# Patient Record
Sex: Male | Born: 1940 | Race: Asian | Hispanic: No | Marital: Married | Smoking: Former smoker
Health system: Southern US, Community
[De-identification: ages and names within clinical notes are randomized; demographics above are authoritative.]

## PROBLEM LIST (undated history)

## (undated) DIAGNOSIS — D649 Anemia, unspecified: Secondary | ICD-10-CM

## (undated) DIAGNOSIS — I639 Cerebral infarction, unspecified: Secondary | ICD-10-CM

## (undated) DIAGNOSIS — H548 Legal blindness, as defined in USA: Secondary | ICD-10-CM

## (undated) DIAGNOSIS — H9190 Unspecified hearing loss, unspecified ear: Secondary | ICD-10-CM

## (undated) DIAGNOSIS — F1010 Alcohol abuse, uncomplicated: Secondary | ICD-10-CM

## (undated) DIAGNOSIS — F0390 Unspecified dementia without behavioral disturbance: Secondary | ICD-10-CM

## (undated) DIAGNOSIS — E1129 Type 2 diabetes mellitus with other diabetic kidney complication: Secondary | ICD-10-CM

## (undated) DIAGNOSIS — H269 Unspecified cataract: Secondary | ICD-10-CM

## (undated) DIAGNOSIS — I10 Essential (primary) hypertension: Secondary | ICD-10-CM

## (undated) HISTORY — DX: Cerebral infarction, unspecified: I63.9

## (undated) HISTORY — DX: Type 2 diabetes mellitus with other diabetic kidney complication: E11.29

## (undated) HISTORY — DX: Anemia, unspecified: D64.9

## (undated) HISTORY — DX: Unspecified cataract: H26.9

## (undated) HISTORY — DX: Unspecified dementia, unspecified severity, without behavioral disturbance, psychotic disturbance, mood disturbance, and anxiety: F03.90

---

## 2013-11-15 ENCOUNTER — Observation Stay (HOSPITAL_COMMUNITY)
Admission: EM | Admit: 2013-11-15 | Discharge: 2013-11-16 | Disposition: A | Discharge status: Home Health | Payer: Medicare Other | Attending: General Surgery | Admitting: General Surgery

## 2013-11-15 ENCOUNTER — Encounter (HOSPITAL_COMMUNITY): Payer: Self-pay | Admitting: Emergency Medicine

## 2013-11-15 ENCOUNTER — Observation Stay (HOSPITAL_COMMUNITY): Payer: Medicare Other

## 2013-11-15 ENCOUNTER — Emergency Department (HOSPITAL_COMMUNITY): Payer: Medicare Other

## 2013-11-15 DIAGNOSIS — F1010 Alcohol abuse, uncomplicated: Secondary | ICD-10-CM | POA: Diagnosis not present

## 2013-11-15 DIAGNOSIS — S0990XA Unspecified injury of head, initial encounter: Secondary | ICD-10-CM | POA: Diagnosis present

## 2013-11-15 DIAGNOSIS — S069XAA Unspecified intracranial injury with loss of consciousness status unknown, initial encounter: Secondary | ICD-10-CM

## 2013-11-15 DIAGNOSIS — Y92009 Unspecified place in unspecified non-institutional (private) residence as the place of occurrence of the external cause: Secondary | ICD-10-CM

## 2013-11-15 DIAGNOSIS — I10 Essential (primary) hypertension: Secondary | ICD-10-CM | POA: Diagnosis not present

## 2013-11-15 DIAGNOSIS — S065X0A Traumatic subdural hemorrhage without loss of consciousness, initial encounter: Secondary | ICD-10-CM | POA: Diagnosis not present

## 2013-11-15 DIAGNOSIS — S065X9A Traumatic subdural hemorrhage with loss of consciousness of unspecified duration, initial encounter: Secondary | ICD-10-CM

## 2013-11-15 DIAGNOSIS — S065XAA Traumatic subdural hemorrhage with loss of consciousness status unknown, initial encounter: Secondary | ICD-10-CM

## 2013-11-15 DIAGNOSIS — F10129 Alcohol abuse with intoxication, unspecified: Secondary | ICD-10-CM | POA: Diagnosis present

## 2013-11-15 DIAGNOSIS — S069X9A Unspecified intracranial injury with loss of consciousness of unspecified duration, initial encounter: Secondary | ICD-10-CM

## 2013-11-15 DIAGNOSIS — W19XXXA Unspecified fall, initial encounter: Secondary | ICD-10-CM | POA: Diagnosis not present

## 2013-11-15 DIAGNOSIS — F10929 Alcohol use, unspecified with intoxication, unspecified: Secondary | ICD-10-CM

## 2013-11-15 LAB — CBC WITH DIFFERENTIAL/PLATELET
BASOS PCT: 0 % (ref 0–1)
Basophils Absolute: 0 10*3/uL (ref 0.0–0.1)
EOS PCT: 2 % (ref 0–5)
Eosinophils Absolute: 0.2 10*3/uL (ref 0.0–0.7)
HEMATOCRIT: 36.3 % — AB (ref 39.0–52.0)
HEMOGLOBIN: 12.4 g/dL — AB (ref 13.0–17.0)
Lymphocytes Relative: 18 % (ref 12–46)
Lymphs Abs: 1.5 10*3/uL (ref 0.7–4.0)
MCH: 27.1 pg (ref 26.0–34.0)
MCHC: 34.2 g/dL (ref 30.0–36.0)
MCV: 79.3 fL (ref 78.0–100.0)
MONO ABS: 0.7 10*3/uL (ref 0.1–1.0)
MONOS PCT: 8 % (ref 3–12)
Neutro Abs: 6.1 10*3/uL (ref 1.7–7.7)
Neutrophils Relative %: 72 % (ref 43–77)
Platelets: 144 10*3/uL — ABNORMAL LOW (ref 150–400)
RBC: 4.58 MIL/uL (ref 4.22–5.81)
RDW: 14.1 % (ref 11.5–15.5)
WBC: 8.5 10*3/uL (ref 4.0–10.5)

## 2013-11-15 LAB — URINALYSIS, ROUTINE W REFLEX MICROSCOPIC
Bilirubin Urine: NEGATIVE
Glucose, UA: >1000 mg/dL — AB
Ketones, ur: NEGATIVE mg/dL
NITRITE: NEGATIVE
Protein, ur: 30 mg/dL — AB
Specific Gravity, Urine: 1.011 (ref 1.005–1.030)
Urobilinogen, UA: 0.2 mg/dL (ref 0.0–1.0)
pH: 5.5 (ref 5.0–8.0)

## 2013-11-15 LAB — COMPREHENSIVE METABOLIC PANEL
ALT: 13 U/L (ref 0–53)
AST: 59 U/L — ABNORMAL HIGH (ref 0–37)
Albumin: 3.3 g/dL — ABNORMAL LOW (ref 3.5–5.2)
Alkaline Phosphatase: 145 U/L — ABNORMAL HIGH (ref 39–117)
BILIRUBIN TOTAL: 0.2 mg/dL — AB (ref 0.3–1.2)
BUN: 5 mg/dL — AB (ref 6–23)
CHLORIDE: 88 meq/L — AB (ref 96–112)
CO2: 24 mEq/L (ref 19–32)
CREATININE: 0.68 mg/dL (ref 0.50–1.35)
Calcium: 8.5 mg/dL (ref 8.4–10.5)
GFR calc Af Amer: >90 mL/min (ref >90)
GFR calc non Af Amer: >90 mL/min (ref >90)
Glucose, Bld: 258 mg/dL — ABNORMAL HIGH (ref 70–99)
Potassium: 4.3 mEq/L (ref 3.7–5.3)
Sodium: 131 mEq/L — ABNORMAL LOW (ref 137–147)
Total Protein: 8.2 g/dL (ref 6.0–8.3)

## 2013-11-15 LAB — RAPID URINE DRUG SCREEN, HOSP PERFORMED
Amphetamines: NOT DETECTED
BARBITURATES: NOT DETECTED
Benzodiazepines: NOT DETECTED
Cocaine: NOT DETECTED
Opiates: NOT DETECTED
Tetrahydrocannabinol: NOT DETECTED

## 2013-11-15 LAB — PROTIME-INR
INR: 1.03 (ref 0.00–1.49)
PROTHROMBIN TIME: 13.5 s (ref 11.6–15.2)

## 2013-11-15 LAB — URINE MICROSCOPIC-ADD ON

## 2013-11-15 LAB — ETHANOL: Alcohol, Ethyl (B): 392 mg/dL — ABNORMAL HIGH (ref 0–11)

## 2013-11-15 LAB — MRSA PCR SCREENING: MRSA by PCR: NEGATIVE

## 2013-11-15 LAB — APTT: aPTT: 32 seconds (ref 24–37)

## 2013-11-15 MED ORDER — VITAMIN B-1 100 MG PO TABS
100.0000 mg | ORAL_TABLET | Freq: Every day | ORAL | Status: DC
  Administered 2013-11-15: 100 mg via ORAL
  Filled 2013-11-15 (×2): qty 1

## 2013-11-15 MED ORDER — TETANUS-DIPHTH-ACELL PERTUSSIS 5-2.5-18.5 LF-MCG/0.5 IM SUSP
0.5000 mL | Freq: Once | INTRAMUSCULAR | Status: AC
  Administered 2013-11-15: 0.5 mL via INTRAMUSCULAR
  Filled 2013-11-15: qty 0.5

## 2013-11-15 MED ORDER — BIOTENE DRY MOUTH MT LIQD
15.0000 mL | Freq: Two times a day (BID) | OROMUCOSAL | Status: DC
  Administered 2013-11-15 – 2013-11-16 (×3): 15 mL via OROMUCOSAL

## 2013-11-15 MED ORDER — FOLIC ACID 1 MG PO TABS: 1.0000 mg | ORAL_TABLET | Freq: Every day | ORAL | Status: DC

## 2013-11-15 MED ORDER — VITAMIN B-1 100 MG PO TABS
100.0000 mg | ORAL_TABLET | Freq: Every day | ORAL | Status: DC
Start: 2013-11-15 — End: 2013-11-15
  Filled 2013-11-15: qty 1

## 2013-11-15 MED ORDER — VITAMIN B-1 100 MG PO TABS: 100.0000 mg | ORAL_TABLET | Freq: Every day | ORAL | Status: DC

## 2013-11-15 MED ORDER — LEVETIRACETAM 500 MG PO TABS
500.0000 mg | ORAL_TABLET | Freq: Two times a day (BID) | ORAL | Status: DC
  Administered 2013-11-15: 500 mg via ORAL
  Filled 2013-11-15 (×5): qty 1

## 2013-11-15 MED ORDER — ONDANSETRON HCL 4 MG PO TABS: 4.0000 mg | ORAL_TABLET | Freq: Four times a day (QID) | ORAL | Status: DC | PRN

## 2013-11-15 MED ORDER — LORAZEPAM 1 MG PO TABS
0.0000 mg | ORAL_TABLET | Freq: Four times a day (QID) | ORAL | Status: DC
  Administered 2013-11-15: 1 mg via ORAL
  Filled 2013-11-15: qty 1

## 2013-11-15 MED ORDER — ADULT MULTIVITAMIN W/MINERALS CH
1.0000 | ORAL_TABLET | Freq: Every day | ORAL | Status: DC
Start: 2013-11-15 — End: 2013-11-15

## 2013-11-15 MED ORDER — ADULT MULTIVITAMIN W/MINERALS CH
1.0000 | ORAL_TABLET | Freq: Every day | ORAL | Status: DC
  Administered 2013-11-15: 1 via ORAL
  Filled 2013-11-15 (×2): qty 1

## 2013-11-15 MED ORDER — CHLORHEXIDINE GLUCONATE 0.12 % MT SOLN
15.0000 mL | Freq: Two times a day (BID) | OROMUCOSAL | Status: DC
  Administered 2013-11-15 – 2013-11-16 (×2): 15 mL via OROMUCOSAL
  Filled 2013-11-15 (×2): qty 15

## 2013-11-15 MED ORDER — ADULT MULTIVITAMIN W/MINERALS CH: 1.0000 | ORAL_TABLET | Freq: Every day | ORAL | Status: DC

## 2013-11-15 MED ORDER — THIAMINE HCL 100 MG/ML IJ SOLN
100.0000 mg | Freq: Every day | INTRAMUSCULAR | Status: DC
  Administered 2013-11-16: 100 mg via INTRAVENOUS
  Filled 2013-11-15: qty 1
  Filled 2013-11-15: qty 2

## 2013-11-15 MED ORDER — FOLIC ACID 1 MG PO TABS
1.0000 mg | ORAL_TABLET | Freq: Every day | ORAL | Status: DC
  Administered 2013-11-15: 1 mg via ORAL
  Filled 2013-11-15 (×2): qty 1

## 2013-11-15 MED ORDER — LORAZEPAM 1 MG PO TABS: 0.0000 mg | ORAL_TABLET | Freq: Two times a day (BID) | ORAL | Status: DC

## 2013-11-15 MED ORDER — LORAZEPAM 2 MG/ML IJ SOLN: 1.0000 mg | INTRAMUSCULAR | Status: DC | PRN

## 2013-11-15 MED ORDER — DEXTROSE-NACL 5-0.9 % IV SOLN
INTRAVENOUS | Status: DC
  Administered 2013-11-15: 04:00:00 via INTRAVENOUS
  Administered 2013-11-15: 100 mL/h via INTRAVENOUS
  Administered 2013-11-16: via INTRAVENOUS

## 2013-11-15 MED ORDER — LORAZEPAM 1 MG PO TABS
1.0000 mg | ORAL_TABLET | Freq: Four times a day (QID) | ORAL | Status: DC | PRN
  Administered 2013-11-16: 1 mg via ORAL
  Filled 2013-11-15: qty 1

## 2013-11-15 MED ORDER — FOLIC ACID 1 MG PO TABS
1.0000 mg | ORAL_TABLET | Freq: Every day | ORAL | Status: DC
  Filled 2013-11-15: qty 1

## 2013-11-15 MED ORDER — LORAZEPAM 2 MG/ML IJ SOLN
1.0000 mg | Freq: Four times a day (QID) | INTRAMUSCULAR | Status: DC | PRN
  Administered 2013-11-15: 0.5 mg via INTRAVENOUS
  Filled 2013-11-15: qty 1

## 2013-11-15 MED ORDER — LORAZEPAM 2 MG/ML IJ SOLN
1.0000 mg | INTRAMUSCULAR | Status: DC | PRN
Start: 2013-11-15 — End: 2013-11-16

## 2013-11-15 MED ORDER — ONDANSETRON HCL 4 MG/2ML IJ SOLN: 4.0000 mg | Freq: Four times a day (QID) | INTRAMUSCULAR | Status: DC | PRN

## 2013-11-15 NOTE — Progress Notes (Signed)
Inpatient Diabetes Program Recommendations  AACE/ADA: New Consensus Statement on Inpatient Glycemic Control (2013)  Target Ranges:  Prepandial:   less than 140 mg/dL      Peak postprandial:   less than 180 mg/dL (1-2 hours)      Critically ill patients:  140 - 180 mg/dL  Results for TRUE, COWDIN (MRN [id number]) as of 11/15/2013 11:21  Ref. Range 11/15/2013 01:59  Glucose Latest Range: 70-99 mg/dL 258 (H)   Consider monitoring CBGs achs since lab glucose was elevated. Thank you  Raoul Pitch BSN, RN,CDE Inpatient Diabetes Coordinator [phone] (team pager)

## 2013-11-15 NOTE — Progress Notes (Signed)
Trauma Service Note  Subjective: Patient is conversant and seems to understand English pretty well.  Says he is not hungry or thirsty.  No headache.  No neck pain.  Objective: Vital signs in last 24 hours: Temp:  [97.2 F (36.2 C)-98.1 F (36.7 C)] 98.1 F (36.7 C) (06/30 0500) Pulse Rate:  [89-107] 98 (06/30 0700) Resp:  [14-20] 20 (06/30 0700) BP: (136-167)/(73-88) 167/88 mmHg (06/30 0700) SpO2:  [99 %-100 %] 100 % (06/30 0700) Weight:  [42.4 kg (93 lb 7.6 oz)] 42.4 kg (93 lb 7.6 oz) (06/30 0500)    Intake/Output from previous day: 06/29 0701 - 06/30 0700 In: 271.7 [I.V.:271.7] Out: 500 [Urine:500] Intake/Output this shift:    General: No acute distress.  Jittery, bloodshot eyes.  Lungs: Clear  Abd: Benign  Extremities: No clinical signs or symptoms of DVT  Neuro: Intact, jittery.  Lab Results: CBC   Recent Labs  11/15/13 0159  WBC 8.5  HGB 12.4*  HCT 36.3*  PLT 144*   BMET  Recent Labs  11/15/13 0159  NA 131*  K 4.3  CL 88*  CO2 24  GLUCOSE 258*  BUN 5*  CREATININE 0.68  CALCIUM 8.5   PT/INR  Recent Labs  11/15/13 0159  LABPROT 13.5  INR 1.03   ABG No results found for this basename: PHART, PCO2, PO2, HCO3,  in the last 72 hours  Studies/Results: Ct Head Wo Contrast  11/15/2013   CLINICAL DATA:  Fall.  EXAM: CT HEAD WITHOUT CONTRAST  CT CERVICAL SPINE WITHOUT CONTRAST  TECHNIQUE: Multidetector CT imaging of the head and cervical spine was performed following the standard protocol without intravenous contrast. Multiplanar CT image reconstructions of the cervical spine were also generated.  COMPARISON:  None.  FINDINGS: CT HEAD FINDINGS  Skull and Sinuses:There is a large left frontal scalp hematoma. No associated fracture. Mild inflammatory mucosal thickening, best seen in the posterior left maxillary antrum.  Orbits: No acute abnormality.  Brain: There is extra-axial hematoma along the mid right cerebral hemisphere. Small area of central low  density could reflect admixed CSF or serum. The hematoma is relatively small in diameter, up to 8 mm in thickness. There is no significant mass effect on the neighboring brain. There is a fine, thin high-density crescent at the level of the right external capsule. This would be an unusual morphology for hemorrhage, suspect calcification instead.  There is generalized brain atrophy with ex vacuo ventricular enlargement. No evidence of acute infarct, hydrocephalus, or mass lesion.  CT CERVICAL SPINE FINDINGS  Negative for acute fracture or subluxation. No prevertebral edema. No gross cervical canal hematoma. No significant osseous canal or foraminal stenosis. There is a 3 mm right apical pulmonary nodule which is considered incidental in this never smoker (per the electronic medical record).  Critical Value/emergent results were called by telephone at the time of interpretation on 11/15/2013 at 2:36 AM to Dr. Julianne Rice , who verbally acknowledged these results.  IMPRESSION: 1. Small right cerebral convexity subdural hematoma, 8 mm in maximal thickness. 2.  No evidence of acute cervical spine injury. 3. Brain atrophy.   Electronically Signed   By: Jorje Guild M.D.   On: 11/15/2013 02:38   Ct Cervical Spine Wo Contrast  11/15/2013   CLINICAL DATA:  Fall.  EXAM: CT HEAD WITHOUT CONTRAST  CT CERVICAL SPINE WITHOUT CONTRAST  TECHNIQUE: Multidetector CT imaging of the head and cervical spine was performed following the standard protocol without intravenous contrast. Multiplanar CT image reconstructions of  the cervical spine were also generated.  COMPARISON:  None.  FINDINGS: CT HEAD FINDINGS  Skull and Sinuses:There is a large left frontal scalp hematoma. No associated fracture. Mild inflammatory mucosal thickening, best seen in the posterior left maxillary antrum.  Orbits: No acute abnormality.  Brain: There is extra-axial hematoma along the mid right cerebral hemisphere. Small area of central low density could  reflect admixed CSF or serum. The hematoma is relatively small in diameter, up to 8 mm in thickness. There is no significant mass effect on the neighboring brain. There is a fine, thin high-density crescent at the level of the right external capsule. This would be an unusual morphology for hemorrhage, suspect calcification instead.  There is generalized brain atrophy with ex vacuo ventricular enlargement. No evidence of acute infarct, hydrocephalus, or mass lesion.  CT CERVICAL SPINE FINDINGS  Negative for acute fracture or subluxation. No prevertebral edema. No gross cervical canal hematoma. No significant osseous canal or foraminal stenosis. There is a 3 mm right apical pulmonary nodule which is considered incidental in this never smoker (per the electronic medical record).  Critical Value/emergent results were called by telephone at the time of interpretation on 11/15/2013 at 2:36 AM to Dr. Julianne Rice , who verbally acknowledged these results.  IMPRESSION: 1. Small right cerebral convexity subdural hematoma, 8 mm in maximal thickness. 2.  No evidence of acute cervical spine injury. 3. Brain atrophy.   Electronically Signed   By: Jorje Guild M.D.   On: 11/15/2013 02:38    Anti-infectives: Anti-infectives   None      Assessment/Plan: s/p  Advance diet Repeat CT head at about 1300. CIWA protocol.  LOS: 0 days   Kathryne Eriksson. Dahlia Bailiff, MD, FACS [phone] Trauma Surgeon 11/15/2013

## 2013-11-15 NOTE — Progress Notes (Signed)
Paged trauma Dr. Ninfa Linden concerning patient vomiting after 2200 Keppra given. Patient appears to be experiencing withdrawal symptoms with vomiting and tremors. Orders to give only 0.5mg  per nurse explaining to MD that patient was extremely  per previous nurse with 1 mg of ativan. Also additional dose of Keppra will be given since patient vomited per order.

## 2013-11-15 NOTE — ED Provider Notes (Signed)
CSN: [id number]     Arrival date & time 11/15/13  0106 History   First MD Initiated Contact with Patient 11/15/13 0112     Chief Complaint  Patient presents with  . Fall    The patient has been drinking, drinks daily.  He fell and advised he has hit his head. EMS was not able to see a wound due to the hair being matted with blood, however they wrapped the wound in gauze.      (Consider location/radiation/quality/duration/timing/severity/associated sxs/prior Treatment) HPI Patient is brought in by EMS after unwitnessed ground-level fall. Patient is unable to contribute to the history do to acute alcohol intoxication. His grandson is at bedside. He states that the patient drinks alcohol heavily. He was found lying on the ground. Patient denied loss of consciousness to paramedics. Unknown last tetanus. History reviewed. No pertinent past medical history. History reviewed. No pertinent past surgical history. History reviewed. No pertinent family history. History  Substance Use Topics  . Smoking status: Never Smoker   . Smokeless tobacco: Not on file  . Alcohol Use: Yes     Comment: UTA he drinks "alot" according to grandson    Review of Systems  Cardiovascular: Negative for chest pain.  Gastrointestinal: Negative for abdominal pain.  Musculoskeletal: Negative for neck pain.  Skin: Positive for wound.  Neurological: Positive for headaches. Negative for syncope and weakness.  All other systems reviewed and are negative.     Allergies  Review of patient's allergies indicates not on file.  Home Medications   Prior to Admission medications   Not on File   BP 152/73  Pulse 98  Resp 16  SpO2 100% Physical Exam  Nursing note and vitals reviewed. Constitutional: He appears well-developed and well-nourished. No distress.  Disheveled appearing. Strong scent of alcohol and urine.  HENT:  Head: Normocephalic.  Mouth/Throat: Oropharynx is clear and moist.  Large left forehead  hematoma with some oozing  Eyes: EOM are normal. Pupils are equal, round, and reactive to light.  Neck: Normal range of motion. Neck supple.  No posterior midline cervical tenderness. Patient placed in a cervical collar in the emergency department.  Cardiovascular: Normal rate and regular rhythm.   Pulmonary/Chest: Effort normal and breath sounds normal. No respiratory distress. He has no wheezes. He has no rales. He exhibits no tenderness.  Abdominal: Soft. Bowel sounds are normal. He exhibits no distension and no mass. There is no tenderness. There is no rebound and no guarding.  Musculoskeletal: Normal range of motion. He exhibits no edema and no tenderness.  No thoracic or lumbar tenderness to palpation. Full range of motion of both hips.  Neurological: He is alert.  5/5 motor in all extremities. Sensation is intact. Patient is non-English speaking but has slurred speech  Skin: Skin is warm and dry. No rash noted. No erythema.    ED Course  Procedures (including critical care time) Labs Review Labs Reviewed  CBC WITH DIFFERENTIAL - Abnormal; Notable for the following:    Hemoglobin 12.4 (*)    HCT 36.3 (*)    Platelets 144 (*)    All other components within normal limits  COMPREHENSIVE METABOLIC PANEL - Abnormal; Notable for the following:    Sodium 131 (*)    Chloride 88 (*)    Glucose, Bld 258 (*)    BUN 5 (*)    Albumin 3.3 (*)    AST 59 (*)    Alkaline Phosphatase 145 (*)    Total Bilirubin 0.2 (*)  All other components within normal limits  ETHANOL - Abnormal; Notable for the following:    Alcohol, Ethyl (B) 392 (*)    All other components within normal limits  URINALYSIS, ROUTINE W REFLEX MICROSCOPIC - Abnormal; Notable for the following:    Glucose, UA >1000 (*)    Hgb urine dipstick MODERATE (*)    Protein, ur 30 (*)    Leukocytes, UA TRACE (*)    All other components within normal limits  PROTIME-INR  APTT  URINE MICROSCOPIC-ADD ON  URINE RAPID DRUG SCREEN  (HOSP PERFORMED)    Imaging Review Ct Head Wo Contrast  11/15/2013   CLINICAL DATA:  Fall.  EXAM: CT HEAD WITHOUT CONTRAST  CT CERVICAL SPINE WITHOUT CONTRAST  TECHNIQUE: Multidetector CT imaging of the head and cervical spine was performed following the standard protocol without intravenous contrast. Multiplanar CT image reconstructions of the cervical spine were also generated.  COMPARISON:  None.  FINDINGS: CT HEAD FINDINGS  Skull and Sinuses:There is a large left frontal scalp hematoma. No associated fracture. Mild inflammatory mucosal thickening, best seen in the posterior left maxillary antrum.  Orbits: No acute abnormality.  Brain: There is extra-axial hematoma along the mid right cerebral hemisphere. Small area of central low density could reflect admixed CSF or serum. The hematoma is relatively small in diameter, up to 8 mm in thickness. There is no significant mass effect on the neighboring brain. There is a fine, thin high-density crescent at the level of the right external capsule. This would be an unusual morphology for hemorrhage, suspect calcification instead.  There is generalized brain atrophy with ex vacuo ventricular enlargement. No evidence of acute infarct, hydrocephalus, or mass lesion.  CT CERVICAL SPINE FINDINGS  Negative for acute fracture or subluxation. No prevertebral edema. No gross cervical canal hematoma. No significant osseous canal or foraminal stenosis. There is a 3 mm right apical pulmonary nodule which is considered incidental in this never smoker (per the electronic medical record).  Critical Value/emergent results were called by telephone at the time of interpretation on 11/15/2013 at 2:36 AM to Dr. Julianne Rice , who verbally acknowledged these results.  IMPRESSION: 1. Small right cerebral convexity subdural hematoma, 8 mm in maximal thickness. 2.  No evidence of acute cervical spine injury. 3. Brain atrophy.   Electronically Signed   By: Jorje Guild M.D.   On:  11/15/2013 02:38   Ct Cervical Spine Wo Contrast  11/15/2013   CLINICAL DATA:  Fall.  EXAM: CT HEAD WITHOUT CONTRAST  CT CERVICAL SPINE WITHOUT CONTRAST  TECHNIQUE: Multidetector CT imaging of the head and cervical spine was performed following the standard protocol without intravenous contrast. Multiplanar CT image reconstructions of the cervical spine were also generated.  COMPARISON:  None.  FINDINGS: CT HEAD FINDINGS  Skull and Sinuses:There is a large left frontal scalp hematoma. No associated fracture. Mild inflammatory mucosal thickening, best seen in the posterior left maxillary antrum.  Orbits: No acute abnormality.  Brain: There is extra-axial hematoma along the mid right cerebral hemisphere. Small area of central low density could reflect admixed CSF or serum. The hematoma is relatively small in diameter, up to 8 mm in thickness. There is no significant mass effect on the neighboring brain. There is a fine, thin high-density crescent at the level of the right external capsule. This would be an unusual morphology for hemorrhage, suspect calcification instead.  There is generalized brain atrophy with ex vacuo ventricular enlargement. No evidence of acute infarct, hydrocephalus, or mass  lesion.  CT CERVICAL SPINE FINDINGS  Negative for acute fracture or subluxation. No prevertebral edema. No gross cervical canal hematoma. No significant osseous canal or foraminal stenosis. There is a 3 mm right apical pulmonary nodule which is considered incidental in this never smoker (per the electronic medical record).  Critical Value/emergent results were called by telephone at the time of interpretation on 11/15/2013 at 2:36 AM to Dr. Julianne Rice , who verbally acknowledged these results.  IMPRESSION: 1. Small right cerebral convexity subdural hematoma, 8 mm in maximal thickness. 2.  No evidence of acute cervical spine injury. 3. Brain atrophy.   Electronically Signed   By: Jorje Guild M.D.   On: 11/15/2013  02:38     EKG Interpretation   Date/Time:  Tuesday November 15 2013 03:02:24 EDT Ventricular Rate:  99 PR Interval:  155 QRS Duration: 94 QT Interval:  339 QTC Calculation: 435 R Axis:   85 Text Interpretation:  Sinus rhythm Borderline right axis deviation Left  ventricular hypertrophy Confirmed by Lita Mains  MD, DAVID ([id number]) on  11/15/2013 3:29:33 AM      MDM   Final diagnoses:  None    I discussed the findings with the patient's CT with neurosurgery. Recommend observation admit to the trauma service and repeat CT head. Patient remains neurologically stable in the emergency department. Trauma surgery to see and admit.    Julianne Rice, MD 11/15/13 [phone]

## 2013-11-15 NOTE — H&P (Signed)
History   Timothy [name] is an 73 y.o. male.   Chief Complaint:  Chief Complaint  Patient presents with  . Fall    The patient has been drinking, drinks daily.  He fell and advised he has hit his head. EMS was not able to see a wound due to the hair being matted with blood, however they wrapped the wound in gauze.     Fall The current episode started today. Associated symptoms include headaches. Pertinent negatives include no abdominal pain, chest pain, coughing, myalgias, nausea, neck pain or vomiting. The symptoms are aggravated by drinking.   Pt speaks [place] and two grandsons are able to translate in the room.  History reviewed. No pertinent past medical history.  History reviewed. No pertinent past surgical history.  History reviewed. No pertinent family history. Social History:  reports that he has never smoked. He does not have any smokeless tobacco history on file. He reports that he drinks alcohol. He reports that he does not use illicit drugs.  Allergies  No Known Allergies  Home Medications   (Not in a hospital admission)  Trauma Course   Results for orders placed during the hospital encounter of 11/15/13 (from the past 48 hour(s))  CBC WITH DIFFERENTIAL     Status: Abnormal   Collection Time    11/15/13  1:59 AM      Result Value Ref Range   WBC 8.5  4.0 - 10.5 K/uL   RBC 4.58  4.22 - 5.81 MIL/uL   Hemoglobin 12.4 (*) 13.0 - 17.0 g/dL   HCT 36.3 (*) 39.0 - 52.0 %   MCV 79.3  78.0 - 100.0 fL   MCH 27.1  26.0 - 34.0 pg   MCHC 34.2  30.0 - 36.0 g/dL   RDW 14.1  11.5 - 15.5 %   Platelets 144 (*) 150 - 400 K/uL   Neutrophils Relative % 72  43 - 77 %   Neutro Abs 6.1  1.7 - 7.7 K/uL   Lymphocytes Relative 18  12 - 46 %   Lymphs Abs 1.5  0.7 - 4.0 K/uL   Monocytes Relative 8  3 - 12 %   Monocytes Absolute 0.7  0.1 - 1.0 K/uL   Eosinophils Relative 2  0 - 5 %   Eosinophils Absolute 0.2  0.0 - 0.7 K/uL   Basophils Relative 0  0 - 1 %   Basophils Absolute 0.0   0.0 - 0.1 K/uL  COMPREHENSIVE METABOLIC PANEL     Status: Abnormal   Collection Time    11/15/13  1:59 AM      Result Value Ref Range   Sodium 131 (*) 137 - 147 mEq/L   Potassium 4.3  3.7 - 5.3 mEq/L   Chloride 88 (*) 96 - 112 mEq/L   CO2 24  19 - 32 mEq/L   Glucose, Bld 258 (*) 70 - 99 mg/dL   BUN 5 (*) 6 - 23 mg/dL   Creatinine, Ser 0.68  0.50 - 1.35 mg/dL   Calcium 8.5  8.4 - 10.5 mg/dL   Total Protein 8.2  6.0 - 8.3 g/dL   Albumin 3.3 (*) 3.5 - 5.2 g/dL   AST 59 (*) 0 - 37 U/L   ALT 13  0 - 53 U/L   Alkaline Phosphatase 145 (*) 39 - 117 U/L   Total Bilirubin 0.2 (*) 0.3 - 1.2 mg/dL   GFR calc non Af Amer >90  >90 mL/min   GFR calc Af  Amer >90  >90 mL/min   Comment: (NOTE)     The eGFR has been calculated using the CKD EPI equation.     This calculation has not been validated in all clinical situations.     eGFR's persistently <90 mL/min signify possible Chronic Kidney     Disease.  PROTIME-INR     Status: None   Collection Time    11/15/13  1:59 AM      Result Value Ref Range   Prothrombin Time 13.5  11.6 - 15.2 seconds   INR 1.03  0.00 - 1.49  ETHANOL     Status: Abnormal   Collection Time    11/15/13  1:59 AM      Result Value Ref Range   Alcohol, Ethyl (B) 392 (*) 0 - 11 mg/dL   Comment:            LOWEST DETECTABLE LIMIT FOR     SERUM ALCOHOL IS 11 mg/dL     FOR MEDICAL PURPOSES ONLY  APTT     Status: None   Collection Time    11/15/13  1:59 AM      Result Value Ref Range   aPTT 32  24 - 37 seconds  URINE RAPID DRUG SCREEN (HOSP PERFORMED)     Status: None   Collection Time    11/15/13  2:30 AM      Result Value Ref Range   Opiates NONE DETECTED  NONE DETECTED   Cocaine NONE DETECTED  NONE DETECTED   Benzodiazepines NONE DETECTED  NONE DETECTED   Amphetamines NONE DETECTED  NONE DETECTED   Tetrahydrocannabinol NONE DETECTED  NONE DETECTED   Barbiturates NONE DETECTED  NONE DETECTED   Comment:            DRUG SCREEN FOR MEDICAL PURPOSES     ONLY.  IF  CONFIRMATION IS NEEDED     FOR ANY PURPOSE, NOTIFY LAB     WITHIN 5 DAYS.                LOWEST DETECTABLE LIMITS     FOR URINE DRUG SCREEN     Drug Class       Cutoff (ng/mL)     Amphetamine      1000     Barbiturate      200     Benzodiazepine   287     Tricyclics       681     Opiates          300     Cocaine          300     THC              50  URINALYSIS, ROUTINE W REFLEX MICROSCOPIC     Status: Abnormal   Collection Time    11/15/13  2:31 AM      Result Value Ref Range   Color, Urine YELLOW  YELLOW   APPearance CLEAR  CLEAR   Specific Gravity, Urine 1.011  1.005 - 1.030   pH 5.5  5.0 - 8.0   Glucose, UA >1000 (*) NEGATIVE mg/dL   Hgb urine dipstick MODERATE (*) NEGATIVE   Bilirubin Urine NEGATIVE  NEGATIVE   Ketones, ur NEGATIVE  NEGATIVE mg/dL   Protein, ur 30 (*) NEGATIVE mg/dL   Urobilinogen, UA 0.2  0.0 - 1.0 mg/dL   Nitrite NEGATIVE  NEGATIVE   Leukocytes, UA TRACE (*) NEGATIVE  URINE MICROSCOPIC-ADD ON  Status: None   Collection Time    11/15/13  2:31 AM      Result Value Ref Range   Squamous Epithelial / LPF RARE  RARE   WBC, UA 7-10  <3 WBC/hpf   RBC / HPF 0-2  <3 RBC/hpf   Bacteria, UA RARE  RARE   Ct Head Wo Contrast  11/15/2013   CLINICAL DATA:  Fall.  EXAM: CT HEAD WITHOUT CONTRAST  CT CERVICAL SPINE WITHOUT CONTRAST  TECHNIQUE: Multidetector CT imaging of the head and cervical spine was performed following the standard protocol without intravenous contrast. Multiplanar CT image reconstructions of the cervical spine were also generated.  COMPARISON:  None.  FINDINGS: CT HEAD FINDINGS  Skull and Sinuses:There is a large left frontal scalp hematoma. No associated fracture. Mild inflammatory mucosal thickening, best seen in the posterior left maxillary antrum.  Orbits: No acute abnormality.  Brain: There is extra-axial hematoma along the mid right cerebral hemisphere. Small area of central low density could reflect admixed CSF or serum. The hematoma is  relatively small in diameter, up to 8 mm in thickness. There is no significant mass effect on the neighboring brain. There is a fine, thin high-density crescent at the level of the right external capsule. This would be an unusual morphology for hemorrhage, suspect calcification instead.  There is generalized brain atrophy with ex vacuo ventricular enlargement. No evidence of acute infarct, hydrocephalus, or mass lesion.  CT CERVICAL SPINE FINDINGS  Negative for acute fracture or subluxation. No prevertebral edema. No gross cervical canal hematoma. No significant osseous canal or foraminal stenosis. There is a 3 mm right apical pulmonary nodule which is considered incidental in this never smoker (per the electronic medical record).  Critical Value/emergent results were called by telephone at the time of interpretation on 11/15/2013 at 2:36 AM to Dr. Julianne Rice , who verbally acknowledged these results.  IMPRESSION: 1. Small right cerebral convexity subdural hematoma, 8 mm in maximal thickness. 2.  No evidence of acute cervical spine injury. 3. Brain atrophy.   Electronically Signed   By: Jorje Guild M.D.   On: 11/15/2013 02:38   Ct Cervical Spine Wo Contrast  11/15/2013   CLINICAL DATA:  Fall.  EXAM: CT HEAD WITHOUT CONTRAST  CT CERVICAL SPINE WITHOUT CONTRAST  TECHNIQUE: Multidetector CT imaging of the head and cervical spine was performed following the standard protocol without intravenous contrast. Multiplanar CT image reconstructions of the cervical spine were also generated.  COMPARISON:  None.  FINDINGS: CT HEAD FINDINGS  Skull and Sinuses:There is a large left frontal scalp hematoma. No associated fracture. Mild inflammatory mucosal thickening, best seen in the posterior left maxillary antrum.  Orbits: No acute abnormality.  Brain: There is extra-axial hematoma along the mid right cerebral hemisphere. Small area of central low density could reflect admixed CSF or serum. The hematoma is relatively  small in diameter, up to 8 mm in thickness. There is no significant mass effect on the neighboring brain. There is a fine, thin high-density crescent at the level of the right external capsule. This would be an unusual morphology for hemorrhage, suspect calcification instead.  There is generalized brain atrophy with ex vacuo ventricular enlargement. No evidence of acute infarct, hydrocephalus, or mass lesion.  CT CERVICAL SPINE FINDINGS  Negative for acute fracture or subluxation. No prevertebral edema. No gross cervical canal hematoma. No significant osseous canal or foraminal stenosis. There is a 3 mm right apical pulmonary nodule which is considered incidental in this never  smoker (per the electronic medical record).  Critical Value/emergent results were called by telephone at the time of interpretation on 11/15/2013 at 2:36 AM to Dr. Julianne Rice , who verbally acknowledged these results.  IMPRESSION: 1. Small right cerebral convexity subdural hematoma, 8 mm in maximal thickness. 2.  No evidence of acute cervical spine injury. 3. Brain atrophy.   Electronically Signed   By: Jorje Guild M.D.   On: 11/15/2013 02:38    Review of Systems  Constitutional: Negative for weight loss.  HENT: Negative for ear discharge, ear pain, hearing loss and tinnitus.   Eyes: Negative for blurred vision, double vision, photophobia and pain.  Respiratory: Negative for cough, sputum production and shortness of breath.   Cardiovascular: Negative for chest pain.  Gastrointestinal: Negative for nausea, vomiting and abdominal pain.  Genitourinary: Negative for dysuria, urgency, frequency and flank pain.  Musculoskeletal: Negative for back pain, falls, joint pain, myalgias and neck pain.  Neurological: Positive for headaches. Negative for dizziness, tingling, sensory change, focal weakness and loss of consciousness.  Endo/Heme/Allergies: Does not bruise/bleed easily.  Psychiatric/Behavioral: Negative for depression,  memory loss and substance abuse. The patient is not nervous/anxious.     Blood pressure 152/73, pulse 98, resp. rate 16, SpO2 100.00%. Physical Exam  Vitals reviewed. Constitutional: He is oriented to person, place, and time. He appears well-developed and well-nourished. He is cooperative. No distress. Cervical collar and nasal cannula in place.  HENT:  Head: Normocephalic and atraumatic. Head is without raccoon's eyes, without Battle's sign, without abrasion, without contusion and without laceration.    Right Ear: Hearing, tympanic membrane, external ear and ear canal normal. No lacerations. No drainage or tenderness. No foreign bodies. Tympanic membrane is not perforated. No hemotympanum.  Left Ear: Hearing, tympanic membrane, external ear and ear canal normal. No lacerations. No drainage or tenderness. No foreign bodies. Tympanic membrane is not perforated. No hemotympanum.  Nose: Nose normal. No nose lacerations, sinus tenderness, nasal deformity or nasal septal hematoma. No epistaxis.  Mouth/Throat: Uvula is midline, oropharynx is clear and moist and mucous membranes are normal. No lacerations.  Eyes: Conjunctivae, EOM and lids are normal. Pupils are equal, round, and reactive to light. No scleral icterus.  Neck: Trachea normal and normal range of motion. Neck supple. No JVD present. No spinous process tenderness and no muscular tenderness present. Carotid bruit is not present. No thyromegaly present.  Cardiovascular: Normal rate, regular rhythm, normal heart sounds, intact distal pulses and normal pulses.   Respiratory: Effort normal and breath sounds normal. No respiratory distress. He exhibits no tenderness, no bony tenderness, no laceration and no crepitus.  GI: Soft. Normal appearance. He exhibits no distension. Bowel sounds are decreased. There is no tenderness. There is no rigidity, no rebound, no guarding and no CVA tenderness.  Musculoskeletal: Normal range of motion. He exhibits no  edema and no tenderness.  Lymphadenopathy:    He has no cervical adenopathy.  Neurological: He is alert and oriented to person, place, and time. He has normal strength. No cranial nerve deficit or sensory deficit. GCS eye subscore is 4. GCS verbal subscore is 5. GCS motor subscore is 6.  Skin: Skin is warm, dry and intact. He is not diaphoretic.  Psychiatric: He has a normal mood and affect. His speech is normal and behavior is normal.     Assessment/Plan 73 y/o M s/p SLF secondary to EtOH.  Pt with SDH on CT.   Dr. Kathyrn Sheriff recommends OBS and repeat CT Will Admit with  CIWA protocol and defer any other orders to NSR. Con't C-collar  Timothy Jacks., Timothy [name] 11/15/2013, 3:57 AM   Procedures

## 2013-11-15 NOTE — Progress Notes (Signed)
Report called to [facility] on 4n12 . Pt transferred with family at bedside and message left for pt's grandson of room change

## 2013-11-15 NOTE — Consult Note (Addendum)
CC:  Chief Complaint  Patient presents with  . Fall    The patient has been drinking, drinks daily.  He fell and advised he has hit his head. EMS was not able to see a wound due to the hair being matted with blood, however they wrapped the wound in gauze.     HPI: Timothy [name] is a 73 y.o. male whose history is obtained from the EMR and ED personnel as the patient cannot provide significant history due to language barrier. He was apparently intoxicated at home, got up from bed and fell, hitting the left side of his head. Currently he does not c/o HA, visual changes, N/T/W. He apparently did not lose consciousness.  PMH: History reviewed. No pertinent past medical history.  PSH: History reviewed. No pertinent past surgical history.  SH: History  Substance Use Topics  . Smoking status: Never Smoker   . Smokeless tobacco: Not on file  . Alcohol Use: Yes     Comment: UTA he drinks "alot" according to grandson    MEDS: Prior to Admission medications   Not on File    ALLERGY: No Known Allergies  ROS: ROS  NEUROLOGIC EXAM: Awake, alert, oriented Speech fluent, appropriate CN grossly intact Motor exam: Upper Extremities Deltoid Bicep Tricep Grip  Right 5/5 5/5 5/5 5/5  Left 5/5 5/5 5/5 5/5   Lower Extremity IP Quad PF DF EHL  Right 5/5 5/5 5/5 5/5 5/5  Left 5/5 5/5 5/5 5/5 5/5   Sensation grossly intact to LT  [facility]: CT head demonstrates a small right temporal hematoma, possibly extradural without significant local mass effect. No MLS. No HCP. No skull fractures.  Cspine CT without fracture or subluxation  IMPRESSION: - 73 y.o. male s/p fall with small right temporal hematoma, possible EDH, neurologically intact  PLAN: - Repeat CTH this pm - 7 days Keppra for early post-traumatic SZ prophylaxis

## 2013-11-15 NOTE — ED Notes (Signed)
The patient has been drinking, drinks daily.  He fell and advised he has hit his head. EMS was not able to see a wound due to the hair being matted with blood, however they wrapped the wound in gauze.   EMS advised he denies, LOC, GCS=15, he is following commands.  He is complaining of pain unable to rate the pain.  Yolanda Bonine is at the bedside and he does speak [place].  The patient was transported here to be evaluated.

## 2013-11-16 DIAGNOSIS — W19XXXA Unspecified fall, initial encounter: Secondary | ICD-10-CM

## 2013-11-16 DIAGNOSIS — Y92009 Unspecified place in unspecified non-institutional (private) residence as the place of occurrence of the external cause: Secondary | ICD-10-CM

## 2013-11-16 DIAGNOSIS — S069XAA Unspecified intracranial injury with loss of consciousness status unknown, initial encounter: Secondary | ICD-10-CM

## 2013-11-16 DIAGNOSIS — F10129 Alcohol abuse with intoxication, unspecified: Secondary | ICD-10-CM | POA: Diagnosis present

## 2013-11-16 DIAGNOSIS — S069X9A Unspecified intracranial injury with loss of consciousness of unspecified duration, initial encounter: Secondary | ICD-10-CM

## 2013-11-16 MED ORDER — FOLIC ACID 1 MG PO TABS: 1.0000 mg | ORAL_TABLET | Freq: Every day | ORAL | Status: DC

## 2013-11-16 MED ORDER — THIAMINE HCL 100 MG PO TABS: 100.0000 mg | ORAL_TABLET | Freq: Every day | ORAL | Status: DC

## 2013-11-16 MED ORDER — ADULT MULTIVITAMIN W/MINERALS CH: 1.0000 | ORAL_TABLET | Freq: Every day | ORAL | Status: DC

## 2013-11-16 MED ORDER — LEVETIRACETAM 100 MG/ML PO SOLN: 500.0000 mg | Freq: Two times a day (BID) | ORAL | Status: DC

## 2013-11-16 MED ORDER — LEVETIRACETAM 100 MG/ML PO SOLN
500.0000 mg | Freq: Two times a day (BID) | ORAL | Status: DC
  Administered 2013-11-16: 500 mg via ORAL
  Filled 2013-11-16: qty 5

## 2013-11-16 NOTE — Discharge Summary (Signed)
[facility] Trauma Service Discharge Summary   Patient ID: Timothy [name] MRN: [id number] DOB/AGE: 12/14/1940 73 y.o.  Admit date: 11/15/2013 Discharge date: 11/16/2013  Discharge Diagnoses Patient Active Problem List   Diagnosis Date Noted  . Fall at home 11/16/2013  . Alcohol abuse with intoxication 11/16/2013  . Closed TBI (traumatic brain injury) 11/16/2013  . SDH (subdural hematoma) 11/15/2013    Consultants Dr. Kathyrn Sheriff (Neurosurgery)  Procedures None  Hospital Course:  73 y/o asian male presents to [facility] after a fall.  He was found down on the ground.  Denied LOC to paramedics.  Unknown last tetanus.  He reportedly by EMS had been drinking and hit his head.  Hair was matted with blood and bandaged on arrival to the ED.  He c/o headaches, but no abdominal or chest pain, coughing, myalgia, nausea/vomiting or neck pain.  The patient speaks only [place].  Reportedly drinks alcohol daily.  Workup showed small right temporal hematoma, SDH, possible EDH, but neurologically intact.    Patient was admitted and was transferred to the floor for close monitoring.  Dr. Kathyrn Sheriff recommended repeat CT which was stable/improved.  He recommended discharge with 7 days of Keppra for early post-traumatic seizure prophylaxis.  He apparently does not take pills, so we switched his keppra to liquid formulation which he will need to continue for 7 days at discharge.  Diet was advanced as tolerated.  TBI teams worked with the patient and recommended HH PT/SLP, but advance HH can not provide SLP due to not having anyone that speaks [place] and they say he is doing too well to qualify for [facility] PT as he could ambulate 155ft.  Ordered him a rolling walker.  On HD #2, the patient was voiding well, tolerating diet, ambulating well, pain well controlled, vital signs stable, and felt stable for discharge home with 24hr supervision.  Patient will follow up in our office as needed and knows to call with  questions or concerns.  He will need to follow up with Dr. Kathyrn Sheriff in 2-3 weeks.      Medication List         folic acid 1 MG tablet  Commonly known as:  FOLVITE  Take 1 tablet (1 mg total) by mouth daily.     levETIRAcetam 100 MG/ML solution  Commonly known as:  KEPPRA  Take 5 mLs (500 mg total) by mouth 2 (two) times daily.     multivitamin with minerals Tabs tablet  Take 1 tablet by mouth daily.     thiamine 100 MG tablet  Take 1 tablet (100 mg total) by mouth daily.         Follow-up Information   Call [facility]. (As needed)    Contact information:   [address] [place] [address] [phone]       Follow up with No PCP Per Patient. (Establish care with a family doctor.)    Specialty:  General Practice      Follow up with [facility], NEELESH, C, MD. Schedule an appointment as soon as possible for a visit in 2 weeks. (For post-hospital follow up)    Specialty:  Neurosurgery   Contact information:   [address], [place] [address] [phone]       Signed: Nehemiah Massed. [place], [facility]  Trauma Service [phone]  11/16/2013, 3:13 PM

## 2013-11-16 NOTE — Evaluation (Signed)
Speech Language Pathology Evaluation Patient Details Name: Timothy [name] MRN: [id number] DOB: 01-22-1941 Today's Date: 11/16/2013 Time: [id number] SLP Time Calculation (min): 18 min  Problem List:  Patient Active Problem List   Diagnosis Date Noted  . SDH (subdural hematoma) 11/15/2013   Past Medical History: History reviewed. No pertinent past medical history. Past Surgical History: History reviewed. No pertinent past surgical history. HPI:  Pt is a 73 yo male with h/o alcohol abuse who fell and hit his head sustaining a small L temporal subdural hematoma on 11/15/13.     Assessment / Plan / Recommendation Clinical Impression  Pt was seen for SLP cognitive-linguistic evaluation with RN [facility] assisting with translation as needed. Pt demonstrated disorientation to time/location as well as decreased sustained attention, awareness, recall of new information, auditory comprehension, and basic problem solving, all of which impacts his overall safety/judgment. It is unclear what his baseline level of function was given that no family members were present, therefore recommend continued SLP services until impact of recent fall can be better assessed. SLP to follow acutely while in the hospital, and recommend 24/hour supervision and [facility] SLP upon d/c home with family.    SLP Assessment  Patient needs continued Speech Lanaguage Pathology Services    Follow Up Recommendations  Home health SLP;24 hour supervision/assistance    Frequency and Duration min 2x/week  1 week   Pertinent Vitals/Pain n/a   SLP Goals  SLP Goals Potential to Achieve Goals: Fair Potential Considerations: Previous level of function  SLP Evaluation Prior Functioning  Cognitive/Linguistic Baseline: Information not available  Lives With: Other (Comment) (nephew) Available Help at Discharge: Available 24 hours/day (per patient, unclear how reliable this information is)   Cognition  Overall Cognitive Status: No family/caregiver  present to determine baseline cognitive functioning Arousal/Alertness: Awake/alert Orientation Level: Oriented to person;Oriented to situation;Disoriented to time;Disoriented to place Attention: Sustained Sustained Attention: Impaired Sustained Attention Impairment: Verbal basic;Functional basic Memory: Impaired Memory Impairment: Decreased recall of new information Awareness: Impaired Awareness Impairment: Intellectual impairment;Emergent impairment;Anticipatory impairment Problem Solving: Impaired Problem Solving Impairment: Verbal basic Executive Function:  (all impaired due to lower level deficits) Safety/Judgment: Impaired    Comprehension  Auditory Comprehension Overall Auditory Comprehension: Impaired Commands: Impaired One Step Basic Commands: 50-74% accurate Two Step Basic Commands: 25-49% accurate Multistep Basic Commands: 0-24% accurate Conversation: Simple Interfering Components: Attention;Processing speed;Working Field seismologist: Repetition Retail banker: Not tested Reading Comprehension Reading Status: Not tested    Expression Expression Primary Mode of Expression: Verbal Verbal Expression Overall Verbal Expression: Impaired Initiation: No impairment Level of Generative/Spontaneous Verbalization: Conversation Repetition: No impairment Naming: No impairment Pragmatics: Impairment Impairments: Topic maintenance Interfering Components: Attention;Other (comment) (comprehension, processing speed) Non-Verbal Means of Communication: Not applicable Written Expression Written Expression: Not tested   Oral / Motor Motor Speech Overall Motor Speech: Appears within functional limits for tasks assessed   GO Functional Assessment Tool Used: skilled clinical judgment Functional Limitations: Memory Memory Current Status [id number]): At least 40 percent but less than 60 percent impaired, limited or restricted Memory Goal Status  [id number]): At least 20 percent but less than 40 percent impaired, limited or restricted    [name], M.A. CCC-SLP [phone]  [name] 11/16/2013, 1:40 PM

## 2013-11-16 NOTE — Progress Notes (Signed)
UR Completed.  Surya Folden Jane [phone] 11/16/2013  

## 2013-11-16 NOTE — Progress Notes (Signed)
Patient verbalized in his native language that he doesn't take pills at home and these medications prescribed for him here makes him sick so he wasn't able to take his medication this AM. Dr. Hulen Skains is aware. Will continue to monitor.  Ave Filter, RN

## 2013-11-16 NOTE — Progress Notes (Signed)
Trauma Service Note  Subjective: Patient sitting up in chair.  Seems comfortable.  Vomited Keppra and has refused to take it again.  No known seizure activity.  Objective: Vital signs in last 24 hours: Temp:  [97.8 F (36.6 C)-98.3 F (36.8 C)] 98.2 F (36.8 C) (07/01 0432) Pulse Rate:  [72-123] 81 (07/01 0432) Resp:  [13-26] 18 (07/01 0432) BP: (131-183)/(68-94) 161/84 mmHg (07/01 0432) SpO2:  [97 %-100 %] 100 % (07/01 0432)    Intake/Output from previous day: 06/30 0701 - 07/01 0700 In: 1600 [P.O.:500; I.V.:1100] Out: 1675 [Urine:1675] Intake/Output this shift: Total I/O In: 120 [P.O.:120] Out: 800 [Urine:800]  General: No acute distress.  Less jittery  Lungs: Clear  Abd: soft, nontender  Extremities: No deformities  Neuro: Intact  Lab Results: CBC   Recent Labs  11/15/13 0159  WBC 8.5  HGB 12.4*  HCT 36.3*  PLT 144*   BMET  Recent Labs  11/15/13 0159  NA 131*  K 4.3  CL 88*  CO2 24  GLUCOSE 258*  BUN 5*  CREATININE 0.68  CALCIUM 8.5   PT/INR  Recent Labs  11/15/13 0159  LABPROT 13.5  INR 1.03   ABG No results found for this basename: PHART, PCO2, PO2, HCO3,  in the last 72 hours  Studies/Results: Ct Head Wo Contrast  11/15/2013   CLINICAL DATA:  TBI  EXAM: CT HEAD WITHOUT CONTRAST  TECHNIQUE: Contiguous axial images were obtained from the base of the skull through the vertex without intravenous contrast.  COMPARISON:  Head CT 11/15/2013  FINDINGS: Small right convexity subdural hematoma is slightly less conspicuous when compared to previous study. The maximal diameter is unchanged 8 mm. No further regions of acute hemorrhage. There is no evidence of subfalcine or tonsillar herniation. The ventricles and cisterns are patent. Hydrocephalus ex vacuo again appreciated is stable. Stable age-appropriate global atrophy. Thin linear areas of high density within the periphery the basal ganglia on the right stable pattern again likely representing  basal ganglial calcifications. Mild areas of low attenuation within the subcortical, deep, and periventricular white white matter regions consistent with small vessel white matter ischaemia.  No acute osseus abnormalities. Paranasal sinuses and mastoid air cells stable. Scalp hematoma in the left frontal and periorbital region. Also stable.  IMPRESSION: Slight decreased prominence of the right convexity subdural hematoma. No further areas of acute hemorrhage appreciated.  Stable chronic and involutional changes.   Electronically Signed   By: Margaree Mackintosh M.D.   On: 11/15/2013 16:06   Ct Head Wo Contrast  11/15/2013   CLINICAL DATA:  Fall.  EXAM: CT HEAD WITHOUT CONTRAST  CT CERVICAL SPINE WITHOUT CONTRAST  TECHNIQUE: Multidetector CT imaging of the head and cervical spine was performed following the standard protocol without intravenous contrast. Multiplanar CT image reconstructions of the cervical spine were also generated.  COMPARISON:  None.  FINDINGS: CT HEAD FINDINGS  Skull and Sinuses:There is a large left frontal scalp hematoma. No associated fracture. Mild inflammatory mucosal thickening, best seen in the posterior left maxillary antrum.  Orbits: No acute abnormality.  Brain: There is extra-axial hematoma along the mid right cerebral hemisphere. Small area of central low density could reflect admixed CSF or serum. The hematoma is relatively small in diameter, up to 8 mm in thickness. There is no significant mass effect on the neighboring brain. There is a fine, thin high-density crescent at the level of the right external capsule. This would be an unusual morphology for hemorrhage, suspect calcification instead.  There is generalized brain atrophy with ex vacuo ventricular enlargement. No evidence of acute infarct, hydrocephalus, or mass lesion.  CT CERVICAL SPINE FINDINGS  Negative for acute fracture or subluxation. No prevertebral edema. No gross cervical canal hematoma. No significant osseous canal  or foraminal stenosis. There is a 3 mm right apical pulmonary nodule which is considered incidental in this never smoker (per the electronic medical record).  Critical Value/emergent results were called by telephone at the time of interpretation on 11/15/2013 at 2:36 AM to Dr. Julianne Rice , who verbally acknowledged these results.  IMPRESSION: 1. Small right cerebral convexity subdural hematoma, 8 mm in maximal thickness. 2.  No evidence of acute cervical spine injury. 3. Brain atrophy.   Electronically Signed   By: Jorje Guild M.D.   On: 11/15/2013 02:38   Ct Cervical Spine Wo Contrast  11/15/2013   CLINICAL DATA:  Fall.  EXAM: CT HEAD WITHOUT CONTRAST  CT CERVICAL SPINE WITHOUT CONTRAST  TECHNIQUE: Multidetector CT imaging of the head and cervical spine was performed following the standard protocol without intravenous contrast. Multiplanar CT image reconstructions of the cervical spine were also generated.  COMPARISON:  None.  FINDINGS: CT HEAD FINDINGS  Skull and Sinuses:There is a large left frontal scalp hematoma. No associated fracture. Mild inflammatory mucosal thickening, best seen in the posterior left maxillary antrum.  Orbits: No acute abnormality.  Brain: There is extra-axial hematoma along the mid right cerebral hemisphere. Small area of central low density could reflect admixed CSF or serum. The hematoma is relatively small in diameter, up to 8 mm in thickness. There is no significant mass effect on the neighboring brain. There is a fine, thin high-density crescent at the level of the right external capsule. This would be an unusual morphology for hemorrhage, suspect calcification instead.  There is generalized brain atrophy with ex vacuo ventricular enlargement. No evidence of acute infarct, hydrocephalus, or mass lesion.  CT CERVICAL SPINE FINDINGS  Negative for acute fracture or subluxation. No prevertebral edema. No gross cervical canal hematoma. No significant osseous canal or foraminal  stenosis. There is a 3 mm right apical pulmonary nodule which is considered incidental in this never smoker (per the electronic medical record).  Critical Value/emergent results were called by telephone at the time of interpretation on 11/15/2013 at 2:36 AM to Dr. Julianne Rice , who verbally acknowledged these results.  IMPRESSION: 1. Small right cerebral convexity subdural hematoma, 8 mm in maximal thickness. 2.  No evidence of acute cervical spine injury. 3. Brain atrophy.   Electronically Signed   By: Jorje Guild M.D.   On: 11/15/2013 02:38    Anti-infectives: Anti-infectives   None      Assessment/Plan: s/p  Discharge Patient should be able to go home today.  Need to clarify if the patient will need Keppra at home.  LOS: 1 day   Kathryne Eriksson. Dahlia Bailiff, MD, FACS [phone] Trauma Surgeon 11/16/2013

## 2013-11-16 NOTE — Progress Notes (Signed)
Discharge information reviewed with patient's family. RX given to pt/family. All questions answered at this time. Patient/family is waiting for transportation from the other family member.  Ave Filter, RN

## 2013-11-16 NOTE — Progress Notes (Signed)
Pt speaks no English so would likely have no benefit from [facility].  Walking 12ft min assist currently.  Pt uninsured so will make no HH arrangements since pt mobile, speaking and unable to pay the $175/visit fee. Rolling walker arranged for delivery to pt room. Medical team aware of no [place] arrangements being made.

## 2013-11-16 NOTE — Evaluation (Signed)
Physical Therapy Evaluation Patient Details Name: Timothy [name] MRN: [id number] DOB: 1941/03/29 Today's Date: 11/16/2013   History of Present Illness  Pt is a 73 yo male with h/o alcohol abuse who fell and hit his head sustaining a small L temporal subdural hematoma on 11/15/13.   Clinical Impression  Pt mildly confused with delayed processing, gait instability, and increased fall risk. Pt unsafe to return home alone. Aware pt lives with grandson but unclear if he is avail to provide 24/7 supervision. Attempted to call grandson but no answer. If 24/7 supervision can't be provided pt will need ST-SNF to achieve safe mod I function for safe transition home.    Follow Up Recommendations Home health PT;Supervision/Assistance - 24 hour (vs SNF if grandson can't provide 24/7 supervision)    Equipment Recommendations  Rolling walker with 5" wheels    Recommendations for Other Services       Precautions / Restrictions Precautions Precautions: Fall Restrictions Weight Bearing Restrictions: No      Mobility  Bed Mobility Overal bed mobility: Needs Assistance Bed Mobility: Supine to Sit     Supine to sit: Min guard     General bed mobility comments: increased time, use of bed rail  Transfers Overall transfer level: Needs assistance Equipment used: 1 person hand held assist Transfers: Sit to/from Stand Sit to Stand: Min assist         General transfer comment: assist to steady pt due to tremors and instability/balance impairment  Ambulation/Gait Ambulation/Gait assistance: Min assist Ambulation Distance (Feet): 150 Feet Assistive device: Rolling walker (2 wheeled);None Gait Pattern/deviations: Step-through pattern;Decreased stride length;Narrow base of support Gait velocity: slow   General Gait Details: pt initially required modA to maintain balance during amb without AD. Pt with tremors and lateral sway. when given walker pt with increased stability however required minA for  walker management and directional v/c's  Stairs            Wheelchair Mobility    Modified Rankin (Stroke Patients Only)       Balance Overall balance assessment: Needs assistance         Standing balance support: Bilateral upper extremity supported Standing balance-Leahy Scale: Poor Standing balance comment: pt requires physical assist or RW for safe standing. Pt stood x 5 min for hygiene s/p BM. Pt unable to perform hygiene, pt dependent.                              Pertinent Vitals/Pain Denies pain but con't rubs L side of forehead    Home Living Family/patient expects to be discharged to:: Private residence Living Arrangements:  (grandson) Available Help at Discharge:  (unsure, per RN pt reports grandson does not work)             Additional Comments: attempted to call grandson via phone # in chart however no answer. Per RN who spoke pt's native language pt reports 1 story home with 3 steps to enter with broken hand rail. pt reports drinking alot ath ome    Prior Function Level of Independence: Independent               Hand Dominance   Dominant Hand: Right    Extremity/Trunk Assessment   Upper Extremity Assessment: Generalized weakness           Lower Extremity Assessment: Generalized weakness (tremors/co-ordination deficits during amb)      Cervical / Trunk Assessment: Normal  Communication  Communication: Prefers language other than English  Cognition Arousal/Alertness: Awake/alert Behavior During Therapy: Flat affect Overall Cognitive Status: Impaired/Different from baseline Area of Impairment: Orientation;Attention;Following commands;Safety/judgement;Awareness;Problem solving Orientation Level: Disoriented to;Time;Situation;Place (knows he is not at home) Current Attention Level: Selective   Following Commands: Follows one step commands with increased time Safety/Judgement: Decreased awareness of safety;Decreased  awareness of deficits Awareness: Intellectual Problem Solving: Slow processing;Requires verbal cues;Requires tactile cues General Comments: unsure pt's level of comprehension of english and native language, per RN pt is confused. Pt did follow directional hand gestures accurately    General Comments General comments (skin integrity, edema, etc.): pt found supine in bed incontinent of stool from overnight. pt unware he had a BM.  Rn removed condom cath, at end of eval pt requested to use bathroom and requiring minA to maintain balance while standing at commode to urinate.    Exercises        Assessment/Plan    PT Assessment Patient needs continued PT services  PT Diagnosis Difficulty walking;Generalized weakness   PT Problem List Decreased strength;Decreased activity tolerance;Decreased balance;Decreased mobility;Decreased coordination  PT Treatment Interventions DME instruction;Gait training;Stair training;Functional mobility training;Therapeutic activities;Therapeutic exercise;Balance training;Neuromuscular re-education;Cognitive remediation   PT Goals (Current goals can be found in the Care Plan section) Acute Rehab PT Goals Patient Stated Goal: "I want to go home" PT Goal Formulation: With patient Time For Goal Achievement: 11/23/13 Potential to Achieve Goals: Good    Frequency Min 4X/week   Barriers to discharge  (unsure if grandson is home during the day)      Co-evaluation               End of Session Equipment Utilized During Treatment: Gait belt Activity Tolerance: Patient tolerated treatment well Patient left: in chair;with call bell/phone within reach;with chair alarm set;with nursing/sitter in room Nurse Communication: Mobility status    Functional Assessment Tool Used: clnical judgment Functional Limitation: Mobility: Walking and moving around Mobility: Walking and Moving Around Current Status [phone]): At least 20 percent but less than 40 percent impaired,  limited or restricted Mobility: Walking and Moving Around Goal Status [phone]): At least 1 percent but less than 20 percent impaired, limited or restricted    Time: 0836-0906 PT Time Calculation (min): 30 min   Charges:   PT Evaluation $Initial PT Evaluation Tier I: 1 Procedure PT Treatments $Gait Training: 8-22 mins   PT G Codes:   Functional Assessment Tool Used: clnical judgment Functional Limitation: Mobility: Walking and moving around    [place], [facility] 11/16/2013, 10:45 AM  Kittie Plater, PT, DPT Pager #: [phone] Office #: [phone]

## 2013-11-16 NOTE — Progress Notes (Signed)
Patient refused Keppra

## 2013-11-16 NOTE — Discharge Instructions (Signed)
Cai R??u (Alcohol Withdrawal) B?t c? lc no dng thu?c gy c?n tr? cc sinh ho?t trong cu?c s?ng bnh th??ng c?ng ??u l l?m d?ng. ?i?u ny bao g?m nh?ng v?n ?? lin quan ??n gia ?nh v b?n b. S? l? thu?c v? m?t tm l ngy cng b?c l? r khi tm tr b?o r?ng b?n c?n ph?i dng thu?c. Sau ? th??ng l l? thu?c v? m?t th? ch?t, khi c?n ph?i dng thu?c t?ng ln lin t?c ?? ??t ???c c?m gic t??ng t? ho?c "h?ng ph?n". ?i?u ny ???c g?i l nghi?n hay l? thu?c v? m?t ha h?c. Nguy c? m?t ng??i b? nghi?n cao h?n n?u ti?n s? trong gia ?nh c?ng c ng??i b? l? thu?c v? m?t ha h?c. H?i ch?ng cai r??u nh? x?y ra khi ng?ng u?ng r??u ? ng??i ? b? nghi?n r??u ho?c ph? thu?c vo ch?t ha h?c. Khi m?t ng??i no ? ngy cng b?c l? r vi?c dung n?p r??u, b?t c? l?n d?ng u?ng r??u ??t ng?t no ??u c th? gy ra nh?ng tri?u ch?ng kh ch?u v? m?t th?c th?. H?u h?t cc tri?u ch?ng ny l nh?, bao g?m run tay, nh?p tim nhanh, th? nhanh v s?t. ?i khi cc tri?u ch?ng ny c km theo lo u, cc c?n s? hi v c m?ng. C?ng c th? c c?m gic bu?n nn. Gi?c ng? bnh th??ng th??ng b? gin ?o?n b?ng nh?ng giai ?o?n khng th? ng? ???c (m?t ng?). ?i?u ny c th? ko di trong 6 thng. V c?m gic kh ch?u ny, nhi?u ng??i ch?n ti?p t?c u?ng r??u ?? khng b? c?m gic kh ch?u ny v ?? c? c?m th?y bnh th??ng. H?i ch?ng cai r??u n?ng khi gi?m ho?c khng u?ng r??u ? ng??i ? b? nghi?n r??u ho?c ph? thu?c vo ch?t ha h?c . Kho?ng 5% trong s? nh?ng ng??i nghi?n r??u s? xu?t hi?n nh?ng d?u hi?u cai r??u n?ng khi h? d?ng u?ng r??u. ??ng kinh (co gi?t) c?n l?n l m?t trong nh?ng d?u hi?u cai r??u n?ng. Nh?ng d?u hi?u khc c?a cai r??u l kch ??ng v l l?n n?ng. C th? km d?u hi?u tin vo nh?ng ?i?y nh?ng v?t th?c ra khng c ? ? (hoag t??ng v ?o gic). Th??ng b? thi?u vitamin n?u u?ng r??u trong th?i gian di. Bi?n php ?i?u tr? cho h?u h?t cc tr??ng h?p ny th??ng ph?i nh?p vi?n v theo di st. Ch? c d?ng t?t c? cc ch?t ha h?c  m?i c th? gip ch cho v?n ?? nghi?n. ?i?u ny kh th?c hi?n nh?ng c th? c?u ???c m?ng s?ng c?a b?n. Khi dng r??u lin t?c, h?u qu? c kh? n?ng x?y ra th??ng l m?t ?i lng t? tr?ng v s? qu tr?ng b?n thn, c hnh vi b?o l?c, v ch?t. Nghi?n khng th? ch?a kh?i ???c nh?ng c th? ng?n ch?n ???c. ?i?u ny th??ng c?n ph?i c s? gip ?? c?a nh?ng ng??i xung quanh v s? ch?m [place] c?a nh?ng ng??i c chuyn mn. Cc trung tm ?i?u tr? ???c li?t k trong danh b? ?i?n tho?i ?n danh theo Cocaine, Narcotics v Alcoholics. H?u h?t cc b?nh vi?n v phng khm ??u c th? gi?i thi?u b?n ??n m?t trung tm ch?m [place] chuyn khoa. B?n khng c?n ph?i c h?t t?t c? cc tri?u ch?ng kh ch?u c?a cai r??u. Chuyn gia ch?m [place] s?c kh?e c th? cung c?p cc lo?i thu?c ?? gip b?n v??t qua giai ?o?n kh kh?n ny.  C? g?ng trnh nh?ng tnh hu?ng, b?n b hay cc lo?i thu?c m lc tr??c c kh? n?ng khi?n cho b?n ti?p t?c u?ng r??u. Hy h?c cch ni khng. Ph?i t?n r?t nhi?u th?i gian ?? chi?n th?ng vi?c nghi?n t?t c? cc lo?i thu?c, k? c? r??u. C th? c nhi?u l?n b?n c?m th?y nh? mu?n u?ng r??u tr? l?i. Sau khi v??t qua ???c s? l? thu?c v? m?t th? ch?t v h?i ch?ng cai r??u, b?n s? gi?m b?t c?m gic thm kht nh?c nh? b?n c?n ph?i u?ng r??u ?? c c?m gic bnh th??ng. Hy g?i chuyn gia ch?m [place] s?c kh?e n?u c?n gip ?? nhi?u h?n. Hy h?c cch ni chuy?n v?i thnh vin trong gia ?nh v b?n b ?? c th? nh?n ???c s? gip ?? t? nh?ng ng??i xung quanh trong su?t giai ?o?n ny. Trung tm AA (vi?t t?t c?a Alcoholics Anonymous) ? gip ?? cho nhi?u ng??i trong nhi?u n?m. ?? ???c gip ?? nhi?u h?n, hy lin h? v?i AA hay g?i ?i?n tho?i cho chuyn gia ch?m [place] s?c kh?e, ng??i t? v?n hay ng??i ch?m [place]. Al-Anon v [place] l nh?ng t? ch?c gip ?? cho nh?ng ng??i b?n v cc thnh vin trong gia ?nh c?a ng??i nghi?n r??u. Nh?ng ng??i yu th??ng v ch?m [place] cho ng??i nghi?n r??u c?ng th??ng c?n ??n s? gip ??. ?? bi?t thng tin v? cc t? ch?c ny,  hy h?i t?ng ?i ?i?n tho?i hay g?i cho trung tm ?i?u tr? cho nh?ng ng??i nghi?n r??u ? ??a ph??ng. HY NGAY L?P T?C ?I KHM N?U:  B?n b? co gi?t.  B?n b? s?t.  B?n ? t?ng b? i nhi?u hay i ra mu. Mu i ra c th? ?? t??i hay trng gi?ng nh? b c ph ?en.  B?n ?i tiu ra mu. Phn c th? mu ?? t??i hay mu ?en nh? h?c n, c mi r?t hi.  B?n b? nh?c chng m?t ho?c ng?t. Khng ???c li xe khi b?n c c?m gic ny. Hy nh? m?t ng??i no ? li xe gip hay g?i ?i?n tho?i s? 911 nh? gip ??.  B?n b? kch ??ng hay l l?n.  B?n b? lo l?ng khng ki?m sot ???c.  B?n b?t ??u nhn th?y nh?ng th? m th?t ra khng c ? ? (?o gic). Chuyn gia ch?m  s?c kh?e ? xc ??nh r?ng b?n hon ton hi?u b?nh tr?ng c?a mnh, v tr?ng thi tinh th?n c?a b?n tr? l?i bnh th??ng. B?n hi?u r?ng b?n ? ???c ?i?u tr? ?? cai r??u, ? ??ng  khng u?ng r??u trong t nh?t 1 ngy, s? khng li xe hay v?n hnh my mc khc trong 24 gi?, v ? c c? h?i ?? h?i b?t k? cu h?i no v? b?nh tr?ng c?a mnh. Document Released: 05/05/2005 Document Revised: 01/05/2013 [facility] Patient Information 2015 [place]. This information is not intended to replace advice given to you by your health care provider. Make sure you discuss any questions you have with your health care provider.   Traumatic Brain Injury Traumatic brain injury (TBI) occurs when an injury to the head causes the brain to move back and forth. The risk of brain injury varies with the severity of the trauma. The damage can be confined to one area of the brain (focal) or involve different areas of the brain (diffuse). The severity of a brain injury can range from:  A blow or jolt to the head that disrupts the normal function  of the brain (concussion).  A deep state of unconsciousness (coma).  Death. CAUSES  A brain injury can result from:  A closed head injury. This occurs when the head suddenly and violently hits an object but the object does not  break through the skull. Examples include:  A direct blow (hitting your head on a hard surface).  An indirect blow (when your head moves rapidly and violently back and forth, like in a car crash). This injury is called contrecoup (involving a blow and counter blow). Shaken baby syndrome is a severe type of this injury. It happens when a baby is shaken forcibly enough to cause extreme contrecoup injury.  Penetrating head injury. A penetrating head injury occurs when an object pierces the skull and enters the brain tissue. Examples include:  A skull fracture occurs when the skull cracks or breaks.  A depressed skull fracture occurs when pieces of the broken skull press into the tissue of the brain. This can cause bruising of the brain tissue called a contusion. In both closed and penetrating head injuries, damage to blood vessels can cause heavy bleeding into or around the brain.  SYMPTOMS  The symptoms of a TBI depend on the type and severity of the injury. The most common symptoms include:   Confusion (disorientation) or other thinking problems.  An inability to remember events around the time of the injury (amnesia).  Difficulty staying awake or passing out (loss of consciousness).  Difficulty maintaining your balance or feeling unsteady.  Slow reaction time.  Difficulty learning or remembering things you have heard.  Headache.  Blurry vision.  Vomiting.  Seizures.  Swelling of the scalp. This occurs because of bleeding or swelling under the skin of the skull when the head is hit. TREATMENT Treatment of traumatic brain injury can involve a range of different medical options:  If a brain injury is moderate to severe, a hospital stay will be necessary to monitor:  Neurological status.  Pressure or swelling of the brain (intracranial pressure).  For seizures.  Severe brain injury cases may need surgery to:  Control bleeding.  Relieve pressure on the brain.  Remove  objects from the brain that result from a penetrating injury.  Repair the skull from an injury.  Long-term treatment of a brain injury can involve rehabilitation work such as:  Physical therapy.  Occupational therapy.  Speech therapy. PROGNOSIS The outcome of TBI depends on the cause of the injury, location, severity, and extent of neurological damage. Outcomes range from good recovery to death. Long term consequences of a TBI can include:  Difficulty concentrating or having a short attention span.  Change in personality.  Irritability.  Headaches.  Blurry vision.  Sleepiness.  Depression.  Unsteadiness that makes walking or standing hard to do. For more information and support, contact: The [employer] of Neurological Disorders and Stroke.  Document Released: 04/25/2002 Document Revised: 02/23/2013 Document Reviewed: 05/03/2009 [facility] Patient Information 2015 [place], [place]. This information is not intended to replace advice given to you by your health care provider. Make sure you discuss any questions you have with your health care provider.

## 2013-11-16 NOTE — Evaluation (Signed)
Occupational Therapy Evaluation Patient Details Name: Timothy [name] MRN: [id number] DOB: 1940/08/17 Today's Date: 11/16/2013    History of Present Illness Pt is a 73 yo male with h/o alcohol abuse who fell and hit his head sustaining a small L temporal subdural hematoma on 11/15/13.    Clinical Impression   Pt admitted with above. He demonstrates the below listed deficits and will benefit from continued OT to maximize safety and independence with BADLs.  Pt presents to OT with impaired balance and impaired safety awareness and problem solving.  No family present to determine how far off of baseline pt is.  He will need 24 hour assistance at discharge and recommend [place].       Follow Up Recommendations  Home health OT;Supervision/Assistance - 24 hour    Equipment Recommendations  None recommended by OT    Recommendations for Other Services       Precautions / Restrictions Precautions Precautions: Fall      Mobility Bed Mobility                  Transfers Overall transfer level: Needs assistance Equipment used: 1 person hand held assist Transfers: Sit to/from Stand;Stand Pivot Transfers Sit to Stand: Min assist Stand pivot transfers: Min assist            Balance Overall balance assessment: Needs assistance Sitting-balance support: Feet supported Sitting balance-Leahy Scale: Good     Standing balance support: No upper extremity supported Standing balance-Leahy Scale: Poor                              ADL Overall ADL's : Needs assistance/impaired Eating/Feeding: Supervision/ safety;Sitting   Grooming: Wash/dry hands;Wash/dry face;Oral care;Min guard;Standing Grooming Details (indicate cue type and reason): Pt with decreased thoroughness with brushing teeth.  Unsure if this is his norm or if this is a change in status.  Min guard assist for balance  Upper Body Bathing: Supervision/ safety;Sitting   Lower Body Bathing: Sit to/from stand;Min guard   Upper Body Dressing : Supervision/safety;Sitting   Lower Body Dressing: Min guard;Sit to/from stand   Toilet Transfer: Minimal assistance;Ambulation;Comfort height toilet;RW Armed forces technical officer Details (indicate cue type and reason): requires physical assist for balance and to safely guide RW Toileting- Clothing Manipulation and Hygiene: Min guard;Sit to/from stand       Functional mobility during ADLs: Minimal assistance;Rolling walker General ADL Comments: Pt with poor safety awareness and with increased risk of falls.  No family present for education     Vision                 Additional Comments: EOMs full.  Pt with difficulty following instructions to complete full visual assessment.  Pt able to read clock on wall accurately    Perception     Praxis      Pertinent Vitals/Pain Denies pain      Hand Dominance Right   Extremity/Trunk Assessment Upper Extremity Assessment Upper Extremity Assessment: Generalized weakness   Lower Extremity Assessment Lower Extremity Assessment: Defer to PT evaluation   Cervical / Trunk Assessment Cervical / Trunk Assessment: Normal   Communication Communication Communication: Prefers language other than English   Cognition Arousal/Alertness: Awake/alert Behavior During Therapy: Flat affect Overall Cognitive Status: No family/caregiver present to determine baseline cognitive functioning Area of Impairment: Attention;Safety/judgement;Awareness;Problem solving   Current Attention Level: Selective   Following Commands: Follows one step commands consistently Safety/Judgement: Decreased awareness of safety Awareness: Intellectual  Problem Solving: Difficulty sequencing;Requires verbal cues;Requires tactile cues General Comments: Pt able to state what occurred to bring him to hospital.  He demonstrates poor safety awareness.  Frequently runs into items with walker and rather than stopping and making adjustments, just continues to forge  ahead repeteadly running into the object.  pt with h/o ETOH abuse, and no family present to provide info re: pt's premorbid cognitive status   General Comments       Exercises       Shoulder Instructions      Home Living Family/patient expects to be discharged to:: Private residence Living Arrangements: Children;Other relatives Available Help at Discharge: Available 24 hours/day               Bathroom Shower/Tub: Tub/shower unit             Additional Comments: No family present to provide info re: home environment  Lives With: Other (Comment) (nephew)    Prior Functioning/Environment Level of Independence: Independent        Comments: Pt denies falls    OT Diagnosis: Generalized weakness;Cognitive deficits   OT Problem List: Decreased strength;Decreased activity tolerance;Impaired balance (sitting and/or standing);Decreased cognition;Decreased safety awareness;Decreased knowledge of use of DME or AE   OT Treatment/Interventions:      OT Goals(Current goals can be found in the care plan section)    OT Frequency:     Barriers to D/C:            Co-evaluation              End of Session Equipment Utilized During Treatment: Rolling walker Nurse Communication: Mobility status  Activity Tolerance: Patient tolerated treatment well Patient left: in chair;with call bell/phone within reach;with chair alarm set   Time: [phone] OT Time Calculation (min): 20 min Charges:  OT General Charges $OT Visit: 1 Procedure OT Evaluation $Initial OT Evaluation Tier I: 1 Procedure OT Treatments $Self Care/Home Management : 8-22 mins G-Codes: OT G-codes **NOT FOR INPATIENT CLASS** Functional Limitation: Self care Self Care Current Status [id number]): At least 1 percent but less than 20 percent impaired, limited or restricted Self Care Goal Status [id number]): At least 1 percent but less than 20 percent impaired, limited or restricted Self Care Discharge Status [phone]):  At least 1 percent but less than 20 percent impaired, limited or restricted  Timothy [name] M 11/16/2013, 4:33 PM

## 2016-01-12 ENCOUNTER — Encounter (HOSPITAL_COMMUNITY): Payer: Self-pay

## 2016-01-12 ENCOUNTER — Emergency Department (HOSPITAL_COMMUNITY)
Admission: EM | Admit: 2016-01-12 | Discharge: 2016-01-12 | Disposition: A | Discharge status: Home / Self Care | Payer: Medicare Other | Attending: Emergency Medicine | Admitting: Emergency Medicine

## 2016-01-12 DIAGNOSIS — Z79899 Other long term (current) drug therapy: Secondary | ICD-10-CM | POA: Diagnosis not present

## 2016-01-12 DIAGNOSIS — F10920 Alcohol use, unspecified with intoxication, uncomplicated: Secondary | ICD-10-CM

## 2016-01-12 DIAGNOSIS — F10120 Alcohol abuse with intoxication, uncomplicated: Secondary | ICD-10-CM | POA: Insufficient documentation

## 2016-01-12 HISTORY — DX: Alcohol abuse, uncomplicated: F10.10

## 2016-01-12 NOTE — ED Triage Notes (Signed)
PT RECEIVED VIA EMS FOR ALCOHOL INTOXICATION. PER EMS, PT WAS FOUND LYING IN THE [place]ASS NEAR THE STREET. PT WAS UNABLE TO AMBULATE ON SCENE. PT STS HE DRINKS A LOT, BUT HE IS NOT DRUNK, AND HE WANTS TO GO HOME. PT WAS GIVEN NS 500ML BOLUS PTA.

## 2016-01-12 NOTE — ED Notes (Signed)
Pt ambulated to the restroom with assistance. Pt was unsteady on his feet.

## 2016-01-12 NOTE — ED Notes (Signed)
PT AMBULATED TO THE BATHROOM. GAIT STEADY. PT STS, I WANT TO GO HOME. I DRINK A LOT TODAY, BUT I'M NOT DRUNK. CAN YOU TAKE ME HOME? I'LL PAY YOU $20."

## 2016-01-12 NOTE — ED Provider Notes (Signed)
[facility] DEPT Provider Note   CSN: [id number] Arrival date & time: 01/12/16  1757     History   Chief Complaint Chief Complaint  Patient presents with  . Alcohol Intoxication   Level V caveat due to intoxication. HPI Timothy [name] is a 75 y.o. male.  The history is provided by the patient and the police.   Patient brought in for reported intoxication. Reportedly said he couldn't walk initially. Patient now states he was just drunk. History of same. Denies fall. No evidence of trauma on his head. Patient offers $31for me to drive him home. He states he does not have a phone # to call for someone to come and get him.  Past Medical History:  Diagnosis Date  . Alcohol abuse     Patient Active Problem List   Diagnosis Date Noted  . Fall at home 11/16/2013  . Alcohol abuse with intoxication ([place]) 11/16/2013  . Closed TBI (traumatic brain injury) ([place]) 11/16/2013  . SDH (subdural hematoma) ([place]) 11/15/2013    History reviewed. No pertinent surgical history.     Home Medications    Prior to Admission medications   Medication Sig Start Date End Date Taking? Authorizing Provider  folic acid (FOLVITE) 1 MG tablet Take 1 tablet (1 mg total) by mouth daily. 11/16/13   [name], PA-C  levETIRAcetam (KEPPRA) 100 MG/ML solution Take 5 mLs (500 mg total) by mouth 2 (two) times daily. 11/16/13 11/23/13  [name], PA-C  Multiple Vitamin (MULTIVITAMIN WITH MINERALS) TABS tablet Take 1 tablet by mouth daily. 11/16/13   [name], PA-C  thiamine 100 MG tablet Take 1 tablet (100 mg total) by mouth daily. 11/16/13   [name], PA-C    Family History History reviewed. No pertinent family history.  Social History Social History  Substance Use Topics  . Smoking status: Never Smoker  . Smokeless tobacco: Never Used  . Alcohol use Yes     Comment: UTA he drinks "alot" according to grandson     Allergies   Review of patient's allergies indicates no known  allergies.   Review of Systems Review of Systems  Unable to perform ROS: Other     Physical Exam Updated Vital Signs BP 170/85 (BP Location: Right Arm)   Pulse 92   Temp 97.8 F (36.6 C)   Resp 16   Ht 5\' 5"  (1.651 m)   Wt 130 lb (59 kg)   SpO2 92%   BMI 21.63 kg/m   Physical Exam  Constitutional: He appears well-developed.  HENT:  Head: Atraumatic.  Eyes: Pupils are equal, round, and reactive to light.  Cardiovascular: Normal rate.   Pulmonary/Chest: Effort normal.  Abdominal: Soft. There is no tenderness.  Musculoskeletal: He exhibits no tenderness.  Neurological: He is alert.  Patient smells of alcohol  Skin: Skin is warm. Capillary refill takes less than 2 seconds.     ED Treatments / Results  Labs (all labs ordered are listed, but only abnormal results are displayed) Labs Reviewed - No data to display  EKG  EKG Interpretation None       Radiology No results found.  Procedures Procedures (including critical care time)  Medications Ordered in ED Medications - No data to display   Initial Impression / Assessment and Plan / ED Course  I have reviewed the triage vital signs and the nursing notes.  Pertinent labs & imaging results that were available during my care of the patient were reviewed by  me and considered in my medical decision making (see chart for details).  Clinical Course    Patient with alcohol intoxication. Able to ambulate without difficulty. No evidence of trauma. Will d/c. Reportedly has money for a cab  Final Clinical Impressions(s) / ED Diagnoses   Final diagnoses:  Alcohol intoxication, uncomplicated ([place])    New Prescriptions New Prescriptions   No medications on file     Davonna Belling, MD 01/12/16 2038

## 2016-01-12 NOTE — ED Notes (Signed)
Bed: [facility] Expected date:  Expected time:  Means of arrival:  Comments: EMS/ETOH 

## 2016-01-12 NOTE — ED Notes (Signed)
PT DISCHARGED. INSTRUCTIONS GIVEN. AAOX4. PT IN NO APPARENT DISTRESS OR PAIN. THE OPPORTUNITY TO ASK QUESTIONS WAS PROVIDED. 

## 2017-02-11 ENCOUNTER — Telehealth: Payer: Self-pay | Admitting: *Deleted

## 2017-02-11 ENCOUNTER — Encounter: Payer: Self-pay | Admitting: Emergency Medicine

## 2017-02-11 ENCOUNTER — Ambulatory Visit (INDEPENDENT_AMBULATORY_CARE_PROVIDER_SITE_OTHER): Payer: Medicare Other | Admitting: Emergency Medicine

## 2017-02-11 VITALS — BP 138/66 | HR 87 | Temp 98.3°F | Resp 16 | Ht 61.0 in | Wt 88.6 lb

## 2017-02-11 DIAGNOSIS — Z0000 Encounter for general adult medical examination without abnormal findings: Secondary | ICD-10-CM | POA: Insufficient documentation

## 2017-02-11 DIAGNOSIS — R1013 Epigastric pain: Secondary | ICD-10-CM | POA: Diagnosis not present

## 2017-02-11 NOTE — Progress Notes (Signed)
Timothy [name] 76 y.o.   Chief Complaint  Patient presents with  . Establish Care    has not be to doctor for years  . Abdominal Pain    x 1 month    HISTORY OF PRESENT ILLNESS: This is a 76 y.o. male brought in by friends for medical evaluation; has been homeless for 2 weeks after home he was staying at got condemned; friends helping getting him relocated to perhaps an ALF. Medical records reviewed; pt looks in no distress; difficult historian but ROS disclosed chronic epigastric abdominal pain. History obtained thru interpreter.  HPI   Prior to Admission medications   Medication Sig Start Date End Date Taking? Authorizing Provider  folic acid (FOLVITE) 1 MG tablet Take 1 tablet (1 mg total) by mouth daily. Patient not taking: Reported on 02/11/2017 11/16/13   [name], PA-C  levETIRAcetam (KEPPRA) 100 MG/ML solution Take 5 mLs (500 mg total) by mouth 2 (two) times daily. 11/16/13 11/23/13  [name], PA-C  Multiple Vitamin (MULTIVITAMIN WITH MINERALS) TABS tablet Take 1 tablet by mouth daily. Patient not taking: Reported on 02/11/2017 11/16/13   [name], PA-C  thiamine 100 MG tablet Take 1 tablet (100 mg total) by mouth daily. Patient not taking: Reported on 02/11/2017 11/16/13   [name], PA-C    No Known Allergies  Patient Active Problem List   Diagnosis Date Noted  . Fall at home 11/16/2013  . Alcohol abuse with intoxication ([place]) 11/16/2013  . Closed TBI (traumatic brain injury) ([place]) 11/16/2013  . SDH (subdural hematoma) ([place]) 11/15/2013    Past Medical History:  Diagnosis Date  . Alcohol abuse     No past surgical history on file.  Social History   Social History  . Marital status: Single    Spouse name: N/A  . Number of children: N/A  . Years of education: N/A   Occupational History  . Not on file.   Social History Main Topics  . Smoking status: Never Smoker  . Smokeless tobacco: Never Used  . Alcohol use Yes     Comment: UTA he drinks  "alot" according to grandson  . Drug use: No  . Sexual activity: Not on file   Other Topics Concern  . Not on file   Social History Narrative  . No narrative on file    No family history on file.   Review of Systems  Constitutional: Positive for weight loss. Negative for chills and fever.  HENT: Negative for hearing loss.   Eyes:       Poor vision  Respiratory: Positive for cough. Negative for hemoptysis and shortness of breath.   Cardiovascular: Negative for chest pain, palpitations and leg swelling.  Gastrointestinal: Positive for abdominal pain. Negative for blood in stool, constipation, diarrhea, nausea and vomiting.  Genitourinary: Negative for dysuria and hematuria.  Musculoskeletal: Negative for back pain and neck pain.  Skin: Negative for rash.  Neurological: Negative for dizziness and headaches.  Endo/Heme/Allergies: Negative.   All other systems reviewed and are negative.  Vitals:   02/11/17 1145  BP: 138/66  Pulse: 87  Resp: 16  Temp: 98.3 F (36.8 C)  SpO2: 99%     Physical Exam  Constitutional: He is oriented to person, place, and time. He appears well-developed and well-nourished.  HENT:  Head: Normocephalic and atraumatic.  Right Ear: External ear normal.  Left Ear: External ear normal.  Mouth/Throat: Oropharynx is clear and moist.  Very poor dentition  Eyes: Pupils are equal, round, and reactive to light. Conjunctivae and EOM are normal.  Neck: Normal range of motion. Neck supple. No JVD present.  Cardiovascular: Normal rate, regular rhythm and normal heart sounds.   Pulmonary/Chest: Effort normal and breath sounds normal.  Abdominal: Soft. Bowel sounds are normal. He exhibits no distension and no mass. There is no tenderness.  Musculoskeletal: Normal range of motion.  Lymphadenopathy:    He has no cervical adenopathy.  Neurological: He is alert and oriented to person, place, and time. He displays normal reflexes. No sensory deficit. He exhibits  normal muscle tone.  Skin: Skin is warm and dry. Capillary refill takes less than 2 seconds. No rash noted.  Vitals reviewed.    ASSESSMENT & PLAN: Timothy [name] was seen today for establish care and abdominal pain.  Diagnoses and all orders for this visit:  Epigastric pain -     CBC with Differential/Platelet -     Comprehensive metabolic panel -     TB Skin Test  General medical examination   Follow up in 2 days accompanied by care taker who will provide more detailed information needed for further evaluation.   Agustina Caroli, MD [facility]

## 2017-02-11 NOTE — Telephone Encounter (Signed)
FL2 form is provider's box.

## 2017-02-11 NOTE — Patient Instructions (Signed)
     IF you received an x-ray today, you will receive an invoice from [facility]. Please contact [facility] at [phone] with questions or concerns regarding your invoice.   IF you received labwork today, you will receive an invoice from [facility]. Please contact [facility] at [phone] with questions or concerns regarding your invoice.   Our billing staff will not be able to assist you with questions regarding bills from these companies.  You will be contacted with the lab results as soon as they are available. The fastest way to get your results is to activate your My Chart account. Instructions are located on the last page of this paperwork. If you have not heard from us regarding the results in 2 weeks, please contact this office.     

## 2017-02-11 NOTE — Progress Notes (Signed)
  Tuberculosis Risk Questionnaire  1. Yes Were you born outside the [place] in one of the following parts of the world: [place], [place], [place], [place] or [place]?    2. No Have you traveled outside the [place] and lived for more than one month in one of the following parts of the world: [place], [place], [place], [place] or [place]?    3. No Do you have a compromised immune system such as from any of the following conditions:HIV/AIDS, organ or bone marrow transplantation, diabetes, immunosuppressive medicines (e.g. Prednisone, Remicaide), leukemia, lymphoma, cancer of the head or neck, gastrectomy or jejunal bypass, end-stage renal disease (on dialysis), or silicosis?     4. No Have you ever or do you plan on working in: a residential care center, a health care facility, a jail or prison or homeless shelter?    5. No Have you ever: injected illegal drugs, used crack cocaine, lived in a homeless shelter  or been in jail or prison?     6. No Have you ever been exposed to anyone with infectious tuberculosis?  7. Yes Have you ever had a BCG vaccine? (BCG is a vaccine for tuberculosis  (TB) used in OTHER countries, NOT in the [name]).  8. No Have you ever been advised by a health care provider NOT to have a TB skin test?  9. No Have you ever had a POSITIVE TB skin test?  IF SO, when? n/a  IF SO, were you treated with INH? n/a  IF SO, where? n/a  Tuberculosis Symptom Questionnaire  Do you currently have any of the following symptoms?  1. No Unexplained cough lasting more than 3 weeks?   2. No Unexplained fever lasting more than 3 weeks.   3. No Night Sweats (sweating that leaves the bedclothes and sheets wet)     4. No Shortness of Breath   5. No Chest Pain   6. No Unintentional weight loss    7. No Unexplained fatigue (very tired for no reason)

## 2017-02-12 LAB — COMPREHENSIVE METABOLIC PANEL
ALBUMIN: 2.8 g/dL — AB (ref 3.5–4.8)
ALT: 5 IU/L (ref 0–44)
AST: 17 IU/L (ref 0–40)
Albumin/Globulin Ratio: 0.8 — ABNORMAL LOW (ref 1.2–2.2)
Alkaline Phosphatase: 101 IU/L (ref 39–117)
BUN / CREAT RATIO: 11 (ref 10–24)
BUN: 16 mg/dL (ref 8–27)
CHLORIDE: 96 mmol/L (ref 96–106)
CO2: 25 mmol/L (ref 20–29)
CREATININE: 1.41 mg/dL — AB (ref 0.76–1.27)
Calcium: 8.3 mg/dL — ABNORMAL LOW (ref 8.6–10.2)
GFR calc Af Amer: 55 mL/min/{1.73_m2} — ABNORMAL LOW (ref >59)
GFR calc non Af Amer: 47 mL/min/{1.73_m2} — ABNORMAL LOW (ref >59)
Globulin, Total: 3.4 g/dL (ref 1.5–4.5)
Glucose: 177 mg/dL — ABNORMAL HIGH (ref 65–99)
Potassium: 4 mmol/L (ref 3.5–5.2)
SODIUM: 134 mmol/L (ref 134–144)
Total Protein: 6.2 g/dL (ref 6.0–8.5)

## 2017-02-12 LAB — CBC WITH DIFFERENTIAL/PLATELET
Basophils Absolute: 0 10*3/uL (ref 0.0–0.2)
Basos: 0 %
EOS (ABSOLUTE): 0.2 10*3/uL (ref 0.0–0.4)
Eos: 2 %
HEMOGLOBIN: 10.4 g/dL — AB (ref 13.0–17.7)
Hematocrit: 32.9 % — ABNORMAL LOW (ref 37.5–51.0)
Immature Grans (Abs): 0 10*3/uL (ref 0.0–0.1)
Immature Granulocytes: 0 %
Lymphocytes Absolute: 1.5 10*3/uL (ref 0.7–3.1)
Lymphs: 15 %
MCH: 24.2 pg — AB (ref 26.6–33.0)
MCHC: 31.6 g/dL (ref 31.5–35.7)
MCV: 77 fL — ABNORMAL LOW (ref 79–97)
MONOS ABS: 0.8 10*3/uL (ref 0.1–0.9)
Monocytes: 8 %
NEUTROS ABS: 7.2 10*3/uL — AB (ref 1.4–7.0)
Neutrophils: 75 %
Platelets: 410 10*3/uL — ABNORMAL HIGH (ref 150–379)
RBC: 4.3 x10E6/uL (ref 4.14–5.80)
RDW: 17 % — AB (ref 12.3–15.4)
WBC: 9.7 10*3/uL (ref 3.4–10.8)

## 2017-02-13 ENCOUNTER — Ambulatory Visit (INDEPENDENT_AMBULATORY_CARE_PROVIDER_SITE_OTHER): Payer: Medicare Other | Admitting: Emergency Medicine

## 2017-02-13 ENCOUNTER — Encounter: Payer: Self-pay | Admitting: Emergency Medicine

## 2017-02-13 VITALS — BP 120/72 | HR 92 | Temp 98.3°F | Resp 16 | Ht 61.0 in | Wt 91.2 lb

## 2017-02-13 DIAGNOSIS — G319 Degenerative disease of nervous system, unspecified: Secondary | ICD-10-CM | POA: Diagnosis not present

## 2017-02-13 DIAGNOSIS — N189 Chronic kidney disease, unspecified: Secondary | ICD-10-CM

## 2017-02-13 DIAGNOSIS — Z23 Encounter for immunization: Secondary | ICD-10-CM | POA: Diagnosis not present

## 2017-02-13 DIAGNOSIS — D638 Anemia in other chronic diseases classified elsewhere: Secondary | ICD-10-CM | POA: Insufficient documentation

## 2017-02-13 DIAGNOSIS — D631 Anemia in chronic kidney disease: Secondary | ICD-10-CM | POA: Diagnosis not present

## 2017-02-13 LAB — TB SKIN TEST
Induration: 0 mm
TB SKIN TEST: NEGATIVE

## 2017-02-13 NOTE — Progress Notes (Signed)
Timothy [name] 76 y.o.   Chief Complaint  Patient presents with  . Follow-up    to complete FL2 paperwork    HISTORY OF PRESENT ILLNESS: This is a 76 y.o. male here for follow up visit from 9/26; labs reviewed; caretaker here with him; has known patient for many years and is providing useful information to help place patient into an ALF where he can get help he needs with daily activities. Form reviewed and filled out. Recent brain CT shows significant brain atrophy. PPD negative. Significant amount of time spent with caretaker reviewing medical Hx. and patient's needs.  HPI   Prior to Admission medications   Medication Sig Start Date End Date Taking? Authorizing Provider  folic acid (FOLVITE) 1 MG tablet Take 1 tablet (1 mg total) by mouth daily. Patient not taking: Reported on 02/13/2017 11/16/13   [name], PA-C  levETIRAcetam (KEPPRA) 100 MG/ML solution Take 5 mLs (500 mg total) by mouth 2 (two) times daily. 11/16/13 11/23/13  [name], PA-C  Multiple Vitamin (MULTIVITAMIN WITH MINERALS) TABS tablet Take 1 tablet by mouth daily. Patient not taking: Reported on 02/11/2017 11/16/13   [name], PA-C  thiamine 100 MG tablet Take 1 tablet (100 mg total) by mouth daily. Patient not taking: Reported on 02/11/2017 11/16/13   [name], PA-C    No Known Allergies  Patient Active Problem List   Diagnosis Date Noted  . Epigastric pain 02/11/2017  . General medical examination 02/11/2017  . Fall at home 11/16/2013  . Alcohol abuse with intoxication ([place]) 11/16/2013  . Closed TBI (traumatic brain injury) ([place]) 11/16/2013  . SDH (subdural hematoma) ([place]) 11/15/2013    Past Medical History:  Diagnosis Date  . Alcohol abuse     No past surgical history on file.  Social History   Social History  . Marital status: Single    Spouse name: N/A  . Number of children: N/A  . Years of education: N/A   Occupational History  . Not on file.   Social History Main Topics  .  Smoking status: Never Smoker  . Smokeless tobacco: Never Used  . Alcohol use Yes     Comment: UTA he drinks "alot" according to grandson  . Drug use: No  . Sexual activity: Not on file   Other Topics Concern  . Not on file   Social History Narrative  . No narrative on file    No family history on file.   ROS Unchanged from 2 days ago.  Vitals:   02/13/17 1017  BP: 120/72  Pulse: 92  Resp: 16  Temp: 98.3 F (36.8 C)  SpO2: 98%    Physical Exam  Constitutional: He is oriented to person, place, and time. He appears well-developed and well-nourished.  HENT:  Head: Normocephalic and atraumatic.  Eyes: Pupils are equal, round, and reactive to light.  Neck: Normal range of motion.  Cardiovascular: Normal rate and regular rhythm.   Pulmonary/Chest: Effort normal.  Abdominal: Soft. There is no tenderness.  Musculoskeletal: Normal range of motion.  Neurological: He is alert and oriented to person, place, and time.  Skin: Skin is warm and dry. Capillary refill takes less than 2 seconds.  Psychiatric: He has a normal mood and affect. His behavior is normal.  Vitals reviewed.    Results for orders placed or performed in visit on 02/11/17 (from the past 24 hour(s))  TB Skin Test     Status: None   Collection Time:  02/13/17 11:04 AM  Result Value Ref Range   TB Skin Test Negative    Induration 0 mm    Results for orders placed or performed in visit on 02/11/17 (from the past 72 hour(s))  CBC with Differential/Platelet     Status: Abnormal   Collection Time: 02/11/17  3:08 PM  Result Value Ref Range   WBC 9.7 3.4 - 10.8 x10E3/uL   RBC 4.30 4.14 - 5.80 x10E6/uL   Hemoglobin 10.4 (L) 13.0 - 17.7 g/dL   Hematocrit 32.9 (L) 37.5 - 51.0 %   MCV 77 (L) 79 - 97 fL   MCH 24.2 (L) 26.6 - 33.0 pg   MCHC 31.6 31.5 - 35.7 g/dL   RDW 17.0 (H) 12.3 - 15.4 %   Platelets 410 (H) 150 - 379 x10E3/uL   Neutrophils 75 Not Estab. %   Lymphs 15 Not Estab. %   Monocytes 8 Not Estab. %    Eos 2 Not Estab. %   Basos 0 Not Estab. %   Neutrophils Absolute 7.2 (H) 1.4 - 7.0 x10E3/uL   Lymphocytes Absolute 1.5 0.7 - 3.1 x10E3/uL   Monocytes Absolute 0.8 0.1 - 0.9 x10E3/uL   EOS (ABSOLUTE) 0.2 0.0 - 0.4 x10E3/uL   Basophils Absolute 0.0 0.0 - 0.2 x10E3/uL   Immature Granulocytes 0 Not Estab. %   Immature Grans (Abs) 0.0 0.0 - 0.1 x10E3/uL  Comprehensive metabolic panel     Status: Abnormal   Collection Time: 02/11/17  3:08 PM  Result Value Ref Range   Glucose 177 (H) 65 - 99 mg/dL   BUN 16 8 - 27 mg/dL   Creatinine, Ser 1.41 (H) 0.76 - 1.27 mg/dL   GFR calc non Af Amer 47 (L) >59 mL/min/1.73   GFR calc Af Amer 55 (L) >59 mL/min/1.73   BUN/Creatinine Ratio 11 10 - 24   Sodium 134 134 - 144 mmol/L   Potassium 4.0 3.5 - 5.2 mmol/L   Chloride 96 96 - 106 mmol/L   CO2 25 20 - 29 mmol/L   Calcium 8.3 (L) 8.6 - 10.2 mg/dL   Total Protein 6.2 6.0 - 8.5 g/dL   Albumin 2.8 (L) 3.5 - 4.8 g/dL   Globulin, Total 3.4 1.5 - 4.5 g/dL   Albumin/Globulin Ratio 0.8 (L) 1.2 - 2.2   Bilirubin Total <0.2 0.0 - 1.2 mg/dL   Alkaline Phosphatase 101 39 - 117 IU/L   AST 17 0 - 40 IU/L   ALT 5 0 - 44 IU/L  TB Skin Test     Status: None   Collection Time: 02/13/17 11:04 AM  Result Value Ref Range   TB Skin Test Negative    Induration 0 mm    ASSESSMENT & PLAN: Timothy [name] was seen today for follow-up.  Diagnoses and all orders for this visit:  Chronic kidney disease, unspecified CKD stage -     Ambulatory referral to Nephrology  Need for prophylactic vaccination and inoculation against influenza -     Flu Vaccine QUAD 36+ mos IM  Anemia in chronic kidney disease, unspecified CKD stage  Diffuse brain atrophy Comments: with suspected dementia     Patient Instructions       IF you received an x-ray today, you will receive an invoice from [facility]. Please contact [facility] at [phone] with questions or concerns regarding your invoice.   IF you received  labwork today, you will receive an invoice from [facility]. Please contact [facility] at [phone] with questions or concerns regarding  your invoice.   Our billing staff will not be able to assist you with questions regarding bills from these companies.  You will be contacted with the lab results as soon as they are available. The fastest way to get your results is to activate your My Chart account. Instructions are located on the last page of this paperwork. If you have not heard from [name] regarding the results in 2 weeks, please contact this office.     Chronic Kidney Disease, Adult Chronic kidney disease (CKD) happens when the kidneys are damaged during a time of 3 or more months. The kidneys are two organs that do many important jobs in the body. These jobs include:  Removing wastes and extra fluids from the blood.  Making hormones that maintain the amount of fluid in your tissues and blood vessels.  Making sure that the body has the right amount of fluids and chemicals.  Most of the time, this condition does not go away, but it can usually be controlled. Steps must be taken to slow down the kidney damage or stop it from getting worse. Otherwise, the kidneys may stop working. Follow these instructions at home:  Follow your diet as told by your doctor. You may need to avoid alcohol, salty foods (sodium), and foods that are high in potassium, calcium, and protein.  Take over-the-counter and prescription medicines only as told by your doctor. Do not take any new medicines unless your doctor says you can do that. These include vitamins and minerals. ? Medicines and nutritional supplements can make kidney damage worse. ? Your doctor may need to change how much medicine you take.  Do not use any tobacco products. These include cigarettes, chewing tobacco, and e-cigarettes. If you need help quitting, ask your doctor.  Keep all follow-up visits as told by your doctor. This is important.  Check  your blood pressure. Tell your doctor if there are changes to your blood pressure.  Get to a healthy weight. Stay at that weight. If you need help with this, ask your doctor.  Start or continue an exercise plan. Try to exercise at least 30 minutes a day, 5 days a week.  Stay up-to-date with your shots (immunizations) as told by your doctor. Contact a doctor if:  Your symptoms get worse.  You have new symptoms. Get help right away if:  You have symptoms of end-stage kidney disease. These include: ? Headaches. ? Skin that is darker or lighter than normal. ? Numbness in your hands or feet. ? Easy bruising. ? Having hiccups often. ? Chest pain. ? Shortness of breath. ? Stopping of menstrual periods in women.  You have a fever.  You are making very little pee (urine).  You have pain or bleeding when you pee (urinate). This information is not intended to replace advice given to you by your health care provider. Make sure you discuss any questions you have with your health care provider. Document Released: 07/30/2009 Document Revised: 10/11/2015 Document Reviewed: 01/02/2012 Elsevier Interactive Patient Education  2017 Elsevier Inc.     Agustina Caroli, MD [facility]

## 2017-02-13 NOTE — Patient Instructions (Addendum)
     IF you received an x-ray today, you will receive an invoice from [facility]. Please contact [facility] at [phone] with questions or concerns regarding your invoice.   IF you received labwork today, you will receive an invoice from [place]. Please contact [facility] at [phone] with questions or concerns regarding your invoice.   Our billing staff will not be able to assist you with questions regarding bills from these companies.  You will be contacted with the lab results as soon as they are available. The fastest way to get your results is to activate your My Chart account. Instructions are located on the last page of this paperwork. If you have not heard from [name] regarding the results in 2 weeks, please contact this office.     Chronic Kidney Disease, Adult Chronic kidney disease (CKD) happens when the kidneys are damaged during a time of 3 or more months. The kidneys are two organs that do many important jobs in the body. These jobs include:  Removing wastes and extra fluids from the blood.  Making hormones that maintain the amount of fluid in your tissues and blood vessels.  Making sure that the body has the right amount of fluids and chemicals.  Most of the time, this condition does not go away, but it can usually be controlled. Steps must be taken to slow down the kidney damage or stop it from getting worse. Otherwise, the kidneys may stop working. Follow these instructions at home:  Follow your diet as told by your doctor. You may need to avoid alcohol, salty foods (sodium), and foods that are high in potassium, calcium, and protein.  Take over-the-counter and prescription medicines only as told by your doctor. Do not take any new medicines unless your doctor says you can do that. These include vitamins and minerals. ? Medicines and nutritional supplements can make kidney damage worse. ? Your doctor may need to change how much medicine you  take.  Do not use any tobacco products. These include cigarettes, chewing tobacco, and e-cigarettes. If you need help quitting, ask your doctor.  Keep all follow-up visits as told by your doctor. This is important.  Check your blood pressure. Tell your doctor if there are changes to your blood pressure.  Get to a healthy weight. Stay at that weight. If you need help with this, ask your doctor.  Start or continue an exercise plan. Try to exercise at least 30 minutes a day, 5 days a week.  Stay up-to-date with your shots (immunizations) as told by your doctor. Contact a doctor if:  Your symptoms get worse.  You have new symptoms. Get help right away if:  You have symptoms of end-stage kidney disease. These include: ? Headaches. ? Skin that is darker or lighter than normal. ? Numbness in your hands or feet. ? Easy bruising. ? Having hiccups often. ? Chest pain. ? Shortness of breath. ? Stopping of menstrual periods in women.  You have a fever.  You are making very little pee (urine).  You have pain or bleeding when you pee (urinate). This information is not intended to replace advice given to you by your health care provider. Make sure you discuss any questions you have with your health care provider. Document Released: 07/30/2009 Document Revised: 10/11/2015 Document Reviewed: 01/02/2012 Elsevier Interactive Patient Education  2017 [employer].

## 2017-03-16 ENCOUNTER — Ambulatory Visit (INDEPENDENT_AMBULATORY_CARE_PROVIDER_SITE_OTHER): Payer: Medicare Other | Admitting: Internal Medicine

## 2017-03-16 ENCOUNTER — Encounter: Payer: Self-pay | Admitting: Internal Medicine

## 2017-03-16 VITALS — BP 190/100 | HR 92 | Resp 12 | Ht 60.0 in | Wt 96.0 lb

## 2017-03-16 DIAGNOSIS — N189 Chronic kidney disease, unspecified: Secondary | ICD-10-CM

## 2017-03-16 DIAGNOSIS — H547 Unspecified visual loss: Secondary | ICD-10-CM

## 2017-03-16 DIAGNOSIS — E1129 Type 2 diabetes mellitus with other diabetic kidney complication: Secondary | ICD-10-CM | POA: Insufficient documentation

## 2017-03-16 DIAGNOSIS — E1122 Type 2 diabetes mellitus with diabetic chronic kidney disease: Secondary | ICD-10-CM

## 2017-03-16 DIAGNOSIS — D649 Anemia, unspecified: Secondary | ICD-10-CM

## 2017-03-16 DIAGNOSIS — F1010 Alcohol abuse, uncomplicated: Secondary | ICD-10-CM | POA: Insufficient documentation

## 2017-03-16 MED ORDER — METOPROLOL TARTRATE 25 MG PO TABS: 25.0000 mg | ORAL_TABLET | Freq: Two times a day (BID) | ORAL | 11 refills | Status: DC

## 2017-03-16 MED ORDER — THIAMINE HCL 100 MG PO TABS: 100.0000 mg | ORAL_TABLET | Freq: Every day | ORAL | Status: DC

## 2017-03-16 MED ORDER — FOLIC ACID 1 MG PO TABS: 1.0000 mg | ORAL_TABLET | Freq: Every day | ORAL | Status: DC

## 2017-03-16 MED ORDER — FERROUS GLUCONATE 324 (38 FE) MG PO TABS: 324.0000 mg | ORAL_TABLET | Freq: Every day | ORAL | 3 refills | Status: DC

## 2017-03-16 NOTE — Progress Notes (Signed)
Subjective:    Patient ID: Timothy [name], male    DOB: [birth year], 76 y.o.   MRN: [id number]  HPI   Here to establish Was living with woman at the [facility] apartment complex that was condemned.   Could not find a place to stay after it was shuttered.  September 21st--living outside for 3 days, and in a hotel since. Went to [place] urgent care 02/11/2017. Prior had not seen a doctor in 2 years. Concern he does not have the ability to live by himself safely.  Admits to difficulties with memory. Can bathe and dress himself. Cannot cook for himself.   He does not think he could remember to take medication if they were prescribed unless he had someone to remind him.  1.  DM:  Patient cannot recall ever being treated or being told he has this problem.  His previous roommate, however, shared he has a history of diabetes. No recent eye check--states cannot see well.   Has had his flu vaccine already  2.  Elevated BP:  BP was fine in recent evaluations in chart..   3.  History of a subdural hematoma with drinking in 2015.    4.  Significant Alcohol Abuse.  5.  Microcytic anemia:   History of a chronic anemia dating to at least 2015.  Hgb:  10.4 last month, down from 12.4 in 2015.  No melena, No hematochezia.  6.  Likely CKD--recent BUN/Crea:  16/1.41 as compared to 5/0.68 in 2015.      No outpatient prescriptions have been marked as taking for the 03/16/17 encounter (Office Visit) with Mack Hook, MD.   No Known Allergies   Past Medical History:  Diagnosis Date  . Alcohol abuse    Patient denies, but was shared with people from [facility] by his male roommate.  . DM (diabetes mellitus), type 2 with renal complications ([place])    uncertain when diagnosed  . Poor vision    No past surgical history on file.   No family history on file.   Social History   Social History  . Marital status: Married    Spouse name: N/A  . Number of children: 4  . Years of education: 0     Occupational History  . unemployed    Social History Main Topics  . Smoking status: Former Smoker    Types: Cigarettes    Quit date: 05/19/1990  . Smokeless tobacco: Never Used  . Alcohol use Yes     [name]: UTA he drinks "alot" according to grandson  . Drug use: No  . Sexual activity: Not on file   Other Topics Concern  . Not on file   Social History Narrative   Originally from [place]   [place]   Was in camp in [place] for 3-4 months.   [place].  Initially in [place].   He came here with his nephew and lived together until the house where he was living was sold, then went separate ways.  Not clear how long ago this was.   Hmuin Rcom, with whom he was recently lived with at [place], stated he moved from place to place until kicked out until he lived with her for past year. (2018)   Used to work in Consulting civil engineer.      Looking at possibility of [employer] or [employer].  [place] male already living there.      Never attended school.  Does not  read or write in his language of [place].      Left [place] as fleeing communists.  Denies any physical injury/toruture or imprisonment.   He did fight in the war in [place].     Left his wife behind in their home.                        Review of Systems     Objective:   Physical Exam NAD HEENT:  PERRL, EOMI, narrowing of arteries on disc exam.  TMs pearly gray.  Throat without injection.  Very poor dental hygiene.  Missing many teeth and the ones remaining with significant decay. Neck:  Supple, No adenopathy, No thyromegaly Chest:  CTA CV:  RRR with normal S1 and S2, No S3, S4 or murmur.  Radial and DP pulses normal and equal Abd:  S, NT, No HSM or mass, + BS LE:  No edema.  Unable to recall 3 words after 5 minutes (in his own language of [place] and after repeating them back to the interpreter)     Assessment & Plan:  1.  Dementia:  Not clear now much of his cognitive difficulties  arise from his alcoholism, but feel he will not likely be able to adequately care for himself living with some minimal support for meals and taking medication.  Recommending him for assisted living care.  2.  Alcoholism:  Start Thiamine 003 mg daily, Folic Acid 1 mg daily   3.  Microcytic anemia:  Ferrous gluconate 324 mg once daily.  B12 7048 mg daily and folic Acid 1 mg daily.  Check RBC folate, unable to get B12 to be accepted for Medicare coverage for unknown reason and patient will be unable to afford if not covered.  Iron studies today as well.    4.  Hypertension:  With recent increase in creatinine, hold on ACE I and start Metoprolol 25 mg twice daily.   Follow up in 1 week for bp check  5.  DM:  Hold on starting Metformin until know he is not using alcohol and after reevaluating renal function.  A1C and urine microalbumin/crea.  6.  CKD:  Repeat BMP  7.  Poor vision:  Eye referral /and for diabetic eye check.  Not clear when he might be placed in Assisted Living.  Will continue to follow until then

## 2017-03-17 LAB — BASIC METABOLIC PANEL
BUN/Creatinine Ratio: 8 — ABNORMAL LOW (ref 10–24)
BUN: 14 mg/dL (ref 8–27)
CO2: 27 mmol/L (ref 20–29)
CREATININE: 1.78 mg/dL — AB (ref 0.76–1.27)
Calcium: 8.4 mg/dL — ABNORMAL LOW (ref 8.6–10.2)
Chloride: 101 mmol/L (ref 96–106)
GFR, EST AFRICAN AMERICAN: 41 mL/min/{1.73_m2} — AB (ref >59)
GFR, EST NON AFRICAN AMERICAN: 36 mL/min/{1.73_m2} — AB (ref >59)
GLUCOSE: 209 mg/dL — AB (ref 65–99)
Potassium: 4.1 mmol/L (ref 3.5–5.2)
SODIUM: 139 mmol/L (ref 134–144)

## 2017-03-17 LAB — HGB A1C W/O EAG: Hgb A1c MFr Bld: 6.7 % — ABNORMAL HIGH (ref 4.8–5.6)

## 2017-03-17 LAB — MICROALBUMIN / CREATININE URINE RATIO
Creatinine, Urine: 17.7 mg/dL
Microalb/Creat Ratio: 5302.3 mg/g creat — ABNORMAL HIGH (ref 0.0–30.0)
Microalbumin, Urine: 938.5 ug/mL

## 2017-03-17 LAB — IRON AND TIBC
IRON: 35 ug/dL — AB (ref 38–169)
Iron Saturation: 19 % (ref 15–55)
Total Iron Binding Capacity: 187 ug/dL — ABNORMAL LOW (ref 250–450)
UIBC: 152 ug/dL (ref 111–343)

## 2017-03-17 LAB — FOLATE RBC
FOLATE, RBC: 1232 ng/mL (ref >498)
Folate, Hemolysate: 376.9 ng/mL
Hematocrit: 30.6 % — ABNORMAL LOW (ref 37.5–51.0)

## 2017-03-18 ENCOUNTER — Ambulatory Visit: Payer: Self-pay | Admitting: Emergency Medicine

## 2017-03-23 ENCOUNTER — Telehealth: Payer: Self-pay | Admitting: Internal Medicine

## 2017-03-23 ENCOUNTER — Ambulatory Visit (INDEPENDENT_AMBULATORY_CARE_PROVIDER_SITE_OTHER): Payer: Medicare Other

## 2017-03-23 VITALS — BP 130/88 | HR 72

## 2017-03-23 DIAGNOSIS — I10 Essential (primary) hypertension: Secondary | ICD-10-CM | POA: Diagnosis not present

## 2017-03-23 NOTE — Progress Notes (Signed)
Informed patient to continue with current medication and if any changes needs to be made we will contact him. Per Dr. Amil Amen have patient continue current regimen

## 2017-03-24 NOTE — Progress Notes (Signed)
Appoint ment scheduled for Dr. Katy Fitch on 03/30/17 @ 9:45 am. Referral and OV notes and demographics faxed to Dr. Patrici Ranks office.

## 2017-03-25 NOTE — Telephone Encounter (Signed)
To Antony Madura to schedule appointment

## 2017-04-06 ENCOUNTER — Telehealth: Payer: Self-pay | Admitting: Internal Medicine

## 2017-04-06 NOTE — Telephone Encounter (Signed)
Timothy [name] (Education officer, museum) Patient was seen by doctor Posey Pronto and was informed that his diabetes is out of control and he needs to be on medication. Patient was scheduled for an OV

## 2017-04-08 ENCOUNTER — Ambulatory Visit (INDEPENDENT_AMBULATORY_CARE_PROVIDER_SITE_OTHER): Payer: Medicare Other | Admitting: Internal Medicine

## 2017-04-08 ENCOUNTER — Encounter: Payer: Self-pay | Admitting: Internal Medicine

## 2017-04-08 VITALS — BP 160/98 | HR 70 | Resp 12 | Ht 60.0 in | Wt 95.0 lb

## 2017-04-08 DIAGNOSIS — R7989 Other specified abnormal findings of blood chemistry: Secondary | ICD-10-CM

## 2017-04-08 DIAGNOSIS — H269 Unspecified cataract: Secondary | ICD-10-CM | POA: Insufficient documentation

## 2017-04-08 DIAGNOSIS — E11319 Type 2 diabetes mellitus with unspecified diabetic retinopathy without macular edema: Secondary | ICD-10-CM

## 2017-04-08 DIAGNOSIS — E1122 Type 2 diabetes mellitus with diabetic chronic kidney disease: Secondary | ICD-10-CM

## 2017-04-08 DIAGNOSIS — Z23 Encounter for immunization: Secondary | ICD-10-CM

## 2017-04-08 DIAGNOSIS — N189 Chronic kidney disease, unspecified: Secondary | ICD-10-CM

## 2017-04-08 HISTORY — DX: Unspecified cataract: H26.9

## 2017-04-08 MED ORDER — LISINOPRIL 2.5 MG PO TABS: 2.5000 mg | ORAL_TABLET | Freq: Every day | ORAL | 11 refills | Status: DC

## 2017-04-08 NOTE — Progress Notes (Signed)
   Subjective:    Patient ID: Timothy [name], male    DOB: 1940-07-26, 76 y.o.   MRN: [id number]  HPI   1.  Diabetic Retinopathy/cataracts:  Reportedly with significant diabetic retinopathy.  Did see Dr. Posey Pronto beginning of this week on Monday.  Reportedly Dr. Posey Pronto felt he should be on medication for DM.  Discussed A1C was at goal at 6.7% and with ongoing mild transaminitis with history of alcohol intake and renal insufficiency, would like to control with diet and avoid Metformin for now.   As diet improves and hopefully, continues to stay away from alcohol, may need medication more so. Does have significant albuminuria Blood glucose good today at 147. Has not had pneumococcal vaccine 23 valent.  2.  Essential Hypertension:  No change in how he feels with bp medication and vitamins.  BP had improved when in for nurse check.  Up today.  Denies missing Metoprolol  3.  Placement:  Working with [facility] to find a spot for him in Assisted Living.  Looking at [facility]  Current Meds  Medication Sig  . ferrous gluconate (FERGON) 324 MG tablet Take 1 tablet (324 mg total) by mouth daily with breakfast.  . folic acid (FOLVITE) 1 MG tablet Take 1 tablet (1 mg total) by mouth daily.  . metoprolol tartrate (LOPRESSOR) 25 MG tablet Take 1 tablet (25 mg total) by mouth 2 (two) times daily.  . Multiple Vitamin (MULTIVITAMIN WITH MINERALS) TABS tablet Take 1 tablet by mouth daily.  Marland Kitchen thiamine 100 MG tablet Take 1 tablet (100 mg total) by mouth daily.    No Known Allergies    Review of Systems     Objective:   Physical Exam NAD Lungs:  CTA CV:  RRR without murmur or rub, radial pulses normal and equal       Assessment & Plan:  1.  DM:  Hold on medication for now as A1C at 6.7 % and with mildly elevated transaminases and CKD Does have albuminuria significantly so, so need to stay under 7.0% and control BP. Pneumococcal 23 valent vaccine  2.  Hypertension:  High again today.   Perhaps anxious when being seen by doctor.  Start low dose Lisinopril for albuminuria/bp  and follow kidney function closely.  BMP in 1 week.    3.  Eye disease per Dr. Posey Pronto.  Will likely change care to provider with assisted living where he will be moving.

## 2017-04-09 LAB — BASIC METABOLIC PANEL
BUN/Creatinine Ratio: 9 — ABNORMAL LOW (ref 10–24)
BUN: 12 mg/dL (ref 8–27)
CALCIUM: 9.3 mg/dL (ref 8.6–10.2)
CO2: 28 mmol/L (ref 20–29)
CREATININE: 1.37 mg/dL — AB (ref 0.76–1.27)
Chloride: 102 mmol/L (ref 96–106)
GFR calc Af Amer: 57 mL/min/{1.73_m2} — ABNORMAL LOW (ref >59)
GFR, EST NON AFRICAN AMERICAN: 49 mL/min/{1.73_m2} — AB (ref >59)
Glucose: 147 mg/dL — ABNORMAL HIGH (ref 65–99)
POTASSIUM: 4.5 mmol/L (ref 3.5–5.2)
Sodium: 141 mmol/L (ref 134–144)

## 2017-04-16 ENCOUNTER — Ambulatory Visit (INDEPENDENT_AMBULATORY_CARE_PROVIDER_SITE_OTHER): Payer: Medicare Other

## 2017-04-16 VITALS — BP 138/92 | HR 72

## 2017-04-16 DIAGNOSIS — I10 Essential (primary) hypertension: Secondary | ICD-10-CM

## 2017-04-16 DIAGNOSIS — Z79899 Other long term (current) drug therapy: Secondary | ICD-10-CM

## 2017-04-17 LAB — BASIC METABOLIC PANEL
BUN/Creatinine Ratio: 9 — ABNORMAL LOW (ref 10–24)
BUN: 14 mg/dL (ref 8–27)
CALCIUM: 8.7 mg/dL (ref 8.6–10.2)
CO2: 29 mmol/L (ref 20–29)
CREATININE: 1.51 mg/dL — AB (ref 0.76–1.27)
Chloride: 103 mmol/L (ref 96–106)
GFR, EST AFRICAN AMERICAN: 50 mL/min/{1.73_m2} — AB (ref >59)
GFR, EST NON AFRICAN AMERICAN: 44 mL/min/{1.73_m2} — AB (ref >59)
Glucose: 257 mg/dL — ABNORMAL HIGH (ref 65–99)
POTASSIUM: 4 mmol/L (ref 3.5–5.2)
Sodium: 143 mmol/L (ref 134–144)

## 2017-05-04 ENCOUNTER — Ambulatory Visit (INDEPENDENT_AMBULATORY_CARE_PROVIDER_SITE_OTHER): Payer: Medicare Other | Admitting: Internal Medicine

## 2017-05-04 ENCOUNTER — Encounter: Payer: Self-pay | Admitting: Internal Medicine

## 2017-05-04 VITALS — BP 140/88 | HR 68 | Resp 12 | Ht 60.0 in | Wt 94.0 lb

## 2017-05-04 DIAGNOSIS — I10 Essential (primary) hypertension: Secondary | ICD-10-CM

## 2017-05-04 DIAGNOSIS — K029 Dental caries, unspecified: Secondary | ICD-10-CM

## 2017-05-04 DIAGNOSIS — E11319 Type 2 diabetes mellitus with unspecified diabetic retinopathy without macular edema: Secondary | ICD-10-CM

## 2017-05-04 DIAGNOSIS — E1122 Type 2 diabetes mellitus with diabetic chronic kidney disease: Secondary | ICD-10-CM

## 2017-05-04 MED ORDER — RAMIPRIL 2.5 MG PO CAPS: 2.5000 mg | ORAL_CAPSULE | Freq: Every day | ORAL | 11 refills | Status: DC

## 2017-05-04 MED ORDER — METFORMIN HCL 500 MG PO TABS: ORAL_TABLET | ORAL | 11 refills | Status: DC

## 2017-05-04 NOTE — Progress Notes (Addendum)
Subjective:    Patient ID: Timothy [name], male    DOB: 1940-11-24, 76 y.o.   MRN: [id number]  HPI   1.  Essential Hypertension:  Complaining 2 weeks ago that he felt tired, and apparently a decision was made to stop the Lisinopril, which he is taking also for urine microalbuminuria.  2.  DM:  A1C on 03/16/2017 was 6.7%.   Medication was not started as his A1C was at goal and has hepatic dysfunction as well as renal insufficiency. Discussed diabetes, short term and long term effects and why we treat.   Discussed his vision and kidney difficulties are a result of his diabetes in the past, even though right now at goal.  3.  Dry cough:  Started about 3 days ago.  No production.  No dyspnea or fever.  4.  Dental decay:  [facility] is working on getting him dental care supplement through Medicare  Current Meds  Medication Sig  . ferrous gluconate (FERGON) 324 MG tablet Take 1 tablet (324 mg total) by mouth daily with breakfast.  . folic acid (FOLVITE) 1 MG tablet Take 1 tablet (1 mg total) by mouth daily.  . metoprolol tartrate (LOPRESSOR) 25 MG tablet Take 1 tablet (25 mg total) by mouth 2 (two) times daily.  . Multiple Vitamin (MULTIVITAMIN WITH MINERALS) TABS tablet Take 1 tablet by mouth daily.  Marland Kitchen thiamine 100 MG tablet Take 1 tablet (100 mg total) by mouth daily.   No Known Allergies   Review of Systems     Objective:   Physical Exam   HEENT:  TMs pearly gray, retracted, throat without injection.  Clear nasal discharge. Neck:  Supple, No adenopathy Chest:  CTA with decreased BS throughout, no wheezing. CV:  RRR without murmur or rub, radial pulses normal and equal. LE:  No edema  Glucose today:  469        Assessment & Plan:  1.  DM:  Patient is reportedly no longer drinking alcohol and most recent transaminases from 02/11/2017 were normal, supporting this report.  He will also be moving to an assisted living situation tomorrow where this will be more closely monitored.  His  most recent GFR after being on ACE I was 44 on 04/16/2017, so above the 30 ml/min recommended for Metformin use. Despite his A1C about 6 weeks ago at goal, will go ahead and start him on Metformin 250 mg twice daily with meals. He will need to have monitoring for signs and symptoms of lactic acidosis as well as monitoring for worsening kidney function or dehydration, reinitiation of alcohol intake, etc, all of which can increase his risk for lactic acidosis while on Metformin. Discussed decreasing his rice intake, and switch to brown or wild rice.   To continue to work on fiber with carbs. He is unable to monitor his sugars on his own due to diabetic retinopathy. He should have this checked twice daily before breakfast and dinner.   He has received his influenza Vaccine:  02/13/2017 Pneumococcal (23V)  04/08/2017 Tdap  11/15/13     2.  Hypertension with diabetic nephropathy:  Restart an ACE I with Ramipril 2.5 mg also $4 per month at [facility].   He will need repeat BMP in 1-2 weeks. Goal is to have bp in 120/70 range and control protein loss through renal system.  3.  Dental decay:  Looking for coverage to have dental status evaluated and treated with Medicare supplement.  4.  Cough:  Mild URI.  Drink fluids.  5.  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  Bilateral cataracts.  Plans for intravitreal anti-VEGF injections, tractional retinal detachment repair of the left eye and pa retinal photocoagulation of both eyes.  This with Dr. Jalene Mullet at [facility] in [place].    He will have the following address starting tomorrow: [place] [address]  [place], [place] [address] [phone] Fax:  [phone]  Will fax a copy of this note

## 2017-05-04 NOTE — Addendum Note (Signed)
Addended by: Marcelino Duster on: 05/04/2017 03:52 PM   Modules accepted: Orders

## 2017-05-22 ENCOUNTER — Ambulatory Visit: Payer: Medicare Other | Admitting: Emergency Medicine

## 2017-06-24 ENCOUNTER — Encounter (HOSPITAL_COMMUNITY): Payer: Self-pay | Admitting: *Deleted

## 2017-06-24 ENCOUNTER — Ambulatory Visit: Payer: Self-pay | Admitting: Ophthalmology

## 2017-06-24 ENCOUNTER — Other Ambulatory Visit: Payer: Self-pay

## 2017-06-24 NOTE — H&P (Signed)
  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 Alcohol abuse    Patient denies, but was shared with people from [facility] by his male roommate.  . Bilateral cataracts 04/08/2017  . DM (diabetes mellitus), type 2 with renal complications ([place])    uncertain when diagnosed  . HOH (hard of hearing)   . Hypertension   . Legally blind    B/L  . Poor vision     Pertinent ocular history:  Severe proliferative diabetic retinopathy  Pertinent family history: No family history on file.  General:  Healthy appearing patient in no distress.    Eyes:    Acuity OD 20/100  OS CF    External: Within normal limits     Anterior segment: Within normal limits       Fundus: Severe proliferative diabetic retinopathy left eye with tractional retinal detachment.      Impression: Severe PDR with TRD left eye  Plan: Tractional retinal detachment repair left eye  Royston Cowper

## 2017-06-24 NOTE — Progress Notes (Signed)
Pt SDW-pre-op call completed by pt social worker, Education officer, environmental. Please complete anesthesia assessment and Apnea Screening DOS; unable to assess. Pre-op instructions faxed and reviewed with [facility] [place] where pt resides. Supv. verbalized understanding of all pre-op instructions.

## 2017-06-24 NOTE — Pre-Procedure Instructions (Signed)
Timothy [name]  06/24/2017      [place], [place] - [address] [place] [address] Phone: [phone] Fax: [phone]    Your procedure is scheduled on Thursday, June 25, 2017  Report to [facility] Admitting at 5:30 A.M.  Call this number if you have problems the morning of surgery:  [phone]   Remember:  Do not eat food or drink liquids after midnight.  Take these medicines the morning of surgery with A SIP OF WATER : metoprolol tartrate (LOPRESSOR) Stop taking Aspirin, vitamins, fish oil and herbal medications. Do not take any NSAIDs ie: Ibuprofen, Advil, Naproxen (Aleve), Motrin, BC and Goody Powder; stop now.    How to Manage Your Diabetes Before and After Surgery  Why is it important to control my blood sugar before and after surgery? . Improving blood sugar levels before and after surgery helps healing and can limit problems. . A way of improving blood sugar control is eating a healthy diet by: o  Eating less sugar and carbohydrates o  Increasing activity/exercise o  Talking with your doctor about reaching your blood sugar goals . High blood sugars (greater than 180 mg/dL) can raise your risk of infections and slow your recovery, so you will need to focus on controlling your diabetes during the weeks before surgery. . Make sure that the doctor who takes care of your diabetes knows about your planned surgery including the date and location.  How do I manage my blood sugar before surgery? . Check your blood sugar at least 4 times a day, starting 2 days before surgery, to make sure that the level is not too high or low. o Check your blood sugar the morning of your surgery when you wake up and every 2 hours until you get to the Short Stay unit. . If your blood sugar is less than 70 mg/dL, you will need to treat for low blood sugar: o Do not take insulin. o Treat a low blood sugar (less than 70  mg/dL) with  cup of clear juice (cranberry or apple), 4 glucose tablets, OR glucose gel. Recheck blood sugar in 15 minutes after treatment (to make sure it is greater than 70 mg/dL). If your blood sugar is not greater than 70 mg/dL on recheck, call [phone] o  for further instructions. . Report your blood sugar to the short stay nurse when you get to Short Stay.  . If you are admitted to the hospital after surgery: o Your blood sugar will be checked by the staff and you will probably be given insulin after surgery (instead of oral diabetes medicines) to make sure you have good blood sugar levels. o The goal for blood sugar control after surgery is 80-180 mg/dL.  WHAT DO I DO ABOUT MY DIABETES MEDICATION?   Marland Kitchen Do not  take oral diabetes medicines (pills) the morning of surgery such as metFORMIN (GLUCOPHAGE)  Reviewed and Endorsed by [facility] Patient Education Committee, August 2015  Do not wear jewelry, make-up or nail polish.  Do not wear lotions, powders, or perfumes, or deodorant.  Do not shave 48 hours prior to surgery.  Men may shave face and neck.  Do not bring valuables to the hospital.  [facility] is not responsible for any belongings or valuables.  Contacts, dentures or bridgework may not be worn into surgery.  Leave your suitcase in the car.  After surgery it may be brought  to your room. For patients admitted to the hospital, discharge time will be determined by your treatment team. Patients discharged the day of surgery will not be allowed to drive home.  Please read over the following fact sheets that you were given.

## 2017-06-24 NOTE — Anesthesia Preprocedure Evaluation (Addendum)
Anesthesia Evaluation  Patient identified by MRN, date of birth, ID band Patient awake    Reviewed: Allergy & Precautions, NPO status , Patient's Chart, lab work & pertinent test results  Airway Mallampati: II  TM Distance: >3 FB Neck ROM: Full    Dental  (+) Dental Advisory Given, Poor Dentition, Missing   Pulmonary former smoker,    Pulmonary exam normal breath sounds clear to auscultation       Cardiovascular hypertension, Pt. on medications Normal cardiovascular exam Rhythm:Regular Rate:Normal     Neuro/Psych Legally blind negative psych ROS   GI/Hepatic negative GI ROS, ?hx EtOH abuse   Endo/Other  diabetes, Type 2  Renal/GU negative Renal ROS  negative genitourinary   Musculoskeletal negative musculoskeletal ROS (+)   Abdominal   Peds  Hematology negative hematology ROS (+)   Anesthesia Other Findings   Reproductive/Obstetrics                            Anesthesia Physical Anesthesia Plan  ASA: IV  Anesthesia Plan: General   Post-op Pain Management:    Induction: Intravenous  PONV Risk Score and Plan: 2 and Treatment may vary due to age or medical condition, Propofol infusion and Ondansetron  Airway Management Planned: Oral ETT  Additional Equipment: None  Intra-op Plan:   Post-operative Plan: Extubation in OR  Informed Consent: I have reviewed the patients History and Physical, chart, labs and discussed the procedure including the risks, benefits and alternatives for the proposed anesthesia with the patient or authorized representative who has indicated his/her understanding and acceptance.   Dental advisory given  Plan Discussed with: CRNA and Surgeon  Anesthesia Plan Comments:       Anesthesia Quick Evaluation

## 2017-06-25 ENCOUNTER — Ambulatory Visit (HOSPITAL_COMMUNITY): Payer: Medicare Other | Admitting: Certified Registered Nurse Anesthetist

## 2017-06-25 ENCOUNTER — Other Ambulatory Visit (HOSPITAL_COMMUNITY): Payer: Medicare Other

## 2017-06-25 ENCOUNTER — Inpatient Hospital Stay (HOSPITAL_COMMUNITY)
Admission: AD | Admit: 2017-06-25 | Discharge: 2017-07-01 | DRG: 988 | Disposition: A | Discharge status: Home / Self Care | Payer: Medicare Other | Source: Ambulatory Visit | Attending: Family Medicine | Admitting: Family Medicine

## 2017-06-25 ENCOUNTER — Encounter (HOSPITAL_COMMUNITY)
Admission: AD | Disposition: A | Discharge status: Home / Self Care | Payer: Self-pay | Source: Ambulatory Visit | Attending: Internal Medicine

## 2017-06-25 ENCOUNTER — Inpatient Hospital Stay (HOSPITAL_COMMUNITY): Payer: Medicare Other

## 2017-06-25 DIAGNOSIS — N179 Acute kidney failure, unspecified: Secondary | ICD-10-CM | POA: Diagnosis present

## 2017-06-25 DIAGNOSIS — E1136 Type 2 diabetes mellitus with diabetic cataract: Secondary | ICD-10-CM | POA: Diagnosis present

## 2017-06-25 DIAGNOSIS — H269 Unspecified cataract: Secondary | ICD-10-CM | POA: Diagnosis not present

## 2017-06-25 DIAGNOSIS — I161 Hypertensive emergency: Principal | ICD-10-CM | POA: Diagnosis present

## 2017-06-25 DIAGNOSIS — F1010 Alcohol abuse, uncomplicated: Secondary | ICD-10-CM | POA: Diagnosis not present

## 2017-06-25 DIAGNOSIS — H9190 Unspecified hearing loss, unspecified ear: Secondary | ICD-10-CM | POA: Diagnosis present

## 2017-06-25 DIAGNOSIS — H548 Legal blindness, as defined in USA: Secondary | ICD-10-CM | POA: Diagnosis present

## 2017-06-25 DIAGNOSIS — D631 Anemia in chronic kidney disease: Secondary | ICD-10-CM | POA: Diagnosis present

## 2017-06-25 DIAGNOSIS — H3322 Serous retinal detachment, left eye: Secondary | ICD-10-CM

## 2017-06-25 DIAGNOSIS — Z590 Homelessness: Secondary | ICD-10-CM

## 2017-06-25 DIAGNOSIS — H3342 Traction detachment of retina, left eye: Secondary | ICD-10-CM | POA: Diagnosis present

## 2017-06-25 DIAGNOSIS — N183 Chronic kidney disease, stage 3 (moderate): Secondary | ICD-10-CM | POA: Diagnosis present

## 2017-06-25 DIAGNOSIS — I129 Hypertensive chronic kidney disease with stage 1 through stage 4 chronic kidney disease, or unspecified chronic kidney disease: Secondary | ICD-10-CM | POA: Diagnosis present

## 2017-06-25 DIAGNOSIS — Z87820 Personal history of traumatic brain injury: Secondary | ICD-10-CM

## 2017-06-25 DIAGNOSIS — S069XAA Unspecified intracranial injury with loss of consciousness status unknown, initial encounter: Secondary | ICD-10-CM | POA: Diagnosis present

## 2017-06-25 DIAGNOSIS — N189 Chronic kidney disease, unspecified: Secondary | ICD-10-CM

## 2017-06-25 DIAGNOSIS — E113593 Type 2 diabetes mellitus with proliferative diabetic retinopathy without macular edema, bilateral: Secondary | ICD-10-CM | POA: Diagnosis present

## 2017-06-25 DIAGNOSIS — E1122 Type 2 diabetes mellitus with diabetic chronic kidney disease: Secondary | ICD-10-CM | POA: Diagnosis present

## 2017-06-25 DIAGNOSIS — Z87891 Personal history of nicotine dependence: Secondary | ICD-10-CM

## 2017-06-25 DIAGNOSIS — D638 Anemia in other chronic diseases classified elsewhere: Secondary | ICD-10-CM | POA: Diagnosis present

## 2017-06-25 DIAGNOSIS — Z7984 Long term (current) use of oral hypoglycemic drugs: Secondary | ICD-10-CM

## 2017-06-25 DIAGNOSIS — E875 Hyperkalemia: Secondary | ICD-10-CM | POA: Diagnosis present

## 2017-06-25 DIAGNOSIS — E11319 Type 2 diabetes mellitus with unspecified diabetic retinopathy without macular edema: Secondary | ICD-10-CM | POA: Diagnosis present

## 2017-06-25 DIAGNOSIS — I10 Essential (primary) hypertension: Secondary | ICD-10-CM | POA: Diagnosis present

## 2017-06-25 DIAGNOSIS — S069X9A Unspecified intracranial injury with loss of consciousness of unspecified duration, initial encounter: Secondary | ICD-10-CM | POA: Diagnosis present

## 2017-06-25 DIAGNOSIS — R748 Abnormal levels of other serum enzymes: Secondary | ICD-10-CM | POA: Diagnosis not present

## 2017-06-25 DIAGNOSIS — Z9114 Patient's other noncompliance with medication regimen: Secondary | ICD-10-CM

## 2017-06-25 HISTORY — DX: Essential (primary) hypertension: I10

## 2017-06-25 HISTORY — DX: Unspecified hearing loss, unspecified ear: H91.90

## 2017-06-25 HISTORY — DX: Legal blindness, as defined in USA: H54.8

## 2017-06-25 LAB — BASIC METABOLIC PANEL
ANION GAP: 11 (ref 5–15)
Anion gap: 12 (ref 5–15)
BUN: 39 mg/dL — ABNORMAL HIGH (ref 6–20)
BUN: 35 mg/dL — ABNORMAL HIGH (ref 6–20)
CALCIUM: 9.2 mg/dL (ref 8.9–10.3)
CALCIUM: 9.8 mg/dL (ref 8.9–10.3)
CHLORIDE: 106 mmol/L (ref 101–111)
CO2: 19 mmol/L — ABNORMAL LOW (ref 22–32)
CO2: 21 mmol/L — ABNORMAL LOW (ref 22–32)
CREATININE: 2.17 mg/dL — AB (ref 0.61–1.24)
CREATININE: 1.9 mg/dL — AB (ref 0.61–1.24)
Chloride: 105 mmol/L (ref 101–111)
GFR, EST AFRICAN AMERICAN: 32 mL/min — AB (ref >60)
GFR, EST AFRICAN AMERICAN: 37 mL/min — AB (ref >60)
GFR, EST NON AFRICAN AMERICAN: 27 mL/min — AB (ref >60)
GFR, EST NON AFRICAN AMERICAN: 32 mL/min — AB (ref >60)
Glucose, Bld: 123 mg/dL — ABNORMAL HIGH (ref 65–99)
Glucose, Bld: 96 mg/dL (ref 65–99)
Potassium: 6.1 mmol/L — ABNORMAL HIGH (ref 3.5–5.1)
Potassium: 5.1 mmol/L (ref 3.5–5.1)
SODIUM: 137 mmol/L (ref 135–145)
SODIUM: 137 mmol/L (ref 135–145)

## 2017-06-25 LAB — IRON AND TIBC
Iron: 23 ug/dL — ABNORMAL LOW (ref 45–182)
Saturation Ratios: 9 % — ABNORMAL LOW (ref 17.9–39.5)
TIBC: 245 ug/dL — ABNORMAL LOW (ref 250–450)
UIBC: 222 ug/dL

## 2017-06-25 LAB — TROPONIN I
TROPONIN I: 0.03 ng/mL — AB (ref <0.03)
TROPONIN I: 0.07 ng/mL — AB (ref <0.03)
Troponin I: 0.03 ng/mL (ref <0.03)

## 2017-06-25 LAB — GLUCOSE, CAPILLARY
GLUCOSE-CAPILLARY: 129 mg/dL — AB (ref 65–99)
GLUCOSE-CAPILLARY: 102 mg/dL — AB (ref 65–99)
GLUCOSE-CAPILLARY: 129 mg/dL — AB (ref 65–99)
Glucose-Capillary: 85 mg/dL (ref 65–99)

## 2017-06-25 LAB — HEMOGLOBIN: HEMOGLOBIN: 10.9 g/dL — AB (ref 13.0–17.0)

## 2017-06-25 LAB — TSH: TSH: 2.429 u[IU]/mL (ref 0.350–4.500)

## 2017-06-25 LAB — MAGNESIUM: Magnesium: 2.2 mg/dL (ref 1.7–2.4)

## 2017-06-25 LAB — CREATININE, SERUM
CREATININE: 2.16 mg/dL — AB (ref 0.61–1.24)
GFR, EST AFRICAN AMERICAN: 32 mL/min — AB (ref >60)
GFR, EST NON AFRICAN AMERICAN: 28 mL/min — AB (ref >60)

## 2017-06-25 LAB — VITAMIN B12: Vitamin B-12: 357 pg/mL (ref 180–914)

## 2017-06-25 LAB — HEMOGLOBIN A1C
HEMOGLOBIN A1C: 7.1 % — AB (ref 4.8–5.6)
Mean Plasma Glucose: 157.07 mg/dL

## 2017-06-25 LAB — RETICULOCYTES
RBC.: 4.35 MIL/uL (ref 4.22–5.81)
RETIC COUNT ABSOLUTE: 39.2 10*3/uL (ref 19.0–186.0)
RETIC CT PCT: 0.9 % (ref 0.4–3.1)

## 2017-06-25 LAB — FOLATE: Folate: 60.9 ng/mL (ref >5.9)

## 2017-06-25 LAB — FERRITIN: FERRITIN: 38 ng/mL (ref 24–336)

## 2017-06-25 SURGERY — CANCELLED PROCEDURE
Anesthesia: Monitor Anesthesia Care | Laterality: Left

## 2017-06-25 MED ORDER — SODIUM POLYSTYRENE SULFONATE PO POWD
30.0000 g | Freq: Once | ORAL | Status: AC
  Administered 2017-06-25: 30 g via ORAL
  Filled 2017-06-25: qty 30

## 2017-06-25 MED ORDER — LABETALOL HCL 5 MG/ML IV SOLN: 10.0000 mg | Freq: Once | INTRAVENOUS | Status: DC

## 2017-06-25 MED ORDER — LACTATED RINGERS IV SOLN
INTRAVENOUS | Status: DC | PRN
  Administered 2017-06-25: 07:00:00 via INTRAVENOUS

## 2017-06-25 MED ORDER — ONDANSETRON HCL 4 MG/2ML IJ SOLN: 4.0000 mg | Freq: Once | INTRAMUSCULAR | Status: DC | PRN

## 2017-06-25 MED ORDER — INSULIN ASPART 100 UNIT/ML ~~LOC~~ SOLN
SUBCUTANEOUS | Status: AC
  Filled 2017-06-25: qty 1

## 2017-06-25 MED ORDER — DEXTROSE 50 % IV SOLN
INTRAVENOUS | Status: AC
  Administered 2017-06-25: 50 mL via INTRAVENOUS
  Filled 2017-06-25: qty 50

## 2017-06-25 MED ORDER — HEPARIN SODIUM (PORCINE) 5000 UNIT/ML IJ SOLN
5000.0000 [IU] | Freq: Three times a day (TID) | INTRAMUSCULAR | Status: DC
  Administered 2017-06-25 – 2017-07-01 (×15): 5000 [IU] via SUBCUTANEOUS
  Filled 2017-06-25 (×17): qty 1

## 2017-06-25 MED ORDER — INSULIN ASPART 100 UNIT/ML ~~LOC~~ SOLN: 0.0000 [IU] | Freq: Every day | SUBCUTANEOUS | Status: DC

## 2017-06-25 MED ORDER — ACETAMINOPHEN 325 MG PO TABS: 650.0000 mg | ORAL_TABLET | Freq: Four times a day (QID) | ORAL | Status: DC | PRN

## 2017-06-25 MED ORDER — BUPIVACAINE HCL (PF) 0.75 % IJ SOLN
INTRAMUSCULAR | Status: AC
  Filled 2017-06-25: qty 10

## 2017-06-25 MED ORDER — MIDAZOLAM HCL 2 MG/2ML IJ SOLN
INTRAMUSCULAR | Status: AC
  Filled 2017-06-25: qty 2

## 2017-06-25 MED ORDER — DEXAMETHASONE SODIUM PHOSPHATE 10 MG/ML IJ SOLN
INTRAMUSCULAR | Status: AC
  Filled 2017-06-25: qty 1

## 2017-06-25 MED ORDER — CEFAZOLIN SUBCONJUNCTIVAL INJECTION 100 MG/0.5 ML
100.0000 mg | INJECTION | SUBCONJUNCTIVAL | Status: DC
  Filled 2017-06-25: qty 5

## 2017-06-25 MED ORDER — EPINEPHRINE PF 1 MG/ML IJ SOLN
INTRAMUSCULAR | Status: AC
  Filled 2017-06-25: qty 1

## 2017-06-25 MED ORDER — SODIUM CHLORIDE 0.9 % IV SOLN
2.0000 g | Freq: Once | INTRAVENOUS | Status: AC
  Administered 2017-06-25: 2 g via INTRAVENOUS
  Filled 2017-06-25: qty 20

## 2017-06-25 MED ORDER — BSS PLUS IO SOLN
INTRAOCULAR | Status: AC
  Filled 2017-06-25: qty 500

## 2017-06-25 MED ORDER — FERROUS GLUCONATE 324 (38 FE) MG PO TABS
324.0000 mg | ORAL_TABLET | Freq: Every day | ORAL | Status: DC
  Administered 2017-06-26 – 2017-07-01 (×6): 324 mg via ORAL
  Filled 2017-06-25 (×6): qty 1

## 2017-06-25 MED ORDER — HYALURONIDASE HUMAN 150 UNIT/ML IJ SOLN
INTRAMUSCULAR | Status: AC
  Filled 2017-06-25: qty 1

## 2017-06-25 MED ORDER — SODIUM POLYSTYRENE SULFONATE 15 GM/60ML PO SUSP: 30.0000 g | Freq: Once | ORAL | Status: DC

## 2017-06-25 MED ORDER — HYDROCHLOROTHIAZIDE 12.5 MG PO CAPS
12.5000 mg | ORAL_CAPSULE | Freq: Two times a day (BID) | ORAL | Status: DC
  Administered 2017-06-25: 12.5 mg via ORAL
  Filled 2017-06-25: qty 1

## 2017-06-25 MED ORDER — HYPROMELLOSE (GONIOSCOPIC) 2.5 % OP SOLN
OPHTHALMIC | Status: AC
  Filled 2017-06-25: qty 15

## 2017-06-25 MED ORDER — CHLORTHALIDONE 50 MG PO TABS
50.0000 mg | ORAL_TABLET | Freq: Every day | ORAL | Status: DC
  Administered 2017-06-25 – 2017-06-26 (×2): 50 mg via ORAL
  Filled 2017-06-25 (×2): qty 1

## 2017-06-25 MED ORDER — INSULIN ASPART 100 UNIT/ML ~~LOC~~ SOLN
0.0000 [IU] | Freq: Three times a day (TID) | SUBCUTANEOUS | Status: DC
  Administered 2017-06-26 – 2017-06-27 (×2): 1 [IU] via SUBCUTANEOUS
  Administered 2017-06-28: 2 [IU] via SUBCUTANEOUS
  Administered 2017-06-30: 1 [IU] via SUBCUTANEOUS
  Administered 2017-06-30: 2 [IU] via SUBCUTANEOUS
  Administered 2017-06-30: 3 [IU] via SUBCUTANEOUS

## 2017-06-25 MED ORDER — HYDRALAZINE HCL 10 MG PO TABS
10.0000 mg | ORAL_TABLET | Freq: Four times a day (QID) | ORAL | Status: DC
  Administered 2017-06-25: 10 mg via ORAL
  Filled 2017-06-25: qty 1

## 2017-06-25 MED ORDER — LABETALOL HCL 5 MG/ML IV SOLN
10.0000 mg | Freq: Once | INTRAVENOUS | Status: AC
  Administered 2017-06-25: 10 mg via INTRAVENOUS
  Filled 2017-06-25: qty 4

## 2017-06-25 MED ORDER — OXYCODONE HCL 5 MG/5ML PO SOLN: 5.0000 mg | Freq: Once | ORAL | Status: DC | PRN

## 2017-06-25 MED ORDER — HYDRALAZINE HCL 20 MG/ML IJ SOLN
10.0000 mg | INTRAMUSCULAR | Status: DC | PRN
  Administered 2017-06-25: 10 mg via INTRAVENOUS
  Filled 2017-06-25: qty 1

## 2017-06-25 MED ORDER — THIAMINE HCL 100 MG PO TABS: 100.0000 mg | ORAL_TABLET | Freq: Every day | ORAL | Status: DC

## 2017-06-25 MED ORDER — ATROPINE SULFATE 1 % OP SOLN
OPHTHALMIC | Status: AC
  Filled 2017-06-25: qty 5

## 2017-06-25 MED ORDER — ONDANSETRON HCL 4 MG/2ML IJ SOLN: 4.0000 mg | Freq: Four times a day (QID) | INTRAMUSCULAR | Status: DC | PRN

## 2017-06-25 MED ORDER — ONDANSETRON HCL 4 MG/2ML IJ SOLN
INTRAMUSCULAR | Status: AC
  Filled 2017-06-25: qty 2

## 2017-06-25 MED ORDER — OXYCODONE HCL 5 MG PO TABS: 5.0000 mg | ORAL_TABLET | Freq: Once | ORAL | Status: DC | PRN

## 2017-06-25 MED ORDER — OFLOXACIN 0.3 % OP SOLN
1.0000 [drp] | OPHTHALMIC | Status: AC | PRN
  Administered 2017-06-25 (×2): 1 [drp] via OPHTHALMIC
  Filled 2017-06-25: qty 5

## 2017-06-25 MED ORDER — CYCLOPENTOLATE HCL 1 % OP SOLN
1.0000 [drp] | OPHTHALMIC | Status: DC | PRN
  Administered 2017-06-25 (×2): 1 [drp] via OPHTHALMIC
  Filled 2017-06-25: qty 2

## 2017-06-25 MED ORDER — AMLODIPINE BESYLATE 10 MG PO TABS
10.0000 mg | ORAL_TABLET | Freq: Every day | ORAL | Status: DC
  Administered 2017-06-26 – 2017-07-01 (×6): 10 mg via ORAL
  Filled 2017-06-25 (×6): qty 1

## 2017-06-25 MED ORDER — ACETAMINOPHEN 650 MG RE SUPP
650.0000 mg | Freq: Four times a day (QID) | RECTAL | Status: DC | PRN
Start: 2017-06-25 — End: 2017-07-01

## 2017-06-25 MED ORDER — SODIUM CHLORIDE 0.9 % IV SOLN: INTRAVENOUS | Status: AC

## 2017-06-25 MED ORDER — FENTANYL CITRATE (PF) 100 MCG/2ML IJ SOLN: 25.0000 ug | INTRAMUSCULAR | Status: DC | PRN

## 2017-06-25 MED ORDER — FENTANYL CITRATE (PF) 250 MCG/5ML IJ SOLN
INTRAMUSCULAR | Status: AC
  Filled 2017-06-25: qty 5

## 2017-06-25 MED ORDER — AMLODIPINE BESYLATE 5 MG PO TABS
5.0000 mg | ORAL_TABLET | Freq: Every day | ORAL | Status: DC
  Administered 2017-06-25: 5 mg via ORAL
  Filled 2017-06-25: qty 1

## 2017-06-25 MED ORDER — LIDOCAINE HCL 2 % IJ SOLN
INTRAMUSCULAR | Status: AC
  Filled 2017-06-25: qty 20

## 2017-06-25 MED ORDER — DEXTROSE 50 % IV SOLN
1.0000 | Freq: Once | INTRAVENOUS | Status: AC
  Administered 2017-06-25: 50 mL via INTRAVENOUS

## 2017-06-25 MED ORDER — PHENYLEPHRINE HCL 10 % OP SOLN
1.0000 [drp] | OPHTHALMIC | Status: AC | PRN
  Administered 2017-06-25: 1 [drp] via OPHTHALMIC
  Filled 2017-06-25: qty 5

## 2017-06-25 MED ORDER — DOCUSATE SODIUM 100 MG PO CAPS
100.0000 mg | ORAL_CAPSULE | Freq: Two times a day (BID) | ORAL | Status: AC
  Administered 2017-06-26: 100 mg via ORAL
  Filled 2017-06-25 (×2): qty 1

## 2017-06-25 MED ORDER — TROPICAMIDE 1 % OP SOLN
OPHTHALMIC | Status: AC
  Filled 2017-06-25: qty 15

## 2017-06-25 MED ORDER — PROPOFOL 10 MG/ML IV BOLUS
INTRAVENOUS | Status: AC
  Filled 2017-06-25: qty 20

## 2017-06-25 MED ORDER — LABETALOL HCL 5 MG/ML IV SOLN
0.5000 mg/min | INTRAVENOUS | Status: DC
  Filled 2017-06-25: qty 100

## 2017-06-25 MED ORDER — ONDANSETRON HCL 4 MG PO TABS: 4.0000 mg | ORAL_TABLET | Freq: Four times a day (QID) | ORAL | Status: DC | PRN

## 2017-06-25 MED ORDER — FOLIC ACID 1 MG PO TABS
1.0000 mg | ORAL_TABLET | Freq: Every day | ORAL | Status: DC
  Administered 2017-06-25 – 2017-07-01 (×6): 1 mg via ORAL
  Filled 2017-06-25 (×6): qty 1

## 2017-06-25 MED ORDER — BSS IO SOLN
INTRAOCULAR | Status: AC
  Filled 2017-06-25: qty 15

## 2017-06-25 MED ORDER — ADULT MULTIVITAMIN W/MINERALS CH
1.0000 | ORAL_TABLET | Freq: Every day | ORAL | Status: DC
  Administered 2017-06-25 – 2017-07-01 (×6): 1 via ORAL
  Filled 2017-06-25 (×6): qty 1

## 2017-06-25 MED ORDER — HYDRALAZINE HCL 50 MG PO TABS
50.0000 mg | ORAL_TABLET | Freq: Three times a day (TID) | ORAL | Status: DC
  Administered 2017-06-25 – 2017-06-27 (×4): 50 mg via ORAL
  Filled 2017-06-25 (×5): qty 1

## 2017-06-25 MED ORDER — TOBRAMYCIN-DEXAMETHASONE 0.3-0.1 % OP OINT
TOPICAL_OINTMENT | OPHTHALMIC | Status: AC
  Filled 2017-06-25: qty 3.5

## 2017-06-25 MED ORDER — VITAMIN B-1 100 MG PO TABS
100.0000 mg | ORAL_TABLET | Freq: Every day | ORAL | Status: DC
  Administered 2017-06-25 – 2017-07-01 (×6): 100 mg via ORAL
  Filled 2017-06-25 (×6): qty 1

## 2017-06-25 MED ORDER — LABETALOL HCL 5 MG/ML IV SOLN
INTRAVENOUS | Status: AC
  Filled 2017-06-25: qty 4

## 2017-06-25 MED ORDER — INSULIN ASPART 100 UNIT/ML ~~LOC~~ SOLN
10.0000 [IU] | Freq: Once | SUBCUTANEOUS | Status: AC
  Administered 2017-06-25: 10 [IU] via INTRAVENOUS

## 2017-06-25 SURGICAL SUPPLY — 51 items
APPLICATOR COTTON TIP 6IN STRL (MISCELLANEOUS) IMPLANT
BLADE MVR KNIFE 20G (BLADE) IMPLANT
CANNULA ANT CHAM MAIN (OPHTHALMIC RELATED) IMPLANT
CANNULA DUAL BORE 23G (CANNULA) IMPLANT
CANNULA DUALBORE 25G (CANNULA) IMPLANT
CANNULA VLV SOFT TIP 25GA (OPHTHALMIC) IMPLANT
CAUTERY EYE LOW TEMP 1300F FIN (OPHTHALMIC RELATED) IMPLANT
CLOSURE STERI-STRIP 1/2X4 (GAUZE/BANDAGES/DRESSINGS) ×1
CLSR STERI-STRIP ANTIMIC 1/2X4 (GAUZE/BANDAGES/DRESSINGS) ×2 IMPLANT
COVER MAYO STAND STRL (DRAPES) IMPLANT
DRAPE HALF SHEET 40X57 (DRAPES) ×3 IMPLANT
DRAPE INCISE 51X51 W/FILM STRL (DRAPES) IMPLANT
DRAPE RETRACTOR (MISCELLANEOUS) ×3 IMPLANT
ERASER HMR WETFIELD 23G BP (MISCELLANEOUS) IMPLANT
FORCEPS ECKARDT ILM 25G SERR (OPHTHALMIC RELATED) IMPLANT
FORCEPS GRIESHABER ILM 25G A (INSTRUMENTS) IMPLANT
GAS AUTO FILL CONSTEL (OPHTHALMIC)
GAS AUTO FILL CONSTELLATION (OPHTHALMIC) IMPLANT
GLOVE ECLIPSE 7.5 STRL STRAW (GLOVE) ×3 IMPLANT
GOWN STRL REUS W/ TWL LRG LVL3 (GOWN DISPOSABLE) ×1 IMPLANT
GOWN STRL REUS W/TWL LRG LVL3 (GOWN DISPOSABLE) ×2
KIT BASIN OR (CUSTOM PROCEDURE TRAY) ×3 IMPLANT
KIT TURNOVER KIT B (KITS) ×3 IMPLANT
LENS BIOM SUPER VIEW SET DISP (OPHTHALMIC RELATED) IMPLANT
MICROPICK 25G (MISCELLANEOUS)
NEEDLE 18GX1X1/2 (RX/OR ONLY) (NEEDLE) ×3 IMPLANT
NEEDLE 25GX 5/8IN NON SAFETY (NEEDLE) ×3 IMPLANT
NEEDLE FILTER BLUNT 18X 1/2SAF (NEEDLE) ×2
NEEDLE FILTER BLUNT 18X1 1/2 (NEEDLE) ×1 IMPLANT
NEEDLE HYPO 25GX1X1/2 BEV (NEEDLE) IMPLANT
NEEDLE HYPO 30X.5 LL (NEEDLE) ×6 IMPLANT
NEEDLE RETROBULBAR 25GX1.5 (NEEDLE) ×3 IMPLANT
NS IRRIG 1000ML POUR BTL (IV SOLUTION) ×3 IMPLANT
PACK VITRECTOMY CUSTOM (CUSTOM PROCEDURE TRAY) IMPLANT
PAD ARMBOARD 7.5X6 YLW CONV (MISCELLANEOUS) ×6 IMPLANT
PAK PIK VITRECTOMY CVS 25GA (OPHTHALMIC) IMPLANT
PENCIL BIPOLAR 25GA STR DISP (OPHTHALMIC RELATED) IMPLANT
PICK MICROPICK 25G (MISCELLANEOUS) IMPLANT
PROBE LASER ILLUM FLEX CVD 25G (OPHTHALMIC) IMPLANT
ROLLS DENTAL (MISCELLANEOUS) IMPLANT
SCRAPER DIAMOND 25GA (OPHTHALMIC RELATED) IMPLANT
SOLUTION ANTI FOG 6CC (MISCELLANEOUS) ×3 IMPLANT
STOPCOCK 4 WAY LG BORE MALE ST (IV SETS) IMPLANT
SUT VICRYL 7 0 TG140 8 (SUTURE) ×3 IMPLANT
SUT VICRYL 8 0 TG140 8 (SUTURE) IMPLANT
SYR 10ML LL (SYRINGE) IMPLANT
SYR 20CC LL (SYRINGE) ×3 IMPLANT
SYR 5ML LL (SYRINGE) ×3 IMPLANT
SYR TB 1ML LUER SLIP (SYRINGE) IMPLANT
WATER STERILE IRR 1000ML POUR (IV SOLUTION) ×3 IMPLANT
WIPE INSTRUMENT VISIWIPE 73X73 (MISCELLANEOUS) IMPLANT

## 2017-06-25 NOTE — Consult Note (Signed)
Cardiology Consultation:   Patient ID: Timothy [name]; [id number]; [birth year]   Admit date: 06/25/2017 Date of Consult: 06/25/2017  Primary Care Provider: Mack Hook, MD Primary Cardiologist: New to [facility], Dr. Debara Pickett Primary Electrophysiologist:  None   Patient Profile:   Timothy [name] is a 77 y.o. 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 Herbert Moors.  History of Present Illness:   Timothy [name]  is a 77 y.o. 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   Patient has been living in an assisted living facility for the past 6 weeks and reportedly had been doing well. The facility administered his prescription medications including metoprolol 25mg  BID and ramipril 2.5mg  daily. It is unclear if he has been taking his medications appropriately or had routine BP checks in the last 6 weeks. At his last PMD appointment on 05/04/2017 his BP was 140/88, however he was feeling fatigued on lisinopril so he was transitioned to ramipril 2.5mg  daily in addition to metoprolol 25mg  BID for blood pressure control.   A [place] interpreter was present at bedside to assist with history. Unfortunately due to traumatic brain injury, patient is a very poor historian and is oriented to self only. Patient is without complaints at the time of this interview. He has baseline vision loss but has not had any recent changes. He did endorse a headache around 12pm, which lasted for an unknown amount of time, however resolved spontaneously. He denies any chest pain  surrounding these events. Denies CP with exertion, SOB, DOE, orthopnea, PND, LE edema, dizziness, or lightheadedness.   Hospital course: BP 256/84 on arrival, now 164/92. Afebrile, HR stable, satting well on RA. Labs notable for K 6.1, Cr. 2.17 (baseline 1.5), Hgb 10.9, Iron and ferritin low, Troponin 0.03>0.36 (although not documented in results). EKG with sinus rhythm with LVH, TWI in lateral leads (08:21am EKG), which appear to be mildly improved on repeat. Patient was admitted to medicine. Received IV labetolol x1 dose, amlodipine, and po hydralazine with minimal improvement in BP. Cardiology consulted for EKG changes and elevated troponin.     Past Medical History:  Diagnosis Date  . Alcohol abuse    Patient denies, but was shared with people from [facility] by his male roommate.  . Bilateral cataracts 04/08/2017  . DM (diabetes mellitus), type 2 with renal complications ([place])    uncertain when diagnosed  . HOH (hard of hearing)   . Hypertension   . Legally blind    B/L  . Poor vision     History reviewed. No pertinent surgical history.   Home Medications:  Prior to Admission medications   Medication Sig Start Date End Date Taking? Authorizing Provider  ferrous gluconate (FERGON) 324 MG tablet Take 1 tablet (324 mg total) by mouth daily with breakfast. Patient taking differently: Take 324 mg by mouth daily with breakfast. (0800) 03/16/17  Yes Mack Hook, MD  folic acid (FOLVITE) 1 MG tablet Take 1 tablet (1 mg total) by mouth daily. Patient taking differently: Take 1 mg by mouth daily. (0800) 03/16/17  Yes Mack Hook,  MD  metFORMIN (GLUCOPHAGE) 500 MG tablet 1/2 tab by mouth twice daily Patient taking differently: Take 250 mg by mouth 2 (two) times daily. (0800 & 2000) 05/04/17  Yes Mack Hook, MD  metoprolol tartrate (LOPRESSOR) 25 MG tablet Take 1 tablet (25 mg total) by mouth 2 (two) times daily. Patient taking differently: Take 25 mg by mouth 2 (two)  times daily. (0800 & 2000) 03/16/17  Yes Mack Hook, MD  ramipril (ALTACE) 2.5 MG capsule Take 1 capsule (2.5 mg total) by mouth daily. Patient taking differently: Take 2.5 mg by mouth daily. (0800) 05/04/17  Yes Mack Hook, MD  thiamine 100 MG tablet Take 1 tablet (100 mg total) by mouth daily. Patient taking differently: Take 100 mg by mouth daily. (0800) 03/16/17  Yes Mack Hook, MD  Multiple Vitamin (MULTIVITAMIN WITH MINERALS) TABS tablet Take 1 tablet by mouth daily. 11/16/13   [name], PA-C    Inpatient Medications: Scheduled Meds: . amLODipine  5 mg Oral Daily  . docusate sodium  100 mg Oral BID  . [START ON 06/26/2017] ferrous gluconate  324 mg Oral Q breakfast  . folic acid  1 mg Oral Daily  . heparin  5,000 Units Subcutaneous Q8H  . hydrALAZINE  10 mg Oral Q6H  . hydrochlorothiazide  12.5 mg Oral BID  . insulin aspart  0-5 Units Subcutaneous QHS  . insulin aspart  0-9 Units Subcutaneous TID WC  . labetalol      . multivitamin with minerals  1 tablet Oral Daily  . sodium polystyrene  30 g Oral Once  . thiamine  100 mg Oral Daily  . tropicamide       Continuous Infusions: . sodium chloride     PRN Meds: acetaminophen **OR** acetaminophen, fentaNYL (SUBLIMAZE) injection, ondansetron (ZOFRAN) IV, ondansetron **OR** ondansetron (ZOFRAN) IV, oxyCODONE **OR** oxyCODONE  Allergies:   No Known Allergies  Social History:   Social History   Socioeconomic History  . Marital status: Married    Spouse name: Not on file  . Number of children: 4  . Years of education: 0  . Highest education level: Not on file  Social Needs  . Financial resource strain: Not on file  . Food insecurity - worry: Not on file  . Food insecurity - inability: Not on file  . Transportation needs - medical: Not on file  . Transportation needs - non-medical: Not on file  Occupational History  . Occupation: unemployed  Tobacco Use  . Smoking status: Former Smoker     Types: Cigarettes    Last attempt to quit: 05/19/1990    Years since quitting: 27.1  . Smokeless tobacco: Never Used  Substance and Sexual Activity  . 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 Drug use: No  . Sexual activity: Not on file  Other Topics Concern  . Not on file  Social History Narrative   Originally from [place]   [place]   Was in camp in [place] for 3-4 months.   [place].  Initially in [place].   He came here with his nephew and lived together until the house where he was living was sold, then went separate ways.  Not clear how long ago this was.   Hmuin Rcom, with whom he was recently lived with at [place], stated he moved from place to place until kicked out until he lived with  her for past year. (2018)   Used to work in Consulting civil engineer.      Looking at possibility of [employer] or [employer].  [place] male already living there.      Never attended school.  Does not read or write in his language of [place].      Left [place] as fleeing communists.  Denies any physical injury/toruture or imprisonment.   He did fight in the war in [place].     Left his wife behind in their home.                  Family History:   Family history is unknown as patient is a poor historian and has had little family contact in the past 30 years.  History reviewed. No pertinent family history.   ROS:  Please see the history of present illness.   All other ROS reviewed and negative.     Physical Exam/Data:   Vitals:   06/25/17 1215 06/25/17 1230 06/25/17 1245 06/25/17 1300  BP: (!) 220/86 (!) 216/82  (!) 205/79  Pulse: 66 63 68 70  Resp: (!) 28 (!) 22 16 (!) 21  Temp:      TempSrc:      SpO2: 100% 100% 100% 100%    Intake/Output Summary (Last 24 hours) at 06/25/2017 1319 Last data filed at 06/25/2017 1032 Gross per 24 hour  Intake 400 ml  Output -  Net 400 ml   There were  no vitals filed for this visit. There is no height or weight on file to calculate BMI.  General:  Thin, chronically ill appearing elderly gentleman, laying in bed appearing mildly anxious. HEENT: sclera anicteric, poor dentition  Neck: no JVD Vascular: No carotid bruits; distal pulses 2+ bilaterally Cardiac:  normal S1, S2; RRR; no murmur, gallops, or rubs Lungs:  clear to auscultation bilaterally, no wheezing, rhonchi or rales  Abd: NABS, soft, nontender, no hepatomegaly Ext: no edema; multiple small abrasions on b/l LE.   Musculoskeletal:  No deformities, BUE and BLE strength normal and equal Skin: warm and dry  Neuro:  Oriented to self only. CNs 2-12 intact, no focal abnormalities noted Psych:  Anxious appearing   EKG:  The EKG was personally reviewed and demonstrates:  EKG with sinus rhythm with LVH, TWI in lateral leads (08:21am EKG), which appear to be mildly improved on repeat. Telemetry:  Telemetry was personally reviewed and demonstrates:  NSR  Relevant CV Studies: Echo pending  Laboratory Data:  Chemistry Recent Labs  Lab 06/25/17 0648 06/25/17 0950  NA 137  --   K 6.1*  --   CL 106  --   CO2 19*  --   GLUCOSE 123*  --   BUN 39*  --   CREATININE 2.17* 2.16*  CALCIUM 9.2  --   GFRNONAA 27* 28*  GFRAA 32* 32*  ANIONGAP 12  --     No results for input(s): PROT, ALBUMIN, AST, ALT, ALKPHOS, BILITOT in the last 168 hours. Hematology Recent Labs  Lab 06/25/17 0648 06/25/17 1027  RBC  --  4.35  HGB 10.9*  --    Cardiac Enzymes Recent Labs  Lab 06/25/17 1004  TROPONINI 0.03*   No results for input(s): TROPIPOC in the last 168 hours.  BNPNo results for input(s): BNP, PROBNP in the last 168 hours.  DDimer No results for input(s): DDIMER in the last 168 hours.  Radiology/Studies:  No results found.  Assessment and Plan:   1. Elevated  troponin: 0.03> 0.36?(not documented). Mild troponin elevation likely 2/2 demand ischemia in setting of hypertensive  emergency. EKG with sinus rhythm, LVH pattern, and TWI in lateral leads (upright in previous EKG) with mild improvement on repeat EKGs. Possible patient has some underlying CAD; risk factors include: HTN, DM, smoking history, ETOH abuse. Would likely benefit from further ischemic work up, however would like to see improvement in BP and Cr.  - Continue to trend troponin x3 (or to peak) - EKG in AM  - Further ischemic work-up pending troponin trend  - Echo pending to assess for changes in LV function or wall motion abnormalities  2. Hypertensive Emergency: patient presented today for L retinal detachment repair, however was found to have an elevated BP of 256/84. Patient was asymptomatic at the time, however did endorse a mild HA around 12pm which resolved spontaneously. Last BP improved to 164/92. Also with Cr 2.17, up from baseline of 1.5.  - Home metoprolol and ramipril on hold - Continue amlodipine and hydralazine - goal to decrease SBP 25-30% over the next 12 hours.  - Will discontinue HCTZ in setting of AKI - Will start labetalol gtt given poorly controlled BP - plan to convert to po labetalol or coreg once BP stable .   3. Acute on chronic renal insuffiencey: baseline Cr 1.5 - Cr 2.17 today, likely in setting of hypertensive emergency - Continue to monitor closely and avoid nephrotoxic agents  For questions or updates, please contact [place] Please consult [email or web address] for contact info under Cardiology/STEMI.   Signed, Abigail Butts, PA-C  06/25/2017 1:19 PM [phone]

## 2017-06-25 NOTE — Progress Notes (Signed)
Return call from [name] NP, orders written.

## 2017-06-25 NOTE — H&P (Signed)
History and Physical    Timothy [name]:[id number] DOB: Aug 28, 1940 DOA: 06/25/2017  PCP: Mack Hook, MD Patient coming from: alf  Chief Complaint: hypertensive emergency  HPI: Timothy [name] is a 77 y.o. male with medical history significant for hypertension, diabetes, alcohol abuse, traumatic brain injury, subdural hematoma, legally blind, chronic kidney disease gives a for surgery on his right eye that was canceled due to hypertensive emergency. Systolic blood pressure remained over 200 in spite of labetalol. Surgery was canceled [facility] are asked to admit.  Information is obtained from the patient through an interpreter as well as the chart. The last 6 weeks patient has been living at assisted living center. Prior to that he was homeless because the house he was living in was condemned. Chart review indicates 2015 he had a fall suffered a subdural hematoma. Indicates he was found down on the ground and EMS reportedly been drinking and hit his head on the fall. He did not require surgery that was discharged on anti-seizure medication. Chart review indicates he's only been to the emergency department a couple times in the last couple of years with alcohol intoxication. Interpreter at the bedside confirms patient denies headache visual disturbances syncope or near-syncope. He denies chest pain palpitation shortness of breath lower extremity edema. He denies abdominal pain nausea vomiting diarrhea constipation melena bright red blood per rectum. He thinks his last bowel movement was 2 days ago. He denies dysuria hematuria frequency or urgency. Case manager at the bedside reports he has been at assisted living for a proximal moist 6 weeks and has been getting his medications during that time.    ED Course: Preop area his systolic blood pressures greater than 200 otherwise hemodynamically stable and not hypoxic. He is provided with one dose of labetalol per anesthesia. At the time of  admission  Review of Systems: As per HPI otherwise all other systems reviewed and are negative.   Ambulatory Status:" Ambulates independently currently a resident of assisted living reportedly getting assistance with ADLs and medications  Past Medical History:  Diagnosis Date  . Alcohol abuse    Patient denies, but was shared with people from [facility] by his male roommate.  . Bilateral cataracts 04/08/2017  . DM (diabetes mellitus), type 2 with renal complications ([place])    uncertain when diagnosed  . HOH (hard of hearing)   . Hypertension   . Legally blind    B/L  . Poor vision     History reviewed. No pertinent surgical history.  Social History   Socioeconomic History  . Marital status: Married    Spouse name: Not on file  . Number of children: 4  . Years of education: 0  . Highest education level: Not on file  Social Needs  . Financial resource strain: Not on file  . Food insecurity - worry: Not on file  . Food insecurity - inability: Not on file  . Transportation needs - medical: Not on file  . Transportation needs - non-medical: Not on file  Occupational History  . Occupation: unemployed  Tobacco Use  . Smoking status: Former Smoker    Types: Cigarettes    Last attempt to quit: 05/19/1990    Years since quitting: 27.1  . Smokeless tobacco: Never Used  Substance and Sexual Activity  . 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 Drug use: No  . Sexual activity: Not on file  Other  Topics Concern  . Not on file  Social History Narrative   Originally from [place]   [place]   Was in camp in [place] for 3-4 months.   [place] In [place].  Initially in [place].   He came here with his nephew and lived together until the house where he was living was sold, then went separate ways.  Not clear how long ago this was.   Hmuin Rcom, with whom he was recently lived with at [place],  stated he moved from place to place until kicked out until he lived with her for past year. (2018)   Used to work in Consulting civil engineer.      Looking at possibility of [employer] or [employer].  [place] male already living there.      Never attended school.  Does not read or write in his language of [place].      Left [place] as fleeing communists.  Denies any physical injury/toruture or imprisonment.   He did fight in the war in [place].     Left his wife behind in their home.                  No Known Allergies  History reviewed. No pertinent family history.  Prior to Admission medications   Medication Sig Start Date End Date Taking? Authorizing Provider  ferrous gluconate (FERGON) 324 MG tablet Take 1 tablet (324 mg total) by mouth daily with breakfast. Patient taking differently: Take 324 mg by mouth daily with breakfast. (0800) 03/16/17  Yes Mack Hook, MD  folic acid (FOLVITE) 1 MG tablet Take 1 tablet (1 mg total) by mouth daily. Patient taking differently: Take 1 mg by mouth daily. (0800) 03/16/17  Yes Mack Hook, MD  metFORMIN (GLUCOPHAGE) 500 MG tablet 1/2 tab by mouth twice daily Patient taking differently: Take 250 mg by mouth 2 (two) times daily. (0800 & 2000) 05/04/17  Yes Mack Hook, MD  metoprolol tartrate (LOPRESSOR) 25 MG tablet Take 1 tablet (25 mg total) by mouth 2 (two) times daily. Patient taking differently: Take 25 mg by mouth 2 (two) times daily. (0800 & 2000) 03/16/17  Yes Mack Hook, MD  ramipril (ALTACE) 2.5 MG capsule Take 1 capsule (2.5 mg total) by mouth daily. Patient taking differently: Take 2.5 mg by mouth daily. (0800) 05/04/17  Yes Mack Hook, MD  thiamine 100 MG tablet Take 1 tablet (100 mg total) by mouth daily. Patient taking differently: Take 100 mg by mouth daily. (0800) 03/16/17  Yes Mack Hook, MD  Multiple Vitamin (MULTIVITAMIN WITH MINERALS) TABS tablet Take 1 tablet by mouth daily. 11/16/13    [name], PA-C    Physical Exam: Vitals:   06/25/17 0830 06/25/17 0845 06/25/17 0900 06/25/17 0915  BP: (!) 223/77 (!) 210/78 (!) 216/78   Pulse: 65 71 68 65  Resp: 19 (!) 22 18 (!) 21  Temp:      TempSrc:      SpO2: 100% 95% 100% 99%     General:  Appears calm and comfortable sitting up in bed in no acute distress Eyes:  PERRL, EOMI, normal lids, iris ENT:  grossly normal hearing, lips & tongue, very poor dentition Neck:  no LAD, masses or thyromegaly Cardiovascular:  RRR, no m/r/g. No LE edema.  Respiratory:  CTA bilaterally, no w/r/r. Normal respiratory effort. Abdomen:  soft, ntnd, NABS Skin:  no rash or induration seen on limited exam Musculoskeletal:  grossly normal tone BUE/BLE, good ROM,  no bony abnormality Psychiatric:  grossly normal mood and affect, speech fluent and appropriate, AOx3 Neurologic:  Oriented to self only able to follow simple commands through interpreter. Bilateral grip 5 out of 5. Moving all extremities tongue midline no facial droop   Labs on Admission: I have personally reviewed following labs and imaging studies  CBC: Recent Labs  Lab 06/25/17 0648  HGB 93.7*   Basic Metabolic Panel: Recent Labs  Lab 06/25/17 0648  NA 137  K 6.1*  CL 106  CO2 19*  GLUCOSE 123*  BUN 39*  CREATININE 2.17*  CALCIUM 9.2   GFR: CrCl cannot be calculated (Unknown ideal weight.). Liver Function Tests: No results for input(s): AST, ALT, ALKPHOS, BILITOT, PROT, ALBUMIN in the last 168 hours. No results for input(s): LIPASE, AMYLASE in the last 168 hours. No results for input(s): AMMONIA in the last 168 hours. Coagulation Profile: No results for input(s): INR, PROTIME in the last 168 hours. Cardiac Enzymes: No results for input(s): CKTOTAL, CKMB, CKMBINDEX, TROPONINI in the last 168 hours. BNP (last 3 results) No results for input(s): PROBNP in the last 8760 hours. HbA1C: No results for input(s): HGBA1C in the last 72 hours. CBG: Recent Labs    Lab 06/25/17 0636 06/25/17 0840  GLUCAP 129* 102*   Lipid Profile: No results for input(s): CHOL, HDL, LDLCALC, TRIG, CHOLHDL, LDLDIRECT in the last 72 hours. Thyroid Function Tests: No results for input(s): TSH, T4TOTAL, FREET4, T3FREE, THYROIDAB in the last 72 hours. Anemia Panel: No results for input(s): VITAMINB12, FOLATE, FERRITIN, TIBC, IRON, RETICCTPCT in the last 72 hours. Urine analysis:    Component Value Date/Time   COLORURINE YELLOW 11/15/2013 0231   APPEARANCEUR CLEAR 11/15/2013 0231   LABSPEC 1.011 11/15/2013 0231   PHURINE 5.5 11/15/2013 0231   GLUCOSEU >1000 (A) 11/15/2013 0231   HGBUR MODERATE (A) 11/15/2013 0231   BILIRUBINUR NEGATIVE 11/15/2013 0231   KETONESUR NEGATIVE 11/15/2013 0231   PROTEINUR 30 (A) 11/15/2013 0231   UROBILINOGEN 0.2 11/15/2013 0231   NITRITE NEGATIVE 11/15/2013 0231   LEUKOCYTESUR TRACE (A) 11/15/2013 0231    Creatinine Clearance: CrCl cannot be calculated (Unknown ideal weight.).  Sepsis Labs: @LABRCNTIP (procalcitonin:4,lacticidven:4) )No results found for this or any previous visit (from the past 240 hour(s)).   Radiological Exams on Admission: No results found.  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 Hypertensive emergency. Etiology unclear but suspect given that he's been homeless up until 6 weeks ago related to noncompliance with medications. Family home medications are metoprolol and ramipril. He has an elevated potassium as an abnormal EKG.  -Admit to telemetry  -Obtain a 2-D echo  -Cycle troponins   -repeat ekg in am -2gm calcium gluconate  -We'll start hydralazine and amlodipine.  -Monitor closely  -Consider adding hydrochlorothiazide pending on results of above   #2. Acute kidney injury superimposed on chronic kidney disease stage III. Creatinine greater than 2 on admission. Chart review indicates baseline closer 1.5. Likely related to #1. -Hold nephrotoxins -Monitor urine output -Gentle IV fluids -Obtain a renal ultrasound  #3. Hyperkalemia/abnormal ekg. Potassium level VI.1 on admission. EKG as  noted above.  -monitor on tele -2gm calcium gluconate -10 unit insulin IV -I amp d50 -Kayexalate -Recheck this afternoon  #4. Diabetes. Home medications include metformin. Glucose 123 on admission. -We'll hold metformin for now -Carb modified diet -Sliding scale insulin for optimal control -Obtain a hemoglobin A1c  #5. Anemia of chronic disease. Hemoglobin 10.9 on admission. Chart review indicates this is close to his baseline. -We'll obtain an anemia panel -fobt -monitor  #6. Closed traumatic brain injury. Had a subdural hematoma after a fall in 2015. Case manager reports he is currently at his baseline which is oriented to self only.  #7. diabetic retinopathy. He was scheduled for surgery on the right eye today. This was canceled as noted above -Outpatient follow-up for reschedule    DVT prophylaxis: heparin  Code Status: full  Family Communication: none present  Disposition Plan: back to alf  Consults called: none  Admission status: inpatient    Radene Gunning MD [facility]  If 7PM-7AM, please contact night-coverage [email or web address] Password [email or web address]  06/25/2017, 9:50 AM

## 2017-06-25 NOTE — Progress Notes (Signed)
[name] NP paged via Shea Evans re: trop I results

## 2017-06-25 NOTE — Progress Notes (Signed)
Pt arrived to 4e from [place].  Does not speak [place] Production assistant, radio at bedside.  Telemetry monitor applied.  Vital Signs taken.  Oriented to room with translator assistance.  Will continue with current care plan. Curlene Labrum RN

## 2017-06-25 NOTE — Progress Notes (Signed)
[name] NP notified of 12 lead EKG results, also notified that SBP is back up to 200's, she said she put a Cardiology consult.

## 2017-06-25 NOTE — Addendum Note (Signed)
Addendum  created 06/25/17 0856 by Colin Benton, CRNA   Charge Capture section accepted, Intraprocedure Event edited, Intraprocedure Flowsheets edited, Intraprocedure Meds edited, Intraprocedure Staff edited, Sign clinical note, Visit diagnoses modified

## 2017-06-25 NOTE — Transfer of Care (Signed)
Immediate Anesthesia Transfer of Care Note  Patient: Timothy [name]  Procedure(s) Performed: CASE CANCELLED - VITRECTOMY WITH 25 GUAGE MEMBRANE PILL WITH GAS, AIR SILICONE OIL AND PHOTO COAGULATION LEFT EYE (Left )  Patient Location: PACU  Anesthesia Type:No procedure performed.  No anesthesia given.  Level of Consciousness: awake, alert , oriented and patient cooperative  Airway & Oxygen Therapy: Patient Spontanous Breathing  Post-op Assessment: Report given to RN, Post -op Vital signs reviewed and stable and Patient moving all extremities X 4  Post vital signs: Reviewed and stable  Last Vitals:  Vitals:   06/25/17 0830 06/25/17 0845  BP: (!) 223/77   Pulse: 65 71  Resp: 19 (!) 22  Temp:    SpO2: 100% 95%    Last Pain:  Vitals:   06/25/17 0808  TempSrc:   PainSc: 0-No pain         Complications: No apparent anesthesia complications

## 2017-06-25 NOTE — Progress Notes (Signed)
PATIENT WAS ABLE TO GIVE CONSENT TO HAVE SURGERY ON LEFT EYE.  (LANGUAGE RESOURCES HERE TO INTERPRET).  BOTH DR. Fransisco Beau AND DR. PATEL AWARE OF ELEVATED BP.  LABETALOL 10 MG IV GIVEN AS ORDERED.

## 2017-06-25 NOTE — Progress Notes (Signed)
[name] NP paged re: tropI value

## 2017-06-26 ENCOUNTER — Other Ambulatory Visit: Payer: Self-pay | Admitting: Cardiology

## 2017-06-26 ENCOUNTER — Inpatient Hospital Stay (HOSPITAL_COMMUNITY): Payer: Medicare Other

## 2017-06-26 DIAGNOSIS — R748 Abnormal levels of other serum enzymes: Secondary | ICD-10-CM

## 2017-06-26 DIAGNOSIS — I161 Hypertensive emergency: Secondary | ICD-10-CM | POA: Diagnosis not present

## 2017-06-26 DIAGNOSIS — H548 Legal blindness, as defined in USA: Secondary | ICD-10-CM

## 2017-06-26 DIAGNOSIS — E875 Hyperkalemia: Secondary | ICD-10-CM

## 2017-06-26 DIAGNOSIS — F1010 Alcohol abuse, uncomplicated: Secondary | ICD-10-CM | POA: Diagnosis not present

## 2017-06-26 DIAGNOSIS — E11319 Type 2 diabetes mellitus with unspecified diabetic retinopathy without macular edema: Secondary | ICD-10-CM | POA: Diagnosis not present

## 2017-06-26 DIAGNOSIS — H269 Unspecified cataract: Secondary | ICD-10-CM | POA: Diagnosis not present

## 2017-06-26 DIAGNOSIS — N189 Chronic kidney disease, unspecified: Secondary | ICD-10-CM | POA: Diagnosis not present

## 2017-06-26 DIAGNOSIS — N179 Acute kidney failure, unspecified: Secondary | ICD-10-CM | POA: Diagnosis not present

## 2017-06-26 DIAGNOSIS — R778 Other specified abnormalities of plasma proteins: Secondary | ICD-10-CM

## 2017-06-26 DIAGNOSIS — D631 Anemia in chronic kidney disease: Secondary | ICD-10-CM | POA: Diagnosis not present

## 2017-06-26 DIAGNOSIS — R7989 Other specified abnormal findings of blood chemistry: Principal | ICD-10-CM

## 2017-06-26 DIAGNOSIS — H3322 Serous retinal detachment, left eye: Secondary | ICD-10-CM | POA: Diagnosis not present

## 2017-06-26 DIAGNOSIS — I10 Essential (primary) hypertension: Secondary | ICD-10-CM | POA: Diagnosis present

## 2017-06-26 LAB — ECHOCARDIOGRAM COMPLETE
HEIGHTINCHES: 61 in
WEIGHTICAEL: 1428.8 [oz_av]

## 2017-06-26 LAB — GLUCOSE, CAPILLARY
GLUCOSE-CAPILLARY: 146 mg/dL — AB (ref 65–99)
GLUCOSE-CAPILLARY: 103 mg/dL — AB (ref 65–99)
GLUCOSE-CAPILLARY: 115 mg/dL — AB (ref 65–99)
Glucose-Capillary: 144 mg/dL — ABNORMAL HIGH (ref 65–99)

## 2017-06-26 LAB — CBC
HEMATOCRIT: 33 % — AB (ref 39.0–52.0)
HEMOGLOBIN: 10.5 g/dL — AB (ref 13.0–17.0)
MCH: 23.9 pg — AB (ref 26.0–34.0)
MCHC: 31.8 g/dL (ref 30.0–36.0)
MCV: 75 fL — AB (ref 78.0–100.0)
Platelets: 350 10*3/uL (ref 150–400)
RBC: 4.4 MIL/uL (ref 4.22–5.81)
RDW: 14.5 % (ref 11.5–15.5)
WBC: 13.8 10*3/uL — ABNORMAL HIGH (ref 4.0–10.5)

## 2017-06-26 LAB — COMPREHENSIVE METABOLIC PANEL
ALBUMIN: 2.8 g/dL — AB (ref 3.5–5.0)
ALK PHOS: 74 U/L (ref 38–126)
ALT: 5 U/L — ABNORMAL LOW (ref 17–63)
ANION GAP: 13 (ref 5–15)
AST: 14 U/L — ABNORMAL LOW (ref 15–41)
BUN: 36 mg/dL — ABNORMAL HIGH (ref 6–20)
CHLORIDE: 105 mmol/L (ref 101–111)
CO2: 20 mmol/L — AB (ref 22–32)
Calcium: 9.3 mg/dL (ref 8.9–10.3)
Creatinine, Ser: 2.07 mg/dL — ABNORMAL HIGH (ref 0.61–1.24)
GFR calc non Af Amer: 29 mL/min — ABNORMAL LOW (ref >60)
GFR, EST AFRICAN AMERICAN: 34 mL/min — AB (ref >60)
GLUCOSE: 133 mg/dL — AB (ref 65–99)
POTASSIUM: 4.5 mmol/L (ref 3.5–5.1)
SODIUM: 138 mmol/L (ref 135–145)
Total Bilirubin: 0.6 mg/dL (ref 0.3–1.2)
Total Protein: 6.4 g/dL — ABNORMAL LOW (ref 6.5–8.1)

## 2017-06-26 MED ORDER — HYDRALAZINE HCL 20 MG/ML IJ SOLN
10.0000 mg | INTRAMUSCULAR | Status: DC | PRN
  Administered 2017-06-30: 10 mg via INTRAVENOUS
  Filled 2017-06-26: qty 1

## 2017-06-26 MED ORDER — CARVEDILOL 6.25 MG PO TABS
6.2500 mg | ORAL_TABLET | Freq: Two times a day (BID) | ORAL | Status: DC
Start: 2017-06-26 — End: 2017-07-01
  Administered 2017-06-26 – 2017-07-01 (×10): 6.25 mg via ORAL
  Filled 2017-06-26 (×10): qty 1

## 2017-06-26 MED ORDER — ASPIRIN 81 MG PO CHEW
81.0000 mg | CHEWABLE_TABLET | Freq: Every day | ORAL | Status: DC
  Administered 2017-06-26 – 2017-07-01 (×6): 81 mg via ORAL
  Filled 2017-06-26 (×6): qty 1

## 2017-06-26 NOTE — Care Management Obs Status (Signed)
[place] NOTIFICATION   Patient Details  Name: Timothy [name] MRN: [id number] Date of Birth: [birth year]   Medicare Observation Status Notification Given:  Yes    Carles Collet, RN 06/26/2017, 3:14 PM

## 2017-06-26 NOTE — Care Management CC44 (Signed)
Condition Code 44 Documentation Completed  Patient Details  Name: Timothy [name] MRN: [id number] Date of Birth: [birth year]   Condition Code 44 given:  Yes Patient signature on Condition Code 44 notice:  Yes Documentation of 2 MD's agreement:  Yes Code 44 added to claim:  Yes    Carles Collet, RN 06/26/2017, 3:14 PM

## 2017-06-26 NOTE — Progress Notes (Signed)
DAILY PROGRESS NOTE   Patient Name: Timothy [name] Date of Encounter: 06/26/2017  Chief Complaint   No overnight events  Patient Profile   Timothy [name] is a 77 y.o. 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 Herbert Moors.  Subjective   BP improved today - not placed on labetalol gtts - appears to have been discontinued by medicine service, I suspect because the floor couldn't administer it or possibly BP improved. Either way, bp improved today. D/w Dr. Ree Kida, plan to start carvedilol in addition to his BP meds. Echo personally reviewed this morning, shows normal LV function. Troponin mildly elevated overnight- more consistent with hypertensive emergency.  Objective   Vitals:   06/25/17 1821 06/25/17 1926 06/26/17 0138 06/26/17 0435  BP: (!) 155/65 (!) 157/103 138/68 124/70  Pulse: 77 85  66  Resp:  _0 Temp:  98.4 F (36.9 C)  97.6 F (36.4 C)  TempSrc:  Oral  Oral  SpO2:  100%  100%  Weight:    89 lb 4.8 oz (40.5 kg)  Height:        Intake/Output Summary (Last 24 hours) at 06/26/2017 1610 Last data filed at 06/25/2017 2125 Gross per 24 hour  Intake 310 ml  Output 300 ml  Net 10 ml   Filed Weights   06/25/17 1605 06/26/17 0435  Weight: 92 lb 4.8 oz (41.9 kg) 89 lb 4.8 oz (40.5 kg)    Physical Exam   General appearance: alert and no distress Neck: no carotid bruit, no JVD and thyroid not enlarged, symmetric, no tenderness/mass/nodules Lungs: clear to auscultation bilaterally Heart: regular rate and rhythm, S1, S2 normal, no murmur, click, rub or gallop Abdomen: soft, non-tender; bowel sounds normal; no masses,  no organomegaly Extremities: extremities normal, atraumatic, no cyanosis or edema Pulses: 2+ and symmetric Skin: Skin color, texture, turgor  normal. No rashes or lesions Neurologic: Grossly normal Psych: Pleasant  Inpatient Medications    Scheduled Meds: . amLODipine  10 mg Oral Daily  . carvedilol  6.25 mg Oral BID WC  . chlorthalidone  50 mg Oral Daily  . docusate sodium  100 mg Oral BID  . ferrous gluconate  324 mg Oral Q breakfast  . folic acid  1 mg Oral Daily  . heparin  5,000 Units Subcutaneous Q8H  . hydrALAZINE  50 mg Oral Q8H  . insulin aspart  0-5 Units Subcutaneous QHS  . insulin aspart  0-9 Units Subcutaneous TID WC  . multivitamin with minerals  1 tablet Oral Daily  . thiamine  100 mg Oral Daily    Continuous Infusions:   PRN Meds: acetaminophen **OR** acetaminophen, hydrALAZINE, ondansetron **OR** ondansetron (ZOFRAN) IV   Labs   Results for orders placed or performed during the hospital encounter of 06/25/17 (from the past 48 hour(s))  Glucose, capillary     Status: Abnormal   Collection Time: 06/25/17  6:36 AM  Result Value Ref Range   Glucose-Capillary 129 (H) 65 - 99 mg/dL   [name] 1 Notify RN    [name] 2 Document in Chart   Basic metabolic panel     Status: Abnormal   Collection Time: 06/25/17  6:48 AM  Result Value Ref Range   Sodium 137 135 - 145 mmol/L   Potassium 6.1 (H)  3.5 - 5.1 mmol/L   Chloride 106 101 - 111 mmol/L   CO2 19 (L) 22 - 32 mmol/L   Glucose, Bld 123 (H) 65 - 99 mg/dL   BUN 39 (H) 6 - 20 mg/dL   Creatinine, Ser 2.17 (H) 0.61 - 1.24 mg/dL   Calcium 9.2 8.9 - 10.3 mg/dL   GFR calc non Af Amer 27 (L) >60 mL/min   GFR calc Af Amer 32 (L) >60 mL/min    [name]: (NOTE) The eGFR has been calculated using the CKD EPI equation. This calculation has not been validated in all clinical situations. eGFR's persistently <60 mL/min signify possible Chronic Kidney Disease.    Anion gap 12 5 - 15    [name]: Performed at [facility] [address], [place], [place] [address]  Hemoglobin     Status: Abnormal   Collection Time: 06/25/17  6:48 AM  Result Value Ref  Range   Hemoglobin 10.9 (L) 13.0 - 17.0 g/dL    [name]: Performed at [facility] [address] [place], [place] [address]  Glucose, capillary     Status: Abnormal   Collection Time: 06/25/17  8:40 AM  Result Value Ref Range   Glucose-Capillary 102 (H) 65 - 99 mg/dL  Creatinine, serum     Status: Abnormal   Collection Time: 06/25/17  9:50 AM  Result Value Ref Range   Creatinine, Ser 2.16 (H) 0.61 - 1.24 mg/dL   GFR calc non Af Amer 28 (L) >60 mL/min   GFR calc Af Amer 32 (L) >60 mL/min    [name]: (NOTE) The eGFR has been calculated using the CKD EPI equation. This calculation has not been validated in all clinical situations. eGFR's persistently <60 mL/min signify possible Chronic Kidney Disease. Performed at [facility], [place] [address], [place], [place] [address]   Magnesium     Status: None   Collection Time: 06/25/17  9:50 AM  Result Value Ref Range   Magnesium 2.2 1.7 - 2.4 mg/dL    [name]: Performed at [facility] [address], [place], [place] [address]  TSH     Status: None   Collection Time: 06/25/17  9:50 AM  Result Value Ref Range   TSH 2.429 0.350 - 4.500 uIU/mL    [name]: Performed by a 3rd Generation assay with a functional sensitivity of <=0.01 uIU/mL. Performed at [facility], [place] [address] [place], [place] [address]   Hemoglobin A1c     Status: Abnormal   Collection Time: 06/25/17  9:50 AM  Result Value Ref Range   Hgb A1c MFr Bld 7.1 (H) 4.8 - 5.6 %    [name]: (NOTE) Pre diabetes:          5.7%-6.4% Diabetes:              >6.4% Glycemic control for   <7.0% adults with diabetes    Mean Plasma Glucose 157.07 mg/dL    [name]: Performed at [facility] [address] [place], [place] [address]  Troponin I (q 6hr x 3)     Status: Abnormal   Collection Time: 06/25/17 10:04 AM  Result Value Ref Range   Troponin I 0.03 (HH) <0.03 ng/mL    [name]: CRITICAL RESULT CALLED TO, READ BACK BY AND VERIFIED  WITHAtilano Ina RN @ 1119 06/25/17 LEONARD,A Performed at [facility], [place] [address] [place],  [address]   Vitamin B12  Status: None   Collection Time: 06/25/17 10:27 AM  Result Value Ref Range   Vitamin B-12 357 180 - 914 pg/mL    [name]: (NOTE) This assay is not validated for testing neonatal or myeloproliferative syndrome specimens for Vitamin B12 levels. Performed at [facility], [place] [address] [place], [place] [address]   Folate     Status: None   Collection Time: 06/25/17 10:27 AM  Result Value Ref Range   Folate 60.9 >5.9 ng/mL    [name]: RESULTS CONFIRMED BY MANUAL DILUTION Performed at [facility], [place] [address] [place], [place] [address]   Iron and TIBC     Status: Abnormal   Collection Time: 06/25/17 10:27 AM  Result Value Ref Range   Iron 23 (L) 45 - 182 ug/dL   TIBC 245 (L) 250 - 450 ug/dL   Saturation Ratios 9 (L) 17.9 - 39.5 %   UIBC 222 ug/dL    [name]: Performed at [facility], [place] [address] [place], [place] [address]  Ferritin     Status: None   Collection Time: 06/25/17 10:27 AM  Result Value Ref Range   Ferritin 38 24 - 336 ng/mL    [name]: Performed at [facility], [place] [address] [place], [place] [address]  Reticulocytes     Status: None   Collection Time: 06/25/17 10:27 AM  Result Value Ref Range   Retic Ct Pct 0.9 0.4 - 3.1 %   RBC. 4.35 4.22 - 5.81 MIL/uL   Retic Count, Absolute 39.2 19.0 - 186.0 K/uL    [name]: Performed at [facility] [address] [place], [place] [address]  Glucose, capillary     Status: None   Collection Time: 06/25/17  4:06 PM  Result Value Ref Range   Glucose-Capillary 85 65 - 99 mg/dL  Basic metabolic panel     Status: Abnormal   Collection Time: 06/25/17  4:16 PM  Result Value Ref Range   Sodium 137 135 - 145 mmol/L   Potassium 5.1 3.5 - 5.1 mmol/L   Chloride 105 101 - 111 mmol/L   CO2 21 (L) 22 - 32 mmol/L   Glucose, Bld 96 65 - 99 mg/dL   BUN 35 (H)  6 - 20 mg/dL   Creatinine, Ser 1.90 (H) 0.61 - 1.24 mg/dL   Calcium 9.8 8.9 - 10.3 mg/dL   GFR calc non Af Amer 32 (L) >60 mL/min   GFR calc Af Amer 37 (L) >60 mL/min    [name]: (NOTE) The eGFR has been calculated using the CKD EPI equation. This calculation has not been validated in all clinical situations. eGFR's persistently <60 mL/min signify possible Chronic Kidney Disease.    Anion gap 11 5 - 15    [name]: Performed at [facility] [address], [place], [place] [address]  Troponin I (q 6hr x 3)     Status: Abnormal   Collection Time: 06/25/17  4:16 PM  Result Value Ref Range   Troponin I 0.03 (HH) <0.03 ng/mL    [name]: CRITICAL VALUE NOTED.  VALUE IS CONSISTENT WITH PREVIOUSLY REPORTED AND CALLED VALUE. Performed at [facility], [place] [address], [place], [place] [address]   Glucose, capillary     Status: Abnormal   Collection Time: 06/25/17  8:59 PM  Result Value Ref Range   Glucose-Capillary 129 (H) 65 - 99 mg/dL  Troponin I (q 6hr x 3)  Status: Abnormal   Collection Time: 06/25/17 10:08 PM  Result Value Ref Range   Troponin I 0.07 (HH) <0.03 ng/mL    [name]: CRITICAL VALUE NOTED.  VALUE IS CONSISTENT WITH PREVIOUSLY REPORTED AND CALLED VALUE. Performed at [facility], [place] [address], [place], [place] [address]   Comprehensive metabolic panel     Status: Abnormal   Collection Time: 06/26/17  2:54 AM  Result Value Ref Range   Sodium 138 135 - 145 mmol/L   Potassium 4.5 3.5 - 5.1 mmol/L   Chloride 105 101 - 111 mmol/L   CO2 20 (L) 22 - 32 mmol/L   Glucose, Bld 133 (H) 65 - 99 mg/dL   BUN 36 (H) 6 - 20 mg/dL   Creatinine, Ser 2.07 (H) 0.61 - 1.24 mg/dL   Calcium 9.3 8.9 - 10.3 mg/dL   Total Protein 6.4 (L) 6.5 - 8.1 g/dL   Albumin 2.8 (L) 3.5 - 5.0 g/dL   AST 14 (L) 15 - 41 U/L   ALT 5 (L) 17 - 63 U/L   Alkaline Phosphatase 74 38 - 126 U/L   Total Bilirubin 0.6 0.3 - 1.2 mg/dL   GFR calc non Af Amer 29 (L) >60 mL/min   GFR calc Af  Amer 34 (L) >60 mL/min    [name]: (NOTE) The eGFR has been calculated using the CKD EPI equation. This calculation has not been validated in all clinical situations. eGFR's persistently <60 mL/min signify possible Chronic Kidney Disease.    Anion gap 13 5 - 15    [name]: Performed at [facility] [address], [place], [place] [address]  CBC     Status: Abnormal   Collection Time: 06/26/17  2:54 AM  Result Value Ref Range   WBC 13.8 (H) 4.0 - 10.5 K/uL   RBC 4.40 4.22 - 5.81 MIL/uL   Hemoglobin 10.5 (L) 13.0 - 17.0 g/dL   HCT 33.0 (L) 39.0 - 52.0 %   MCV 75.0 (L) 78.0 - 100.0 fL   MCH 23.9 (L) 26.0 - 34.0 pg   MCHC 31.8 30.0 - 36.0 g/dL   RDW 14.5 11.5 - 15.5 %   Platelets 350 150 - 400 K/uL    [name]: Performed at [facility], [place] [address] [place], [place] [address]  Glucose, capillary     Status: Abnormal   Collection Time: 06/26/17  5:53 AM  Result Value Ref Range   Glucose-Capillary 146 (H) 65 - 99 mg/dL    ECG   Sinus with PVC's, high voltage in the lateral leads with repolarization abnormality - Personally Reviewed  Telemetry   Sinus rhythm - Personally Reviewed  Radiology    US Renal  Result Date: 06/25/2017 CLINICAL DATA:  Hyperkalemia for 1 day. EXAM: RENAL / URINARY TRACT ULTRASOUND COMPLETE COMPARISON:  None. FINDINGS: Right Kidney: Length: 9.9 cm.  Increased cortical echogenicity. Left Kidney: Length: 10.5 cm.  Increased cortical echogenicity. Bladder: Appears normal for degree of bladder distention. Bilateral ureteral jets were not seen. IMPRESSION: Increased cortical echogenicity of bilateral kidneys, suggestive of intrinsic renal disease. Electronically Signed   By: Fidela Salisbury M.D.   On: 06/25/2017 22:22    Cardiac Studies   LV EF: 55% -   60%  ------------------------------------------------------------------- Indications:      Abnormal EKG  794.31.  ------------------------------------------------------------------- History:   Risk factors:  Hypertension.  ------------------------------------------------------------------- Study Conclusions  - Left ventricle: The cavity size was normal. Wall thickness was   normal.  Systolic function was normal. The estimated ejection   fraction was in the range of 55% to 60%. Wall motion was normal;   there were no regional wall motion abnormalities. Doppler   parameters are consistent with abnormal left ventricular   relaxation (grade 1 diastolic dysfunction). The E/e&' ratio is   between 8-15, suggesting indeterminate LV filling pressure. - Aortic valve: Trileaflet. Sclerosis without stenosis. There was   trivial regurgitation. - Mitral valve: Mildly thickened leaflets . There was trivial   regurgitation. - Left atrium: The atrium was normal in size. - Inferior vena cava: The vessel was normal in size. The   respirophasic diameter changes were in the normal range (>= 50%),   consistent with normal central venous pressure.  Impressions:  - LVEF 55-60%, normal wall thickness, normal wall motion, grade 1   DD, indeterminate LV filling pressure, aortic valve sclerosis   with trivial AI, trivial MR, normal biatrial size, no significant   TR, normal IVC.  Assessment   1. Principal Problem: 2.   Hypertensive emergency 3. Active Problems: 4.   Closed TBI (traumatic brain injury) ([place]) 5.   Anemia in chronic kidney disease 6.   Alcohol abuse 7.   Diabetic retinopathy associated with type 2 diabetes mellitus ([place]) 8.   Bilateral cataracts 9.   Acute kidney injury superimposed on chronic kidney disease ([place]) 10.   Hyperkalemia 11.   Legally blind 12.   Left retinal detachment 13.   Plan   1. BP much improved today - no chest pain or dyspnea. Elevated troponin likely due to hypertensive emergency. Echo is reassuring. Would recommend outpatient lexiscan myoview. Plan to start  carvedilol and low dose aspirin 81 mg daily.   Will arrange for outpatient follow-up after stress testing. No further suggestions at this time. Cardiology will sign-off. Call with questions.  Time Spent Directly with Patient:  I have spent a total of 15 minutes with the patient reviewing hospital notes, telemetry, EKGs, labs and examining the patient as well as establishing an assessment and plan that was discussed personally with the patient. > 50% of time was spent in direct patient care.  Length of Stay:  LOS: 1 day   Pixie Casino, MD, [facility], [place] Director of the Advanced Lipid Disorders &  Cardiovascular Risk Reduction Clinic Diplomate of the American Board of Clinical Lipidology Attending Cardiologist  Direct Dial: [phone]  Fax: [phone]  Website:  [email or web address] 06/26/2017, 9:50 AM

## 2017-06-26 NOTE — Progress Notes (Signed)
  Echocardiogram 2D Echocardiogram has been performed.  Timothy [name] 06/26/2017, 9:34 AM

## 2017-06-26 NOTE — Progress Notes (Signed)
Order for outpatient stress test placed.

## 2017-06-26 NOTE — Progress Notes (Signed)
PROGRESS NOTE    Timothy [name]  HWE:[id number] DOB: 03-01-1941 DOA: 06/25/2017 PCP: Mack Hook, MD   No chief complaint on file.   Brief Narrative:  HPI on 06/25/2017 by [name], NP Timothy [name] is a 77 y.o. male with medical history significant for hypertension, diabetes, alcohol abuse, traumatic brain injury, subdural hematoma, legally blind, chronic kidney disease gives a for surgery on his right eye that was canceled due to hypertensive emergency. Systolic blood pressure remained over 200 in spite of labetalol. Surgery was canceled [facility] are asked to admit.  Information is obtained from the patient through an interpreter as well as the chart. The last 6 weeks patient has been living at assisted living center. Prior to that he was homeless because the house he was living in was condemned. Chart review indicates 2015 he had a fall suffered a subdural hematoma. Indicates he was found down on the ground and EMS reportedly been drinking and hit his head on the fall. He did not require surgery that was discharged on anti-seizure medication. Chart review indicates he's only been to the emergency department a couple times in the last couple of years with alcohol intoxication. Interpreter at the bedside confirms patient denies headache visual disturbances syncope or near-syncope. He denies chest pain palpitation shortness of breath lower extremity edema. He denies abdominal pain nausea vomiting diarrhea constipation melena bright red blood per rectum. He thinks his last bowel movement was 2 days ago. He denies dysuria hematuria frequency or urgency. Case manager at the bedside reports he has been at assisted living for a proximal moist 6 weeks and has been getting his medications during that time.  Interim history Admitted for Accelerated hypertension and acute kidney injury.  Assessment & Plan   Hypertensive emergency  -Patient is supposedly been homeless up until 6 weeks ago,  suspect patient had noncompliance with medications -Blood pressure on admission 236/68, currently appears to have improved -Home medications listed on metoprolol and ramipril -Continue amlodipine, hydralazine oral, IV as needed -Cardiology consulted and appreciated, will add on Coreg 6.25 mg twice daily -hold chlorthalidone  Acute kidney injury on chronic kidney disease, stage III -ACE inhibitor and chlorthalidone held -baseline hemoglobin approxi-1.5-1.7, currently 2.07 -Will continue to monitor BMP -renal ultrasound: Increased cortical echogenicity of bilateral kidneys, suggestive of intrinsic renal disease -Suspect acute kidney injury likely due to uncontrolled hypertension  Elevated troponin -Troponin peaked at 0.07, likely secondary to hypertension -Echocardiogram shows an EF of 89-38%, grade 1 diastolic dysfunction -As above, cardiology consulted and appreciated, recommended addition of Coreg today   As well as outpatient Lexiscan which cardiology will arrange  Diabetes mellitus, type II -Metformin held -continue insulin sliding scale and CBG monitoring -hemoglobin A1c 7.1  Hyperkalemia -Patient given Kayexalate, calcium gluconate, D50 as well as insulin  Anemia of chronic disease -Hemoglobin appears to be stable, continue to monitor CBC  Closed traumatic brain injury -Had subdural hematoma after a fall in 2015. -Patient only oriented to self at baseline  Diabetic retinopathy -Was scheduled for surgery on the right eye on 2/7/219, however canceled due to the above -Patient will need outpatient follow-up for rescheduling  DVT Prophylaxis   heparin  Code Status: Full  Family Communication: None at bedside  Disposition Plan: Admitted, pending improvement in renal function as well as blood pressure  Consultants Cardiology   Procedures  echocardiogram  Antibiotics   Anti-infectives (From admission, onward)   Start     Dose/Rate Route Frequency Ordered Stop  06/25/17 0745  ceFAZolin (ANCEF) 100 mg/0.5 mL subconjunctival injection 100 mg  Status:  Discontinued     100 mg Subconjunctival To Surgery 06/25/17 0733 06/25/17 0941      Subjective:   Timothy [name] seen and examined today.  Patient speaks [place].  He is oriented to self only.  Objective:   Vitals:   06/25/17 1926 06/26/17 0138 06/26/17 0435 06/26/17 1131  BP: (!) 157/103 138/68 124/70 (!) 196/85  Pulse: 85  66 78  Resp: 19 18 17 19   Temp: 98.4 F (36.9 C)  97.6 F (36.4 C) 98.6 F (37 C)  TempSrc: Oral  Oral Oral  SpO2: 100%  100% 98%  Weight:   40.5 kg (89 lb 4.8 oz)   Height:        Intake/Output Summary (Last 24 hours) at 06/26/2017 1150 Last data filed at 06/26/2017 0800 Gross per 24 hour  Intake 210 ml  Output 300 ml  Net -90 ml   Filed Weights   06/25/17 1605 06/26/17 0435  Weight: 41.9 kg (92 lb 4.8 oz) 40.5 kg (89 lb 4.8 oz)    Exam  General: Well developed, well nourished, NAD, appears stated age  HEENT: NCAT, mucous membranes moist.  Poor dentition  Cardiovascular: S1 S2 auscultated, no rubs, murmurs or gallops. Regular rate and rhythm.  Respiratory: Clear to auscultation bilaterally with equal chest rise  Abdomen: Soft, nontender, nondistended, + bowel sounds  Extremities: warm dry without cyanosis clubbing or edema.  Multiple small abrasions on lower extremities  Neuro: AAOx1, nonfocal  Psych: appropriate   Data Reviewed: I have personally reviewed following labs and imaging studies  CBC: Recent Labs  Lab 06/25/17 0648 06/26/17 0254  WBC  --  13.8*  HGB 10.9* 10.5*  HCT  --  33.0*  MCV  --  75.0*  PLT  --  814   Basic Metabolic Panel: Recent Labs  Lab 06/25/17 0648 06/25/17 0950 06/25/17 1616 06/26/17 0254  NA 137  --  137 138  K 6.1*  --  5.1 4.5  CL 106  --  105 105  CO2 19*  --  21* 20*  GLUCOSE 123*  --  96 133*  BUN 39*  --  35* 36*  CREATININE 2.17* 2.16* 1.90* 2.07*  CALCIUM 9.2  --  9.8 9.3  MG  --  2.2  --   --     GFR: Estimated Creatinine Clearance: 16.8 mL/min (A) (by C-G formula based on SCr of 2.07 mg/dL (H)). Liver Function Tests: Recent Labs  Lab 06/26/17 0254  AST 14*  ALT 5*  ALKPHOS 74  BILITOT 0.6  PROT 6.4*  ALBUMIN 2.8*   No results for input(s): LIPASE, AMYLASE in the last 168 hours. No results for input(s): AMMONIA in the last 168 hours. Coagulation Profile: No results for input(s): INR, PROTIME in the last 168 hours. Cardiac Enzymes: Recent Labs  Lab 06/25/17 1004 06/25/17 1616 06/25/17 2208  TROPONINI 0.03* 0.03* 0.07*   BNP (last 3 results) No results for input(s): PROBNP in the last 8760 hours. HbA1C: Recent Labs    06/25/17 0950  HGBA1C 7.1*   CBG: Recent Labs  Lab 06/25/17 0840 06/25/17 1606 06/25/17 2059 06/26/17 0553 06/26/17 1113  GLUCAP 102* 85 129* 146* 103*   Lipid Profile: No results for input(s): CHOL, HDL, LDLCALC, TRIG, CHOLHDL, LDLDIRECT in the last 72 hours. Thyroid Function Tests: Recent Labs    06/25/17 0950  TSH 2.429   Anemia Panel: Recent Labs  06/25/17 1027  VITAMINB12 357  FOLATE 60.9  FERRITIN 38  TIBC 245*  IRON 23*  RETICCTPCT 0.9   Urine analysis:    Component Value Date/Time   COLORURINE YELLOW 11/15/2013 0231   APPEARANCEUR CLEAR 11/15/2013 0231   LABSPEC 1.011 11/15/2013 0231   PHURINE 5.5 11/15/2013 0231   GLUCOSEU >1000 (A) 11/15/2013 0231   HGBUR MODERATE (A) 11/15/2013 0231   BILIRUBINUR NEGATIVE 11/15/2013 0231   KETONESUR NEGATIVE 11/15/2013 0231   PROTEINUR 30 (A) 11/15/2013 0231   UROBILINOGEN 0.2 11/15/2013 0231   NITRITE NEGATIVE 11/15/2013 0231   LEUKOCYTESUR TRACE (A) 11/15/2013 0231   Sepsis Labs: @LABRCNTIP (procalcitonin:4,lacticidven:4)  )No results found for this or any previous visit (from the past 240 hour(s)).    Radiology Studies: US Renal  Result Date: 06/25/2017 CLINICAL DATA:  Hyperkalemia for 1 day. EXAM: RENAL / URINARY TRACT ULTRASOUND COMPLETE COMPARISON:  None.  FINDINGS: Right Kidney: Length: 9.9 cm.  Increased cortical echogenicity. Left Kidney: Length: 10.5 cm.  Increased cortical echogenicity. Bladder: Appears normal for degree of bladder distention. Bilateral ureteral jets were not seen. IMPRESSION: Increased cortical echogenicity of bilateral kidneys, suggestive of intrinsic renal disease. Electronically Signed   By: Fidela Salisbury M.D.   On: 06/25/2017 22:22     Scheduled Meds: . amLODipine  10 mg Oral Daily  . aspirin  81 mg Oral Daily  . carvedilol  6.25 mg Oral BID WC  . chlorthalidone  50 mg Oral Daily  . docusate sodium  100 mg Oral BID  . ferrous gluconate  324 mg Oral Q breakfast  . folic acid  1 mg Oral Daily  . heparin  5,000 Units Subcutaneous Q8H  . hydrALAZINE  50 mg Oral Q8H  . insulin aspart  0-5 Units Subcutaneous QHS  . insulin aspart  0-9 Units Subcutaneous TID WC  . multivitamin with minerals  1 tablet Oral Daily  . thiamine  100 mg Oral Daily   Continuous Infusions:   LOS: 1 day   Time Spent in minutes   30 minutes  Jeannemarie Sawaya D.O. on 06/26/2017 at 11:50 AM  Between 7am to 7pm - Pager - [phone]  After 7pm go to [email or web address] - password [email or web address]  And look for the night coverage person covering for me after hours  [facility] Office  [phone]

## 2017-06-27 DIAGNOSIS — H3342 Traction detachment of retina, left eye: Secondary | ICD-10-CM | POA: Diagnosis present

## 2017-06-27 DIAGNOSIS — I129 Hypertensive chronic kidney disease with stage 1 through stage 4 chronic kidney disease, or unspecified chronic kidney disease: Secondary | ICD-10-CM | POA: Diagnosis present

## 2017-06-27 DIAGNOSIS — Z87820 Personal history of traumatic brain injury: Secondary | ICD-10-CM | POA: Diagnosis not present

## 2017-06-27 DIAGNOSIS — H548 Legal blindness, as defined in USA: Secondary | ICD-10-CM | POA: Diagnosis present

## 2017-06-27 DIAGNOSIS — Z7984 Long term (current) use of oral hypoglycemic drugs: Secondary | ICD-10-CM | POA: Diagnosis not present

## 2017-06-27 DIAGNOSIS — N179 Acute kidney failure, unspecified: Secondary | ICD-10-CM | POA: Diagnosis present

## 2017-06-27 DIAGNOSIS — D631 Anemia in chronic kidney disease: Secondary | ICD-10-CM | POA: Diagnosis present

## 2017-06-27 DIAGNOSIS — E875 Hyperkalemia: Secondary | ICD-10-CM | POA: Diagnosis present

## 2017-06-27 DIAGNOSIS — E1122 Type 2 diabetes mellitus with diabetic chronic kidney disease: Secondary | ICD-10-CM | POA: Diagnosis present

## 2017-06-27 DIAGNOSIS — Z87891 Personal history of nicotine dependence: Secondary | ICD-10-CM | POA: Diagnosis not present

## 2017-06-27 DIAGNOSIS — E113593 Type 2 diabetes mellitus with proliferative diabetic retinopathy without macular edema, bilateral: Secondary | ICD-10-CM | POA: Diagnosis present

## 2017-06-27 DIAGNOSIS — F1010 Alcohol abuse, uncomplicated: Secondary | ICD-10-CM | POA: Diagnosis not present

## 2017-06-27 DIAGNOSIS — I10 Essential (primary) hypertension: Secondary | ICD-10-CM

## 2017-06-27 DIAGNOSIS — Z9114 Patient's other noncompliance with medication regimen: Secondary | ICD-10-CM | POA: Diagnosis not present

## 2017-06-27 DIAGNOSIS — H9190 Unspecified hearing loss, unspecified ear: Secondary | ICD-10-CM | POA: Diagnosis present

## 2017-06-27 DIAGNOSIS — Z590 Homelessness: Secondary | ICD-10-CM | POA: Diagnosis not present

## 2017-06-27 DIAGNOSIS — R748 Abnormal levels of other serum enzymes: Secondary | ICD-10-CM | POA: Diagnosis not present

## 2017-06-27 DIAGNOSIS — N183 Chronic kidney disease, stage 3 (moderate): Secondary | ICD-10-CM | POA: Diagnosis present

## 2017-06-27 DIAGNOSIS — I161 Hypertensive emergency: Secondary | ICD-10-CM | POA: Diagnosis present

## 2017-06-27 DIAGNOSIS — N189 Chronic kidney disease, unspecified: Secondary | ICD-10-CM | POA: Diagnosis not present

## 2017-06-27 DIAGNOSIS — E1136 Type 2 diabetes mellitus with diabetic cataract: Secondary | ICD-10-CM | POA: Diagnosis present

## 2017-06-27 LAB — CBC
HCT: 30.9 % — ABNORMAL LOW (ref 39.0–52.0)
Hemoglobin: 9.8 g/dL — ABNORMAL LOW (ref 13.0–17.0)
MCH: 24.1 pg — AB (ref 26.0–34.0)
MCHC: 31.7 g/dL (ref 30.0–36.0)
MCV: 75.9 fL — ABNORMAL LOW (ref 78.0–100.0)
PLATELETS: 366 10*3/uL (ref 150–400)
RBC: 4.07 MIL/uL — ABNORMAL LOW (ref 4.22–5.81)
RDW: 14.5 % (ref 11.5–15.5)
WBC: 10 10*3/uL (ref 4.0–10.5)

## 2017-06-27 LAB — GLUCOSE, CAPILLARY
GLUCOSE-CAPILLARY: 118 mg/dL — AB (ref 65–99)
Glucose-Capillary: 117 mg/dL — ABNORMAL HIGH (ref 65–99)
Glucose-Capillary: 109 mg/dL — ABNORMAL HIGH (ref 65–99)

## 2017-06-27 LAB — BASIC METABOLIC PANEL
Anion gap: 11 (ref 5–15)
BUN: 39 mg/dL — AB (ref 6–20)
CHLORIDE: 102 mmol/L (ref 101–111)
CO2: 23 mmol/L (ref 22–32)
CREATININE: 2.43 mg/dL — AB (ref 0.61–1.24)
Calcium: 8.6 mg/dL — ABNORMAL LOW (ref 8.9–10.3)
GFR calc Af Amer: 28 mL/min — ABNORMAL LOW (ref >60)
GFR, EST NON AFRICAN AMERICAN: 24 mL/min — AB (ref >60)
Glucose, Bld: 111 mg/dL — ABNORMAL HIGH (ref 65–99)
Potassium: 4.7 mmol/L (ref 3.5–5.1)
SODIUM: 136 mmol/L (ref 135–145)

## 2017-06-27 MED ORDER — SODIUM CHLORIDE 0.9 % IV SOLN
INTRAVENOUS | Status: DC
  Administered 2017-06-27 – 2017-06-30 (×4): via INTRAVENOUS

## 2017-06-27 MED ORDER — HYDRALAZINE HCL 25 MG PO TABS
25.0000 mg | ORAL_TABLET | Freq: Three times a day (TID) | ORAL | Status: DC
  Administered 2017-06-27 – 2017-06-30 (×9): 25 mg via ORAL
  Filled 2017-06-27 (×10): qty 1

## 2017-06-27 NOTE — Progress Notes (Signed)
PROGRESS NOTE    Timothy [name]  POE:[id number] DOB: 1940-08-15 DOA: 06/25/2017 PCP: Mack Hook, MD   No chief complaint on file.   Brief Narrative:  HPI on 06/25/2017 by Ms. Timothy Carrel, NP Timothy [name] is a 77 y.o. male with medical history significant for hypertension, diabetes, alcohol abuse, traumatic brain injury, subdural hematoma, legally blind, chronic kidney disease gives a for surgery on his right eye that was canceled due to hypertensive emergency. Systolic blood pressure remained over 200 in spite of labetalol. Surgery was canceled [facility] are asked to admit.  Information is obtained from the patient through an interpreter as well as the chart. The last 6 weeks patient has been living at assisted living center. Prior to that he was homeless because the house he was living in was condemned. Chart review indicates 2015 he had a fall suffered a subdural hematoma. Indicates he was found down on the ground and EMS reportedly been drinking and hit his head on the fall. He did not require surgery that was discharged on anti-seizure medication. Chart review indicates he's only been to the emergency department a couple times in the last couple of years with alcohol intoxication. Interpreter at the bedside confirms patient denies headache visual disturbances syncope or near-syncope. He denies chest pain palpitation shortness of breath lower extremity edema. He denies abdominal pain nausea vomiting diarrhea constipation melena bright red blood per rectum. He thinks his last bowel movement was 2 days ago. He denies dysuria hematuria frequency or urgency. Case manager at the bedside reports he has been at assisted living for a proximal moist 6 weeks and has been getting his medications during that time.  Interim history Admitted for Accelerated hypertension and acute kidney injury.  Assessment & Plan   Hypertensive emergency  -Patient is supposedly been homeless up until 6 weeks ago,  suspect patient had noncompliance with medications -Blood pressure on admission 236/68, currently appears to have improved -Home medications listed on metoprolol and ramipril -Continue amlodipine, hydralazine oral, IV as needed -Cardiology consulted and appreciated, added Coreg 6.25 mg twice daily -hold chlorthalidone -Discussed BP control with nephrology given worsening creatinine -will change hydralazine to 25mg  TID and place holding parameters on BP meds  Acute kidney injury on chronic kidney disease, stage III -ACE inhibitor and chlorthalidone held -baseline hemoglobin approxi-1.5-1.7, currently 2.43 -Will continue to monitor BMP -renal ultrasound: Increased cortical echogenicity of bilateral kidneys, suggestive of intrinsic renal disease -Suspect acute kidney injury likely due to uncontrolled hypertension -Dicussed with nephrology, Dr. Jonnie Finner, feels that drastic improvement in BP may have lead to worsening creatinine. Recommended less "tight" control of BP -Will place patient on IVF and monitor BMP  Elevated troponin -Troponin peaked at 0.07, likely secondary to hypertension -Echocardiogram shows an EF of 31-54%, grade 1 diastolic dysfunction -As above, cardiology consulted and appreciated, recommended addition of Coreg today and outpatient Lexiscan which cardiology will arrange  Diabetes mellitus, type II -Metformin held -continue insulin sliding scale and CBG monitoring -hemoglobin A1c 7.1  Hyperkalemia -Patient given Kayexalate, calcium gluconate, D50 as well as insulin  Anemia of chronic disease -Hemoglobin appears to be stable, continue to monitor CBC  Closed traumatic brain injury -Had subdural hematoma after a fall in 2015. -Patient only oriented to self at baseline  Diabetic retinopathy -Was scheduled for surgery on the right eye on 2/7/219, however canceled due to the above -Patient will need outpatient follow-up for rescheduling  DVT Prophylaxis    heparin  Code Status: Full  Family Communication:  None at bedside  Disposition Plan: Admitted, pending improvement in renal function as well as blood pressure  Consultants Cardiology   Procedures  echocardiogram  Antibiotics   Anti-infectives (From admission, onward)   Start     Dose/Rate Route Frequency Ordered Stop   06/25/17 0745  ceFAZolin (ANCEF) 100 mg/0.5 mL subconjunctival injection 100 mg  Status:  Discontinued     100 mg Subconjunctival To Surgery 06/25/17 0733 06/25/17 0941      Subjective:   Timothy [name] seen and examined today.  Phone translator used. Patient speaks [place].  He is oriented to self only. No complaints this morning.   Objective:   Vitals:   06/26/17 1600 06/26/17 1926 06/26/17 2221 06/27/17 0420  BP: 133/66 98/73 (!) 127/49 130/66  Pulse:  76  63  Resp: 15 (!) 25  17  Temp:  98.5 F (36.9 C)  97.7 F (36.5 C)  TempSrc:  Oral  Oral  SpO2: 100% 99%  100%  Weight:    40.3 kg (88 lb 14.4 oz)  Height:        Intake/Output Summary (Last 24 hours) at 06/27/2017 1102 Last data filed at 06/26/2017 1300 Gross per 24 hour  Intake 120 ml  Output -  Net 120 ml   Filed Weights   06/25/17 1605 06/26/17 0435 06/27/17 0420  Weight: 41.9 kg (92 lb 4.8 oz) 40.5 kg (89 lb 4.8 oz) 40.3 kg (88 lb 14.4 oz)   Exam  General: Well developed, well nourished, NAD, appears stated age  55: NCAT, mucous membranes moist. Poor dentition  Cardiovascular: S1 S2 auscultated, RRR, no murmurs  Respiratory: Clear to auscultation bilaterally with equal chest rise  Abdomen: Soft, nontender, nondistended, + bowel sounds  Extremities: warm dry without cyanosis clubbing or edema  Neuro: AAOx1, nonfocal (orientation may be difficult to assess given language barrier), patient legally blind  Data Reviewed: I have personally reviewed following labs and imaging studies  CBC: Recent Labs  Lab 06/25/17 0648 06/26/17 0254 06/27/17 0158  WBC  --  13.8* 10.0  HGB  10.9* 10.5* 9.8*  HCT  --  33.0* 30.9*  MCV  --  75.0* 75.9*  PLT  --  350 174   Basic Metabolic Panel: Recent Labs  Lab 06/25/17 0648 06/25/17 0950 06/25/17 1616 06/26/17 0254 06/27/17 0158  NA 137  --  137 138 136  K 6.1*  --  5.1 4.5 4.7  CL 106  --  105 105 102  CO2 19*  --  21* 20* 23  GLUCOSE 123*  --  96 133* 111*  BUN 39*  --  35* 36* 39*  CREATININE 2.17* 2.16* 1.90* 2.07* 2.43*  CALCIUM 9.2  --  9.8 9.3 8.6*  MG  --  2.2  --   --   --    GFR: Estimated Creatinine Clearance: 14.3 mL/min (A) (by C-G formula based on SCr of 2.43 mg/dL (H)). Liver Function Tests: Recent Labs  Lab 06/26/17 0254  AST 14*  ALT 5*  ALKPHOS 74  BILITOT 0.6  PROT 6.4*  ALBUMIN 2.8*   No results for input(s): LIPASE, AMYLASE in the last 168 hours. No results for input(s): AMMONIA in the last 168 hours. Coagulation Profile: No results for input(s): INR, PROTIME in the last 168 hours. Cardiac Enzymes: Recent Labs  Lab 06/25/17 1004 06/25/17 1616 06/25/17 2208  TROPONINI 0.03* 0.03* 0.07*   BNP (last 3 results) No results for input(s): PROBNP in the last 8760 hours. HbA1C: Recent  Labs    06/25/17 0950  HGBA1C 7.1*   CBG: Recent Labs  Lab 06/26/17 0553 06/26/17 1113 06/26/17 1614 06/26/17 2110 06/27/17 0551  GLUCAP 146* 103* 115* 144* 117*   Lipid Profile: No results for input(s): CHOL, HDL, LDLCALC, TRIG, CHOLHDL, LDLDIRECT in the last 72 hours. Thyroid Function Tests: Recent Labs    06/25/17 0950  TSH 2.429   Anemia Panel: Recent Labs    06/25/17 1027  VITAMINB12 357  FOLATE 60.9  FERRITIN 38  TIBC 245*  IRON 23*  RETICCTPCT 0.9   Urine analysis:    Component Value Date/Time   COLORURINE YELLOW 11/15/2013 0231   APPEARANCEUR CLEAR 11/15/2013 0231   LABSPEC 1.011 11/15/2013 0231   PHURINE 5.5 11/15/2013 0231   GLUCOSEU >1000 (A) 11/15/2013 0231   HGBUR MODERATE (A) 11/15/2013 0231   BILIRUBINUR NEGATIVE 11/15/2013 0231   KETONESUR NEGATIVE  11/15/2013 0231   PROTEINUR 30 (A) 11/15/2013 0231   UROBILINOGEN 0.2 11/15/2013 0231   NITRITE NEGATIVE 11/15/2013 0231   LEUKOCYTESUR TRACE (A) 11/15/2013 0231   Sepsis Labs: @LABRCNTIP (procalcitonin:4,lacticidven:4)  )No results found for this or any previous visit (from the past 240 hour(s)).    Radiology Studies: US Renal  Result Date: 06/25/2017 CLINICAL DATA:  Hyperkalemia for 1 day. EXAM: RENAL / URINARY TRACT ULTRASOUND COMPLETE COMPARISON:  None. FINDINGS: Right Kidney: Length: 9.9 cm.  Increased cortical echogenicity. Left Kidney: Length: 10.5 cm.  Increased cortical echogenicity. Bladder: Appears normal for degree of bladder distention. Bilateral ureteral jets were not seen. IMPRESSION: Increased cortical echogenicity of bilateral kidneys, suggestive of intrinsic renal disease. Electronically Signed   By: Fidela Salisbury M.D.   On: 06/25/2017 22:22     Scheduled Meds: . amLODipine  10 mg Oral Daily  . aspirin  81 mg Oral Daily  . carvedilol  6.25 mg Oral BID WC  . ferrous gluconate  324 mg Oral Q breakfast  . folic acid  1 mg Oral Daily  . heparin  5,000 Units Subcutaneous Q8H  . hydrALAZINE  50 mg Oral Q8H  . insulin aspart  0-5 Units Subcutaneous QHS  . insulin aspart  0-9 Units Subcutaneous TID WC  . multivitamin with minerals  1 tablet Oral Daily  . thiamine  100 mg Oral Daily   Continuous Infusions: . sodium chloride       LOS: 1 day   Time Spent in minutes   30 minutes  Lennix Kneisel D.O. on 06/27/2017 at 11:02 AM  Between 7am to 7pm - Pager - [phone]  After 7pm go to [email or web address] - password [email or web address]  And look for the night coverage person covering for me after hours  [facility] Office  [phone]

## 2017-06-28 LAB — BASIC METABOLIC PANEL
Anion gap: 8 (ref 5–15)
BUN: 39 mg/dL — AB (ref 6–20)
CALCIUM: 8.6 mg/dL — AB (ref 8.9–10.3)
CHLORIDE: 106 mmol/L (ref 101–111)
CO2: 23 mmol/L (ref 22–32)
CREATININE: 2.26 mg/dL — AB (ref 0.61–1.24)
GFR calc non Af Amer: 26 mL/min — ABNORMAL LOW (ref >60)
GFR, EST AFRICAN AMERICAN: 30 mL/min — AB (ref >60)
Glucose, Bld: 117 mg/dL — ABNORMAL HIGH (ref 65–99)
Potassium: 4.8 mmol/L (ref 3.5–5.1)
Sodium: 137 mmol/L (ref 135–145)

## 2017-06-28 LAB — GLUCOSE, CAPILLARY
GLUCOSE-CAPILLARY: 158 mg/dL — AB (ref 65–99)
Glucose-Capillary: 111 mg/dL — ABNORMAL HIGH (ref 65–99)
Glucose-Capillary: 183 mg/dL — ABNORMAL HIGH (ref 65–99)
Glucose-Capillary: 127 mg/dL — ABNORMAL HIGH (ref 65–99)

## 2017-06-28 NOTE — Op Note (Signed)
PATIENT:  Timothy [name]  77 y.o. male  PRE-OPERATIVE DIAGNOSIS:  RETINAL DETACHMENT LEFT EYE  POST-OPERATIVE DIAGNOSIS:  * No post-op diagnosis entered *  PROCEDURE:  Procedure(s): CASE CANCELLED - VITRECTOMY WITH 25 GUAGE MEMBRANE PILL WITH GAS, AIR SILICONE OIL AND PHOTO COAGULATION LEFT EYE (Left)  SURGEON:  Surgeon(s) and Role:    * Jalene Mullet, MD - Primary  PHYSICIAN ASSISTANT:   ASSISTANTS: none   ANESTHESIA:   none  EBL:  NA   BLOOD ADMINISTERED:none  DRAINS: none   LOCAL MEDICATIONS USED:  NONE  SPECIMEN:  No Specimen  DISPOSITION OF SPECIMEN:  N/A  COUNT:  NA  TOURNIQUET:  * No tourniquets in log *  DICTATION: .Note written in paper chart  PLAN OF CARE: Admit to inpatient   PATIENT DISPOSITION:  PACU - sent to floor for BP control   Delay start of Pharmacological VTE agent (>24hrs) due to surgical blood loss or risk of bleeding: not applicable

## 2017-06-28 NOTE — Brief Op Note (Signed)
06/25/2017  5:06 PM  PATIENT:  Timothy [name]  77 y.o. male  PRE-OPERATIVE DIAGNOSIS:  RETINAL DETACHMENT LEFT EYE  POST-OPERATIVE DIAGNOSIS:  * No post-op diagnosis entered *  PROCEDURE:  Procedure(s): CASE CANCELLED - VITRECTOMY WITH 25 GUAGE MEMBRANE PILL WITH GAS, AIR SILICONE OIL AND PHOTO COAGULATION LEFT EYE (Left)  SURGEON:  Surgeon(s) and Role:    * Jalene Mullet, MD - Primary  PHYSICIAN ASSISTANT:   ASSISTANTS: none   ANESTHESIA:   none  EBL:  NA   BLOOD ADMINISTERED:none  DRAINS: none   LOCAL MEDICATIONS USED:  NONE  SPECIMEN:  No Specimen  DISPOSITION OF SPECIMEN:  N/A  COUNT:  NA  TOURNIQUET:  * No tourniquets in log *  DICTATION: .Note written in paper chart  PLAN OF CARE: Admit to inpatient   PATIENT DISPOSITION:  PACU - sent to floor for BP control   Delay start of Pharmacological VTE agent (>24hrs) due to surgical blood loss or risk of bleeding: not applicable

## 2017-06-28 NOTE — Progress Notes (Signed)
PROGRESS NOTE    Timothy [name]  DPO:[id number] DOB: [birth year] DOA: 06/25/2017 PCP: Mack Hook, MD   No chief complaint on file.   Brief Narrative:  HPI on 06/25/2017 by [name], NP Timothy [name] is a 77 y.o. male with medical history significant for hypertension, diabetes, alcohol abuse, traumatic brain injury, subdural hematoma, legally blind, chronic kidney disease gives a for surgery on his right eye that was canceled due to hypertensive emergency. Systolic blood pressure remained over 200 in spite of labetalol. Surgery was canceled [facility] are asked to admit.  Information is obtained from the patient through an interpreter as well as the chart. The last 6 weeks patient has been living at assisted living center. Prior to that he was homeless because the house he was living in was condemned. Chart review indicates 2015 he had a fall suffered a subdural hematoma. Indicates he was found down on the ground and EMS reportedly been drinking and hit his head on the fall. He did not require surgery that was discharged on anti-seizure medication. Chart review indicates he's only been to the emergency department a couple times in the last couple of years with alcohol intoxication. Interpreter at the bedside confirms patient denies headache visual disturbances syncope or near-syncope. He denies chest pain palpitation shortness of breath lower extremity edema. He denies abdominal pain nausea vomiting diarrhea constipation melena bright red blood per rectum. He thinks his last bowel movement was 2 days ago. He denies dysuria hematuria frequency or urgency. Case manager at the bedside reports he has been at assisted living for a proximal moist 6 weeks and has been getting his medications during that time.  Interim history Admitted for Accelerated hypertension and acute kidney injury.  Assessment & Plan   Hypertensive emergency  -Patient is supposedly been homeless up until 6 weeks ago,  suspect patient had noncompliance with medications -Blood pressure on admission 236/68, currently appears to have improved -Home medications listed on metoprolol and ramipril -Continue amlodipine, hydralazine oral (decreased), IV as needed- with holding parameters -Cardiology consulted and appreciated, added Coreg 6.25 mg twice daily -hold chlorthalidone -Discussed BP control with nephrology given worsening creatinine  Acute kidney injury on chronic kidney disease, stage III -ACE inhibitor and chlorthalidone held -baseline hemoglobin approxi-1.5-1.7, currently 2.26 -Will continue to monitor BMP -renal ultrasound: Increased cortical echogenicity of bilateral kidneys, suggestive of intrinsic renal disease -Suspect acute kidney injury likely due to uncontrolled hypertension -Dicussed with nephrology, Dr. Jonnie Finner, feels that drastic improvement in BP may have lead to worsening creatinine. Recommended less "tight" control of BP -Continue gentle IVF and monitor BMP  Elevated troponin -Troponin peaked at 0.07, likely secondary to hypertension -Echocardiogram shows an EF of 43-15%, grade 1 diastolic dysfunction -As above, cardiology consulted and appreciated, recommended addition of Coreg today and outpatient Lexiscan which cardiology will arrange  Diabetes mellitus, type II -Metformin held -continue insulin sliding scale and CBG monitoring -hemoglobin A1c 7.1  Hyperkalemia -Patient given Kayexalate, calcium gluconate, D50 as well as insulin  Anemia of chronic disease -Hemoglobin appears to be stable, continue to monitor CBC  Closed traumatic brain injury -Had subdural hematoma after a fall in 2015. -Patient only oriented to self at baseline  Diabetic retinopathy -Was scheduled for surgery on the right eye on 2/7/219, however canceled due to the above -Patient will need outpatient follow-up for rescheduling  DVT Prophylaxis   heparin  Code Status: Full  Family Communication: None  at bedside  Disposition Plan: Admitted, pending improvement in renal  function   Consultants Cardiology   Procedures  echocardiogram  Antibiotics   Anti-infectives (From admission, onward)   Start     Dose/Rate Route Frequency Ordered Stop   06/25/17 0745  ceFAZolin (ANCEF) 100 mg/0.5 mL subconjunctival injection 100 mg  Status:  Discontinued     100 mg Subconjunctival To Surgery 06/25/17 0733 06/25/17 0941      Subjective:   Timothy [name] seen and examined today.  Phone translator used. Patient speaks [place].  States he is feeling cold. Denies other complaints or pain.  Objective:   Vitals:   06/27/17 1401 06/27/17 2016 06/28/17 0338 06/28/17 0933  BP: (!) 134/51 (!) 160/62 (!) 146/69   Pulse: 64 66 65 70  Resp:  17 20   Temp: 97.8 F (36.6 C) 98 F (36.7 C) 98.2 F (36.8 C) 98.6 F (37 C)  TempSrc: Oral Oral Oral Oral  SpO2: 100% 99% 99%   Weight:   40.2 kg (88 lb 10 oz)   Height:        Intake/Output Summary (Last 24 hours) at 06/28/2017 1116 Last data filed at 06/28/2017 1000 Gross per 24 hour  Intake 2296.25 ml  Output 670 ml  Net 1626.25 ml   Filed Weights   06/26/17 0435 06/27/17 0420 06/28/17 0338  Weight: 40.5 kg (89 lb 4.8 oz) 40.3 kg (88 lb 14.4 oz) 40.2 kg (88 lb 10 oz)   Exam  General: Well developed, well nourished, NAD, appears stated age  HEENT: NCAT, mucous membranes moist.   Cardiovascular: S1 S2 auscultated, no rubs, murmurs or gallops. Regular rate and rhythm.  Respiratory: Clear to auscultation bilaterally with equal chest rise  Abdomen: Soft, nontender, nondistended, + bowel sounds  Extremities: warm dry without cyanosis clubbing or edema  Neuro: AAOx1, nonfocal (orientation difficult to assess given language barrier), legally blind  Psych: Appropriate  Data Reviewed: I have personally reviewed following labs and imaging studies  CBC: Recent Labs  Lab 06/25/17 0648 06/26/17 0254 06/27/17 0158  WBC  --  13.8* 10.0  HGB  10.9* 10.5* 9.8*  HCT  --  33.0* 30.9*  MCV  --  75.0* 75.9*  PLT  --  350 734   Basic Metabolic Panel: Recent Labs  Lab 06/25/17 0648 06/25/17 0950 06/25/17 1616 06/26/17 0254 06/27/17 0158 06/28/17 0329  NA 137  --  137 138 136 137  K 6.1*  --  5.1 4.5 4.7 4.8  CL 106  --  105 105 102 106  CO2 19*  --  21* 20* 23 23  GLUCOSE 123*  --  96 133* 111* 117*  BUN 39*  --  35* 36* 39* 39*  CREATININE 2.17* 2.16* 1.90* 2.07* 2.43* 2.26*  CALCIUM 9.2  --  9.8 9.3 8.6* 8.6*  MG  --  2.2  --   --   --   --    GFR: Estimated Creatinine Clearance: 15.3 mL/min (A) (by C-G formula based on SCr of 2.26 mg/dL (H)). Liver Function Tests: Recent Labs  Lab 06/26/17 0254  AST 14*  ALT 5*  ALKPHOS 74  BILITOT 0.6  PROT 6.4*  ALBUMIN 2.8*   No results for input(s): LIPASE, AMYLASE in the last 168 hours. No results for input(s): AMMONIA in the last 168 hours. Coagulation Profile: No results for input(s): INR, PROTIME in the last 168 hours. Cardiac Enzymes: Recent Labs  Lab 06/25/17 1004 06/25/17 1616 06/25/17 2208  TROPONINI 0.03* 0.03* 0.07*   BNP (last 3 results) No results  for input(s): PROBNP in the last 8760 hours. HbA1C: No results for input(s): HGBA1C in the last 72 hours. CBG: Recent Labs  Lab 06/26/17 2110 06/27/17 0551 06/27/17 1214 06/27/17 1625 06/28/17 0606  GLUCAP 144* 117* 109* 118* 111*   Lipid Profile: No results for input(s): CHOL, HDL, LDLCALC, TRIG, CHOLHDL, LDLDIRECT in the last 72 hours. Thyroid Function Tests: No results for input(s): TSH, T4TOTAL, FREET4, T3FREE, THYROIDAB in the last 72 hours. Anemia Panel: No results for input(s): VITAMINB12, FOLATE, FERRITIN, TIBC, IRON, RETICCTPCT in the last 72 hours. Urine analysis:    Component Value Date/Time   COLORURINE YELLOW 11/15/2013 0231   APPEARANCEUR CLEAR 11/15/2013 0231   LABSPEC 1.011 11/15/2013 0231   PHURINE 5.5 11/15/2013 0231   GLUCOSEU >1000 (A) 11/15/2013 0231   HGBUR MODERATE  (A) 11/15/2013 0231   BILIRUBINUR NEGATIVE 11/15/2013 0231   KETONESUR NEGATIVE 11/15/2013 0231   PROTEINUR 30 (A) 11/15/2013 0231   UROBILINOGEN 0.2 11/15/2013 0231   NITRITE NEGATIVE 11/15/2013 0231   LEUKOCYTESUR TRACE (A) 11/15/2013 0231   Sepsis Labs: @LABRCNTIP (procalcitonin:4,lacticidven:4)  )No results found for this or any previous visit (from the past 240 hour(s)).    Radiology Studies: No results found.   Scheduled Meds: . amLODipine  10 mg Oral Daily  . aspirin  81 mg Oral Daily  . carvedilol  6.25 mg Oral BID WC  . ferrous gluconate  324 mg Oral Q breakfast  . folic acid  1 mg Oral Daily  . heparin  5,000 Units Subcutaneous Q8H  . hydrALAZINE  25 mg Oral Q8H  . insulin aspart  0-5 Units Subcutaneous QHS  . insulin aspart  0-9 Units Subcutaneous TID WC  . multivitamin with minerals  1 tablet Oral Daily  . thiamine  100 mg Oral Daily   Continuous Infusions: . sodium chloride 75 mL/hr at 06/28/17 0400     LOS: 2 days   Time Spent in minutes   30 minutes  Kimber Fritts D.O. on 06/28/2017 at 11:16 AM  Between 7am to 7pm - Pager - [phone]  After 7pm go to [email or web address] - password [email or web address]  And look for the night coverage person covering for me after hours  [facility] Office  [phone]

## 2017-06-29 ENCOUNTER — Inpatient Hospital Stay (HOSPITAL_COMMUNITY): Payer: Medicare Other | Admitting: Certified Registered Nurse Anesthetist

## 2017-06-29 ENCOUNTER — Encounter (HOSPITAL_COMMUNITY)
Admission: AD | Disposition: A | Discharge status: Home / Self Care | Payer: Self-pay | Source: Ambulatory Visit | Attending: Internal Medicine

## 2017-06-29 ENCOUNTER — Ambulatory Visit: Payer: Self-pay | Admitting: Ophthalmology

## 2017-06-29 HISTORY — PX: PARS PLANA VITRECTOMY: SHX2166

## 2017-06-29 LAB — BASIC METABOLIC PANEL
ANION GAP: 8 (ref 5–15)
BUN: 32 mg/dL — ABNORMAL HIGH (ref 6–20)
CALCIUM: 8.5 mg/dL — AB (ref 8.9–10.3)
CO2: 20 mmol/L — AB (ref 22–32)
CREATININE: 1.89 mg/dL — AB (ref 0.61–1.24)
Chloride: 110 mmol/L (ref 101–111)
GFR, EST AFRICAN AMERICAN: 38 mL/min — AB (ref >60)
GFR, EST NON AFRICAN AMERICAN: 32 mL/min — AB (ref >60)
GLUCOSE: 112 mg/dL — AB (ref 65–99)
Potassium: 5 mmol/L (ref 3.5–5.1)
Sodium: 138 mmol/L (ref 135–145)

## 2017-06-29 LAB — GLUCOSE, CAPILLARY
GLUCOSE-CAPILLARY: 141 mg/dL — AB (ref 65–99)
GLUCOSE-CAPILLARY: 93 mg/dL (ref 65–99)
Glucose-Capillary: 106 mg/dL — ABNORMAL HIGH (ref 65–99)
Glucose-Capillary: 115 mg/dL — ABNORMAL HIGH (ref 65–99)
Glucose-Capillary: 101 mg/dL — ABNORMAL HIGH (ref 65–99)
Glucose-Capillary: 118 mg/dL — ABNORMAL HIGH (ref 65–99)

## 2017-06-29 SURGERY — PARS PLANA VITRECTOMY WITH 25 GAUGE
Anesthesia: General | Laterality: Left

## 2017-06-29 MED ORDER — ROCURONIUM BROMIDE 100 MG/10ML IV SOLN
INTRAVENOUS | Status: DC | PRN
  Administered 2017-06-29 (×2): 20 mg via INTRAVENOUS

## 2017-06-29 MED ORDER — DEXAMETHASONE SODIUM PHOSPHATE 10 MG/ML IJ SOLN
INTRAMUSCULAR | Status: DC | PRN
  Administered 2017-06-29: 5 mg via INTRAVENOUS

## 2017-06-29 MED ORDER — PROPOFOL 10 MG/ML IV BOLUS
INTRAVENOUS | Status: DC | PRN
  Administered 2017-06-29: 80 mg via INTRAVENOUS

## 2017-06-29 MED ORDER — EPINEPHRINE PF 1 MG/ML IJ SOLN
INTRAMUSCULAR | Status: AC
  Filled 2017-06-29: qty 1

## 2017-06-29 MED ORDER — DEXAMETHASONE SODIUM PHOSPHATE 10 MG/ML IJ SOLN
INTRAMUSCULAR | Status: AC
  Filled 2017-06-29: qty 1

## 2017-06-29 MED ORDER — ONDANSETRON HCL 4 MG/2ML IJ SOLN
INTRAMUSCULAR | Status: AC
  Filled 2017-06-29: qty 2

## 2017-06-29 MED ORDER — SUGAMMADEX SODIUM 200 MG/2ML IV SOLN
INTRAVENOUS | Status: DC | PRN
  Administered 2017-06-29: 200 mg via INTRAVENOUS

## 2017-06-29 MED ORDER — TRIAMCINOLONE ACETONIDE 40 MG/ML IJ SUSP
INTRAMUSCULAR | Status: DC | PRN
  Administered 2017-06-29: 40 mg via INTRAMUSCULAR

## 2017-06-29 MED ORDER — ROCURONIUM BROMIDE 10 MG/ML (PF) SYRINGE
PREFILLED_SYRINGE | INTRAVENOUS | Status: AC
  Filled 2017-06-29: qty 5

## 2017-06-29 MED ORDER — BSS PLUS IO SOLN
INTRAOCULAR | Status: AC
  Filled 2017-06-29: qty 500

## 2017-06-29 MED ORDER — DEXAMETHASONE SODIUM PHOSPHATE 10 MG/ML IJ SOLN
INTRAMUSCULAR | Status: DC | PRN
  Administered 2017-06-29: .5 mL via INTRAVENOUS

## 2017-06-29 MED ORDER — HYPROMELLOSE (GONIOSCOPIC) 2.5 % OP SOLN
OPHTHALMIC | Status: DC | PRN
  Administered 2017-06-29: 10 [drp] via OPHTHALMIC

## 2017-06-29 MED ORDER — OFLOXACIN 0.3 % OP SOLN
1.0000 [drp] | OPHTHALMIC | Status: DC | PRN
  Filled 2017-06-29 (×2): qty 5

## 2017-06-29 MED ORDER — EPHEDRINE SULFATE 50 MG/ML IJ SOLN
INTRAMUSCULAR | Status: DC | PRN
Start: 2017-06-29 — End: 2017-06-29
  Administered 2017-06-29 (×3): 5 mg via INTRAVENOUS

## 2017-06-29 MED ORDER — CYCLOPENTOLATE HCL 1 % OP SOLN
1.0000 [drp] | OPHTHALMIC | Status: AC | PRN
  Administered 2017-06-29 (×3): 1 [drp] via OPHTHALMIC
  Filled 2017-06-29 (×2): qty 2

## 2017-06-29 MED ORDER — 0.9 % SODIUM CHLORIDE (POUR BTL) OPTIME
TOPICAL | Status: DC | PRN
  Administered 2017-06-29: 1000 mL

## 2017-06-29 MED ORDER — TOBRAMYCIN-DEXAMETHASONE 0.3-0.1 % OP SUSP
OPHTHALMIC | Status: DC | PRN
  Administered 2017-06-29: 5 [drp] via OPHTHALMIC

## 2017-06-29 MED ORDER — FENTANYL CITRATE (PF) 100 MCG/2ML IJ SOLN
INTRAMUSCULAR | Status: DC | PRN
  Administered 2017-06-29 (×2): 50 ug via INTRAVENOUS

## 2017-06-29 MED ORDER — BSS IO SOLN
INTRAOCULAR | Status: DC | PRN
  Administered 2017-06-29: 15 mL via INTRAOCULAR

## 2017-06-29 MED ORDER — PHENYLEPHRINE HCL 2.5 % OP SOLN
1.0000 [drp] | OPHTHALMIC | Status: AC | PRN
  Administered 2017-06-29 (×3): 1 [drp] via OPHTHALMIC
  Filled 2017-06-29 (×2): qty 2

## 2017-06-29 MED ORDER — LIDOCAINE HCL (CARDIAC) 20 MG/ML IV SOLN
INTRAVENOUS | Status: DC | PRN
  Administered 2017-06-29: 40 mg via INTRAVENOUS

## 2017-06-29 MED ORDER — TROPICAMIDE 1 % OP SOLN
1.0000 [drp] | OPHTHALMIC | Status: AC | PRN
  Administered 2017-06-29 (×3): 1 [drp] via OPHTHALMIC
  Filled 2017-06-29 (×2): qty 2

## 2017-06-29 MED ORDER — LIDOCAINE 2% (20 MG/ML) 5 ML SYRINGE
INTRAMUSCULAR | Status: AC
  Filled 2017-06-29: qty 5

## 2017-06-29 MED ORDER — INDOCYANINE GREEN 25 MG IV SOLR
INTRAVENOUS | Status: AC
  Filled 2017-06-29: qty 25

## 2017-06-29 MED ORDER — TETRACAINE HCL 0.5 % OP SOLN
OPHTHALMIC | Status: AC
  Filled 2017-06-29: qty 4

## 2017-06-29 MED ORDER — BSS IO SOLN
INTRAOCULAR | Status: AC
  Filled 2017-06-29: qty 30

## 2017-06-29 MED ORDER — BSS PLUS IO SOLN: INTRAOCULAR | Status: DC | PRN

## 2017-06-29 MED ORDER — ONDANSETRON HCL 4 MG/2ML IJ SOLN
INTRAMUSCULAR | Status: DC | PRN
  Administered 2017-06-29: 4 mg via INTRAVENOUS

## 2017-06-29 MED ORDER — MIDAZOLAM HCL 2 MG/2ML IJ SOLN
INTRAMUSCULAR | Status: AC
  Filled 2017-06-29: qty 2

## 2017-06-29 MED ORDER — TOBRAMYCIN-DEXAMETHASONE 0.3-0.1 % OP OINT
TOPICAL_OINTMENT | OPHTHALMIC | Status: AC
  Filled 2017-06-29: qty 3.5

## 2017-06-29 MED ORDER — PROPOFOL 10 MG/ML IV BOLUS
INTRAVENOUS | Status: AC
  Filled 2017-06-29: qty 20

## 2017-06-29 MED ORDER — FENTANYL CITRATE (PF) 250 MCG/5ML IJ SOLN
INTRAMUSCULAR | Status: AC
  Filled 2017-06-29: qty 5

## 2017-06-29 MED ORDER — SUGAMMADEX SODIUM 200 MG/2ML IV SOLN
INTRAVENOUS | Status: AC
  Filled 2017-06-29: qty 2

## 2017-06-29 MED ORDER — BUPIVACAINE HCL (PF) 0.75 % IJ SOLN
INTRAMUSCULAR | Status: AC
  Filled 2017-06-29: qty 10

## 2017-06-29 MED ORDER — HYALURONIDASE HUMAN 150 UNIT/ML IJ SOLN
INTRAMUSCULAR | Status: AC
  Filled 2017-06-29: qty 1

## 2017-06-29 MED ORDER — LIDOCAINE HCL 2 % IJ SOLN
INTRAMUSCULAR | Status: AC
  Filled 2017-06-29: qty 20

## 2017-06-29 MED ORDER — PHENYLEPHRINE 40 MCG/ML (10ML) SYRINGE FOR IV PUSH (FOR BLOOD PRESSURE SUPPORT)
PREFILLED_SYRINGE | INTRAVENOUS | Status: AC
  Filled 2017-06-29: qty 10

## 2017-06-29 MED ORDER — EPHEDRINE 5 MG/ML INJ
INTRAVENOUS | Status: AC
  Filled 2017-06-29: qty 10

## 2017-06-29 MED ORDER — CEFAZOLIN 200 MG/ML FOR DIALYSIS
INJECTION | INTRAOCULAR | Status: DC | PRN
  Administered 2017-06-29: .5 mL

## 2017-06-29 MED ORDER — ATROPINE SULFATE 1 % OP SOLN
OPHTHALMIC | Status: AC
  Filled 2017-06-29: qty 5

## 2017-06-29 MED ORDER — STERILE WATER FOR IRRIGATION IR SOLN
Status: DC | PRN
  Administered 2017-06-29: 1000 mL

## 2017-06-29 MED ORDER — OFLOXACIN 0.3 % OP SOLN
1.0000 [drp] | OPHTHALMIC | Status: AC | PRN
  Administered 2017-06-29 (×3): 1 [drp] via OPHTHALMIC

## 2017-06-29 MED ORDER — CEFAZOLIN SUBCONJUNCTIVAL INJECTION 100 MG/0.5 ML
200.0000 mg | INJECTION | SUBCONJUNCTIVAL | Status: AC
  Filled 2017-06-29: qty 5

## 2017-06-29 MED ORDER — FENTANYL CITRATE (PF) 100 MCG/2ML IJ SOLN: 25.0000 ug | INTRAMUSCULAR | Status: DC | PRN

## 2017-06-29 MED ORDER — SUCCINYLCHOLINE CHLORIDE 200 MG/10ML IV SOSY
PREFILLED_SYRINGE | INTRAVENOUS | Status: AC
  Filled 2017-06-29: qty 10

## 2017-06-29 MED ORDER — HYPROMELLOSE (GONIOSCOPIC) 2.5 % OP SOLN
OPHTHALMIC | Status: AC
  Filled 2017-06-29: qty 15

## 2017-06-29 MED ORDER — TRIAMCINOLONE ACETONIDE 40 MG/ML IJ SUSP
INTRAMUSCULAR | Status: AC
  Filled 2017-06-29: qty 5

## 2017-06-29 MED ORDER — BSS PLUS IO SOLN
INTRAOCULAR | Status: DC | PRN
  Administered 2017-06-29: 18:00:00

## 2017-06-29 SURGICAL SUPPLY — 53 items
APPLICATOR COTTON TIP 6IN STRL (MISCELLANEOUS) ×3 IMPLANT
BLADE MVR KNIFE 20G (BLADE) IMPLANT
CANNULA ANT CHAM MAIN (OPHTHALMIC RELATED) IMPLANT
CANNULA DUAL BORE 23G (CANNULA) IMPLANT
CANNULA DUALBORE 25G (CANNULA) IMPLANT
CANNULA VLV SOFT TIP 25GA (OPHTHALMIC) ×3 IMPLANT
CAUTERY EYE LOW TEMP 1300F FIN (OPHTHALMIC RELATED) IMPLANT
CLOSURE STERI-STRIP 1/2X4 (GAUZE/BANDAGES/DRESSINGS) ×1
CLSR STERI-STRIP ANTIMIC 1/2X4 (GAUZE/BANDAGES/DRESSINGS) ×2 IMPLANT
COVER MAYO STAND STRL (DRAPES) IMPLANT
DRAPE HALF SHEET 40X57 (DRAPES) ×3 IMPLANT
DRAPE INCISE 51X51 W/FILM STRL (DRAPES) IMPLANT
DRAPE RETRACTOR (MISCELLANEOUS) ×3 IMPLANT
ERASER HMR WETFIELD 23G BP (MISCELLANEOUS) IMPLANT
FORCEPS ECKARDT ILM 25G SERR (OPHTHALMIC RELATED) IMPLANT
FORCEPS GRIESHABER ILM 25G A (INSTRUMENTS) ×3 IMPLANT
GAS AUTO FILL CONSTEL (OPHTHALMIC)
GAS AUTO FILL CONSTELLATION (OPHTHALMIC) IMPLANT
GLOVE ECLIPSE 7.5 STRL STRAW (GLOVE) ×3 IMPLANT
GOWN STRL REUS W/ TWL LRG LVL3 (GOWN DISPOSABLE) ×1 IMPLANT
GOWN STRL REUS W/TWL LRG LVL3 (GOWN DISPOSABLE) ×2
KIT BASIN OR (CUSTOM PROCEDURE TRAY) ×3 IMPLANT
KIT TURNOVER KIT B (KITS) ×3 IMPLANT
LENS BIOM SUPER VIEW SET DISP (OPHTHALMIC RELATED) ×3 IMPLANT
MICROPICK 25G (MISCELLANEOUS)
NEEDLE 18GX1X1/2 (RX/OR ONLY) (NEEDLE) ×3 IMPLANT
NEEDLE 25GX 5/8IN NON SAFETY (NEEDLE) ×3 IMPLANT
NEEDLE FILTER BLUNT 18X 1/2SAF (NEEDLE)
NEEDLE FILTER BLUNT 18X1 1/2 (NEEDLE) IMPLANT
NEEDLE HYPO 25GX1X1/2 BEV (NEEDLE) IMPLANT
NEEDLE HYPO 30X.5 LL (NEEDLE) ×6 IMPLANT
NEEDLE RETROBULBAR 25GX1.5 (NEEDLE) ×3 IMPLANT
NS IRRIG 1000ML POUR BTL (IV SOLUTION) ×3 IMPLANT
OIL SILICONE OPHTHALMIC 1000 (Ophthalmic Related) ×3 IMPLANT
PACK VITRECTOMY CUSTOM (CUSTOM PROCEDURE TRAY) ×3 IMPLANT
PAD ARMBOARD 7.5X6 YLW CONV (MISCELLANEOUS) IMPLANT
PAK PIK VITRECTOMY CVS 25GA (OPHTHALMIC) ×3 IMPLANT
PENCIL BIPOLAR 25GA STR DISP (OPHTHALMIC RELATED) IMPLANT
PICK MICROPICK 25G (MISCELLANEOUS) IMPLANT
PROBE LASER ILLUM FLEX CVD 25G (OPHTHALMIC) ×3 IMPLANT
ROLLS DENTAL (MISCELLANEOUS) IMPLANT
SCRAPER DIAMOND 25GA (OPHTHALMIC RELATED) IMPLANT
SET INJECTOR OIL FLUID CONSTEL (OPHTHALMIC) ×3 IMPLANT
SOLUTION ANTI FOG 6CC (MISCELLANEOUS) ×3 IMPLANT
STOPCOCK 4 WAY LG BORE MALE ST (IV SETS) IMPLANT
SUT VICRYL 7 0 TG140 8 (SUTURE) ×3 IMPLANT
SUT VICRYL 8 0 TG140 8 (SUTURE) IMPLANT
SYR 10ML LL (SYRINGE) IMPLANT
SYR 20CC LL (SYRINGE) ×3 IMPLANT
SYR 5ML LL (SYRINGE) ×3 IMPLANT
SYR TB 1ML LUER SLIP (SYRINGE) ×3 IMPLANT
WATER STERILE IRR 1000ML POUR (IV SOLUTION) ×3 IMPLANT
WIPE INSTRUMENT VISIWIPE 73X73 (MISCELLANEOUS) IMPLANT

## 2017-06-29 NOTE — Progress Notes (Signed)
Eliezer Lofts, CRNA made aware of BP

## 2017-06-29 NOTE — Progress Notes (Signed)
PROGRESS NOTE    Timothy [name]  NUU:[id number] DOB: 1940/10/27 DOA: 06/25/2017 PCP: No primary care provider on file.   No chief complaint on file.   Brief Narrative:  HPI on 06/25/2017 by [name], NP Timothy [name] is a 77 y.o. male with medical history significant for hypertension, diabetes, alcohol abuse, traumatic brain injury, subdural hematoma, legally blind, chronic kidney disease gives a for surgery on his right eye that was canceled due to hypertensive emergency. Systolic blood pressure remained over 200 in spite of labetalol. Surgery was canceled [facility] are asked to admit.  Information is obtained from the patient through an interpreter as well as the chart. The last 6 weeks patient has been living at assisted living center. Prior to that he was homeless because the house he was living in was condemned. Chart review indicates 2015 he had a fall suffered a subdural hematoma. Indicates he was found down on the ground and EMS reportedly been drinking and hit his head on the fall. He did not require surgery that was discharged on anti-seizure medication. Chart review indicates he's only been to the emergency department a couple times in the last couple of years with alcohol intoxication. Interpreter at the bedside confirms patient denies headache visual disturbances syncope or near-syncope. He denies chest pain palpitation shortness of breath lower extremity edema. He denies abdominal pain nausea vomiting diarrhea constipation melena bright red blood per rectum. He thinks his last bowel movement was 2 days ago. He denies dysuria hematuria frequency or urgency. Case manager at the bedside reports he has been at assisted living for a proximal moist 6 weeks and has been getting his medications during that time.  Interim history Admitted for Accelerated hypertension and acute kidney injury.  Assessment & Plan   Hypertensive emergency  -Patient is supposedly been homeless up until 6  weeks ago, suspect patient had noncompliance with medications -Blood pressure on admission 236/68, currently appears to have improved -Home medications listed on metoprolol and ramipril -Continue amlodipine, hydralazine oral (decreased), IV as needed- with holding parameters -Cardiology consulted and appreciated, added Coreg 6.25 mg twice daily -hold chlorthalidone -Discussed BP control with nephrology given worsening creatinine  Acute kidney injury on chronic kidney disease, stage III -ACE inhibitor and chlorthalidone held -baseline hemoglobin approximately 1.5-1.7 -renal ultrasound: Increased cortical echogenicity of bilateral kidneys, suggestive of intrinsic renal disease -Suspect acute kidney injury likely due to uncontrolled hypertension -Dicussed with nephrology, Dr. Jonnie Finner, feels that drastic improvement in BP may have lead to worsening creatinine. Recommended less "tight" control of BP -Creatinine appears to be improving, currently 1.89 -Continue gentle IVF and monitor BMP  Elevated troponin -Troponin peaked at 0.07, likely secondary to hypertension -Echocardiogram shows an EF of 34-74%, grade 1 diastolic dysfunction -As above, cardiology consulted and appreciated, recommended addition of Coreg today and outpatient Lexiscan which cardiology will arrange  Diabetes mellitus, type II -Metformin held -continue insulin sliding scale and CBG monitoring -hemoglobin A1c 7.1  Hyperkalemia -Patient given Kayexalate, calcium gluconate, D50 as well as insulin -Resolved, potassium currently 5, continue to monitor BMP  Anemia of chronic disease -Hemoglobin appears to be stable, continue to monitor CBC  Closed traumatic brain injury -Had subdural hematoma after a fall in 2015. -Patient only oriented to self at baseline  Diabetic retinopathy -Was scheduled for surgery on the right eye on 2/7/219, however canceled due to the above -Discussed with Dr. Posey Pronto, ophthalmologist, will  attempt surgery this evening  DVT Prophylaxis   heparin  Code Status:  Full  Family Communication: None at bedside  Disposition Plan: Admitted, pending improvement in renal function   Consultants Cardiology Ophthalmology  Procedures  echocardiogram  Antibiotics   Anti-infectives (From admission, onward)   Start     Dose/Rate Route Frequency Ordered Stop   06/25/17 0745  ceFAZolin (ANCEF) 100 mg/0.5 mL subconjunctival injection 100 mg  Status:  Discontinued     100 mg Subconjunctival To Surgery 06/25/17 0733 06/25/17 0941      Subjective:   Timothy [name] seen and examined today.  Phone translator used. Patient speaks [place].  Feels cold.   Objective:   Vitals:   06/28/17 1417 06/28/17 2000 06/29/17 0410 06/29/17 1012  BP: (!) 146/72 (!) 151/60 (!) 170/62 (!) 192/71  Pulse: 68 72 90   Resp: 18 18 19    Temp: 98.2 F (36.8 C) 98.6 F (37 C) 98 F (36.7 C)   TempSrc: Oral Oral Oral   SpO2: 100% 99% 100%   Weight:   41.7 kg (92 lb)   Height:        Intake/Output Summary (Last 24 hours) at 06/29/2017 1112 Last data filed at 06/29/2017 0867 Gross per 24 hour  Intake 2790 ml  Output 850 ml  Net 1940 ml   Filed Weights   06/27/17 0420 06/28/17 0338 06/29/17 0410  Weight: 40.3 kg (88 lb 14.4 oz) 40.2 kg (88 lb 10 oz) 41.7 kg (92 lb)   Exam  General: Well developed, well nourished, NAD, appears stated age  HEENT: NCAT, mucous membranes moist.   Cardiovascular: S1 S2 auscultated, RRR, no mumur  Respiratory: Clear to auscultation bilaterally with equal chest rise  Abdomen: Soft, nontender, nondistended, + bowel sounds  Extremities: warm dry without cyanosis clubbing or edema  Neuro: AAOx1, legally blind, nonfocal  Psych: appropriate  Data Reviewed: I have personally reviewed following labs and imaging studies  CBC: Recent Labs  Lab 06/25/17 0648 06/26/17 0254 06/27/17 0158  WBC  --  13.8* 10.0  HGB 10.9* 10.5* 9.8*  HCT  --  33.0* 30.9*  MCV  --   75.0* 75.9*  PLT  --  350 619   Basic Metabolic Panel: Recent Labs  Lab 06/25/17 0950 06/25/17 1616 06/26/17 0254 06/27/17 0158 06/28/17 0329 06/29/17 0225  NA  --  137 138 136 137 138  K  --  5.1 4.5 4.7 4.8 5.0  CL  --  105 105 102 106 110  CO2  --  21* 20* 23 23 20*  GLUCOSE  --  96 133* 111* 117* 112*  BUN  --  35* 36* 39* 39* 32*  CREATININE 2.16* 1.90* 2.07* 2.43* 2.26* 1.89*  CALCIUM  --  9.8 9.3 8.6* 8.6* 8.5*  MG 2.2  --   --   --   --   --    GFR: Estimated Creatinine Clearance: 19 mL/min (A) (by C-G formula based on SCr of 1.89 mg/dL (H)). Liver Function Tests: Recent Labs  Lab 06/26/17 0254  AST 14*  ALT 5*  ALKPHOS 74  BILITOT 0.6  PROT 6.4*  ALBUMIN 2.8*   No results for input(s): LIPASE, AMYLASE in the last 168 hours. No results for input(s): AMMONIA in the last 168 hours. Coagulation Profile: No results for input(s): INR, PROTIME in the last 168 hours. Cardiac Enzymes: Recent Labs  Lab 06/25/17 1004 06/25/17 1616 06/25/17 2208  TROPONINI 0.03* 0.03* 0.07*   BNP (last 3 results) No results for input(s): PROBNP in the last 8760 hours. HbA1C: No results  for input(s): HGBA1C in the last 72 hours. CBG: Recent Labs  Lab 06/28/17 1148 06/28/17 1633 06/28/17 2141 06/29/17 0618 06/29/17 1108  GLUCAP 183* 127* 158* 106* 115*   Lipid Profile: No results for input(s): CHOL, HDL, LDLCALC, TRIG, CHOLHDL, LDLDIRECT in the last 72 hours. Thyroid Function Tests: No results for input(s): TSH, T4TOTAL, FREET4, T3FREE, THYROIDAB in the last 72 hours. Anemia Panel: No results for input(s): VITAMINB12, FOLATE, FERRITIN, TIBC, IRON, RETICCTPCT in the last 72 hours. Urine analysis:    Component Value Date/Time   COLORURINE YELLOW 11/15/2013 0231   APPEARANCEUR CLEAR 11/15/2013 0231   LABSPEC 1.011 11/15/2013 0231   PHURINE 5.5 11/15/2013 0231   GLUCOSEU >1000 (A) 11/15/2013 0231   HGBUR MODERATE (A) 11/15/2013 0231   BILIRUBINUR NEGATIVE 11/15/2013  0231   KETONESUR NEGATIVE 11/15/2013 0231   PROTEINUR 30 (A) 11/15/2013 0231   UROBILINOGEN 0.2 11/15/2013 0231   NITRITE NEGATIVE 11/15/2013 0231   LEUKOCYTESUR TRACE (A) 11/15/2013 0231   Sepsis Labs: @LABRCNTIP (procalcitonin:4,lacticidven:4)  )No results found for this or any previous visit (from the past 240 hour(s)).    Radiology Studies: No results found.   Scheduled Meds: . amLODipine  10 mg Oral Daily  . aspirin  81 mg Oral Daily  . carvedilol  6.25 mg Oral BID WC  . ferrous gluconate  324 mg Oral Q breakfast  . folic acid  1 mg Oral Daily  . heparin  5,000 Units Subcutaneous Q8H  . hydrALAZINE  25 mg Oral Q8H  . insulin aspart  0-5 Units Subcutaneous QHS  . insulin aspart  0-9 Units Subcutaneous TID WC  . multivitamin with minerals  1 tablet Oral Daily  . thiamine  100 mg Oral Daily   Continuous Infusions: . sodium chloride 75 mL/hr at 06/29/17 0600     LOS: 2 days   Time Spent in minutes   30 minutes  Seith Aikey D.O. on 06/29/2017 at 11:12 AM  Between 7am to 7pm - Pager - [phone]  After 7pm go to [email or web address] - password [email or web address]  And look for the night coverage person covering for me after hours  [facility] Office  [phone]

## 2017-06-29 NOTE — Op Note (Signed)
Timothy [name] 06/29/2017 Diagnosis: tractional retinal detachment left eye  Procedure: [place] Vitrectomy, Membrane Peeling, Fluid Gas Exchange, Silicone Oil, membranectomy and endolaser Operative Eye:  left eye  Surgeon: Royston Cowper Estimated Blood Loss: minimal Specimens for Pathology:  None Complications: none    The  patient was prepped and draped in the usual fashion for ocular surgery on the  left eye .  A lid speculum was placed.  Infusion line and trocar was placed at the 4 o'clock position approximately 3.5 mm from the surgical limbus.   The infusion line was allowed to run and then clamped when placed at the cannula opening. The line was inserted and secured to the drape with an adhesive strip.   Active trocars/cannula were placed at the 10 and 2 o'clock positions approximately 3.5 mm from the surgical limbus. The cannula was visualized in the vitreous cavity.  The light pipe and vitreous cutter were inserted into the vitreous cavity and a core vitrectomy was performed.  Care taken to remove the vitreous up to the vitreous base for 360 degrees.   Careful peripheral vitrectomy was performed to shave vitreous from the vitreous base as well as remove any traction.  On tear was noted at 2 o'clock.  Segmentation and delamination approach was used to separate the scar tissue overlying the macula and creating the tractional retinal detachment.  Attention was directed toward limiting any traction on the posterior hyaloid, given the extent of detachment care was taken to remove the vitreous without mobilization of the retina. The neovascular fibrotic tractional detachment was removed and membrane peeled with segmentation and delamination technique. Following core vitrectomy, careful indentied peripheral vitrectomy was performed. Following careful peripheral vitrectomy, attention was directed to the endolaser portion of the procedure. Endolaser was used to cauterize areas of  neovascularization to limit bleeding. The soft tipped extrusion canula was used to attain a complete air fluid exchange. Endolaser was applied to areas of ischemia.   Endolaser was applied 360 degrees with the aid of scleral indentation to ensure adequate demarcation of the peripheral retina.  Air fluid exchange was performed and Silicone oil was then placed in the eye to achieve a near complete oil fill.  The trocars were sequentially removed and noted to be air tight.  Normal intraocular pressure was noted by digital palpapation.  Subconjunctival injections of Ancef Dexamethasone 4mg /57ml were placed.   The speculum and drapes were removed and the eye was patched with Polymixin/Bacitracin ophthalmic ointment. An eye shield was placed and the patient was transferred alert and conversant with stable vital signs to the post operative recovery area.  The patient tolerated the procedure well and no complications were noted.  Royston Cowper MD

## 2017-06-29 NOTE — Progress Notes (Signed)
Language Services contacted.  Interpretor Leodis Liverpool Eban requested to be here around 1500 today for pt surgical prep/consent.  Surgery is scheduled at 1700.

## 2017-06-29 NOTE — Anesthesia Preprocedure Evaluation (Addendum)
Anesthesia Evaluation  Patient identified by MRN, date of birth, ID band Patient awake    Reviewed: Allergy & Precautions, NPO status , Patient's Chart, lab work & pertinent test results  Airway Mallampati: II  TM Distance: >3 FB Neck ROM: Full    Dental  (+) Dental Advisory Given, Poor Dentition, Missing   Pulmonary former smoker,    Pulmonary exam normal breath sounds clear to auscultation       Cardiovascular hypertension, Pt. on medications and Pt. on home beta blockers Normal cardiovascular exam Rhythm:Regular Rate:Normal  2/8 Echo: LVEF 55-60%, normal wall thickness, normal wall motion, grade 1  DD, indeterminate LV filling pressure, aortic valve sclerosis  with trivial AI, trivial MR, normal biatrial size, no significant  TR, normal IVC.   Neuro/Psych Legally blind negative psych ROS   GI/Hepatic negative GI ROS, ?hx EtOH abuse   Endo/Other  diabetes, Type 2  Renal/GU Renal InsufficiencyRenal diseasenegative Renal ROS  negative genitourinary   Musculoskeletal negative musculoskeletal ROS (+)   Abdominal   Peds  Hematology  (+) anemia ,   Anesthesia Other Findings   Reproductive/Obstetrics                             Lab Results  Component Value Date   WBC 10.0 06/27/2017   HGB 9.8 (L) 06/27/2017   HCT 30.9 (L) 06/27/2017   MCV 75.9 (L) 06/27/2017   PLT 366 06/27/2017   Lab Results  Component Value Date   CREATININE 1.89 (H) 06/29/2017   BUN 32 (H) 06/29/2017   NA 138 06/29/2017   K 5.0 06/29/2017   CL 110 06/29/2017   CO2 20 (L) 06/29/2017    Anesthesia Physical  Anesthesia Plan  ASA: III  Anesthesia Plan: General   Post-op Pain Management:    Induction: Intravenous  PONV Risk Score and Plan: 2 and Treatment may vary due to age or medical condition, Ondansetron and Dexamethasone  Airway Management Planned: Oral ETT  Additional Equipment: None  Intra-op  Plan:   Post-operative Plan: Extubation in OR  Informed Consent: I have reviewed the patients History and Physical, chart, labs and discussed the procedure including the risks, benefits and alternatives for the proposed anesthesia with the patient or authorized representative who has indicated his/her understanding and acceptance.   Dental advisory given  Plan Discussed with: CRNA and Surgeon  Anesthesia Plan Comments:        Anesthesia Quick Evaluation

## 2017-06-29 NOTE — Anesthesia Procedure Notes (Signed)
Procedure Name: Intubation Date/Time: 06/29/2017 5:43 PM Performed by: Shirlyn Goltz, CRNA Pre-anesthesia Checklist: Patient identified, Emergency Drugs available, Suction available and Patient being monitored Patient Re-evaluated:Patient Re-evaluated prior to induction Oxygen Delivery Method: Circle system utilized Preoxygenation: Pre-oxygenation with 100% oxygen Induction Type: IV induction Ventilation: Mask ventilation without difficulty Laryngoscope Size: Mac and 3 Grade View: Grade I Tube type: Oral Tube size: 7.0 mm Number of attempts: 1 Airway Equipment and Method: Stylet Placement Confirmation: ETT inserted through vocal cords under direct vision,  positive ETCO2 and breath sounds checked- equal and bilateral Secured at: 20 cm Tube secured with: Tape Dental Injury: Teeth and Oropharynx as per pre-operative assessment

## 2017-06-29 NOTE — Transfer of Care (Signed)
Immediate Anesthesia Transfer of Care Note  Patient: Timothy [name]  Procedure(s) Performed: PARS PLANA VITRECTOMY WITH 25 GAUGE AND MEMBRANE PEEL WITH GAS EXCHANGE, AIR SILICONE OIL AND PHACO COAGULATION, LASER (Left )  Patient Location: PACU  Anesthesia Type:General  Level of Consciousness: awake and alert   Airway & Oxygen Therapy: Patient Spontanous Breathing  Post-op Assessment: Report given to RN and Post -op Vital signs reviewed and stable  Post vital signs: Reviewed and stable  Last Vitals:  Vitals:   06/29/17 1700 06/29/17 1930  BP: (!) 201/52 (!) 138/59  Pulse:  65  Resp:  18  Temp:  (!) 36.2 C  SpO2:  93%    Last Pain:  Vitals:   06/29/17 1930  TempSrc:   PainSc: Asleep         Complications: No apparent anesthesia complications

## 2017-06-29 NOTE — H&P (Signed)
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 Alcohol abuse    Patient denies, but was shared with people from [facility] by his male roommate.  . Bilateral cataracts 04/08/2017  . DM (diabetes mellitus), type 2 with renal complications ([place])    uncertain when diagnosed  . HOH (hard of hearing)   . Hypertension   . Legally blind    B/L  . Poor vision    Pertinent ocular history:  Severe proliferative diabetic retinopathy  Pertinent family history: No family history on file.  General:  Healthy appearing patient in no distress.    Eyes:               Acuity OD 20/100  OS CF               External: Within normal limits                Anterior segment: Within normal limits                             Fundus: Severe proliferative diabetic retinopathy left eye with tractional retinal detachment.                 Impression: Severe PDR with TRD left eye  Plan: Tractional retinal detachment repair left eye Royston Cowper

## 2017-06-29 NOTE — Brief Op Note (Addendum)
06/29/2017  7:22 PM  PATIENT:  Krishna Kamal  77 y.o. male  PRE-OPERATIVE DIAGNOSIS:   tractional retinal detachment left eye  POST-OPERATIVE DIAGNOSIS:  tractional retinal detachment left eye  PROCEDURE:  Procedure(s): PARS PLANA VITRECTOMY WITH 25 GAUGE AND MEMBRANE PEEL, membranectomy,  Air/fluid EXCHANGE, SILICONE OIL, and  LASER (Left)  SURGEON:  Surgeon(s) and Role:    * Jalene Mullet, MD - Primary  PHYSICIAN ASSISTANT:   ASSISTANTS: none   ANESTHESIA:   general  EBL:  minimal   BLOOD ADMINISTERED:none  DRAINS: none   LOCAL MEDICATIONS USED:  NONE  SPECIMEN:  No Specimen  DISPOSITION OF SPECIMEN:  N/A  COUNTS:  YES  TOURNIQUET:  * No tourniquets in log *  DICTATION: .Note written in EPIC  PLAN OF CARE: return to floor  PATIENT DISPOSITION:  PACU - hemodynamically stable.   Delay start of Pharmacological VTE agent (>24hrs) due to surgical blood loss or risk of bleeding: not applicable

## 2017-06-29 NOTE — Discharge Instructions (Signed)
Sleep on side.  During day keep upright.  Do not sleep on back

## 2017-06-30 ENCOUNTER — Other Ambulatory Visit: Payer: Self-pay

## 2017-06-30 ENCOUNTER — Encounter (HOSPITAL_COMMUNITY): Payer: Self-pay | Admitting: Ophthalmology

## 2017-06-30 ENCOUNTER — Telehealth (HOSPITAL_COMMUNITY): Payer: Self-pay | Admitting: Cardiology

## 2017-06-30 LAB — BASIC METABOLIC PANEL
ANION GAP: 10 (ref 5–15)
BUN: 30 mg/dL — ABNORMAL HIGH (ref 6–20)
CALCIUM: 9 mg/dL (ref 8.9–10.3)
CO2: 17 mmol/L — ABNORMAL LOW (ref 22–32)
Chloride: 113 mmol/L — ABNORMAL HIGH (ref 101–111)
Creatinine, Ser: 1.82 mg/dL — ABNORMAL HIGH (ref 0.61–1.24)
GFR, EST AFRICAN AMERICAN: 39 mL/min — AB (ref >60)
GFR, EST NON AFRICAN AMERICAN: 34 mL/min — AB (ref >60)
GLUCOSE: 160 mg/dL — AB (ref 65–99)
POTASSIUM: 5.4 mmol/L — AB (ref 3.5–5.1)
Sodium: 140 mmol/L (ref 135–145)

## 2017-06-30 LAB — CBC
HCT: 34.2 % — ABNORMAL LOW (ref 39.0–52.0)
Hemoglobin: 10.6 g/dL — ABNORMAL LOW (ref 13.0–17.0)
MCH: 23.9 pg — ABNORMAL LOW (ref 26.0–34.0)
MCHC: 31 g/dL (ref 30.0–36.0)
MCV: 77 fL — AB (ref 78.0–100.0)
PLATELETS: 316 10*3/uL (ref 150–400)
RBC: 4.44 MIL/uL (ref 4.22–5.81)
RDW: 14.4 % (ref 11.5–15.5)
WBC: 13.7 10*3/uL — AB (ref 4.0–10.5)

## 2017-06-30 LAB — GLUCOSE, CAPILLARY
Glucose-Capillary: 143 mg/dL — ABNORMAL HIGH (ref 65–99)
Glucose-Capillary: 235 mg/dL — ABNORMAL HIGH (ref 65–99)
Glucose-Capillary: 153 mg/dL — ABNORMAL HIGH (ref 65–99)
Glucose-Capillary: 157 mg/dL — ABNORMAL HIGH (ref 65–99)

## 2017-06-30 MED ORDER — OFLOXACIN 0.3 % OP SOLN
1.0000 [drp] | Freq: Four times a day (QID) | OPHTHALMIC | Status: DC
  Administered 2017-07-01 (×3): 1 [drp] via OPHTHALMIC
  Filled 2017-06-30: qty 5

## 2017-06-30 MED ORDER — SODIUM POLYSTYRENE SULFONATE 15 GM/60ML PO SUSP
15.0000 g | Freq: Once | ORAL | Status: AC
  Administered 2017-06-30: 15 g via ORAL
  Filled 2017-06-30: qty 60

## 2017-06-30 MED ORDER — PREDNISOLONE ACETATE 1 % OP SUSP
1.0000 [drp] | Freq: Four times a day (QID) | OPHTHALMIC | Status: DC
  Administered 2017-07-01 (×3): 1 [drp] via OPHTHALMIC
  Filled 2017-06-30: qty 1

## 2017-06-30 MED ORDER — HYDRALAZINE HCL 50 MG PO TABS
50.0000 mg | ORAL_TABLET | Freq: Three times a day (TID) | ORAL | Status: DC
  Administered 2017-06-30 – 2017-07-01 (×3): 50 mg via ORAL
  Filled 2017-06-30 (×3): qty 1

## 2017-06-30 MED ORDER — HYDRALAZINE HCL 20 MG/ML IJ SOLN
10.0000 mg | Freq: Once | INTRAMUSCULAR | Status: AC
  Administered 2017-06-30: 10 mg via INTRAVENOUS
  Filled 2017-06-30: qty 1

## 2017-06-30 NOTE — Progress Notes (Signed)
PROGRESS NOTE    Timothy [name]  SFK:[id number] DOB: 03-Jan-1941 DOA: 06/25/2017 PCP: No primary care provider on file.   No chief complaint on file.   Brief Narrative:  HPI on 06/25/2017 by Ms. Timothy Carrel, NP [name] is a 77 y.o. male with medical history significant for hypertension, diabetes, alcohol abuse, traumatic brain injury, subdural hematoma, legally blind, chronic kidney disease gives a for surgery on his right eye that was canceled due to hypertensive emergency. Systolic blood pressure remained over 200 in spite of labetalol. Surgery was canceled [facility] are asked to admit.  Information is obtained from the patient through an interpreter as well as the chart. The last 6 weeks patient has been living at assisted living center. Prior to that he was homeless because the house he was living in was condemned. Chart review indicates 2015 he had a fall suffered a subdural hematoma. Indicates he was found down on the ground and EMS reportedly been drinking and hit his head on the fall. He did not require surgery that was discharged on anti-seizure medication. Chart review indicates he's only been to the emergency department a couple times in the last couple of years with alcohol intoxication. Interpreter at the bedside confirms patient denies headache visual disturbances syncope or near-syncope. He denies chest pain palpitation shortness of breath lower extremity edema. He denies abdominal pain nausea vomiting diarrhea constipation melena bright red blood per rectum. He thinks his last bowel movement was 2 days ago. He denies dysuria hematuria frequency or urgency. Case manager at the bedside reports he has been at assisted living for a proximal moist 6 weeks and has been getting his medications during that time.  Interim history Admitted for Accelerated hypertension and acute kidney injury. S/p Pars Plana Vitrectomy, Membrane Peeling, Fluid Gas Exchange, Silicone Oil, membranectomy and  endolaser for left retinal detachment.  Assessment & Plan   Hypertensive emergency  -Patient is supposedly been homeless up until 6 weeks ago, suspect patient had noncompliance with medications -Blood pressure on admission 236/68, currently appears to have improved -Home medications listed on metoprolol and ramipril -Continue amlodipine, hydralazine oral (decreased), IV as needed- with holding parameters -Cardiology consulted and appreciated, added Coreg 6.25 mg twice daily -hold chlorthalidone -Discussed BP control with nephrology given worsening creatinine  Acute kidney injury on chronic kidney disease, stage III -ACE inhibitor and chlorthalidone held -baseline hemoglobin approximately 1.5-1.7 -renal ultrasound: Increased cortical echogenicity of bilateral kidneys, suggestive of intrinsic renal disease -Suspect acute kidney injury likely due to uncontrolled hypertension -Dicussed with nephrology, Dr. Jonnie Finner, feels that drastic improvement in BP may have lead to worsening creatinine. Recommended less "tight" control of BP -Creatinine appears to be improving, currently 1.82 -Continue gentle IVF and monitor BMP  Elevated troponin -Troponin peaked at 0.07, likely secondary to hypertension -Echocardiogram shows an EF of 17-49%, grade 1 diastolic dysfunction -As above, cardiology consulted and appreciated, recommended addition of Coreg today and outpatient Lexiscan which cardiology will arrange  Diabetes mellitus, type II -Metformin held -continue insulin sliding scale and CBG monitoring -hemoglobin A1c 7.1  Hyperkalemia -given dose of kayexalate -continue to monitor BMP  Anemia of chronic disease -Hemoglobin appears to be stable, continue to monitor CBC  Closed traumatic brain injury -Had subdural hematoma after a fall in 2015. -Patient only oriented to self at baseline  Diabetic retinopathy -Was scheduled for surgery on the right eye on 2/7/219, however canceled due to the  above -Discussed with Dr. Posey Pronto, ophthalmologist, s/p Pars Plana Vitrectomy, Membrane Peeling,  Fluid Gas Exchange, Silicone Oil, membranectomy and endolaser  DVT Prophylaxis   heparin  Code Status: Full  Family Communication: None at bedside.  Disposition Plan: Admitted, pending ALF- suspect discharge on 2/13  Consultants Cardiology Ophthalmology  Procedures  Echocardiogram Pars Plana Vitrectomy, Membrane Peeling, Fluid Gas Exchange, Silicone Oil, membranectomy and endolaser  Antibiotics   Anti-infectives (From admission, onward)   Start     Dose/Rate Route Frequency Ordered Stop   06/29/17 1926  ceFAZolin (ANCEF) 200 mg/mL injection  Status:  Discontinued       As needed 06/29/17 1926 06/29/17 1929   06/29/17 1630  ceFAZolin (ANCEF) 100 mg/0.5 mL subconjunctival injection 200 mg     200 mg Subconjunctival To Surgery 06/29/17 1620 06/30/17 1630   06/29/17 1630  ceFAZolin (ANCEF) 100 mg/0.5 mL subconjunctival injection 200 mg     200 mg Subconjunctival To Surgery 06/29/17 1620 06/30/17 1630   06/25/17 0745  ceFAZolin (ANCEF) 100 mg/0.5 mL subconjunctival injection 100 mg  Status:  Discontinued     100 mg Subconjunctival To Surgery 06/25/17 0733 06/25/17 0941      Subjective:   [name] seen and examined today.  Phone translator used. Patient speaks [place].  Denies pain today. Ready to go home.   Objective:   Vitals:   06/30/17 0407 06/30/17 0512 06/30/17 0807 06/30/17 0901  BP: (!) 151/66 (!) 142/64 (!) 178/71   Pulse: 74 70  83  Resp: 18 19 20    Temp: (!) 97.5 F (36.4 C)     TempSrc: Oral     SpO2: 100% 100% 100%   Weight: 42.3 kg (93 lb 4.8 oz)     Height:        Intake/Output Summary (Last 24 hours) at 06/30/2017 1308 Last data filed at 06/30/2017 1115 Gross per 24 hour  Intake 1685 ml  Output 101 ml  Net 1584 ml   Filed Weights   06/28/17 0338 06/29/17 0410 06/30/17 0407  Weight: 40.2 kg (88 lb 10 oz) 41.7 kg (92 lb) 42.3 kg (93 lb 4.8 oz)    Exam  General: Well developed, well nourished, NAD, appears stated age  HEENT: NCAT, mucous membranes moist. Dressing on left eye  Neck: Supple, no JVD, no masses  Cardiovascular: S1 S2 auscultated, no rubs, murmurs or gallops. Regular rate and rhythm.  Respiratory: Clear to auscultation bilaterally with equal chest rise  Abdomen: Soft, nontender, nondistended, + bowel sounds  Extremities: warm dry without cyanosis clubbing or edema  Data Reviewed: I have personally reviewed following labs and imaging studies  CBC: Recent Labs  Lab 06/25/17 0648 06/26/17 0254 06/27/17 0158 06/30/17 0232  WBC  --  13.8* 10.0 13.7*  HGB 10.9* 10.5* 9.8* 10.6*  HCT  --  33.0* 30.9* 34.2*  MCV  --  75.0* 75.9* 77.0*  PLT  --  350 366 937   Basic Metabolic Panel: Recent Labs  Lab 06/25/17 0950  06/26/17 0254 06/27/17 0158 06/28/17 0329 06/29/17 0225 06/30/17 0232  NA  --    < > 138 136 137 138 140  K  --    < > 4.5 4.7 4.8 5.0 5.4*  CL  --    < > 105 102 106 110 113*  CO2  --    < > 20* 23 23 20* 17*  GLUCOSE  --    < > 133* 111* 117* 112* 160*  BUN  --    < > 36* 39* 39* 32* 30*  CREATININE 2.16*   < >  2.07* 2.43* 2.26* 1.89* 1.82*  CALCIUM  --    < > 9.3 8.6* 8.6* 8.5* 9.0  MG 2.2  --   --   --   --   --   --    < > = values in this interval not displayed.   GFR: Estimated Creatinine Clearance: 20 mL/min (A) (by C-G formula based on SCr of 1.82 mg/dL (H)). Liver Function Tests: Recent Labs  Lab 06/26/17 0254  AST 14*  ALT 5*  ALKPHOS 74  BILITOT 0.6  PROT 6.4*  ALBUMIN 2.8*   No results for input(s): LIPASE, AMYLASE in the last 168 hours. No results for input(s): AMMONIA in the last 168 hours. Coagulation Profile: No results for input(s): INR, PROTIME in the last 168 hours. Cardiac Enzymes: Recent Labs  Lab 06/25/17 1004 06/25/17 1616 06/25/17 2208  TROPONINI 0.03* 0.03* 0.07*   BNP (last 3 results) No results for input(s): PROBNP in the last 8760  hours. HbA1C: No results for input(s): HGBA1C in the last 72 hours. CBG: Recent Labs  Lab 06/29/17 1556 06/29/17 1932 06/29/17 2051 06/30/17 0534 06/30/17 1124  GLUCAP 101* 93 118* 143* 235*   Lipid Profile: No results for input(s): CHOL, HDL, LDLCALC, TRIG, CHOLHDL, LDLDIRECT in the last 72 hours. Thyroid Function Tests: No results for input(s): TSH, T4TOTAL, FREET4, T3FREE, THYROIDAB in the last 72 hours. Anemia Panel: No results for input(s): VITAMINB12, FOLATE, FERRITIN, TIBC, IRON, RETICCTPCT in the last 72 hours. Urine analysis:    Component Value Date/Time   COLORURINE YELLOW 11/15/2013 0231   APPEARANCEUR CLEAR 11/15/2013 0231   LABSPEC 1.011 11/15/2013 0231   PHURINE 5.5 11/15/2013 0231   GLUCOSEU >1000 (A) 11/15/2013 0231   HGBUR MODERATE (A) 11/15/2013 0231   BILIRUBINUR NEGATIVE 11/15/2013 0231   KETONESUR NEGATIVE 11/15/2013 0231   PROTEINUR 30 (A) 11/15/2013 0231   UROBILINOGEN 0.2 11/15/2013 0231   NITRITE NEGATIVE 11/15/2013 0231   LEUKOCYTESUR TRACE (A) 11/15/2013 0231   Sepsis Labs: @LABRCNTIP (procalcitonin:4,lacticidven:4)  )No results found for this or any previous visit (from the past 240 hour(s)).    Radiology Studies: No results found.   Scheduled Meds: . amLODipine  10 mg Oral Daily  . aspirin  81 mg Oral Daily  . carvedilol  6.25 mg Oral BID WC  . ceFAZolin  200 mg Subconjunctival To OR  . ceFAZolin  200 mg Subconjunctival To OR  . ferrous gluconate  324 mg Oral Q breakfast  . folic acid  1 mg Oral Daily  . heparin  5,000 Units Subcutaneous Q8H  . hydrALAZINE  25 mg Oral Q8H  . insulin aspart  0-5 Units Subcutaneous QHS  . insulin aspart  0-9 Units Subcutaneous TID WC  . multivitamin with minerals  1 tablet Oral Daily  . thiamine  100 mg Oral Daily   Continuous Infusions: . sodium chloride 75 mL/hr at 06/30/17 1015     LOS: 3 days   Time Spent in minutes   30 minutes  Oniya Mandarino D.O. on 06/30/2017 at 1:08 PM  Between  7am to 7pm - Pager - [phone]  After 7pm go to [email or web address] - password [email or web address]  And look for the night coverage person covering for me after hours  [facility] Office  [phone]

## 2017-06-30 NOTE — Clinical Social Work Note (Signed)
Clinical Social Work Assessment  Patient Details  Name: Timothy [name] MRN: [id number] Date of Birth: 1940/10/03  Date of referral:  06/30/17               Reason for consult:  Discharge Planning                Permission sought to share information with:  Family Supports Permission granted to share information::  Yes, Verbal Permission Granted  Name::     [name] [phone]) and Vung [name] [phone])   Agency::  [facility]  Relationship::  friend/  Contact Information:  number is listed above  Housing/Transportation Living arrangements for the past 2 months:  [place] of Information:  Patient, Friend/Neighbor Patient Interpreter Needed:  Other (Comment Required)(Jarai or rhade) Criminal Activity/Legal Involvement Pertinent to Current Situation/Hospitalization:  No - Comment as needed Significant Relationships:  Warehouse manager Lives with:  Facility Resident Do you feel safe going back to the place where you live?  Yes Need for family participation in patient care:  Yes (Comment)  Care giving concerns:  No family at bedside. CSW met patient with community liaison Vung present to translate     Social Worker assessment / plan: Timothy [name] is a Financial trader that assisted in placing patient at [employer]. Vung was able to translate for CSW as patient speaks "[place]". Patient stated he is willing to go back to facility but stated he would rather go back home to [place]. [facility] will be able to pick up patient as long as it is before 3pm. Patient stated that his a little confused because his vision is still blurry and he is having troubles communicating due to language barriers   Employment status:  Retired Nurse, adult PT Recommendations:  Not assessed at this time Information / Referral to community resources:  Other (Comment Required)  Patient/Family's Response to care:  Patient has support from community  liaison   Patient/Family's Understanding of and Emotional Response to Diagnosis, Current Treatment, and Prognosis:  Patient to return back to facility   Emotional Assessment Appearance:  Appears stated age Attitude/Demeanor/Rapport:  Other(pt seemed very anxious about not being able to [place] where he is) Affect (typically observed):  Accepting Orientation:  Oriented to Self, Oriented to Situation, Oriented to Place, Oriented to  Time Alcohol / Substance use:  Not Applicable Psych involvement (Current and /or in the community):  No (Comment)  Discharge Needs  Concerns to be addressed:  Medication Concerns, Other (Comment Required(friends want to make sure bp medication is adressed) Readmission within the last 30 days:  No Current discharge risk:  None Barriers to Discharge:  No Barriers Identified   [name], LCSW 06/30/2017, 5:15 PM

## 2017-06-30 NOTE — Progress Notes (Signed)
Date of examination:  06/30/17  HPI: POD#1 s/p TRD repair left eye, patient denies pain  Pertinent past medical history:  Past Medical History:  Diagnosis Date  . Alcohol abuse    Patient denies, but was shared with people from [facility] by his male roommate.  . Bilateral cataracts 04/08/2017  . DM (diabetes mellitus), type 2 with renal complications ([place])    uncertain when diagnosed  . HOH (hard of hearing)   . Hypertension   . Legally blind    B/L  . 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 No pertinent family history.  General:  Healthy appearing patient in no distress.    Eyes:    Acuity  OS HM    External: Within normal limits     Anterior segment: Within normal limits      Fundus: preretinal heme, peripheral laser, retina attached.   Impression: POD#1 s/p TRD repair OS - doing well.  Pred Forte and Ofloxacin QID, Maxitrol QHS  Plan: F/u with Dr. Posey Pronto in 1 wk  Royston Cowper

## 2017-07-01 ENCOUNTER — Encounter (HOSPITAL_COMMUNITY): Payer: Self-pay | Admitting: Ophthalmology

## 2017-07-01 LAB — BASIC METABOLIC PANEL
ANION GAP: 9 (ref 5–15)
BUN: 38 mg/dL — ABNORMAL HIGH (ref 6–20)
CALCIUM: 8.5 mg/dL — AB (ref 8.9–10.3)
CO2: 20 mmol/L — ABNORMAL LOW (ref 22–32)
Chloride: 109 mmol/L (ref 101–111)
Creatinine, Ser: 1.7 mg/dL — ABNORMAL HIGH (ref 0.61–1.24)
GFR calc Af Amer: 43 mL/min — ABNORMAL LOW (ref >60)
GFR, EST NON AFRICAN AMERICAN: 37 mL/min — AB (ref >60)
GLUCOSE: 132 mg/dL — AB (ref 65–99)
Potassium: 4.3 mmol/L (ref 3.5–5.1)
SODIUM: 138 mmol/L (ref 135–145)

## 2017-07-01 LAB — GLUCOSE, CAPILLARY
GLUCOSE-CAPILLARY: 106 mg/dL — AB (ref 65–99)
Glucose-Capillary: 107 mg/dL — ABNORMAL HIGH (ref 65–99)

## 2017-07-01 MED ORDER — CARVEDILOL 6.25 MG PO TABS: 6.2500 mg | ORAL_TABLET | Freq: Two times a day (BID) | ORAL | 3 refills | Status: DC

## 2017-07-01 MED ORDER — ASPIRIN 81 MG PO CHEW: 81.0000 mg | CHEWABLE_TABLET | Freq: Every day | ORAL | Status: DC

## 2017-07-01 MED ORDER — PREDNISOLONE ACETATE 1 % OP SUSP: 1.0000 [drp] | Freq: Four times a day (QID) | OPHTHALMIC | 0 refills | Status: DC

## 2017-07-01 MED ORDER — AMLODIPINE BESYLATE 10 MG PO TABS: 10.0000 mg | ORAL_TABLET | Freq: Every day | ORAL | 3 refills | Status: DC

## 2017-07-01 MED ORDER — OFLOXACIN 0.3 % OP SOLN: 1.0000 [drp] | Freq: Four times a day (QID) | OPHTHALMIC | 0 refills | Status: DC

## 2017-07-01 NOTE — Anesthesia Postprocedure Evaluation (Signed)
Anesthesia Post Note  Patient: Timothy [name]  Procedure(s) Performed: PARS PLANA VITRECTOMY WITH 25 GAUGE AND MEMBRANE PEEL WITH GAS EXCHANGE, AIR SILICONE OIL AND PHACO COAGULATION, LASER (Left )     Patient location during evaluation: PACU Anesthesia Type: General Level of consciousness: awake and alert Pain management: pain level controlled Vital Signs Assessment: post-procedure vital signs reviewed and stable Respiratory status: spontaneous breathing, nonlabored ventilation, respiratory function stable and patient connected to nasal cannula oxygen Cardiovascular status: blood pressure returned to baseline and stable Postop Assessment: no apparent nausea or vomiting Anesthetic complications: no    Last Vitals:  Vitals:   07/01/17 0439 07/01/17 0805  BP: (!) 137/55 (!) 142/54  Pulse: 73 79  Resp: (!) 21 13  Temp: 36.6 C 36.6 C  SpO2:      Last Pain:  Vitals:   07/01/17 0805  TempSrc: Oral  PainSc:                  [place] S

## 2017-07-01 NOTE — NC FL2 (Signed)
[place] MEDICAID FL2 LEVEL OF CARE SCREENING TOOL     IDENTIFICATION  Patient Name: Timothy [name] Birthdate: 02-Dec-1940 Sex: male Admission Date (Current Location): 06/25/2017  [facility] and [place] Number:  Herbalist and Address:  [facility] [place]. [facility], [place] [address] [place], [place] [address]      Provider Number: [id number]  Attending Physician Name and Address:  Nita Sells, MD  Relative Name and Phone Number:       Current Level of Care: Hospital Recommended Level of Care: [place] Prior Approval Number:    Date Approved/Denied:   PASRR Number:    Discharge Plan: Other (Comment)(ALF)    Current Diagnoses: Patient Active Problem List   Diagnosis Date Noted  . Hypertension 06/26/2017  . Hypertensive emergency 06/25/2017  . Acute kidney injury superimposed on chronic kidney disease ([place]) 06/25/2017  . Hyperkalemia 06/25/2017  . 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 Left retinal detachment   . Diabetic retinopathy associated with type 2 diabetes mellitus ([place]) 04/08/2017  . Bilateral cataracts 04/08/2017  . DM (diabetes mellitus), type 2 with renal complications ([place])   . Alcohol abuse   . Poor vision   . Chronic kidney disease 02/13/2017  . 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 Epigastric pain 02/11/2017  . Fall at home 11/16/2013  . Closed TBI (traumatic brain injury) ([place]) 11/16/2013  . SDH (subdural hematoma) ([facility]) 11/15/2013    Orientation RESPIRATION BLADDER Height & Weight     Self  Normal Continent Weight: 93 lb 4.8 oz (42.3 kg) Height:  5\' 1"  (154.9 cm)  BEHAVIORAL SYMPTOMS/MOOD NEUROLOGICAL BOWEL NUTRITION STATUS      Continent Diet(regular- NAS)  AMBULATORY STATUS COMMUNICATION OF NEEDS Skin   Limited Assist Verbally Normal                       Personal Care Assistance Level of Assistance  Bathing, Dressing Bathing Assistance: Limited assistance    Dressing Assistance: Limited assistance     Functional Limitations Info             SPECIAL CARE FACTORS FREQUENCY                       Contractures      Additional Factors Info  Code Status, Allergies Code Status Info: FULL Allergies Info: NKA           Current Medications (07/01/2017):  This is the current hospital active medication list Current Facility-Administered Medications  Medication Dose Route Frequency Provider Last Rate Last Dose  . acetaminophen (TYLENOL) tablet 650 mg  650 mg Oral Q6H PRN Radene Gunning, NP       Or  . acetaminophen (TYLENOL) suppository 650 mg  650 mg Rectal Q6H PRN Radene Gunning, NP      . amLODipine (NORVASC) tablet 10 mg  10 mg Oral Daily Cristal Ford, DO   10 mg at 07/01/17 0900  . aspirin chewable tablet 81 mg  81 mg Oral Daily Pixie Casino, MD   81 mg at 07/01/17 0867  . carvedilol (COREG) tablet 6.25 mg  6.25 mg Oral BID WC Mikhail, Maryann, DO   6.25 mg at 07/01/17 0900  . ferrous gluconate (FERGON) tablet 324 mg  324 mg Oral Q breakfast Radene Gunning, NP   324 mg at 07/01/17 0900  . folic acid (FOLVITE) tablet 1 mg  1 mg  Oral Daily Radene Gunning, NP   1 mg at 07/01/17 0900  . heparin injection 5,000 Units  5,000 Units Subcutaneous Q8H Radene Gunning, NP   5,000 Units at 07/01/17 0533  . hydrALAZINE (APRESOLINE) injection 10 mg  10 mg Intravenous Q4H PRN Cristal Ford, DO   10 mg at 06/30/17 1430  . hydrALAZINE (APRESOLINE) tablet 50 mg  50 mg Oral Q8H Mikhail, [place], DO   50 mg at 07/01/17 0534  . insulin aspart (novoLOG) injection 0-5 Units  0-5 Units Subcutaneous QHS Black, Karen M, NP      . insulin aspart (novoLOG) injection 0-9 Units  0-9 Units Subcutaneous TID WC Radene Gunning, NP   2 Units at 06/30/17 1737  . multivitamin with minerals tablet 1 tablet  1 tablet Oral Daily Radene Gunning, NP   1 tablet at 07/01/17 0900  . ofloxacin (OCUFLOX) 0.3 % ophthalmic solution 1 drop  1 drop Left Eye QID Jalene Mullet, MD   1 drop at 07/01/17 0902  . ondansetron (ZOFRAN) tablet 4 mg  4 mg Oral Q6H PRN Radene Gunning, NP       Or  . ondansetron [facility]) injection 4 mg  4 mg Intravenous Q6H PRN Black, Lezlie Octave, NP      . prednisoLONE acetate (PRED FORTE) 1 % ophthalmic suspension 1 drop  1 drop Left Eye QID Jalene Mullet, MD   1 drop at 07/01/17 0910  . thiamine (VITAMIN B-1) tablet 100 mg  100 mg Oral Daily Purohit, Shrey C, MD   100 mg at 07/01/17 0900     Discharge Medications: Please see discharge summary for a list of discharge medications.  Relevant Imaging Results:  Relevant Lab Results:   Additional Information    Jorge Ny, LCSW

## 2017-07-01 NOTE — Progress Notes (Signed)
D/c instructions given to pt with translator. IV removed, clean and intact. Telemetry d/c. [facility] to provide transportation back to ALF.   Clyde Canterbury, RN

## 2017-07-01 NOTE — Progress Notes (Signed)
[facility] will [facility] to [facility] Anticipated discharge date: 2/13 Family notified: pt Therapist, occupational by ALF- will pick up at 2pm  CSW signing off.  Jorge Ny, LCSW Clinical Social Worker [phone]

## 2017-07-01 NOTE — Discharge Summary (Signed)
Physician Discharge Summary  Mishon Blubaugh OPF:[id number] DOB: 1940/07/16 DOA: 06/25/2017  PCP: No primary care provider on file.  Admit date: 06/25/2017 Discharge date: 07/01/2017  Time spent: 35 minutes  Recommendations for Outpatient Follow-up:  1. Recommend that the patient be seen by Dr. Posey Pronto of ophthalmology in about 1 week and has been given drops regarding his visual issues 2. Basic metabolic panel in addition to CBC in about 1 week 3. Medications have been adjusted this hospital stay in 39-month supply of antihypertensives have been given however patient will need labs as above given mild renal insufficiency 4. 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 ([place])   Bilateral cataracts   Acute kidney injury superimposed on chronic kidney disease ([place])   Hyperkalemia   Legally blind   Left retinal detachment   Hypertension   Discharge Condition: Improved  Diet recommendation: Heart healthy low-salt  Filed Weights   06/28/17 0338 06/29/17 0410 06/30/17 0407  Weight: 40.2 kg (88 lb 10 oz) 41.7 kg (92 lb) 42.3 kg (93 lb 4.8 oz)    History of present illness:  74 [place] male with HTN DM TY 2 EtOH abuse, 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 chronic kidney disease and legal blindness was admitted by [facility] hospital[facility] after an elective  surgery for vision was canceled because of systolic blood pressures over 200  Hospital Course:  Hypertensive urgency placed on amlodipine as well as Coreg and discontinued ramipril this admission after cardiology saw the patient Acute kidney injury Baseline creatinine around 1.5 went up as high as 1.8 and then down to 1.7-IV fluids discontinued and stabilized Elevated troponin EF  55-60% Lexiscan to be organized as an outpatient, continue home meds Diabetes mellitus type 2 metformin was started and sliding scale coverage was performed A1c was 7.1 will need to continue the same Transient hyperkalemia Given a dose of Kayexalate and stabilized Anemia of chronic disease hemoglobin stable Diabetic retinopathy-he had a pars plana vitrectomy membrane peeling fluid gas exchange and was placed on multiple eyedrops which were continued on discharge  Procedures: Echo ef 55-60%  Consultations:  Cardiology  Discharge Exam: Vitals:   07/01/17 0900 07/01/17 1158  BP:  (!) 145/67  Pulse: 78   Resp:  14  Temp:    SpO2:      General: eomi pos top changes vision by direct confrontation poor Cardiovascular: s1 s 2nmo m/r/g Respiratory: clear without added sound abd sof tnt nd no rebound no guard Neuro intact  Discharge Instructions   Discharge Instructions    Diet - low sodium heart healthy   Complete by:  As directed    Discharge instructions   Complete by:  As directed    Take your medications as prescribed.  Please get lab studies done in about 1-2 weeks.  Follow up with Primary care for refills on your medications. Please follow with Dr. Posey Pronto of Ophthalmology in 1 weeks time for further check-ups   Increase activity slowly   Complete by:  As directed      Allergies as of 07/01/2017   No Known Allergies     Medication List    STOP taking these medications   metoprolol tartrate 25 MG tablet Commonly known as:  LOPRESSOR  ramipril 2.5 MG capsule Commonly known as:  ALTACE     TAKE these medications   amLODipine 10 MG tablet Commonly known as:  NORVASC Take 1 tablet (10 mg total) by mouth daily. Start taking on:  07/02/2017   aspirin 81 MG chewable tablet Chew 1 tablet (81 mg total) by mouth daily. Start taking on:  07/02/2017   carvedilol 6.25 MG tablet Commonly known as:  COREG Take 1 tablet (6.25 mg total) by mouth 2 (two) times daily with a  meal.   ferrous gluconate 324 MG tablet Commonly known as:  FERGON Take 1 tablet (324 mg total) by mouth daily with breakfast. What changed:  additional instructions   folic acid 1 MG tablet Commonly known as:  FOLVITE Take 1 tablet (1 mg total) by mouth daily. What changed:  additional instructions   metFORMIN 500 MG tablet Commonly known as:  GLUCOPHAGE 1/2 tab by mouth twice daily What changed:    how much to take  how to take this  when to take this  additional instructions   multivitamin with minerals Tabs tablet Take 1 tablet by mouth daily.   ofloxacin 0.3 % ophthalmic solution Commonly known as:  OCUFLOX Place 1 drop into the left eye 4 (four) times daily.   prednisoLONE acetate 1 % ophthalmic suspension Commonly known as:  PRED FORTE Place 1 drop into the left eye 4 (four) times daily.   thiamine 100 MG tablet Take 1 tablet (100 mg total) by mouth daily. What changed:  additional instructions      No Known Allergies Follow-up Information    Hilty, Nadean Corwin, MD Follow up.   Specialty:  Cardiology Why:  our office will call to arrange a stress test in our office and clinic followup with an interpreter  Contact information: [place] [address] [phone]        Jalene Mullet, MD In 1 day.   Specialty:  Ophthalmology Why:  For wound re-check in my office after discharge Contact information: [address] [place] [address] [phone]            The results of significant diagnostics from this hospitalization (including imaging, microbiology, ancillary and laboratory) are listed below for reference.    Significant Diagnostic Studies: US Renal  Result Date: 06/25/2017 CLINICAL DATA:  Hyperkalemia for 1 day. EXAM: RENAL / URINARY TRACT ULTRASOUND COMPLETE COMPARISON:  None. FINDINGS: Right Kidney: Length: 9.9 cm.  Increased cortical echogenicity. Left Kidney: Length: 10.5 cm.  Increased  cortical echogenicity. Bladder: Appears normal for degree of bladder distention. Bilateral ureteral jets were not seen. IMPRESSION: Increased cortical echogenicity of bilateral kidneys, suggestive of intrinsic renal disease. Electronically Signed   By: Fidela Salisbury M.D.   On: 06/25/2017 22:22    Microbiology: No results found for this or any previous visit (from the past 240 hour(s)).   Labs: Basic Metabolic Panel: Recent Labs  Lab 06/25/17 0950  06/27/17 0158 06/28/17 0329 06/29/17 0225 06/30/17 0232 07/01/17 0251  NA  --    < > 136 137 138 140 138  K  --    < > 4.7 4.8 5.0 5.4* 4.3  CL  --    < > 102 106 110 113* 109  CO2  --    < > 23 23 20* 17* 20*  GLUCOSE  --    < > 111* 117* 112* 160* 132*  BUN  --    < > 39* 39* 32*  30* 38*  CREATININE 2.16*   < > 2.43* 2.26* 1.89* 1.82* 1.70*  CALCIUM  --    < > 8.6* 8.6* 8.5* 9.0 8.5*  MG 2.2  --   --   --   --   --   --    < > = values in this interval not displayed.   Liver Function Tests: Recent Labs  Lab 06/26/17 0254  AST 14*  ALT 5*  ALKPHOS 74  BILITOT 0.6  PROT 6.4*  ALBUMIN 2.8*   No results for input(s): LIPASE, AMYLASE in the last 168 hours. No results for input(s): AMMONIA in the last 168 hours. CBC: Recent Labs  Lab 06/25/17 0648 06/26/17 0254 06/27/17 0158 06/30/17 0232  WBC  --  13.8* 10.0 13.7*  HGB 10.9* 10.5* 9.8* 10.6*  HCT  --  33.0* 30.9* 34.2*  MCV  --  75.0* 75.9* 77.0*  PLT  --  350 366 316   Cardiac Enzymes: Recent Labs  Lab 06/25/17 1004 06/25/17 1616 06/25/17 2208  TROPONINI 0.03* 0.03* 0.07*   BNP: BNP (last 3 results) No results for input(s): BNP in the last 8760 hours.  ProBNP (last 3 results) No results for input(s): PROBNP in the last 8760 hours.  CBG: Recent Labs  Lab 06/30/17 1124 06/30/17 1633 06/30/17 2121 07/01/17 0531 07/01/17 1104  GLUCAP 235* 153* 157* 106* 107*       Signed:  Nita Sells MD   [facility] 07/01/2017, 12:15  PM

## 2017-07-08 ENCOUNTER — Telehealth (HOSPITAL_COMMUNITY): Payer: Self-pay | Admitting: *Deleted

## 2017-07-08 NOTE — Telephone Encounter (Signed)
Talked with patients caregiver at [facility] and gave instructions for upcoming nuclear stress test.  Kirstie Peri

## 2017-07-08 NOTE — Telephone Encounter (Signed)
User: Cherie Dark A Date/time: 07/06/17 2:58 PM  Comment: Called Language line and had them to lmsg for him to CB to sch myoview.Vassie Moment  Context:  Outcome: Left Message  Phone number: [phone] Phone Type: Home Phone  Comm. type: Telephone Call type: Outgoing  Contact: Auden, Emrik Relation to patient: Self    User: Cherie Dark A Date/time: 06/30/17 11:13 AM  Comment: Called and lmsg VIA interpretor for the patient to CB to get sch for myoview.   Context:  Outcome: Left Message  Phone number: [phone] Phone Type: Home Phone  Comm. type: Telephone Call type: Outgoing  Contact: Jahsir, Kayzen Relation to patient: Self   Called pt through language line and spoke with [phone]) and I was able to speak with [place] the patient's social worker to schedule the myoview.

## 2017-07-09 ENCOUNTER — Inpatient Hospital Stay (HOSPITAL_COMMUNITY)
Admission: EM | Admit: 2017-07-09 | Discharge: 2017-07-12 | DRG: 195 | Disposition: A | Discharge status: Home / Self Care | Payer: Medicare Other | Attending: Family Medicine | Admitting: Family Medicine

## 2017-07-09 ENCOUNTER — Emergency Department (HOSPITAL_COMMUNITY): Payer: Medicare Other

## 2017-07-09 ENCOUNTER — Telehealth: Payer: Self-pay | Admitting: Internal Medicine

## 2017-07-09 ENCOUNTER — Encounter (HOSPITAL_COMMUNITY): Payer: Self-pay | Admitting: *Deleted

## 2017-07-09 DIAGNOSIS — J111 Influenza due to unidentified influenza virus with other respiratory manifestations: Secondary | ICD-10-CM

## 2017-07-09 DIAGNOSIS — S069X9S Unspecified intracranial injury with loss of consciousness of unspecified duration, sequela: Secondary | ICD-10-CM | POA: Diagnosis not present

## 2017-07-09 DIAGNOSIS — D631 Anemia in chronic kidney disease: Secondary | ICD-10-CM | POA: Diagnosis present

## 2017-07-09 DIAGNOSIS — E1122 Type 2 diabetes mellitus with diabetic chronic kidney disease: Secondary | ICD-10-CM | POA: Diagnosis present

## 2017-07-09 DIAGNOSIS — Y95 Nosocomial condition: Secondary | ICD-10-CM | POA: Diagnosis present

## 2017-07-09 DIAGNOSIS — H9190 Unspecified hearing loss, unspecified ear: Secondary | ICD-10-CM | POA: Diagnosis present

## 2017-07-09 DIAGNOSIS — D638 Anemia in other chronic diseases classified elsewhere: Secondary | ICD-10-CM | POA: Diagnosis present

## 2017-07-09 DIAGNOSIS — N183 Chronic kidney disease, stage 3 (moderate): Secondary | ICD-10-CM | POA: Diagnosis present

## 2017-07-09 DIAGNOSIS — J1000 Influenza due to other identified influenza virus with unspecified type of pneumonia: Secondary | ICD-10-CM | POA: Diagnosis present

## 2017-07-09 DIAGNOSIS — I129 Hypertensive chronic kidney disease with stage 1 through stage 4 chronic kidney disease, or unspecified chronic kidney disease: Secondary | ICD-10-CM | POA: Diagnosis present

## 2017-07-09 DIAGNOSIS — H548 Legal blindness, as defined in USA: Secondary | ICD-10-CM | POA: Diagnosis present

## 2017-07-09 DIAGNOSIS — J189 Pneumonia, unspecified organism: Secondary | ICD-10-CM | POA: Diagnosis present

## 2017-07-09 DIAGNOSIS — F0390 Unspecified dementia without behavioral disturbance: Secondary | ICD-10-CM | POA: Diagnosis present

## 2017-07-09 DIAGNOSIS — S069X9A Unspecified intracranial injury with loss of consciousness of unspecified duration, initial encounter: Secondary | ICD-10-CM | POA: Diagnosis present

## 2017-07-09 DIAGNOSIS — S069XAA Unspecified intracranial injury with loss of consciousness status unknown, initial encounter: Secondary | ICD-10-CM | POA: Diagnosis present

## 2017-07-09 DIAGNOSIS — N189 Chronic kidney disease, unspecified: Secondary | ICD-10-CM | POA: Diagnosis not present

## 2017-07-09 DIAGNOSIS — I10 Essential (primary) hypertension: Secondary | ICD-10-CM | POA: Diagnosis present

## 2017-07-09 LAB — COMPREHENSIVE METABOLIC PANEL
ALT: 8 U/L — ABNORMAL LOW (ref 17–63)
ANION GAP: 10 (ref 5–15)
AST: 26 U/L (ref 15–41)
Albumin: 2.7 g/dL — ABNORMAL LOW (ref 3.5–5.0)
Alkaline Phosphatase: 71 U/L (ref 38–126)
BUN: 44 mg/dL — ABNORMAL HIGH (ref 6–20)
CHLORIDE: 106 mmol/L (ref 101–111)
CO2: 20 mmol/L — AB (ref 22–32)
Calcium: 8.5 mg/dL — ABNORMAL LOW (ref 8.9–10.3)
Creatinine, Ser: 2.21 mg/dL — ABNORMAL HIGH (ref 0.61–1.24)
GFR calc non Af Amer: 27 mL/min — ABNORMAL LOW (ref >60)
GFR, EST AFRICAN AMERICAN: 31 mL/min — AB (ref >60)
Glucose, Bld: 162 mg/dL — ABNORMAL HIGH (ref 65–99)
Potassium: 4.3 mmol/L (ref 3.5–5.1)
SODIUM: 136 mmol/L (ref 135–145)
Total Bilirubin: 0.6 mg/dL (ref 0.3–1.2)
Total Protein: 7.1 g/dL (ref 6.5–8.1)

## 2017-07-09 LAB — INFLUENZA PANEL BY PCR (TYPE A & B)
INFLAPCR: POSITIVE — AB
INFLBPCR: NEGATIVE

## 2017-07-09 LAB — CBC WITH DIFFERENTIAL/PLATELET
Basophils Absolute: 0 10*3/uL (ref 0.0–0.1)
Basophils Relative: 0 %
EOS ABS: 0 10*3/uL (ref 0.0–0.7)
Eosinophils Relative: 0 %
HCT: 31.2 % — ABNORMAL LOW (ref 39.0–52.0)
Hemoglobin: 10.2 g/dL — ABNORMAL LOW (ref 13.0–17.0)
LYMPHS ABS: 2.2 10*3/uL (ref 0.7–4.0)
Lymphocytes Relative: 15 %
MCH: 24.7 pg — AB (ref 26.0–34.0)
MCHC: 32.7 g/dL (ref 30.0–36.0)
MCV: 75.5 fL — ABNORMAL LOW (ref 78.0–100.0)
MONOS PCT: 6 %
Monocytes Absolute: 0.9 10*3/uL (ref 0.1–1.0)
Neutro Abs: 11.6 10*3/uL — ABNORMAL HIGH (ref 1.7–7.7)
Neutrophils Relative %: 79 %
Platelets: 314 10*3/uL (ref 150–400)
RBC: 4.13 MIL/uL — AB (ref 4.22–5.81)
RDW: 14.2 % (ref 11.5–15.5)
WBC: 14.7 10*3/uL — AB (ref 4.0–10.5)

## 2017-07-09 LAB — I-STAT TROPONIN, ED: TROPONIN I, POC: 0.03 ng/mL (ref 0.00–0.08)

## 2017-07-09 LAB — I-STAT CG4 LACTIC ACID, ED: Lactic Acid, Venous: 0.8 mmol/L (ref 0.5–1.9)

## 2017-07-09 LAB — LIPASE, BLOOD: Lipase: 97 U/L — ABNORMAL HIGH (ref 11–51)

## 2017-07-09 MED ORDER — SODIUM CHLORIDE 0.9 % IV SOLN
1.0000 g | INTRAVENOUS | Status: DC
  Administered 2017-07-10 – 2017-07-11 (×2): 1 g via INTRAVENOUS
  Filled 2017-07-09 (×3): qty 1

## 2017-07-09 MED ORDER — OSELTAMIVIR PHOSPHATE 75 MG PO CAPS
75.0000 mg | ORAL_CAPSULE | Freq: Every day | ORAL | Status: DC
  Filled 2017-07-09: qty 1

## 2017-07-09 MED ORDER — VANCOMYCIN HCL IN DEXTROSE 1-5 GM/200ML-% IV SOLN: 1000.0000 mg | INTRAVENOUS | Status: AC

## 2017-07-09 MED ORDER — CARVEDILOL 6.25 MG PO TABS
6.2500 mg | ORAL_TABLET | Freq: Two times a day (BID) | ORAL | Status: DC
  Administered 2017-07-09 – 2017-07-12 (×7): 6.25 mg via ORAL
  Filled 2017-07-09 (×7): qty 1

## 2017-07-09 MED ORDER — SODIUM CHLORIDE 0.9 % IV BOLUS (SEPSIS)
1000.0000 mL | Freq: Once | INTRAVENOUS | Status: AC
  Administered 2017-07-09: 1000 mL via INTRAVENOUS

## 2017-07-09 MED ORDER — ASPIRIN 81 MG PO CHEW
81.0000 mg | CHEWABLE_TABLET | Freq: Every day | ORAL | Status: DC
  Administered 2017-07-09 – 2017-07-12 (×4): 81 mg via ORAL
  Filled 2017-07-09 (×4): qty 1

## 2017-07-09 MED ORDER — CEFEPIME HCL 2 G IJ SOLR: 2.0000 g | Freq: Once | INTRAMUSCULAR | Status: DC

## 2017-07-09 MED ORDER — PREDNISOLONE ACETATE 1 % OP SUSP
1.0000 [drp] | Freq: Four times a day (QID) | OPHTHALMIC | Status: DC
  Administered 2017-07-09 – 2017-07-12 (×12): 1 [drp] via OPHTHALMIC
  Filled 2017-07-09: qty 5

## 2017-07-09 MED ORDER — VANCOMYCIN HCL IN DEXTROSE 750-5 MG/150ML-% IV SOLN
750.0000 mg | INTRAVENOUS | Status: DC
  Administered 2017-07-11: 750 mg via INTRAVENOUS
  Filled 2017-07-09: qty 150

## 2017-07-09 MED ORDER — ENOXAPARIN SODIUM 30 MG/0.3ML ~~LOC~~ SOLN
30.0000 mg | SUBCUTANEOUS | Status: DC
  Administered 2017-07-09 – 2017-07-11 (×3): 30 mg via SUBCUTANEOUS
  Filled 2017-07-09 (×3): qty 0.3

## 2017-07-09 MED ORDER — OSELTAMIVIR PHOSPHATE 30 MG PO CAPS
30.0000 mg | ORAL_CAPSULE | Freq: Every day | ORAL | Status: DC
  Administered 2017-07-09 – 2017-07-12 (×4): 30 mg via ORAL
  Filled 2017-07-09 (×4): qty 1

## 2017-07-09 MED ORDER — OFLOXACIN 0.3 % OP SOLN
1.0000 [drp] | Freq: Four times a day (QID) | OPHTHALMIC | Status: DC
  Administered 2017-07-09 – 2017-07-12 (×12): 1 [drp] via OPHTHALMIC
  Filled 2017-07-09: qty 5

## 2017-07-09 MED ORDER — AMLODIPINE BESYLATE 5 MG PO TABS
10.0000 mg | ORAL_TABLET | Freq: Every day | ORAL | Status: DC
Start: 2017-07-09 — End: 2017-07-12
  Administered 2017-07-09 – 2017-07-12 (×4): 10 mg via ORAL
  Filled 2017-07-09 (×4): qty 2

## 2017-07-09 MED ORDER — SODIUM CHLORIDE 0.9 % IV SOLN
1.0000 g | INTRAVENOUS | Status: AC
  Administered 2017-07-09: 1 g via INTRAVENOUS
  Filled 2017-07-09: qty 1

## 2017-07-09 MED ORDER — VANCOMYCIN HCL IN DEXTROSE 1-5 GM/200ML-% IV SOLN: 1000.0000 mg | Freq: Once | INTRAVENOUS | Status: DC

## 2017-07-09 MED ORDER — ADULT MULTIVITAMIN W/MINERALS CH
1.0000 | ORAL_TABLET | Freq: Every day | ORAL | Status: DC
  Administered 2017-07-09 – 2017-07-12 (×4): 1 via ORAL
  Filled 2017-07-09 (×4): qty 1

## 2017-07-09 MED ORDER — ONDANSETRON HCL 4 MG/2ML IJ SOLN
4.0000 mg | Freq: Once | INTRAMUSCULAR | Status: AC
Start: 2017-07-09 — End: 2017-07-09
  Administered 2017-07-09: 4 mg via INTRAVENOUS
  Filled 2017-07-09: qty 2

## 2017-07-09 MED ORDER — SODIUM CHLORIDE 0.9 % IV SOLN
INTRAVENOUS | Status: AC
  Administered 2017-07-09: 19:00:00 via INTRAVENOUS

## 2017-07-09 NOTE — Progress Notes (Signed)
Pharmacy Antibiotic Note  Timothy [name] is a 77 y.o. male admitted on 07/09/2017 with pneumonia, HCAP, influenza  Pharmacy has been consulted for Vancomycin dosing.  Plan:  Vancomycin 1g IV x1, followed by 750mg  IV q48h.  Measure Vanc peak, trough at steady state.  Follow up renal fxn, culture results, and clinical course.  MD, Consider ordering MRSA PCR to guide antibiotic de-escalation.  Renal dose adjustment  Decrease to oseltamivir 30 mg once daily for influenza treatment while CrCl < 30 ml/min  Decrease to Cefepime 1g IV q24h     Height: 5\' 5"  (165.1 cm) Weight: 120 lb (54.4 kg) IBW/kg (Calculated) : 61.5  Temp (24hrs), Avg:98 F (36.7 C), Min:97.4 F (36.3 C), [name]:98.6 F (37 C)  Recent Labs  Lab 07/09/17 1002 07/09/17 1011  WBC 14.7*  --   CREATININE 2.21*  --   LATICACIDVEN  --  0.80    Estimated Creatinine Clearance: 21.2 mL/min (A) (by C-G formula based on SCr of 2.21 mg/dL (H)).    No Known Allergies  Antimicrobials this admission: 2/21 vancomycin >>  2/21 cefepime >>  2/21 oseltamivir >>  Dose adjustments this admission:   Microbiology results: 2/21 BCx:  2/21 Sputum:  2/21 Influenza A: positive 2/21 Strep pneumo Ur Ag:    Thank you for allowing pharmacy to be a part of this patient's care.  Gretta Arab PharmD, BCPS Pager [phone] 07/09/2017 3:08 PM

## 2017-07-09 NOTE — ED Provider Notes (Signed)
[facility] DEPT Provider Note   CSN: [id number] Arrival date & time: 07/09/17  0901     History   Chief Complaint Chief Complaint  Patient presents with  . flu symptoms  . Emesis    HPI Timothy [name] is a 77 y.o. male history of hypertension, diabetes, legally blind, alcohol abuse, here presenting with nausea vomiting and flu symptoms.  Patient was recently admitted to the hospital for uncontrolled hypertension in the setting of cataract surgery.  Patient was started on blood pressure medicine and eventually discharged to the nursing home about a week ago.  Patient was noted to have flu symptoms for the last 2 days.  I try to verify that with the patient but he is unable to give me details.  Just states that he was not feeling well for the last 2 days but there is no reported fever at the nursing home.  Patient was noted to be vomiting upon arrival to the ED and states that he feels nauseated.  He denies any abdominal pain or urinary symptoms.  Patient has a history of dementia.   The history is provided by the patient and the EMS personnel. A language interpreter was used.   Level V caveat- dementia   Past Medical History:  Diagnosis Date  . Alcohol abuse    Patient denies, but was shared with people from [facility] by his male roommate.  . Bilateral cataracts 04/08/2017  . DM (diabetes mellitus), type 2 with renal complications ([place])    uncertain when diagnosed  . HOH (hard of hearing)   . Hypertension   . Legally blind    B/L  . Poor vision     Patient Active Problem List   Diagnosis Date Noted  . Hypertension 06/26/2017  . Hypertensive emergency 06/25/2017  . Acute kidney injury superimposed on chronic kidney disease ([place]) 06/25/2017  . Hyperkalemia 06/25/2017  . 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 Left retinal detachment   . 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 Bilateral cataracts 04/08/2017  . DM  (diabetes mellitus), type 2 with renal complications ([place])   . Alcohol abuse   . Poor vision   . Chronic kidney disease 02/13/2017  . 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 Epigastric pain 02/11/2017  . Fall at home 11/16/2013  . Closed TBI (traumatic brain injury) ([place]) 11/16/2013  . SDH (subdural hematoma) ([place]) 11/15/2013    Past Surgical History:  Procedure Laterality Date  . PARS PLANA VITRECTOMY Left 06/29/2017   Procedure: PARS PLANA VITRECTOMY WITH 25 GAUGE AND MEMBRANE PEEL WITH GAS EXCHANGE, AIR SILICONE OIL AND PHACO COAGULATION, LASER;  Surgeon: Jalene Mullet, MD;  Location: [facility];  Service: Ophthalmology;  Laterality: Left;       Home Medications    Prior to Admission medications   Medication Sig Start Date End Date Taking? Authorizing Provider  amLODipine (NORVASC) 10 MG tablet Take 1 tablet (10 mg total) by mouth daily. 07/02/17  Yes Nita Sells, MD  aspirin 81 MG chewable tablet Chew 1 tablet (81 mg total) by mouth daily. 07/02/17  Yes Nita Sells, MD  carvedilol (COREG) 6.25 MG tablet Take 1 tablet (6.25 mg total) by mouth 2 (two) times daily with a meal. 07/01/17  Yes Nita Sells, MD  ferrous gluconate (FERGON) 324 MG tablet Take 1 tablet (324 mg total) by mouth daily with breakfast. Patient taking differently: Take 324 mg by mouth daily  with breakfast. (0800) 03/16/17  Yes Mack Hook, MD  folic acid (FOLVITE) 1 MG tablet Take 1 tablet (1 mg total) by mouth daily. Patient taking differently: Take 1 mg by mouth daily. (0800) 03/16/17  Yes Mack Hook, MD  metFORMIN (GLUCOPHAGE) 500 MG tablet 1/2 tab by mouth twice daily Patient taking differently: Take 250 mg by mouth 2 (two) times daily. (0800 & 2000) 05/04/17  Yes Mack Hook, MD  ofloxacin (OCUFLOX) 0.3 % ophthalmic solution Place 1 drop into the left eye 4 (four) times daily. 07/01/17  Yes Nita Sells, MD    prednisoLONE acetate (PRED FORTE) 1 % ophthalmic suspension Place 1 drop into the left eye 4 (four) times daily. 07/01/17  Yes Nita Sells, MD  thiamine 100 MG tablet Take 1 tablet (100 mg total) by mouth daily. Patient taking differently: Take 100 mg by mouth daily. (0800) 03/16/17  Yes Mack Hook, MD  Multiple Vitamin (MULTIVITAMIN WITH MINERALS) TABS tablet Take 1 tablet by mouth daily. 11/16/13   [name], PA-C    Family History No family history on file.  Social History Social History   Tobacco Use  . Smoking status: Former Smoker    Types: Cigarettes    Last attempt to quit: 05/19/1990    Years since quitting: 27.1  . Smokeless tobacco: Never Used  Substance Use Topics  . 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 Drug use: No     Allergies   Patient has no known allergies.   Review of Systems Review of Systems  Constitutional: Positive for chills.  Respiratory: Positive for cough.   Gastrointestinal: Positive for vomiting.  All other systems reviewed and are negative.    Physical Exam Updated Vital Signs BP (!) 169/89 (BP Location: Left Arm)   Pulse 64   Temp 98.6 F (37 C) (Rectal)   Resp 20   Ht 5\' 5"  (1.651 m)   Wt 54.4 kg (120 lb)   SpO2 98%   BMI 19.97 kg/m   Physical Exam  Constitutional:  Chronically ill, slightly dehydrated   HENT:  Head: Normocephalic.  MM slightly dry   Eyes: Conjunctivae and EOM are normal. Pupils are equal, round, and reactive to light.  Neck: Normal range of motion. Neck supple.  Cardiovascular: Normal rate, regular rhythm and normal heart sounds.  Pulmonary/Chest: Effort normal and breath sounds normal. No stridor. No respiratory distress. He has no wheezes.  Abdominal: Soft. Bowel sounds are normal. He exhibits no distension. There is no tenderness. There is no guarding.  Musculoskeletal: Normal range of motion.  Neurological:  He is alert.  Alert. Moving all extremities   Skin: Skin is warm.  Psychiatric: He has a normal mood and affect.  Nursing note and vitals reviewed.    ED Treatments / Results  Labs (all labs ordered are listed, but only abnormal results are displayed) Labs Reviewed  CBC WITH DIFFERENTIAL/PLATELET - Abnormal; Notable for the following components:      Result Value   WBC 14.7 (*)    RBC 4.13 (*)    Hemoglobin 10.2 (*)    HCT 31.2 (*)    MCV 75.5 (*)    MCH 24.7 (*)    Neutro Abs 11.6 (*)    All other components within normal limits  COMPREHENSIVE METABOLIC PANEL - Abnormal; Notable for the following components:   CO2 20 (*)    Glucose, Bld 162 (*)  BUN 44 (*)    Creatinine, Ser 2.21 (*)    Calcium 8.5 (*)    Albumin 2.7 (*)    ALT 8 (*)    GFR calc non Af Amer 27 (*)    GFR calc Af Amer 31 (*)    All other components within normal limits  INFLUENZA PANEL BY PCR (TYPE A & B) - Abnormal; Notable for the following components:   Influenza A By PCR POSITIVE (*)    All other components within normal limits  LIPASE, BLOOD - Abnormal; Notable for the following components:   Lipase 97 (*)    All other components within normal limits  URINALYSIS, ROUTINE W REFLEX MICROSCOPIC  I-STAT CG4 LACTIC ACID, ED  I-STAT TROPONIN, ED    EKG  EKG Interpretation  Date/Time:  Thursday July 09 2017 09:57:30 EST Ventricular Rate:  64 PR Interval:    QRS Duration: 95 QT Interval:  412 QTC Calculation: 426 R Axis:   83 Text Interpretation:  Sinus rhythm Left ventricular hypertrophy No significant change since last tracing Confirmed by Wandra Arthurs [phone]) on 07/09/2017 10:27:25 AM       Radiology Ct Chest Wo Contrast  Result Date: 07/09/2017 CLINICAL DATA:  77 year old male with flu like symptoms for 2 days. Flank pain. Vomiting. Abnormal lung opacity on chest radiograph earlier today. EXAM: CT CHEST, ABDOMEN AND PELVIS WITHOUT CONTRAST TECHNIQUE: Multidetector CT imaging of the  chest, abdomen and pelvis was performed following the standard protocol without IV contrast. COMPARISON:  Chest and abdominal radiographs 1052 hr today. Cervical spine CT 11/15/2013. FINDINGS: CT CHEST FINDINGS Cardiovascular: Calcified coronary artery atherosclerosis and/or stents. Comparatively minimal calcified thoracic aortic atherosclerosis. Vascular patency is not evaluated in the absence of IV contrast. Mild cardiomegaly. No pericardial effusion. Mediastinum/Nodes: No mediastinal lymphadenopathy. Lungs/Pleura: Trace layering left pleural effusion. The central airways are patent and appear normal, but there is moderate to severe bronchiectasis throughout the lingula, the left lower lobe, in the right middle lobe (medial segment most affected), and in the inferior segment of the right upper lobe. There is is borderline to mild bronchiectasis elsewhere. Superimposed widespread peribronchial thickening and nodular/irregular opacity throughout the lingula and left lower lobe. Less pronounced peribronchial opacity in the right middle lobe. However, there is nodular, peribronchial and peripheral opacity in the right upper lobe with both ground-glass and solid features. Minimal distal peribronchial opacity in the posterior left upper lobe. No right lower lobe involvement. No consolidation. Musculoskeletal: No acute osseous abnormality identified. Chronic posttraumatic or degenerative changes to thoracic spinous processes T6 through T8. Small benign bone island in the T4 vertebral body. Chronic right anterolateral 4th and 5th rib fractures. CT ABDOMEN PELVIS FINDINGS Hepatobiliary: Diminutive gallbladder.  Negative noncontrast liver. Pancreas: Negative. Spleen: Negative. Adrenals/Urinary Tract: Normal adrenal glands. Normal noncontrast appearance of both kidneys. No nephrolithiasis or hydronephrosis. Decompressed proximal ureters. Unremarkable urinary bladder. Stomach/Bowel: Stool ball in the rectum. Mild retained  stool in the descending and sigmoid colon. Mildly redundant but otherwise negative transverse colon. Mild motion artifact in the right abdomen. No right colon inflammation identified. Normal appendix. No dilated small bowel. Negative stomach and duodenum. No abdominal free air or free fluid. Vascular/Lymphatic: Mild Calcified aortic atherosclerosis. But advanced pelvic and proximal femoral artery calcified atherosclerosis. There is also somewhat advanced calcified atherosclerosis of the SMA. Vascular patency is not evaluated in the absence of IV contrast. No lymphadenopathy. There is an oval dense calcification in the distal small bowel mesentery which might be a chronic  postinflammatory node (series 2, image 93). Reproductive: Negative. Other: No pelvic free fluid. Musculoskeletal: No acute osseous abnormality identified. 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 IMPRESSION: 1. 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 2. Patchy and nodular right greater than left upper lobe opacity compatible with acute distal airway infection. Superimposed more pronounced lingula and left lower lobe opacity probably represents a combination of chronic and recurrent peribronchial infection in the setting of bronchiectasis, such as MAI. 3. Trace left pleural effusion. 4. No acute or inflammatory process identified in the noncontrast abdomen or pelvis; consider fecal impaction in light of rectal stool ball. 5. Mild for age calcified aortic atherosclerosis, but advanced pelvic, proximal femoral, and coronary artery calcified atherosclerosis. 6. Mild cardiomegaly. Electronically Signed   By: Genevie Ann M.D.   On: 07/09/2017 12:20   Dg Abd Acute W/chest  Result Date: 07/09/2017 CLINICAL DATA:  Vomiting, cough EXAM: DG ABDOMEN ACUTE W/ 1V CHEST COMPARISON:  None. FINDINGS: Mild cardiomegaly. Airspace opacity noted in the left lower lobe compatible with pneumonia. Possible  nodules in the right lung. No effusions. Nonobstructive bowel gas pattern. No free air organomegaly. No suspicious calcification. IMPRESSION: Left lower lobe consolidation compatible with pneumonia. Questionable nodules in the right lung. These could be further evaluated with chest CT. No evidence of bowel obstruction or free air in the abdomen. Electronically Signed   By: Rolm Baptise M.D.   On: 07/09/2017 11:08   Ct Renal Timothy Study  Result Date: 07/09/2017 CLINICAL DATA:  77 year old male with flu like symptoms for 2 days. Flank pain. Vomiting. Abnormal lung opacity on chest radiograph earlier today. EXAM: CT CHEST, ABDOMEN AND PELVIS WITHOUT CONTRAST TECHNIQUE: Multidetector CT imaging of the chest, abdomen and pelvis was performed following the standard protocol without IV contrast. COMPARISON:  Chest and abdominal radiographs 1052 hr today. Cervical spine CT 11/15/2013. FINDINGS: CT CHEST FINDINGS Cardiovascular: Calcified coronary artery atherosclerosis and/or stents. Comparatively minimal calcified thoracic aortic atherosclerosis. Vascular patency is not evaluated in the absence of IV contrast. Mild cardiomegaly. No pericardial effusion. Mediastinum/Nodes: No mediastinal lymphadenopathy. Lungs/Pleura: Trace layering left pleural effusion. The central airways are patent and appear normal, but there is moderate to severe bronchiectasis throughout the lingula, the left lower lobe, in the right middle lobe (medial segment most affected), and in the inferior segment of the right upper lobe. There is is borderline to mild bronchiectasis elsewhere. Superimposed widespread peribronchial thickening and nodular/irregular opacity throughout the lingula and left lower lobe. Less pronounced peribronchial opacity in the right middle lobe. However, there is nodular, peribronchial and peripheral opacity in the right upper lobe with both ground-glass and solid features. Minimal distal peribronchial opacity in the  posterior left upper lobe. No right lower lobe involvement. No consolidation. Musculoskeletal: No acute osseous abnormality identified. Chronic posttraumatic or degenerative changes to thoracic spinous processes T6 through T8. Small benign bone island in the T4 vertebral body. Chronic right anterolateral 4th and 5th rib fractures. CT ABDOMEN PELVIS FINDINGS Hepatobiliary: Diminutive gallbladder.  Negative noncontrast liver. Pancreas: Negative. Spleen: Negative. Adrenals/Urinary Tract: Normal adrenal glands. Normal noncontrast appearance of both kidneys. No nephrolithiasis or hydronephrosis. Decompressed proximal ureters. Unremarkable urinary bladder. Stomach/Bowel: Stool ball in the rectum. Mild retained stool in the descending and sigmoid colon. Mildly redundant but otherwise negative transverse colon. Mild motion artifact in the right abdomen. No right colon inflammation identified. Normal appendix. No dilated small bowel. Negative stomach and duodenum. No abdominal free air or free fluid. Vascular/Lymphatic:  Mild Calcified aortic atherosclerosis. But advanced pelvic and proximal femoral artery calcified atherosclerosis. There is also somewhat advanced calcified atherosclerosis of the SMA. Vascular patency is not evaluated in the absence of IV contrast. No lymphadenopathy. There is an oval dense calcification in the distal small bowel mesentery which might be a chronic postinflammatory node (series 2, image 93). Reproductive: Negative. Other: No pelvic free fluid. Musculoskeletal: No acute osseous abnormality identified. 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 IMPRESSION: 1. 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 2. Patchy and nodular right greater than left upper lobe opacity compatible with acute distal airway infection. Superimposed more pronounced lingula and left lower lobe opacity probably represents a combination of chronic and recurrent  peribronchial infection in the setting of bronchiectasis, such as MAI. 3. Trace left pleural effusion. 4. No acute or inflammatory process identified in the noncontrast abdomen or pelvis; consider fecal impaction in light of rectal stool ball. 5. Mild for age calcified aortic atherosclerosis, but advanced pelvic, proximal femoral, and coronary artery calcified atherosclerosis. 6. Mild cardiomegaly. Electronically Signed   By: Genevie Ann M.D.   On: 07/09/2017 12:20    Procedures Procedures (including critical care time)  Medications Ordered in ED Medications  oseltamivir (TAMIFLU) capsule 75 mg (not administered)  sodium chloride 0.9 % bolus 1,000 mL (1,000 mLs Intravenous New Bag/Given 07/09/17 1010)  ondansetron (ZOFRAN) injection 4 mg (4 mg Intravenous Given 07/09/17 1023)     Initial Impression / Assessment and Plan / ED Course  I have reviewed the triage vital signs and the nursing notes.  Pertinent labs & imaging results that were available during my care of the patient were reviewed by me and considered in my medical decision making (see chart for details).     Timothy [name] is a 77 y.o. male here with cough, flu syndrome, vomiting. Consider flu syndrome vs gastro. Patient was recently admitted for uncontrolled hypertension so will check labs and EKG. Will hydrate and reassess.   1:25 PM Flu positive. WBC 15. CXR showed possible pneumonia. Lipase elevated. CT chest/ab/pel showed multi focal pneumonia. I wonder if he had post flu pneumonia. Given recent admission, will order vanc/cefepime. Ordered tamiflu. Patient not hypotensive or hypoxic.   Final Clinical Impressions(s) / ED Diagnoses   Final diagnoses:  None    ED Discharge Orders    None       Drenda Freeze, MD 07/09/17 1328

## 2017-07-09 NOTE — ED Notes (Signed)
Bed: WA17 Expected date:  Expected time:  Means of arrival:  Comments: EMS-vomiting

## 2017-07-09 NOTE — Progress Notes (Signed)
A consult was received from an ED physician for vancomycin and cefepime per pharmacy dosing.  The patient's profile has been reviewed for ht/wt/allergies/indication/available labs.   A one time order has been placed for vancomycin 1g and cefepime 2g.  Further antibiotics/pharmacy consults should be ordered by admitting physician if indicated.                       Thank you, Peggyann Juba, PharmD, BCPS Pager: [phone] 07/09/2017  1:29 PM

## 2017-07-09 NOTE — ED Triage Notes (Signed)
Per EMS- patient is from [place] [facility]. Patient has had flu symptoms x 2 days. Patient began vomiting upon arrival to the ED. Patient is [place] and speaks very little [place].

## 2017-07-09 NOTE — Telephone Encounter (Signed)
Closed Encounter  °

## 2017-07-09 NOTE — Progress Notes (Signed)
Called for report; RN was [place] another Pt to ICU

## 2017-07-09 NOTE — H&P (Signed)
History and Physical    Timothy [name]:[id number] DOB: 01/26/1941 DOA: 07/09/2017  PCP: Patient, No Pcp Per  Patient coming from: Snf  Chief Complaint: Fevers chills  HPI: Timothy [name] is a 77 y.o. male with medical history significant of diabetes, hypertension, dementia comes in from skilled nursing facility for fevers and chills for 2 days.  Patient cannot provide any history due to his dementia.  He can answer yes or no questions.  Per report he has been having chills and fevers for about 48 hours sent in for possible influenza.  Patient has the flu and also has bilateral multifocal infiltrates.  He is afebrile here vital signs are stable with normal O2 sats.  Patient was just hospitalized a couple weeks ago for uncontrolled hypertension.  Patient is being referred for admission for healthcare associated pneumonia and influenza.  Review of Systems: Unobtainable due to dementia  Past Medical History:  Diagnosis Date  . Alcohol abuse    Patient denies, but was shared with people from [facility] by his male roommate.  . Bilateral cataracts 04/08/2017  . DM (diabetes mellitus), type 2 with renal complications ([place])    uncertain when diagnosed  . HOH (hard of hearing)   . Hypertension   . Legally blind    B/L  . Poor vision     Past Surgical History:  Procedure Laterality Date  . PARS PLANA VITRECTOMY Left 06/29/2017   Procedure: PARS PLANA VITRECTOMY WITH 25 GAUGE AND MEMBRANE PEEL WITH GAS EXCHANGE, AIR SILICONE OIL AND PHACO COAGULATION, LASER;  Surgeon: Jalene Mullet, MD;  Location: [place];  Service: Ophthalmology;  Laterality: Left;     reports that he quit smoking about 27 years ago. His smoking use included cigarettes. he has never used smokeless tobacco. He reports that he drinks alcohol. He reports that he does not use drugs.  No Known Allergies  No family history on file.  Unknown due to her dementia  Prior to Admission medications   Medication Sig Start Date End Date Taking?  Authorizing Provider  amLODipine (NORVASC) 10 MG tablet Take 1 tablet (10 mg total) by mouth daily. 07/02/17  Yes Nita Sells, MD  aspirin 81 MG chewable tablet Chew 1 tablet (81 mg total) by mouth daily. 07/02/17  Yes Nita Sells, MD  carvedilol (COREG) 6.25 MG tablet Take 1 tablet (6.25 mg total) by mouth 2 (two) times daily with a meal. 07/01/17  Yes Nita Sells, MD  ferrous gluconate (FERGON) 324 MG tablet Take 1 tablet (324 mg total) by mouth daily with breakfast. Patient taking differently: Take 324 mg by mouth daily with breakfast. (0800) 03/16/17  Yes Mack Hook, MD  folic acid (FOLVITE) 1 MG tablet Take 1 tablet (1 mg total) by mouth daily. Patient taking differently: Take 1 mg by mouth daily. (0800) 03/16/17  Yes Mack Hook, MD  metFORMIN (GLUCOPHAGE) 500 MG tablet 1/2 tab by mouth twice daily Patient taking differently: Take 250 mg by mouth 2 (two) times daily. (0800 & 2000) 05/04/17  Yes Mack Hook, MD  ofloxacin (OCUFLOX) 0.3 % ophthalmic solution Place 1 drop into the left eye 4 (four) times daily. 07/01/17  Yes Nita Sells, MD  prednisoLONE acetate (PRED FORTE) 1 % ophthalmic suspension Place 1 drop into the left eye 4 (four) times daily. 07/01/17  Yes Nita Sells, MD  thiamine 100 MG tablet Take 1 tablet (100 mg total) by mouth daily. Patient taking differently: Take 100 mg by mouth daily. (0800) 03/16/17  Yes Mack Hook,  MD  Multiple Vitamin (MULTIVITAMIN WITH MINERALS) TABS tablet Take 1 tablet by mouth daily. 11/16/13   [name], PA-C    Physical Exam: Vitals:   07/09/17 0914 07/09/17 0916 07/09/17 0952  BP: (!) 169/89    Pulse: 64    Resp: 20    Temp: (!) 97.4 F (36.3 C)  98.6 F (37 C)  TempSrc: Oral  Rectal  SpO2: 100% 98%   Weight: 54.4 kg (120 lb)    Height: 5\' 5"  (1.651 m)        Constitutional: NAD, calm, comfortable and pleasant Vitals:   07/09/17 0914 07/09/17 0916 07/09/17  0952  BP: (!) 169/89    Pulse: 64    Resp: 20    Temp: (!) 97.4 F (36.3 C)  98.6 F (37 C)  TempSrc: Oral  Rectal  SpO2: 100% 98%   Weight: 54.4 kg (120 lb)    Height: 5\' 5"  (1.651 m)     Eyes: PERRL, lids and conjunctivae normal ENMT: Mucous membranes are moist. Posterior pharynx clear of any exudate or lesions.Normal dentition.  Neck: normal, supple, no masses, no thyromegaly Respiratory: clear to auscultation bilaterally, no wheezing, no crackles. Normal respiratory effort. No accessory muscle use.  Cardiovascular: Regular rate and rhythm, no murmurs / rubs / gallops. No extremity edema. 2+ pedal pulses. No carotid bruits.  Abdomen: no tenderness, no masses palpated. No hepatosplenomegaly. Bowel sounds positive.  Musculoskeletal: no clubbing / cyanosis. No joint deformity upper and lower extremities. Good ROM, no contractures. Normal muscle tone.  Skin: no rashes, lesions, ulcers. No induration Neurologic: CN 2-12 grossly intact. Sensation intact, DTR normal. Strength 5/5 in all 4.  Psychiatric: . Alert and oriented x 1. Normal mood.    Labs on Admission: I have personally reviewed following labs and imaging studies  CBC: Recent Labs  Lab 07/09/17 1002  WBC 14.7*  NEUTROABS 11.6*  HGB 10.2*  HCT 31.2*  MCV 75.5*  PLT 376   Basic Metabolic Panel: Recent Labs  Lab 07/09/17 1002  NA 136  K 4.3  CL 106  CO2 20*  GLUCOSE 162*  BUN 44*  CREATININE 2.21*  CALCIUM 8.5*   GFR: Estimated Creatinine Clearance: 21.2 mL/min (A) (by C-G formula based on SCr of 2.21 mg/dL (H)). Liver Function Tests: Recent Labs  Lab 07/09/17 1002  AST 26  ALT 8*  ALKPHOS 71  BILITOT 0.6  PROT 7.1  ALBUMIN 2.7*   Recent Labs  Lab 07/09/17 1002  LIPASE 97*   No results for input(s): AMMONIA in the last 168 hours. Coagulation Profile: No results for input(s): INR, PROTIME in the last 168 hours. Cardiac Enzymes: No results for input(s): CKTOTAL, CKMB, CKMBINDEX, TROPONINI in  the last 168 hours. BNP (last 3 results) No results for input(s): PROBNP in the last 8760 hours. HbA1C: No results for input(s): HGBA1C in the last 72 hours. CBG: No results for input(s): GLUCAP in the last 168 hours. Lipid Profile: No results for input(s): CHOL, HDL, LDLCALC, TRIG, CHOLHDL, LDLDIRECT in the last 72 hours. Thyroid Function Tests: No results for input(s): TSH, T4TOTAL, FREET4, T3FREE, THYROIDAB in the last 72 hours. Anemia Panel: No results for input(s): VITAMINB12, FOLATE, FERRITIN, TIBC, IRON, RETICCTPCT in the last 72 hours. Urine analysis:    Component Value Date/Time   COLORURINE YELLOW 11/15/2013 0231   APPEARANCEUR CLEAR 11/15/2013 0231   LABSPEC 1.011 11/15/2013 0231   PHURINE 5.5 11/15/2013 0231   GLUCOSEU >1000 (A) 11/15/2013 0231   HGBUR MODERATE (  A) 11/15/2013 0231   BILIRUBINUR NEGATIVE 11/15/2013 0231   KETONESUR NEGATIVE 11/15/2013 0231   PROTEINUR 30 (A) 11/15/2013 0231   UROBILINOGEN 0.2 11/15/2013 0231   NITRITE NEGATIVE 11/15/2013 0231   LEUKOCYTESUR TRACE (A) 11/15/2013 0231   Sepsis Labs: !!!!!!!!!!!!!!!!!!!!!!!!!!!!!!!!!!!!!!!!!!!! @LABRCNTIP (procalcitonin:4,lacticidven:4) )No results found for this or any previous visit (from the past 240 hour(s)).   Radiological Exams on Admission: Ct Chest Wo Contrast  Result Date: 07/09/2017 CLINICAL DATA:  77 year old male with flu like symptoms for 2 days. Flank pain. Vomiting. Abnormal lung opacity on chest radiograph earlier today. EXAM: CT CHEST, ABDOMEN AND PELVIS WITHOUT CONTRAST TECHNIQUE: Multidetector CT imaging of the chest, abdomen and pelvis was performed following the standard protocol without IV contrast. COMPARISON:  Chest and abdominal radiographs 1052 hr today. Cervical spine CT 11/15/2013. FINDINGS: CT CHEST FINDINGS Cardiovascular: Calcified coronary artery atherosclerosis and/or stents. Comparatively minimal calcified thoracic aortic atherosclerosis. Vascular patency is not evaluated  in the absence of IV contrast. Mild cardiomegaly. No pericardial effusion. Mediastinum/Nodes: No mediastinal lymphadenopathy. Lungs/Pleura: Trace layering left pleural effusion. The central airways are patent and appear normal, but there is moderate to severe bronchiectasis throughout the lingula, the left lower lobe, in the right middle lobe (medial segment most affected), and in the inferior segment of the right upper lobe. There is is borderline to mild bronchiectasis elsewhere. Superimposed widespread peribronchial thickening and nodular/irregular opacity throughout the lingula and left lower lobe. Less pronounced peribronchial opacity in the right middle lobe. However, there is nodular, peribronchial and peripheral opacity in the right upper lobe with both ground-glass and solid features. Minimal distal peribronchial opacity in the posterior left upper lobe. No right lower lobe involvement. No consolidation. Musculoskeletal: No acute osseous abnormality identified. Chronic posttraumatic or degenerative changes to thoracic spinous processes T6 through T8. Small benign bone island in the T4 vertebral body. Chronic right anterolateral 4th and 5th rib fractures. CT ABDOMEN PELVIS FINDINGS Hepatobiliary: Diminutive gallbladder.  Negative noncontrast liver. Pancreas: Negative. Spleen: Negative. Adrenals/Urinary Tract: Normal adrenal glands. Normal noncontrast appearance of both kidneys. No nephrolithiasis or hydronephrosis. Decompressed proximal ureters. Unremarkable urinary bladder. Stomach/Bowel: Stool ball in the rectum. Mild retained stool in the descending and sigmoid colon. Mildly redundant but otherwise negative transverse colon. Mild motion artifact in the right abdomen. No right colon inflammation identified. Normal appendix. No dilated small bowel. Negative stomach and duodenum. No abdominal free air or free fluid. Vascular/Lymphatic: Mild Calcified aortic atherosclerosis. But advanced pelvic and proximal  femoral artery calcified atherosclerosis. There is also somewhat advanced calcified atherosclerosis of the SMA. Vascular patency is not evaluated in the absence of IV contrast. No lymphadenopathy. There is an oval dense calcification in the distal small bowel mesentery which might be a chronic postinflammatory node (series 2, image 93). Reproductive: Negative. Other: No pelvic free fluid. Musculoskeletal: No acute osseous abnormality identified. 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 IMPRESSION: 1. 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 2. Patchy and nodular right greater than left upper lobe opacity compatible with acute distal airway infection. Superimposed more pronounced lingula and left lower lobe opacity probably represents a combination of chronic and recurrent peribronchial infection in the setting of bronchiectasis, such as MAI. 3. Trace left pleural effusion. 4. No acute or inflammatory process identified in the noncontrast abdomen or pelvis; consider fecal impaction in light of rectal stool ball. 5. Mild for age calcified aortic atherosclerosis, but advanced pelvic, proximal femoral, and coronary artery calcified atherosclerosis. 6. Mild cardiomegaly. Electronically  Signed   By: Genevie Ann M.D.   On: 07/09/2017 12:20   Dg Abd Acute W/chest  Result Date: 07/09/2017 CLINICAL DATA:  Vomiting, cough EXAM: DG ABDOMEN ACUTE W/ 1V CHEST COMPARISON:  None. FINDINGS: Mild cardiomegaly. Airspace opacity noted in the left lower lobe compatible with pneumonia. Possible nodules in the right lung. No effusions. Nonobstructive bowel gas pattern. No free air organomegaly. No suspicious calcification. IMPRESSION: Left lower lobe consolidation compatible with pneumonia. Questionable nodules in the right lung. These could be further evaluated with chest CT. No evidence of bowel obstruction or free air in the abdomen. Electronically Signed   By: Rolm Baptise  M.D.   On: 07/09/2017 11:08   Ct Renal Stone Study  Result Date: 07/09/2017 CLINICAL DATA:  77 year old male with flu like symptoms for 2 days. Flank pain. Vomiting. Abnormal lung opacity on chest radiograph earlier today. EXAM: CT CHEST, ABDOMEN AND PELVIS WITHOUT CONTRAST TECHNIQUE: Multidetector CT imaging of the chest, abdomen and pelvis was performed following the standard protocol without IV contrast. COMPARISON:  Chest and abdominal radiographs 1052 hr today. Cervical spine CT 11/15/2013. FINDINGS: CT CHEST FINDINGS Cardiovascular: Calcified coronary artery atherosclerosis and/or stents. Comparatively minimal calcified thoracic aortic atherosclerosis. Vascular patency is not evaluated in the absence of IV contrast. Mild cardiomegaly. No pericardial effusion. Mediastinum/Nodes: No mediastinal lymphadenopathy. Lungs/Pleura: Trace layering left pleural effusion. The central airways are patent and appear normal, but there is moderate to severe bronchiectasis throughout the lingula, the left lower lobe, in the right middle lobe (medial segment most affected), and in the inferior segment of the right upper lobe. There is is borderline to mild bronchiectasis elsewhere. Superimposed widespread peribronchial thickening and nodular/irregular opacity throughout the lingula and left lower lobe. Less pronounced peribronchial opacity in the right middle lobe. However, there is nodular, peribronchial and peripheral opacity in the right upper lobe with both ground-glass and solid features. Minimal distal peribronchial opacity in the posterior left upper lobe. No right lower lobe involvement. No consolidation. Musculoskeletal: No acute osseous abnormality identified. Chronic posttraumatic or degenerative changes to thoracic spinous processes T6 through T8. Small benign bone island in the T4 vertebral body. Chronic right anterolateral 4th and 5th rib fractures. CT ABDOMEN PELVIS FINDINGS Hepatobiliary: Diminutive  gallbladder.  Negative noncontrast liver. Pancreas: Negative. Spleen: Negative. Adrenals/Urinary Tract: Normal adrenal glands. Normal noncontrast appearance of both kidneys. No nephrolithiasis or hydronephrosis. Decompressed proximal ureters. Unremarkable urinary bladder. Stomach/Bowel: Stool ball in the rectum. Mild retained stool in the descending and sigmoid colon. Mildly redundant but otherwise negative transverse colon. Mild motion artifact in the right abdomen. No right colon inflammation identified. Normal appendix. No dilated small bowel. Negative stomach and duodenum. No abdominal free air or free fluid. Vascular/Lymphatic: Mild Calcified aortic atherosclerosis. But advanced pelvic and proximal femoral artery calcified atherosclerosis. There is also somewhat advanced calcified atherosclerosis of the SMA. Vascular patency is not evaluated in the absence of IV contrast. No lymphadenopathy. There is an oval dense calcification in the distal small bowel mesentery which might be a chronic postinflammatory node (series 2, image 93). Reproductive: Negative. Other: No pelvic free fluid. Musculoskeletal: No acute osseous abnormality identified. 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 IMPRESSION: 1. 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 2. Patchy and nodular right greater than left upper lobe opacity compatible with acute distal airway infection. Superimposed more pronounced lingula and left lower lobe opacity probably represents a combination of chronic and recurrent peribronchial infection in the setting  of bronchiectasis, such as MAI. 3. Trace left pleural effusion. 4. No acute or inflammatory process identified in the noncontrast abdomen or pelvis; consider fecal impaction in light of rectal stool ball. 5. Mild for age calcified aortic atherosclerosis, but advanced pelvic, proximal femoral, and coronary artery calcified atherosclerosis. 6. Mild  cardiomegaly. Electronically Signed   By: Genevie Ann M.D.   On: 07/09/2017 12:20    Old chart reviewed Case discussed with EDP  Assessment/Plan 77 year old male with H CAP and influenza Principal Problem:   Influenza-with superimposed bacterial infection.  Patient placed on Tamiflu, vancomycin, cefepime.  Lactic acid level normal.  Placed on precautions.  Active Problems:   PNA (pneumonia)-IV vancomycin and cefepime.  Obtain blood and sputum cultures.  Oxygen sats are normal.  Vitals are normal.    Closed TBI (traumatic brain injury) (HCC)-noted    Chronic kidney disease-baseline creatinine around 1.7 and is bumped up to 2.2 will place on IV fluids overnight    Anemia in chronic kidney disease-stable    Hypertension-continue his Norvasc and Coreg    DVT prophylaxis: Lovenox Code Status: Full code per nursing home records Family Communication: None Disposition Plan: Per day team Consults called: None Admission status: Admission   Adin Lariccia A MD [facility]  If 7PM-7AM, please contact night-coverage [email or web address] Password [facility]  07/09/2017, 1:36 PM

## 2017-07-10 DIAGNOSIS — J111 Influenza due to unidentified influenza virus with other respiratory manifestations: Secondary | ICD-10-CM

## 2017-07-10 LAB — CBC WITH DIFFERENTIAL/PLATELET
BASOS ABS: 0 10*3/uL (ref 0.0–0.1)
Basophils Relative: 0 %
Eosinophils Absolute: 0.1 10*3/uL (ref 0.0–0.7)
Eosinophils Relative: 1 %
HEMATOCRIT: 29.7 % — AB (ref 39.0–52.0)
HEMOGLOBIN: 9.6 g/dL — AB (ref 13.0–17.0)
LYMPHS PCT: 27 %
Lymphs Abs: 2.4 10*3/uL (ref 0.7–4.0)
MCH: 23.9 pg — ABNORMAL LOW (ref 26.0–34.0)
MCHC: 32.3 g/dL (ref 30.0–36.0)
MCV: 73.9 fL — ABNORMAL LOW (ref 78.0–100.0)
MONOS PCT: 11 %
Monocytes Absolute: 1 10*3/uL (ref 0.1–1.0)
NEUTROS ABS: 5.4 10*3/uL (ref 1.7–7.7)
NEUTROS PCT: 61 %
Platelets: 304 10*3/uL (ref 150–400)
RBC: 4.02 MIL/uL — AB (ref 4.22–5.81)
RDW: 14 % (ref 11.5–15.5)
WBC: 8.9 10*3/uL (ref 4.0–10.5)

## 2017-07-10 LAB — BASIC METABOLIC PANEL
ANION GAP: 11 (ref 5–15)
BUN: 36 mg/dL — ABNORMAL HIGH (ref 6–20)
CO2: 19 mmol/L — ABNORMAL LOW (ref 22–32)
Calcium: 8.2 mg/dL — ABNORMAL LOW (ref 8.9–10.3)
Chloride: 111 mmol/L (ref 101–111)
Creatinine, Ser: 1.84 mg/dL — ABNORMAL HIGH (ref 0.61–1.24)
GFR calc Af Amer: 39 mL/min — ABNORMAL LOW (ref >60)
GFR, EST NON AFRICAN AMERICAN: 33 mL/min — AB (ref >60)
GLUCOSE: 121 mg/dL — AB (ref 65–99)
POTASSIUM: 4 mmol/L (ref 3.5–5.1)
Sodium: 141 mmol/L (ref 135–145)

## 2017-07-10 MED ORDER — HYDRALAZINE HCL 20 MG/ML IJ SOLN: 10.0000 mg | Freq: Four times a day (QID) | INTRAMUSCULAR | Status: DC | PRN

## 2017-07-10 NOTE — Clinical Social Work Note (Signed)
Clinical Social Work Assessment  Patient Details  Name: Timothy [name] MRN: [id number] Date of Birth: 10-18-1940  Date of referral:  07/10/17               Reason for consult:  Discharge Planning                Permission sought to share information with:  Family Supports Permission granted to share information::  Yes, Verbal Permission Granted  Name::     [name] [phone]) and [name] [phone]) and niece [name] [phone])  Agency::  [facility]  Relationship::  community resources,   Sport and exercise psychologist Information:  number listed above  Housing/Transportation Living arrangements for the past 2 months:  [place] of Information:  Friend/Neighbor(community support) Patient Interpreter Needed:  Other (Comment Required)(pt speaks Company secretary) Criminal Activity/Legal Involvement Pertinent to Current Situation/Hospitalization:  No - Comment as needed Significant Relationships:  Other Family Members, Community Support Lives with:  Facility Resident Do you feel safe going back to the place where you live?  Yes Need for family participation in patient care:  Yes (Comment)  Care giving concerns:  No family at bedside. CSW unable to do assessment with patient as he is only cognitive to self . Patient will need a translator for any conversation as patient speak [place] and [place]. Patient community supporter [name] and Vung are able to speak the language if unable to find interpreter   Social Worker assessment / plan:  CSW spoke with community supporter [name] [place] her contact information is listed in patients facesheet. [name] stated she was able to find a family member for the patient and CSW will place contact information on chart. Patients niece is "[employer] [phone]". Per [name] if hospital is needing medical decisions to be made and patient is unable to do it, to contact niece but to also keep Liana updated. Patient is from [place]. Prior to  discharge please contact Liana to make her aware of patients discharge back to facility  Employment status:  Retired Insurance underwriter information:  Managed Medicare PT Recommendations:  Not assessed at this time Information / Referral to community resources:  Other (Comment Required)  Patient/Family's Response to care:  Liana and the other community supporters are very supportive of patient and his needs. [place] visits patient on a regular basis at [employer]   Patient/Family's Understanding of and Emotional Response to Diagnosis, Current Treatment, and Prognosis:  Community supporters very educated on patients needs   Emotional Assessment Appearance:  Appears stated age Attitude/Demeanor/Rapport:  Unable to Assess Affect (typically observed):  Unable to Assess Orientation:  Oriented to Self Alcohol / Substance use:  Not Applicable Psych involvement (Current and /or in the community):  No (Comment)  Discharge Needs  Concerns to be addressed:  No discharge needs identified Readmission within the last 30 days:  Yes Current discharge risk:  None Barriers to Discharge:  No Barriers Identified   [name], LCSW 07/10/2017, 11:27 AM

## 2017-07-10 NOTE — Progress Notes (Signed)
Patient Demographics:    Timothy [name], is a 77 y.o. male, DOB - [birth year], JOA:[id number]  Admit date - 07/09/2017   Admitting Physician Phillips Grout, MD  Outpatient Primary MD for the patient is Patient, No Pcp Per  LOS - 1   Chief Complaint  Patient presents with  . flu symptoms  . Emesis        Subjective:    Timothy [name] today has no fevers, no emesis,  No chest pain, cough and shortness of breath is better  Assessment  & Plan :    Principal Problem:   Flu syndrome Active Problems:   Closed TBI (traumatic brain injury) ([place])   Chronic kidney disease   Anemia in chronic kidney disease   Hypertension   PNA (pneumonia)  Brief summary Timothy [name] is a 77 y.o. male with medical history significant of diabetes, hypertension, dementia admitted on 07/09/2017 skilled nursing facility for influenza A and possible pneumonia with AKI   Plan:- 1) influenza A-treat empirically with Tamiflu and supportive care along with bronchodilators  2)Possible HCAP-vancomycin and cefepime as ordered, bronchodilators and mucolytics as well  3) chronic anemia of CKD-monitor H&H closely  4)HTN-continue carvedilol 25 mg twice daily and amlodipine 10 mg daily, may use as needed hydralazine  5) prior traumatic brain injury and vision loss-supportive care, no significant behavioral problems noted  6) 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 amLODipine  10 mg Oral Daily  . aspirin  81 mg Oral Daily  . carvedilol  6.25 mg Oral BID WC  . enoxaparin (LOVENOX) injection  30 mg Subcutaneous Q24H  . multivitamin with minerals  1 tablet Oral Daily  . ofloxacin  1 drop Left Eye  QID  . oseltamivir  30 mg Oral Daily  . prednisoLONE acetate  1 drop Left Eye QID   Continuous Infusions: . ceFEPime (MAXIPIME) IV Stopped (07/10/17 1728)  . [START ON 07/11/2017] vancomycin     PRN Meds:.    Anti-infectives (From admission, onward)   Start     Dose/Rate Route Frequency Ordered Stop   07/11/17 1600  vancomycin (VANCOCIN) IVPB 750 mg/150 ml premix     750 mg 150 mL/hr over 60 Minutes Intravenous Every 48 hours 07/09/17 1604     07/10/17 1600  ceFEPIme (MAXIPIME) 1 g in sodium chloride 0.9 % 100 mL IVPB     1 g 200 mL/hr over 30 Minutes Intravenous Every 24 hours 07/09/17 1503 07/17/17 1559   07/09/17 1615  oseltamivir (TAMIFLU) capsule 30 mg     30 mg Oral Daily 07/09/17 1602 07/14/17 0959   07/09/17 1615  ceFEPIme (MAXIPIME) 1 g in sodium chloride 0.9 % 100 mL IVPB     1 g 200 mL/hr over 30 Minutes Intravenous STAT 07/09/17 1603 07/09/17 1901   07/09/17 1615  vancomycin (VANCOCIN) IVPB 1000 mg/200 mL premix     1,000 mg 200 mL/hr over 60 Minutes Intravenous STAT 07/09/17 1604 07/10/17 1615  07/09/17 1345  oseltamivir (TAMIFLU) capsule 75 mg  Status:  Discontinued     75 mg Oral Daily 07/09/17 1311 07/09/17 1602   07/09/17 1345  vancomycin (VANCOCIN) IVPB 1000 mg/200 mL premix  Status:  Discontinued     1,000 mg 200 mL/hr over 60 Minutes Intravenous  Once 07/09/17 1331 07/09/17 1503   07/09/17 1330  ceFEPIme (MAXIPIME) 2 g in sodium chloride 0.9 % 100 mL IVPB  Status:  Discontinued     2 g 200 mL/hr over 30 Minutes Intravenous  Once 07/09/17 1331 07/09/17 1503        Objective:   Vitals:   07/09/17 1755 07/09/17 2035 07/10/17 0353 07/10/17 1412  BP: (!) 174/62 (!) 151/64 (!) 144/65 127/70  Pulse: 67 61 65 (!) 57  Resp: 17 18 16 16   Temp: 98.5 F (36.9 C)  (!) 97.4 F (36.3 C) (!) 97.5 F (36.4 C)  TempSrc: Oral Oral Oral Oral  SpO2: 100% 100% 100%   Weight: 54.4 kg (119 lb 14.9 oz)     Height:        Wt Readings from Last 3 Encounters:    07/09/17 54.4 kg (119 lb 14.9 oz)  06/30/17 42.3 kg (93 lb 4.8 oz)  05/04/17 42.6 kg (94 lb)     Intake/Output Summary (Last 24 hours) at 07/10/2017 1758 Last data filed at 07/10/2017 1605 Gross per 24 hour  Intake 773.75 ml  Output 1050 ml  Net -276.25 ml     Physical Exam  Gen:- Awake Alert, cooperative HEENT:- [email or web address].[email or web address], No sclera icterus Eyes-legally blind Neck-Supple Neck,No JVD,.  Lungs-diminished breath sounds with scattered rhonchi CV- S1, S2 normal Abd-  +ve B.Sounds, Abd Soft, No tenderness,    Extremity/Skin:- No  edema,    Psych-affect is appropriate Neuro-no new focal deficits   Data Review:   Micro Results Recent Results (from the past 240 hour(s))  Culture, blood (routine x 2) Call MD if unable to obtain prior to antibiotics being given     Status: None (Preliminary result)   Collection Time: 07/09/17  5:13 PM  Result Value Ref Range Status   Specimen Description   Final    BLOOD LEFT HAND Performed at [facility] [address] [place], [place] [address]    Special Requests   Final    BOTTLES DRAWN AEROBIC ONLY Blood Culture adequate volume Performed at [facility] [address], [place], [place] [address]    Culture   Final    NO GROWTH < 24 HOURS Performed at [facility] [address], [place], [place] [address]    Report Status PENDING  Incomplete  Culture, blood (routine x 2) Call MD if unable to obtain prior to antibiotics being given     Status: None (Preliminary result)   Collection Time: 07/09/17  5:22 PM  Result Value Ref Range Status   Specimen Description   Final    BLOOD RIGHT HAND Performed at [facility] [address], [place], [place] [address]    Special Requests   Final    IN PEDIATRIC BOTTLE Blood Culture adequate volume Performed at [facility] [address], [place],  [address]    Culture   Final    NO GROWTH < 24  HOURS Performed at [facility] [address], [place],  [address]    Report Status PENDING  Incomplete    Radiology Reports Ct Chest Wo Contrast  Result Date: 07/09/2017 CLINICAL DATA:  77 year old male with flu like symptoms for 2 days. Flank pain. Vomiting. Abnormal lung opacity on chest radiograph earlier today. EXAM: CT CHEST, ABDOMEN AND PELVIS WITHOUT CONTRAST TECHNIQUE: Multidetector CT imaging of the chest, abdomen and pelvis was performed following the standard protocol without IV contrast. COMPARISON:  Chest and abdominal radiographs 1052 hr today. Cervical spine CT 11/15/2013. FINDINGS: CT CHEST FINDINGS Cardiovascular: Calcified coronary artery atherosclerosis and/or stents. Comparatively minimal calcified thoracic aortic atherosclerosis. Vascular patency is not evaluated in the absence of IV contrast. Mild cardiomegaly. No pericardial effusion. Mediastinum/Nodes: No mediastinal lymphadenopathy. Lungs/Pleura: Trace layering left pleural effusion. The central airways are patent and appear normal, but there is moderate to severe bronchiectasis throughout the lingula, the left lower lobe, in the right middle lobe (medial segment most affected), and in the inferior segment of the right upper lobe. There is is borderline to mild bronchiectasis elsewhere. Superimposed widespread peribronchial thickening and nodular/irregular opacity throughout the lingula and left lower lobe. Less pronounced peribronchial opacity in the right middle lobe. However, there is nodular, peribronchial and peripheral opacity in the right upper lobe with both ground-glass and solid features. Minimal distal peribronchial opacity in the posterior left upper lobe. No right lower lobe involvement. No consolidation. Musculoskeletal: No acute osseous abnormality identified. Chronic posttraumatic or degenerative changes to thoracic spinous processes T6 through T8. Small benign bone island in the T4 vertebral body.  Chronic right anterolateral 4th and 5th rib fractures. CT ABDOMEN PELVIS FINDINGS Hepatobiliary: Diminutive gallbladder.  Negative noncontrast liver. Pancreas: Negative. Spleen: Negative. Adrenals/Urinary Tract: Normal adrenal glands. Normal noncontrast appearance of both kidneys. No nephrolithiasis or hydronephrosis. Decompressed proximal ureters. Unremarkable urinary bladder. Stomach/Bowel: Stool ball in the rectum. Mild retained stool in the descending and sigmoid colon. Mildly redundant but otherwise negative transverse colon. Mild motion artifact in the right abdomen. No right colon inflammation identified. Normal appendix. No dilated small bowel. Negative stomach and duodenum. No abdominal free air or free fluid. Vascular/Lymphatic: Mild Calcified aortic atherosclerosis. But advanced pelvic and proximal femoral artery calcified atherosclerosis. There is also somewhat advanced calcified atherosclerosis of the SMA. Vascular patency is not evaluated in the absence of IV contrast. No lymphadenopathy. There is an oval dense calcification in the distal small bowel mesentery which might be a chronic postinflammatory node (series 2, image 93). Reproductive: Negative. Other: No pelvic free fluid. Musculoskeletal: No acute osseous abnormality identified. 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 IMPRESSION: 1. 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 2. Patchy and nodular right greater than left upper lobe opacity compatible with acute distal airway infection. Superimposed more pronounced lingula and left lower lobe opacity probably represents a combination of chronic and recurrent peribronchial infection in the setting of bronchiectasis, such as MAI. 3. Trace left pleural effusion. 4. No acute or inflammatory process identified in the noncontrast abdomen or pelvis; consider fecal impaction in light of rectal stool ball. 5. Mild for age calcified aortic  atherosclerosis, but advanced pelvic, proximal femoral, and coronary artery calcified atherosclerosis. 6. Mild cardiomegaly. Electronically Signed   By: Genevie Ann M.D.   On: 07/09/2017 12:20   US Renal  Result Date: 06/25/2017 CLINICAL DATA:  Hyperkalemia for 1 day. EXAM: RENAL / URINARY TRACT ULTRASOUND COMPLETE COMPARISON:  None. FINDINGS: Right Kidney: Length: 9.9 cm.  Increased cortical echogenicity. Left Kidney: Length: 10.5 cm.  Increased cortical echogenicity. Bladder: Appears normal for degree of bladder distention. Bilateral ureteral jets were not seen. IMPRESSION:  Increased cortical echogenicity of bilateral kidneys, suggestive of intrinsic renal disease. Electronically Signed   By: Fidela Salisbury M.D.   On: 06/25/2017 22:22   Dg Abd Acute W/chest  Result Date: 07/09/2017 CLINICAL DATA:  Vomiting, cough EXAM: DG ABDOMEN ACUTE W/ 1V CHEST COMPARISON:  None. FINDINGS: Mild cardiomegaly. Airspace opacity noted in the left lower lobe compatible with pneumonia. Possible nodules in the right lung. No effusions. Nonobstructive bowel gas pattern. No free air organomegaly. No suspicious calcification. IMPRESSION: Left lower lobe consolidation compatible with pneumonia. Questionable nodules in the right lung. These could be further evaluated with chest CT. No evidence of bowel obstruction or free air in the abdomen. Electronically Signed   By: Rolm Baptise M.D.   On: 07/09/2017 11:08   Ct Renal Stone Study  Result Date: 07/09/2017 CLINICAL DATA:  77 year old male with flu like symptoms for 2 days. Flank pain. Vomiting. Abnormal lung opacity on chest radiograph earlier today. EXAM: CT CHEST, ABDOMEN AND PELVIS WITHOUT CONTRAST TECHNIQUE: Multidetector CT imaging of the chest, abdomen and pelvis was performed following the standard protocol without IV contrast. COMPARISON:  Chest and abdominal radiographs 1052 hr today. Cervical spine CT 11/15/2013. FINDINGS: CT CHEST FINDINGS Cardiovascular: Calcified  coronary artery atherosclerosis and/or stents. Comparatively minimal calcified thoracic aortic atherosclerosis. Vascular patency is not evaluated in the absence of IV contrast. Mild cardiomegaly. No pericardial effusion. Mediastinum/Nodes: No mediastinal lymphadenopathy. Lungs/Pleura: Trace layering left pleural effusion. The central airways are patent and appear normal, but there is moderate to severe bronchiectasis throughout the lingula, the left lower lobe, in the right middle lobe (medial segment most affected), and in the inferior segment of the right upper lobe. There is is borderline to mild bronchiectasis elsewhere. Superimposed widespread peribronchial thickening and nodular/irregular opacity throughout the lingula and left lower lobe. Less pronounced peribronchial opacity in the right middle lobe. However, there is nodular, peribronchial and peripheral opacity in the right upper lobe with both ground-glass and solid features. Minimal distal peribronchial opacity in the posterior left upper lobe. No right lower lobe involvement. No consolidation. Musculoskeletal: No acute osseous abnormality identified. Chronic posttraumatic or degenerative changes to thoracic spinous processes T6 through T8. Small benign bone island in the T4 vertebral body. Chronic right anterolateral 4th and 5th rib fractures. CT ABDOMEN PELVIS FINDINGS Hepatobiliary: Diminutive gallbladder.  Negative noncontrast liver. Pancreas: Negative. Spleen: Negative. Adrenals/Urinary Tract: Normal adrenal glands. Normal noncontrast appearance of both kidneys. No nephrolithiasis or hydronephrosis. Decompressed proximal ureters. Unremarkable urinary bladder. Stomach/Bowel: Stool ball in the rectum. Mild retained stool in the descending and sigmoid colon. Mildly redundant but otherwise negative transverse colon. Mild motion artifact in the right abdomen. No right colon inflammation identified. Normal appendix. No dilated small bowel. Negative  stomach and duodenum. No abdominal free air or free fluid. Vascular/Lymphatic: Mild Calcified aortic atherosclerosis. But advanced pelvic and proximal femoral artery calcified atherosclerosis. There is also somewhat advanced calcified atherosclerosis of the SMA. Vascular patency is not evaluated in the absence of IV contrast. No lymphadenopathy. There is an oval dense calcification in the distal small bowel mesentery which might be a chronic postinflammatory node (series 2, image 93). Reproductive: Negative. Other: No pelvic free fluid. Musculoskeletal: No acute osseous abnormality identified. 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 IMPRESSION: 1. 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 2. Patchy and nodular right greater than left upper lobe opacity compatible with acute distal airway infection. Superimposed more pronounced lingula and left lower lobe opacity probably  represents a combination of chronic and recurrent peribronchial infection in the setting of bronchiectasis, such as MAI. 3. Trace left pleural effusion. 4. No acute or inflammatory process identified in the noncontrast abdomen or pelvis; consider fecal impaction in light of rectal stool ball. 5. Mild for age calcified aortic atherosclerosis, but advanced pelvic, proximal femoral, and coronary artery calcified atherosclerosis. 6. Mild cardiomegaly. Electronically Signed   By: Genevie Ann M.D.   On: 07/09/2017 12:20     CBC Recent Labs  Lab 07/09/17 1002 07/10/17 0324  WBC 14.7* 8.9  HGB 10.2* 9.6*  HCT 31.2* 29.7*  PLT 314 304  MCV 75.5* 73.9*  MCH 24.7* 23.9*  MCHC 32.7 32.3  RDW 14.2 14.0  LYMPHSABS 2.2 2.4  MONOABS 0.9 1.0  EOSABS 0.0 0.1  BASOSABS 0.0 0.0    Chemistries  Recent Labs  Lab 07/09/17 1002 07/10/17 0324  NA 136 141  K 4.3 4.0  CL 106 111  CO2 20* 19*  GLUCOSE 162* 121*  BUN 44* 36*  CREATININE 2.21* 1.84*  CALCIUM 8.5* 8.2*  AST 26  --   ALT  8*  --   ALKPHOS 71  --   BILITOT 0.6  --    ------------------------------------------------------------------------------------------------------------------ No results for input(s): CHOL, HDL, LDLCALC, TRIG, CHOLHDL, LDLDIRECT in the last 72 hours.  Lab Results  Component Value Date   HGBA1C 7.1 (H) 06/25/2017   ------------------------------------------------------------------------------------------------------------------ No results for input(s): TSH, T4TOTAL, T3FREE, THYROIDAB in the last 72 hours.  Invalid input(s): FREET3 ------------------------------------------------------------------------------------------------------------------ No results for input(s): VITAMINB12, FOLATE, FERRITIN, TIBC, IRON, RETICCTPCT in the last 72 hours.  Coagulation profile No results for input(s): INR, PROTIME in the last 168 hours.  No results for input(s): DDIMER in the last 72 hours.  Cardiac Enzymes No results for input(s): CKMB, TROPONINI, MYOGLOBIN in the last 168 hours.  Invalid input(s): CK ------------------------------------------------------------------------------------------------------------------ No results found for: BNP   Roxan Hockey M.D on 07/10/2017 at 5:58 PM  Between 7am to 7pm - Pager - [phone]  After 7pm go to [email or web address] - password [email or web address]  [facility] -  Office  [phone]   Voice Recognition Viviann Spare dictation system was used to create this note, attempts have been made to correct errors. Please contact the author with questions and/or clarifications.

## 2017-07-11 LAB — URINALYSIS, ROUTINE W REFLEX MICROSCOPIC
BILIRUBIN URINE: NEGATIVE
Glucose, UA: >=500 mg/dL — AB
Hgb urine dipstick: NEGATIVE
Ketones, ur: NEGATIVE mg/dL
LEUKOCYTES UA: NEGATIVE
Nitrite: NEGATIVE
PH: 5 (ref 5.0–8.0)
Protein, ur: >=300 mg/dL — AB
Specific Gravity, Urine: 1.011 (ref 1.005–1.030)

## 2017-07-11 LAB — STREP PNEUMONIAE URINARY ANTIGEN: Strep Pneumo Urinary Antigen: NEGATIVE

## 2017-07-11 NOTE — Progress Notes (Signed)
Patient Demographics:    Timothy [name], is a 77 y.o. male, DOB - 11/23/1940, LOV:[id number]  Admit date - 07/09/2017   Admitting Physician Phillips Grout, MD  Outpatient Primary MD for the patient is Patient, No Pcp Per  LOS - 2   Chief Complaint  Patient presents with  . flu symptoms  . Emesis        Subjective:    Timothy [name] today has no fevers, no emesis,  No chest pain, RN at bedside, resp status better, taking oral intake  Assessment  & Plan :    Principal Problem:   Flu syndrome Active Problems:   Closed TBI (traumatic brain injury) ([place])   Chronic kidney disease   Anemia in chronic kidney disease   Hypertension   PNA (pneumonia)  Brief summary Timothy [name] is a 77 y.o. male with medical history significant of diabetes, hypertension, dementia admitted on 07/09/2017 from skilled nursing facility for influenza A and possible pneumonia with AKI   Plan:- 1) influenza A-clinically improving from respiratory status standpoint, continue Tamiflu and supportive care along with bronchodilators  2)Possible HCAP-continues to improve, vancomycin and cefepime as ordered, bronchodilators and mucolytics as well, oxygen saturation noted  3) chronic anemia of CKD-monitor H&H closely, globin is above 9  4)HTN- stable, c/n Carvedilol 25 mg twice daily and Amlodipine 10 mg daily, may use as needed hydralazine  5)Prior Traumatic Brain injury and vision loss-supportive care, no significant behavioral problems noted  6) AK I on CKD-creatinine is down to 1.84, which is around patient's baseline, continue to hydrate gently, avoid nephrotoxic agents   Code Status : Full   Disposition Plan  : SNF   DVT Prophylaxis  :  Lovenox  Lab Results  Component Value Date   PLT 304 07/10/2017    Inpatient Medications  Scheduled Meds: . amLODipine  10 mg Oral Daily  . aspirin  81 mg Oral Daily  . carvedilol  6.25  mg Oral BID WC  . enoxaparin (LOVENOX) injection  30 mg Subcutaneous Q24H  . multivitamin with minerals  1 tablet Oral Daily  . ofloxacin  1 drop Left Eye QID  . oseltamivir  30 mg Oral Daily  . prednisoLONE acetate  1 drop Left Eye QID   Continuous Infusions: . ceFEPime (MAXIPIME) IV Stopped (07/11/17 1609)  . vancomycin 750 mg (07/11/17 1610)   PRN Meds:.    Anti-infectives (From admission, onward)   Start     Dose/Rate Route Frequency Ordered Stop   07/11/17 1600  vancomycin (VANCOCIN) IVPB 750 mg/150 ml premix     750 mg 150 mL/hr over 60 Minutes Intravenous Every 48 hours 07/09/17 1604     07/10/17 1600  ceFEPIme (MAXIPIME) 1 g in sodium chloride 0.9 % 100 mL IVPB     1 g 200 mL/hr over 30 Minutes Intravenous Every 24 hours 07/09/17 1503 07/17/17 1559   07/09/17 1615  oseltamivir (TAMIFLU) capsule 30 mg     30 mg Oral Daily 07/09/17 1602 07/14/17 0959   07/09/17 1615  ceFEPIme (MAXIPIME) 1 g in sodium chloride 0.9 % 100 mL IVPB     1 g 200 mL/hr over 30 Minutes Intravenous STAT 07/09/17 1603 07/09/17 1901   07/09/17 1615  vancomycin (VANCOCIN)  IVPB 1000 mg/200 mL premix     1,000 mg 200 mL/hr over 60 Minutes Intravenous STAT 07/09/17 1604 07/10/17 1615   07/09/17 1345  oseltamivir (TAMIFLU) capsule 75 mg  Status:  Discontinued     75 mg Oral Daily 07/09/17 1311 07/09/17 1602   07/09/17 1345  vancomycin (VANCOCIN) IVPB 1000 mg/200 mL premix  Status:  Discontinued     1,000 mg 200 mL/hr over 60 Minutes Intravenous  Once 07/09/17 1331 07/09/17 1503   07/09/17 1330  ceFEPIme (MAXIPIME) 2 g in sodium chloride 0.9 % 100 mL IVPB  Status:  Discontinued     2 g 200 mL/hr over 30 Minutes Intravenous  Once 07/09/17 1331 07/09/17 1503        Objective:   Vitals:   07/10/17 1412 07/10/17 2210 07/11/17 0625 07/11/17 1424  BP: 127/70 140/61 136/62 (!) 134/53  Pulse: (!) 57 (!) 57 (!) 58 62  Resp: 16 20 20 20   Temp: (!) 97.5 F (36.4 C) (!) 97.1 F (36.2 C) 98 F (36.7 C)  98.2 F (36.8 C)  TempSrc: Oral Oral Oral Oral  SpO2:  99% 99% 100%  Weight:      Height:        Wt Readings from Last 3 Encounters:  07/09/17 54.4 kg (119 lb 14.9 oz)  06/30/17 42.3 kg (93 lb 4.8 oz)  05/04/17 42.6 kg (94 lb)     Intake/Output Summary (Last 24 hours) at 07/11/2017 1634 Last data filed at 07/11/2017 2423 Gross per 24 hour  Intake 480 ml  Output 250 ml  Net 230 ml     Physical Exam  Gen:- Awake Alert, cooperative HEENT:- [place].AT, No sclera icterus Eyes-legally blind Neck-Supple Neck,No JVD,.  Lungs-improved air movement, no wheezing CV- S1, S2 normal Abd-  +ve B.Sounds, Abd Soft, No tenderness,    Extremity/Skin:- No  edema,    Psych-affect is appropriate Neuro-no new focal deficits   Data Review:   Micro Results Recent Results (from the past 240 hour(s))  Culture, blood (routine x 2) Call MD if unable to obtain prior to antibiotics being given     Status: None (Preliminary result)   Collection Time: 07/09/17  5:13 PM  Result Value Ref Range Status   Specimen Description   Final    BLOOD LEFT HAND Performed at [facility] [address], [place], [place] [address]    Special Requests   Final    BOTTLES DRAWN AEROBIC ONLY Blood Culture adequate volume Performed at [facility] [address], [place], [place] [address]    Culture   Final    NO GROWTH 2 DAYS Performed at [facility] [address], [place], [place] [address]    Report Status PENDING  Incomplete  Culture, blood (routine x 2) Call MD if unable to obtain prior to antibiotics being given     Status: None (Preliminary result)   Collection Time: 07/09/17  5:22 PM  Result Value Ref Range Status   Specimen Description   Final    BLOOD RIGHT HAND Performed at [facility] [address], [place], [place] [address]    Special Requests   Final    IN PEDIATRIC BOTTLE Blood Culture adequate volume Performed at [facility] [address], [place], [place] [address]    Culture   Final    NO GROWTH 2 DAYS Performed at [facility] [address], [place], [place]  [address]    Report Status PENDING  Incomplete    Radiology Reports Ct Chest Wo Contrast  Result Date: 07/09/2017 CLINICAL DATA:  77 year old male with flu like symptoms for 2 days. Flank pain. Vomiting. Abnormal lung opacity on chest radiograph earlier today. EXAM: CT CHEST, ABDOMEN AND PELVIS WITHOUT CONTRAST TECHNIQUE: Multidetector CT imaging of the chest, abdomen and pelvis was performed following the standard protocol without IV contrast. COMPARISON:  Chest and abdominal radiographs 1052 hr today. Cervical spine CT 11/15/2013. FINDINGS: CT CHEST FINDINGS Cardiovascular: Calcified coronary artery atherosclerosis and/or stents. Comparatively minimal calcified thoracic aortic atherosclerosis. Vascular patency is not evaluated in the absence of IV contrast. Mild cardiomegaly. No pericardial effusion. Mediastinum/Nodes: No mediastinal lymphadenopathy. Lungs/Pleura: Trace layering left pleural effusion. The central airways are patent and appear normal, but there is moderate to severe bronchiectasis throughout the lingula, the left lower lobe, in the right middle lobe (medial segment most affected), and in the inferior segment of the right upper lobe. There is is borderline to mild bronchiectasis elsewhere. Superimposed widespread peribronchial thickening and nodular/irregular opacity throughout the lingula and left lower lobe. Less pronounced peribronchial opacity in the right middle lobe. However, there is nodular, peribronchial and peripheral opacity in the right upper lobe with both ground-glass and solid features. Minimal distal peribronchial opacity in the posterior left upper lobe. No right lower lobe involvement. No consolidation. Musculoskeletal: No acute osseous abnormality identified. Chronic posttraumatic or degenerative  changes to thoracic spinous processes T6 through T8. Small benign bone island in the T4 vertebral body. Chronic right anterolateral 4th and 5th rib fractures. CT ABDOMEN PELVIS FINDINGS Hepatobiliary: Diminutive gallbladder.  Negative noncontrast liver. Pancreas: Negative. Spleen: Negative. Adrenals/Urinary Tract: Normal adrenal glands. Normal noncontrast appearance of both kidneys. No nephrolithiasis or hydronephrosis. Decompressed proximal ureters. Unremarkable urinary bladder. Stomach/Bowel: Stool ball in the rectum. Mild retained stool in the descending and sigmoid colon. Mildly redundant but otherwise negative transverse colon. Mild motion artifact in the right abdomen. No right colon inflammation identified. Normal appendix. No dilated small bowel. Negative stomach and duodenum. No abdominal free air or free fluid. Vascular/Lymphatic: Mild Calcified aortic atherosclerosis. But advanced pelvic and proximal femoral artery calcified atherosclerosis. There is also somewhat advanced calcified atherosclerosis of the SMA. Vascular patency is not evaluated in the absence of IV contrast. No lymphadenopathy. There is an oval dense calcification in the distal small bowel mesentery which might be a chronic postinflammatory node (series 2, image 93). Reproductive: Negative. Other: No pelvic free fluid. Musculoskeletal: No acute osseous abnormality identified. 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 IMPRESSION: 1. 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 2. Patchy and nodular right greater than left upper lobe opacity compatible with acute distal airway infection. Superimposed more pronounced lingula and left lower lobe opacity probably represents a combination of chronic and recurrent peribronchial infection in the setting of bronchiectasis, such as MAI. 3. Trace left pleural effusion. 4. No acute or inflammatory process identified in the noncontrast abdomen or  pelvis; consider fecal impaction in light of rectal stool ball. 5. Mild for age calcified aortic atherosclerosis, but advanced pelvic, proximal femoral, and coronary artery calcified atherosclerosis. 6. Mild cardiomegaly. Electronically Signed   By: Genevie Ann M.D.   On: 07/09/2017 12:20   US Renal  Result Date: 06/25/2017 CLINICAL DATA:  Hyperkalemia for 1 day. EXAM: RENAL / URINARY TRACT ULTRASOUND COMPLETE COMPARISON:  None. FINDINGS: Right Kidney: Length: 9.9 cm.  Increased cortical echogenicity. Left Kidney: Length: 10.5 cm.  Increased cortical echogenicity. Bladder: Appears normal for degree of bladder distention. Bilateral ureteral jets were not seen. IMPRESSION: Increased cortical echogenicity of bilateral kidneys, suggestive of intrinsic renal disease. Electronically Signed   By: Fidela Salisbury M.D.   On: 06/25/2017 22:22   Dg Abd Acute W/chest  Result Date: 07/09/2017 CLINICAL DATA:  Vomiting, cough EXAM: DG ABDOMEN ACUTE W/ 1V CHEST COMPARISON:  None. FINDINGS: Mild cardiomegaly. Airspace opacity noted in the left lower lobe compatible with pneumonia. Possible nodules in the right lung. No effusions. Nonobstructive bowel gas pattern. No free air organomegaly. No suspicious calcification. IMPRESSION: Left lower lobe consolidation compatible with pneumonia. Questionable nodules in the right lung. These could be further evaluated with chest CT. No evidence of bowel obstruction or free air in the abdomen. Electronically Signed   By: Rolm Baptise M.D.   On: 07/09/2017 11:08   Ct Renal Stone Study  Result Date: 07/09/2017 CLINICAL DATA:  77 year old male with flu like symptoms for 2 days. Flank pain. Vomiting. Abnormal lung opacity on chest radiograph earlier today. EXAM: CT CHEST, ABDOMEN AND PELVIS WITHOUT CONTRAST TECHNIQUE: Multidetector CT imaging of the chest, abdomen and pelvis was performed following the standard protocol without IV contrast. COMPARISON:  Chest and abdominal radiographs  1052 hr today. Cervical spine CT 11/15/2013. FINDINGS: CT CHEST FINDINGS Cardiovascular: Calcified coronary artery atherosclerosis and/or stents. Comparatively minimal calcified thoracic aortic atherosclerosis. Vascular patency is not evaluated in the absence of IV contrast. Mild cardiomegaly. No pericardial effusion. Mediastinum/Nodes: No mediastinal lymphadenopathy. Lungs/Pleura: Trace layering left pleural effusion. The central airways are patent and appear normal, but there is moderate to severe bronchiectasis throughout the lingula, the left lower lobe, in the right middle lobe (medial segment most affected), and in the inferior segment of the right upper lobe. There is is borderline to mild bronchiectasis elsewhere. Superimposed widespread peribronchial thickening and nodular/irregular opacity throughout the lingula and left lower lobe. Less pronounced peribronchial opacity in the right middle lobe. However, there is nodular, peribronchial and peripheral opacity in the right upper lobe with both ground-glass and solid features. Minimal distal peribronchial opacity in the posterior left upper lobe. No right lower lobe involvement. No consolidation. Musculoskeletal: No acute osseous abnormality identified. Chronic posttraumatic or degenerative changes to thoracic spinous processes T6 through T8. Small benign bone island in the T4 vertebral body. Chronic right anterolateral 4th and 5th rib fractures. CT ABDOMEN PELVIS FINDINGS Hepatobiliary: Diminutive gallbladder.  Negative noncontrast liver. Pancreas: Negative. Spleen: Negative. Adrenals/Urinary Tract: Normal adrenal glands. Normal noncontrast appearance of both kidneys. No nephrolithiasis or hydronephrosis. Decompressed proximal ureters. Unremarkable urinary bladder. Stomach/Bowel: Stool ball in the rectum. Mild retained stool in the descending and sigmoid colon. Mildly redundant but otherwise negative transverse colon. Mild motion artifact in the right  abdomen. No right colon inflammation identified. Normal appendix. No dilated small bowel. Negative stomach and duodenum. No abdominal free air or free fluid. Vascular/Lymphatic: Mild Calcified aortic atherosclerosis. But advanced pelvic and proximal femoral artery calcified atherosclerosis. There is also somewhat advanced calcified atherosclerosis of the SMA. Vascular patency is not evaluated in the absence of IV contrast. No lymphadenopathy. There is an oval dense calcification in the distal small bowel mesentery which might be a chronic postinflammatory node (series 2, image 93). Reproductive: Negative. Other: No pelvic free fluid. Musculoskeletal: No acute osseous abnormality identified. 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 IMPRESSION: 1. 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 2. Patchy and nodular right greater than left upper  lobe opacity compatible with acute distal airway infection. Superimposed more pronounced lingula and left lower lobe opacity probably represents a combination of chronic and recurrent peribronchial infection in the setting of bronchiectasis, such as MAI. 3. Trace left pleural effusion. 4. No acute or inflammatory process identified in the noncontrast abdomen or pelvis; consider fecal impaction in light of rectal stool ball. 5. Mild for age calcified aortic atherosclerosis, but advanced pelvic, proximal femoral, and coronary artery calcified atherosclerosis. 6. Mild cardiomegaly. Electronically Signed   By: Genevie Ann M.D.   On: 07/09/2017 12:20     CBC Recent Labs  Lab 07/09/17 1002 07/10/17 0324  WBC 14.7* 8.9  HGB 10.2* 9.6*  HCT 31.2* 29.7*  PLT 314 304  MCV 75.5* 73.9*  MCH 24.7* 23.9*  MCHC 32.7 32.3  RDW 14.2 14.0  LYMPHSABS 2.2 2.4  MONOABS 0.9 1.0  EOSABS 0.0 0.1  BASOSABS 0.0 0.0    Chemistries  Recent Labs  Lab 07/09/17 1002 07/10/17 0324  NA 136 141  K 4.3 4.0  CL 106 111  CO2 20* 19*    GLUCOSE 162* 121*  BUN 44* 36*  CREATININE 2.21* 1.84*  CALCIUM 8.5* 8.2*  AST 26  --   ALT 8*  --   ALKPHOS 71  --   BILITOT 0.6  --    ------------------------------------------------------------------------------------------------------------------ No results for input(s): CHOL, HDL, LDLCALC, TRIG, CHOLHDL, LDLDIRECT in the last 72 hours.  Lab Results  Component Value Date   HGBA1C 7.1 (H) 06/25/2017   ------------------------------------------------------------------------------------------------------------------ No results for input(s): TSH, T4TOTAL, T3FREE, THYROIDAB in the last 72 hours.  Invalid input(s): FREET3 ------------------------------------------------------------------------------------------------------------------ No results for input(s): VITAMINB12, FOLATE, FERRITIN, TIBC, IRON, RETICCTPCT in the last 72 hours.  Coagulation profile No results for input(s): INR, PROTIME in the last 168 hours.  No results for input(s): DDIMER in the last 72 hours.  Cardiac Enzymes No results for input(s): CKMB, TROPONINI, MYOGLOBIN in the last 168 hours.  Invalid input(s): CK ------------------------------------------------------------------------------------------------------------------ No results found for: BNP   Timothy [name] M.D on 07/11/2017 at 4:34 PM  Between 7am to 7pm - Pager - [phone]  After 7pm go to [email or web address] - password [email or web address]  [facility] -  Office  [phone]   Voice Recognition Viviann Spare dictation system was used to create this note, attempts have been made to correct errors. Please contact the author with questions and/or clarifications.

## 2017-07-12 LAB — CBC
HCT: 29.3 % — ABNORMAL LOW (ref 39.0–52.0)
Hemoglobin: 9.4 g/dL — ABNORMAL LOW (ref 13.0–17.0)
MCH: 24 pg — AB (ref 26.0–34.0)
MCHC: 32.1 g/dL (ref 30.0–36.0)
MCV: 74.9 fL — ABNORMAL LOW (ref 78.0–100.0)
PLATELETS: 403 10*3/uL — AB (ref 150–400)
RBC: 3.91 MIL/uL — ABNORMAL LOW (ref 4.22–5.81)
RDW: 14.2 % (ref 11.5–15.5)
WBC: 10.1 10*3/uL (ref 4.0–10.5)

## 2017-07-12 LAB — BASIC METABOLIC PANEL
Anion gap: 8 (ref 5–15)
BUN: 24 mg/dL — AB (ref 6–20)
CO2: 22 mmol/L (ref 22–32)
CREATININE: 1.58 mg/dL — AB (ref 0.61–1.24)
Calcium: 8.3 mg/dL — ABNORMAL LOW (ref 8.9–10.3)
Chloride: 109 mmol/L (ref 101–111)
GFR calc Af Amer: 47 mL/min — ABNORMAL LOW (ref >60)
GFR, EST NON AFRICAN AMERICAN: 40 mL/min — AB (ref >60)
GLUCOSE: 110 mg/dL — AB (ref 65–99)
Potassium: 4.3 mmol/L (ref 3.5–5.1)
Sodium: 139 mmol/L (ref 135–145)

## 2017-07-12 MED ORDER — VANCOMYCIN HCL IN DEXTROSE 750-5 MG/150ML-% IV SOLN: 750.0000 mg | INTRAVENOUS | Status: DC

## 2017-07-12 MED ORDER — FERROUS GLUCONATE 324 (38 FE) MG PO TABS: 324.0000 mg | ORAL_TABLET | Freq: Every day | ORAL | 3 refills | Status: DC

## 2017-07-12 MED ORDER — METFORMIN HCL 500 MG PO TABS: ORAL_TABLET | ORAL | 11 refills | Status: DC

## 2017-07-12 MED ORDER — PREDNISONE 20 MG PO TABS: 20.0000 mg | ORAL_TABLET | Freq: Every day | ORAL | 0 refills | Status: DC

## 2017-07-12 MED ORDER — ADULT MULTIVITAMIN W/MINERALS CH: 1.0000 | ORAL_TABLET | Freq: Every day | ORAL | 2 refills | Status: DC

## 2017-07-12 MED ORDER — OSELTAMIVIR PHOSPHATE 30 MG PO CAPS: 30.0000 mg | ORAL_CAPSULE | Freq: Two times a day (BID) | ORAL | 0 refills | Status: AC

## 2017-07-12 MED ORDER — FOLIC ACID 1 MG PO TABS: 1.0000 mg | ORAL_TABLET | Freq: Every day | ORAL | Status: DC

## 2017-07-12 MED ORDER — ALBUTEROL SULFATE HFA 108 (90 BASE) MCG/ACT IN AERS: 2.0000 | INHALATION_SPRAY | Freq: Four times a day (QID) | RESPIRATORY_TRACT | 2 refills | Status: DC

## 2017-07-12 MED ORDER — GUAIFENESIN ER 600 MG PO TB12: 600.0000 mg | ORAL_TABLET | Freq: Two times a day (BID) | ORAL | 0 refills | Status: AC

## 2017-07-12 MED ORDER — AZITHROMYCIN 500 MG PO TABS: 500.0000 mg | ORAL_TABLET | Freq: Every day | ORAL | 0 refills | Status: AC

## 2017-07-12 MED ORDER — AMLODIPINE BESYLATE 10 MG PO TABS: 10.0000 mg | ORAL_TABLET | Freq: Every day | ORAL | 3 refills | Status: DC

## 2017-07-12 MED ORDER — CEFDINIR 300 MG PO CAPS: 300.0000 mg | ORAL_CAPSULE | Freq: Two times a day (BID) | ORAL | 0 refills | Status: AC

## 2017-07-12 MED ORDER — CARVEDILOL 6.25 MG PO TABS: 6.2500 mg | ORAL_TABLET | Freq: Two times a day (BID) | ORAL | 3 refills | Status: DC

## 2017-07-12 NOTE — Discharge Instructions (Signed)
1)Please give medications including Tamiflu, antibiotics, mucolytics and inhaler as prescribed 2) please encourage adequate hydration/oral intake/fluids

## 2017-07-12 NOTE — Progress Notes (Signed)
Discharge instructions and prescriptions given to niece who will drive pt to [facility]. IV discontinued.

## 2017-07-12 NOTE — Care Management Important Message (Signed)
Important Message  Patient Details  Name: Alecxander Mainwaring MRN: [id number] Date of Birth: 1940-08-05   Medicare Important Message Given:  Yes    Apolonio Schneiders, RN 07/12/2017, 11:17 AM

## 2017-07-12 NOTE — NC FL2 (Signed)
[place] LEVEL OF CARE SCREENING TOOL     IDENTIFICATION  Patient Name: Timothy [name] Birthdate: May 27, 1940 Sex: male Admission Date (Current Location): 07/09/2017  [facility] and [place] Number:  Herbalist and Address:  [facility],  [place] [address] [place]      Provider Number: [id number]  Attending Physician Name and Address:  Roxan Hockey, MD  Relative Name and Phone Number:  [name], Niece, [phone]     Current Level of Care: Hospital Recommended Level of Care: [place] Prior Approval Number:    Date Approved/Denied:   PASRR Number:    Discharge Plan: Domiciliary (Rest home)(ALF)    Current Diagnoses: Patient Active Problem List   Diagnosis Date Noted  . 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 PNA (pneumonia) 07/09/2017  . HCAP (healthcare-associated pneumonia)   . Hypertension 06/26/2017  . Hypertensive emergency 06/25/2017  . Acute kidney injury superimposed on chronic kidney disease ([place]) 06/25/2017  . Hyperkalemia 06/25/2017  . 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 Left retinal detachment   . 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 Bilateral cataracts 04/08/2017  . DM (diabetes mellitus), type 2 with renal complications ([place])   . Alcohol abuse   . Poor vision   . Chronic kidney disease 02/13/2017  . 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 Epigastric pain 02/11/2017  . Fall at home 11/16/2013  . Closed TBI (traumatic brain injury) ([place]) 11/16/2013  . SDH (subdural hematoma) ([facility]) 11/15/2013    Orientation RESPIRATION BLADDER Height & Weight     Self  Normal Continent Weight: 119 lb 14.9 oz (54.4 kg) Height:  5\' 5"  (165.1 cm)  BEHAVIORAL SYMPTOMS/MOOD NEUROLOGICAL BOWEL NUTRITION STATUS      Continent Diet(please see DC summary)  AMBULATORY STATUS COMMUNICATION OF NEEDS Skin   (baseline) Verbally Normal                        Personal Care Assistance Level of Assistance  Bathing, Feeding, Dressing(baseline)           Functional Limitations Info             SPECIAL CARE FACTORS FREQUENCY                       Contractures Contractures Info: Not present    Additional Factors Info  Code Status, Allergies Code Status Info: Full Allergies Info: No Known Allergies           Current Medications (07/12/2017):  This is the current hospital active medication list Current Facility-Administered Medications  Medication Dose Route Frequency Provider Last Rate Last Dose  . amLODipine (NORVASC) tablet 10 mg  10 mg Oral Daily Derrill Kay A, MD   10 mg at 07/11/17 1203  . aspirin chewable tablet 81 mg  81 mg Oral Daily Derrill Kay A, MD   81 mg at 07/11/17 1203  . carvedilol (COREG) tablet 6.25 mg  6.25 mg Oral BID WC Derrill Kay A, MD   6.25 mg at 07/12/17 0900  . ceFEPIme (MAXIPIME) 1 g in sodium chloride 0.9 % 100 mL IVPB  1 g Intravenous Q24H Phillips Grout, MD   Stopped at 07/11/17 1609  . enoxaparin (LOVENOX) injection 30 mg  30 mg Subcutaneous Q24H Derrill Kay A, MD   30 mg at 07/11/17 2158  . hydrALAZINE (APRESOLINE) injection 10 mg  10 mg Intravenous Q6H PRN Emokpae, Courage, MD      . multivitamin with minerals tablet 1 tablet  1 tablet Oral Daily Phillips Grout, MD   1 tablet at 07/11/17 1203  . ofloxacin (OCUFLOX) 0.3 % ophthalmic solution 1 drop  1 drop Left Eye QID Phillips Grout, MD   1 drop at 07/11/17 2157  . oseltamivir (TAMIFLU) capsule 30 mg  30 mg Oral Daily Derrill Kay A, MD   30 mg at 07/11/17 1203  . prednisoLONE acetate (PRED FORTE) 1 % ophthalmic suspension 1 drop  1 drop Left Eye QID Phillips Grout, MD   1 drop at 07/11/17 2157  . [START ON 07/13/2017] vancomycin (VANCOCIN) IVPB 750 mg/150 ml premix  750 mg Intravenous Q36H Royetta Asal, RPH         Discharge Medications: Please see discharge summary for a list of discharge  medications.  Relevant Imaging Results:  Relevant Lab Results:   Additional Information SSN: [id number]  Estanislado Emms, LCSW

## 2017-07-12 NOTE — Progress Notes (Addendum)
Patient will discharge back to [facility]. Anticipated discharge date: 07/12/17 Family notified: Timothy [name], niece Transportation by: Niece will drive patient, pickup at [facility]  Nurse to call report to [phone].   CSW signing off.  Estanislado Emms, [place]  Clinical Social Worker

## 2017-07-12 NOTE — Discharge Summary (Signed)
Timothy [name], is a 77 y.o. male  DOB 16-Jan-1941  MRN [id number].  Admission date:  07/09/2017  Admitting Physician  Phillips Grout, MD  Discharge Date:  07/12/2017   Primary MD  Patient, No Pcp Per  Recommendations for primary care physician for things to follow:   1)Please give medications including Tamiflu, antibiotics, mucolytics and inhaler as prescribed 2)Please encourage adequate hydration/oral intake/fluids   Admission Diagnosis  Flu syndrome [J11.1] HCAP (healthcare-associated pneumonia) [J18.9]   Discharge Diagnosis  Flu syndrome [J11.1] HCAP (healthcare-associated pneumonia) [J18.9]    Principal Problem:   Flu syndrome Active Problems:   Closed TBI (traumatic brain injury) ([place])   Chronic kidney disease   Anemia in chronic kidney disease   Hypertension   PNA (pneumonia)      Past Medical History:  Diagnosis Date  . Alcohol abuse    Patient denies, but was shared with people from [facility] by his male roommate.  . Bilateral cataracts 04/08/2017  . DM (diabetes mellitus), type 2 with renal complications ([place])    uncertain when diagnosed  . HOH (hard of hearing)   . Hypertension   . Legally blind    B/L  . Poor vision     Past Surgical History:  Procedure Laterality Date  . PARS PLANA VITRECTOMY Left 06/29/2017   Procedure: PARS PLANA VITRECTOMY WITH 25 GAUGE AND MEMBRANE PEEL WITH GAS EXCHANGE, AIR SILICONE OIL AND PHACO COAGULATION, LASER;  Surgeon: Jalene Mullet, MD;  Location: [facility];  Service: Ophthalmology;  Laterality: Left;     HPI  from the history and physical done on the day of admission:    Chief Complaint: Fevers chills  HPI: [name] is a 77 y.o. male with medical history significant of diabetes, hypertension, dementia comes in from skilled nursing facility for fevers and chills for 2 days.  Patient cannot provide any history due to his dementia.  He can answer  yes or no questions.  Per report he has been having chills and fevers for about 48 hours sent in for possible influenza.  Patient has the flu and also has bilateral multifocal infiltrates.  He is afebrile here vital signs are stable with normal O2 sats.  Patient was just hospitalized a couple weeks ago for uncontrolled hypertension.  Patient is being referred for admission for healthcare associated pneumonia and influenza.  Review of Systems: Unobtainable due to dementia   Hospital Course:     Brief summary Calven Nengis a 77 y.o.malewith medical history significant ofdiabetes, hypertension, dementia admitted on 07/09/2017 from skilled nursing facility for influenza A and possible pneumonia with AKI   Plan:- 1) influenza A-clinically much improved from respiratory status standpoint, discharge back to assisted living on Tamiflu with supportive care along with bronchodilators, encourage adequate hydration  2)PNA- ?? HCAP-much improved, no fevers, no leukocytosis, initially treated with vancomycin and cefepime, may discharge home on azithromycin and Omnicef as ordered, continue bronchodilators and mucolytics as well,   3)Chronic Anemia of CKD-monitor H&H closely, Hgb is stable above 9  4)HTN- stable, c/n Carvedilol 25 mg twice daily and Amlodipine 10 mg daily,   5)Prior Traumatic Brain injury and Vision Loss- c/n supportive care, no significant behavioral problems noted  6) AKI on CKD-much improved with hydration, creatinine is down to 1.5, which is actually lower than patient's baseline,  avoid nephrotoxic agents  Discharge Condition: stable  Follow UP  Contact information for after-discharge care    [facility] .   Service:  Assisted Living Contact information: [address] [place] [phone]            Diet and Activity recommendation:  As advised  Discharge Instructions     Discharge Instructions      Call MD for:  difficulty breathing, headache or visual disturbances   Complete by:  As directed    Call MD for:  persistant dizziness or light-headedness   Complete by:  As directed    Call MD for:  persistant nausea and vomiting   Complete by:  As directed    Call MD for:  temperature >100.4   Complete by:  As directed    Diet general   Complete by:  As directed    Discharge instructions   Complete by:  As directed    1)Please give medications including Tamiflu, antibiotics, mucolytics and inhaler as prescribed 2) please encourage adequate hydration/oral intake/fluids   Increase activity slowly   Complete by:  As directed         Discharge Medications     Allergies as of 07/12/2017   No Known Allergies     Medication List    TAKE these medications   albuterol 108 (90 Base) MCG/ACT inhaler Commonly known as:  PROVENTIL HFA;VENTOLIN HFA Inhale 2 puffs into the lungs 4 (four) times daily for 10 days.   amLODipine 10 MG tablet Commonly known as:  NORVASC Take 1 tablet (10 mg total) by mouth daily.   aspirin 81 MG chewable tablet Chew 1 tablet (81 mg total) by mouth daily.   azithromycin 500 MG tablet Commonly known as:  ZITHROMAX Take 1 tablet (500 mg total) by mouth daily for 5 days.   carvedilol 6.25 MG tablet Commonly known as:  COREG Take 1 tablet (6.25 mg total) by mouth 2 (two) times daily with a meal.   cefdinir 300 MG capsule Commonly known as:  OMNICEF Take 1 capsule (300 mg total) by mouth 2 (two) times daily for 7 days.   ferrous gluconate 324 MG tablet Commonly known as:  FERGON Take 1 tablet (324 mg total) by mouth daily with breakfast. What changed:  additional instructions   folic acid 1 MG tablet Commonly known as:  FOLVITE Take 1 tablet (1 mg total) by mouth daily. What changed:  additional instructions   guaiFENesin 600 MG 12 hr tablet Commonly known as:  MUCINEX Take 1 tablet (600 mg total) by mouth 2 (two) times daily for 10 days.    metFORMIN 500 MG tablet Commonly known as:  GLUCOPHAGE 1/2 tab by mouth twice daily What changed:    how much to take  how to take this  when to take this  additional instructions   multivitamin with minerals Tabs tablet Take 1 tablet by mouth daily.   ofloxacin 0.3 % ophthalmic solution Commonly known as:  OCUFLOX Place 1 drop into the left eye 4 (four) times daily.   oseltamivir 30 MG capsule Commonly known as:  TAMIFLU Take 1 capsule (30  mg total) by mouth 2 (two) times daily for 3 days.   prednisoLONE acetate 1 % ophthalmic suspension Commonly known as:  PRED FORTE Place 1 drop into the left eye 4 (four) times daily.   predniSONE 20 MG tablet Commonly known as:  DELTASONE Take 1 tablet (20 mg total) by mouth daily with breakfast.   thiamine 100 MG tablet Take 1 tablet (100 mg total) by mouth daily. What changed:  additional instructions       Major procedures and Radiology Reports - PLEASE review detailed and final reports for all details, in brief -   Ct Chest Wo Contrast  Result Date: 07/09/2017 CLINICAL DATA:  77 year old male with flu like symptoms for 2 days. Flank pain. Vomiting. Abnormal lung opacity on chest radiograph earlier today. EXAM: CT CHEST, ABDOMEN AND PELVIS WITHOUT CONTRAST TECHNIQUE: Multidetector CT imaging of the chest, abdomen and pelvis was performed following the standard protocol without IV contrast. COMPARISON:  Chest and abdominal radiographs 1052 hr today. Cervical spine CT 11/15/2013. FINDINGS: CT CHEST FINDINGS Cardiovascular: Calcified coronary artery atherosclerosis and/or stents. Comparatively minimal calcified thoracic aortic atherosclerosis. Vascular patency is not evaluated in the absence of IV contrast. Mild cardiomegaly. No pericardial effusion. Mediastinum/Nodes: No mediastinal lymphadenopathy. Lungs/Pleura: Trace layering left pleural effusion. The central airways are patent and appear normal, but there is moderate to severe  bronchiectasis throughout the lingula, the left lower lobe, in the right middle lobe (medial segment most affected), and in the inferior segment of the right upper lobe. There is is borderline to mild bronchiectasis elsewhere. Superimposed widespread peribronchial thickening and nodular/irregular opacity throughout the lingula and left lower lobe. Less pronounced peribronchial opacity in the right middle lobe. However, there is nodular, peribronchial and peripheral opacity in the right upper lobe with both ground-glass and solid features. Minimal distal peribronchial opacity in the posterior left upper lobe. No right lower lobe involvement. No consolidation. Musculoskeletal: No acute osseous abnormality identified. Chronic posttraumatic or degenerative changes to thoracic spinous processes T6 through T8. Small benign bone island in the T4 vertebral body. Chronic right anterolateral 4th and 5th rib fractures. CT ABDOMEN PELVIS FINDINGS Hepatobiliary: Diminutive gallbladder.  Negative noncontrast liver. Pancreas: Negative. Spleen: Negative. Adrenals/Urinary Tract: Normal adrenal glands. Normal noncontrast appearance of both kidneys. No nephrolithiasis or hydronephrosis. Decompressed proximal ureters. Unremarkable urinary bladder. Stomach/Bowel: Stool ball in the rectum. Mild retained stool in the descending and sigmoid colon. Mildly redundant but otherwise negative transverse colon. Mild motion artifact in the right abdomen. No right colon inflammation identified. Normal appendix. No dilated small bowel. Negative stomach and duodenum. No abdominal free air or free fluid. Vascular/Lymphatic: Mild Calcified aortic atherosclerosis. But advanced pelvic and proximal femoral artery calcified atherosclerosis. There is also somewhat advanced calcified atherosclerosis of the SMA. Vascular patency is not evaluated in the absence of IV contrast. No lymphadenopathy. There is an oval dense calcification in the distal small bowel  mesentery which might be a chronic postinflammatory node (series 2, image 93). Reproductive: Negative. Other: No pelvic free fluid. Musculoskeletal: No acute osseous abnormality identified. 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 IMPRESSION: 1. 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 2. Patchy and nodular right greater than left upper lobe opacity compatible with acute distal airway infection. Superimposed more pronounced lingula and left lower lobe opacity probably represents a combination of chronic and recurrent peribronchial infection in the setting of bronchiectasis, such as MAI. 3. Trace left pleural effusion. 4. No acute or inflammatory process  identified in the noncontrast abdomen or pelvis; consider fecal impaction in light of rectal stool ball. 5. Mild for age calcified aortic atherosclerosis, but advanced pelvic, proximal femoral, and coronary artery calcified atherosclerosis. 6. Mild cardiomegaly. Electronically Signed   By: Genevie Ann M.D.   On: 07/09/2017 12:20   US Renal  Result Date: 06/25/2017 CLINICAL DATA:  Hyperkalemia for 1 day. EXAM: RENAL / URINARY TRACT ULTRASOUND COMPLETE COMPARISON:  None. FINDINGS: Right Kidney: Length: 9.9 cm.  Increased cortical echogenicity. Left Kidney: Length: 10.5 cm.  Increased cortical echogenicity. Bladder: Appears normal for degree of bladder distention. Bilateral ureteral jets were not seen. IMPRESSION: Increased cortical echogenicity of bilateral kidneys, suggestive of intrinsic renal disease. Electronically Signed   By: Fidela Salisbury M.D.   On: 06/25/2017 22:22   Dg Abd Acute W/chest  Result Date: 07/09/2017 CLINICAL DATA:  Vomiting, cough EXAM: DG ABDOMEN ACUTE W/ 1V CHEST COMPARISON:  None. FINDINGS: Mild cardiomegaly. Airspace opacity noted in the left lower lobe compatible with pneumonia. Possible nodules in the right lung. No effusions. Nonobstructive bowel gas pattern. No free air  organomegaly. No suspicious calcification. IMPRESSION: Left lower lobe consolidation compatible with pneumonia. Questionable nodules in the right lung. These could be further evaluated with chest CT. No evidence of bowel obstruction or free air in the abdomen. Electronically Signed   By: Rolm Baptise M.D.   On: 07/09/2017 11:08   Ct Renal Stone Study  Result Date: 07/09/2017 CLINICAL DATA:  77 year old male with flu like symptoms for 2 days. Flank pain. Vomiting. Abnormal lung opacity on chest radiograph earlier today. EXAM: CT CHEST, ABDOMEN AND PELVIS WITHOUT CONTRAST TECHNIQUE: Multidetector CT imaging of the chest, abdomen and pelvis was performed following the standard protocol without IV contrast. COMPARISON:  Chest and abdominal radiographs 1052 hr today. Cervical spine CT 11/15/2013. FINDINGS: CT CHEST FINDINGS Cardiovascular: Calcified coronary artery atherosclerosis and/or stents. Comparatively minimal calcified thoracic aortic atherosclerosis. Vascular patency is not evaluated in the absence of IV contrast. Mild cardiomegaly. No pericardial effusion. Mediastinum/Nodes: No mediastinal lymphadenopathy. Lungs/Pleura: Trace layering left pleural effusion. The central airways are patent and appear normal, but there is moderate to severe bronchiectasis throughout the lingula, the left lower lobe, in the right middle lobe (medial segment most affected), and in the inferior segment of the right upper lobe. There is is borderline to mild bronchiectasis elsewhere. Superimposed widespread peribronchial thickening and nodular/irregular opacity throughout the lingula and left lower lobe. Less pronounced peribronchial opacity in the right middle lobe. However, there is nodular, peribronchial and peripheral opacity in the right upper lobe with both ground-glass and solid features. Minimal distal peribronchial opacity in the posterior left upper lobe. No right lower lobe involvement. No consolidation.  Musculoskeletal: No acute osseous abnormality identified. Chronic posttraumatic or degenerative changes to thoracic spinous processes T6 through T8. Small benign bone island in the T4 vertebral body. Chronic right anterolateral 4th and 5th rib fractures. CT ABDOMEN PELVIS FINDINGS Hepatobiliary: Diminutive gallbladder.  Negative noncontrast liver. Pancreas: Negative. Spleen: Negative. Adrenals/Urinary Tract: Normal adrenal glands. Normal noncontrast appearance of both kidneys. No nephrolithiasis or hydronephrosis. Decompressed proximal ureters. Unremarkable urinary bladder. Stomach/Bowel: Stool ball in the rectum. Mild retained stool in the descending and sigmoid colon. Mildly redundant but otherwise negative transverse colon. Mild motion artifact in the right abdomen. No right colon inflammation identified. Normal appendix. No dilated small bowel. Negative stomach and duodenum. No abdominal free air or free fluid. Vascular/Lymphatic: Mild Calcified aortic atherosclerosis. But advanced pelvic and proximal femoral artery calcified  atherosclerosis. There is also somewhat advanced calcified atherosclerosis of the SMA. Vascular patency is not evaluated in the absence of IV contrast. No lymphadenopathy. There is an oval dense calcification in the distal small bowel mesentery which might be a chronic postinflammatory node (series 2, image 93). Reproductive: Negative. Other: No pelvic free fluid. Musculoskeletal: No acute osseous abnormality identified. 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 IMPRESSION: 1. 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 2. Patchy and nodular right greater than left upper lobe opacity compatible with acute distal airway infection. Superimposed more pronounced lingula and left lower lobe opacity probably represents a combination of chronic and recurrent peribronchial infection in the setting of bronchiectasis, such as MAI. 3. Trace left  pleural effusion. 4. No acute or inflammatory process identified in the noncontrast abdomen or pelvis; consider fecal impaction in light of rectal stool ball. 5. Mild for age calcified aortic atherosclerosis, but advanced pelvic, proximal femoral, and coronary artery calcified atherosclerosis. 6. Mild cardiomegaly. Electronically Signed   By: Genevie Ann M.D.   On: 07/09/2017 12:20    Micro Results   Recent Results (from the past 240 hour(s))  Culture, blood (routine x 2) Call MD if unable to obtain prior to antibiotics being given     Status: None (Preliminary result)   Collection Time: 07/09/17  5:13 PM  Result Value Ref Range Status   Specimen Description   Final    BLOOD LEFT HAND Performed at [facility] [address] [place], [place] [address]    Special Requests   Final    BOTTLES DRAWN AEROBIC ONLY Blood Culture adequate volume Performed at [facility] [address], [place], [place] [address]    Culture   Final    NO GROWTH 2 DAYS Performed at [facility] [address], [place], [place] [address]    Report Status PENDING  Incomplete  Culture, blood (routine x 2) Call MD if unable to obtain prior to antibiotics being given     Status: None (Preliminary result)   Collection Time: 07/09/17  5:22 PM  Result Value Ref Range Status   Specimen Description   Final    BLOOD RIGHT HAND Performed at [facility] [address] [place], [place] [address]    Special Requests   Final    IN PEDIATRIC BOTTLE Blood Culture adequate volume Performed at [facility] [address], [place], [place] [address]    Culture   Final    NO GROWTH 2 DAYS Performed at [facility] [address] [place], [place] [address]    Report Status PENDING  Incomplete       Today   Subjective    Timothy [name] today has no new complaints, no fevers no vomiting , no diarrhea, resting comfortably,  no  hypoxia          Patient has been seen and examined prior to discharge   Objective   Blood pressure (!) 162/66, pulse 60, temperature 97.6 F (36.4 C), temperature source Oral, resp. rate 20, height 5\' 5"  (1.651 m), weight 54.4 kg (119 lb 14.9 oz), SpO2 100 %.   Intake/Output Summary (Last 24 hours) at 07/12/2017 1252 Last data filed at 07/12/2017 0522 Gross per 24 hour  Intake 240 ml  Output 650 ml  Net -410 ml    Exam Gen:- Awake Alert, cooperative HEENT:- [place].AT, No sclera icterus Eyes-legally blind  Neck-Supple Neck,No JVD,.  Lungs-improved air movement, no wheezing CV- S1, S2 normal Abd-  +ve B.Sounds, Abd Soft, No tenderness,    Extremity/Skin:- No  edema,    Psych-affect is appropriate Neuro-no new focal deficits     Data Review   CBC w Diff:  Lab Results  Component Value Date   WBC 10.1 07/12/2017   HGB 9.4 (L) 07/12/2017   HGB 10.4 (L) 02/11/2017   HCT 29.3 (L) 07/12/2017   HCT 30.6 (L) 03/16/2017   PLT 403 (H) 07/12/2017   PLT 410 (H) 02/11/2017   LYMPHOPCT 27 07/10/2017   MONOPCT 11 07/10/2017   EOSPCT 1 07/10/2017   BASOPCT 0 07/10/2017    CMP:  Lab Results  Component Value Date   NA 139 07/12/2017   NA 143 04/16/2017   K 4.3 07/12/2017   CL 109 07/12/2017   CO2 22 07/12/2017   BUN 24 (H) 07/12/2017   BUN 14 04/16/2017   CREATININE 1.58 (H) 07/12/2017   PROT 7.1 07/09/2017   PROT 6.2 02/11/2017   ALBUMIN 2.7 (L) 07/09/2017   ALBUMIN 2.8 (L) 02/11/2017   BILITOT 0.6 07/09/2017   BILITOT <0.2 02/11/2017   ALKPHOS 71 07/09/2017   AST 26 07/09/2017   ALT 8 (L) 07/09/2017  .  Total Discharge time is about 33 minutes  Roxan Hockey M.D on 07/12/2017 at 12:52 PM  [facility]   Office  [phone]  Voice Recognition Viviann Spare dictation system was used to create this note, attempts have been made to correct errors. Please contact the author with questions and/or clarifications.

## 2017-07-12 NOTE — Progress Notes (Addendum)
2:00 pm CSW received calls back from patient's family. Patient's niece, Ned Clines, will pick patient up at 5pm and take him back to his ALF. CSW updated unit staff. CSW sent discharge information to facility.  12:02 pm CSW did reach patient's ALF, [facility], by phone and confirmed they are ready to take patient back today if discharged. CSW left messages for multiple family contacts for the patient, including [name], Cherylann Ratel, and [place]. CSW awaiting calls back from family members. CSW to support with discharge back to ALF.  Estanislado Emms, [place]

## 2017-07-12 NOTE — Progress Notes (Signed)
Pharmacy Antibiotic Note  Timothy [name] is a 77 y.o. male admitted on 07/09/2017 with pneumonia, HCAP, influenza  Pharmacy has been consulted for Vancomycin dosing.  Plan:  With improvement in renal function, will increase dose to 750 mg IV q36h   Measure Vanc peak, trough at steady state.  Follow up renal fxn, culture results, and clinical course.  MD, Consider ordering MRSA PCR to guide antibiotic de-escalation.  Continue  oseltamivir 30 mg once daily for influenza treatment while CrCl < 30 ml/min  Continue  Cefepime 1g IV q24h     Height: 5\' 5"  (165.1 cm) Weight: 119 lb 14.9 oz (54.4 kg) IBW/kg (Calculated) : 61.5  Temp (24hrs), Avg:97.9 F (36.6 C), Min:97.6 F (36.4 C), Max:98.2 F (36.8 C)  Recent Labs  Lab 07/09/17 1002 07/09/17 1011 07/10/17 0324 07/12/17 0449  WBC 14.7*  --  8.9 10.1  CREATININE 2.21*  --  1.84* 1.58*  LATICACIDVEN  --  0.80  --   --     Estimated Creatinine Clearance: 29.6 mL/min (A) (by C-G formula based on SCr of 1.58 mg/dL (H)).    No Known Allergies  Antimicrobials this admission: 2/21 vancomycin >>  2/21 cefepime >>  2/21 oseltamivir >>  Dose adjustments this admission:   Microbiology results: 2/21 BCx: ngtd 2/21 Sputum: sent 2/21 Influenza A: positive: negative 2/21 Strep pneumo Ur Ag: negative   Thank you for allowing pharmacy to be a part of this patient's care.   Royetta Asal, PharmD, BCPS Pager [phone] 07/12/2017 10:21 AM

## 2017-07-12 NOTE — Progress Notes (Signed)
CSW received call from patient's niece, Ned Clines [phone]) - she is leaving work now at [employer] to pick patient up at [facility]. Niece to take patient back to [facility]. CSW faxed discharge paperwork to ALF. CSW signing off.  Estanislado Emms, [place]

## 2017-07-14 ENCOUNTER — Encounter (INDEPENDENT_AMBULATORY_CARE_PROVIDER_SITE_OTHER): Payer: Self-pay

## 2017-07-14 ENCOUNTER — Ambulatory Visit (HOSPITAL_COMMUNITY): Payer: Medicare Other | Attending: Cardiovascular Disease

## 2017-07-14 ENCOUNTER — Encounter (HOSPITAL_COMMUNITY): Payer: Self-pay | Admitting: *Deleted

## 2017-07-14 VITALS — Ht 61.0 in | Wt 88.0 lb

## 2017-07-14 DIAGNOSIS — R0609 Other forms of dyspnea: Secondary | ICD-10-CM | POA: Insufficient documentation

## 2017-07-14 DIAGNOSIS — R7989 Other specified abnormal findings of blood chemistry: Secondary | ICD-10-CM

## 2017-07-14 DIAGNOSIS — R748 Abnormal levels of other serum enzymes: Secondary | ICD-10-CM

## 2017-07-14 DIAGNOSIS — I2510 Atherosclerotic heart disease of native coronary artery without angina pectoris: Secondary | ICD-10-CM | POA: Diagnosis not present

## 2017-07-14 DIAGNOSIS — Z01818 Encounter for other preprocedural examination: Secondary | ICD-10-CM | POA: Diagnosis not present

## 2017-07-14 DIAGNOSIS — R778 Other specified abnormalities of plasma proteins: Secondary | ICD-10-CM

## 2017-07-14 DIAGNOSIS — E119 Type 2 diabetes mellitus without complications: Secondary | ICD-10-CM | POA: Insufficient documentation

## 2017-07-14 DIAGNOSIS — I10 Essential (primary) hypertension: Secondary | ICD-10-CM | POA: Insufficient documentation

## 2017-07-14 DIAGNOSIS — R112 Nausea with vomiting, unspecified: Secondary | ICD-10-CM

## 2017-07-14 LAB — MYOCARDIAL PERFUSION IMAGING
CHL CUP RESTING HR STRESS: 67 {beats}/min
LHR: 0.26
LVDIAVOL: 109 mL (ref 62–150)
LVSYSVOL: 59 mL
NUC STRESS TID: 0.97
Peak HR: 77 {beats}/min
SDS: 0
SRS: 4
SSS: 4

## 2017-07-14 LAB — CULTURE, BLOOD (ROUTINE X 2)
CULTURE: NO GROWTH
CULTURE: NO GROWTH
Special Requests: ADEQUATE
Special Requests: ADEQUATE

## 2017-07-14 MED ORDER — TECHNETIUM TC 99M TETROFOSMIN IV KIT
10.9000 | PACK | Freq: Once | INTRAVENOUS | Status: AC | PRN
  Administered 2017-07-14: 10.9 via INTRAVENOUS
  Filled 2017-07-14: qty 11

## 2017-07-14 MED ORDER — TECHNETIUM TC 99M TETROFOSMIN IV KIT
32.0000 | PACK | Freq: Once | INTRAVENOUS | Status: AC | PRN
  Administered 2017-07-14: 32 via INTRAVENOUS
  Filled 2017-07-14: qty 32

## 2017-07-14 MED ORDER — REGADENOSON 0.4 MG/5ML IV SOLN
0.4000 mg | Freq: Once | INTRAVENOUS | Status: AC
  Administered 2017-07-14: 0.4 mg via INTRAVENOUS

## 2017-07-14 MED ORDER — AMINOPHYLLINE 25 MG/ML IV SOLN
150.0000 mg | Freq: Once | INTRAVENOUS | Status: AC
  Administered 2017-07-14: 150 mg via INTRAVENOUS

## 2017-07-22 ENCOUNTER — Ambulatory Visit: Payer: Medicare Other | Admitting: Physician Assistant

## 2017-07-24 ENCOUNTER — Ambulatory Visit (INDEPENDENT_AMBULATORY_CARE_PROVIDER_SITE_OTHER): Payer: Medicare Other | Admitting: Physician Assistant

## 2017-07-24 ENCOUNTER — Encounter (INDEPENDENT_AMBULATORY_CARE_PROVIDER_SITE_OTHER): Payer: Self-pay | Admitting: Physician Assistant

## 2017-07-24 ENCOUNTER — Other Ambulatory Visit: Payer: Self-pay

## 2017-07-24 VITALS — BP 138/68 | HR 70 | Temp 97.7°F | Ht 61.0 in | Wt 91.6 lb

## 2017-07-24 DIAGNOSIS — E119 Type 2 diabetes mellitus without complications: Secondary | ICD-10-CM | POA: Diagnosis not present

## 2017-07-24 DIAGNOSIS — R079 Chest pain, unspecified: Secondary | ICD-10-CM

## 2017-07-24 DIAGNOSIS — L819 Disorder of pigmentation, unspecified: Secondary | ICD-10-CM

## 2017-07-24 DIAGNOSIS — I10 Essential (primary) hypertension: Secondary | ICD-10-CM | POA: Diagnosis not present

## 2017-07-24 DIAGNOSIS — D509 Iron deficiency anemia, unspecified: Secondary | ICD-10-CM | POA: Diagnosis not present

## 2017-07-24 DIAGNOSIS — Z760 Encounter for issue of repeat prescription: Secondary | ICD-10-CM

## 2017-07-24 DIAGNOSIS — K089 Disorder of teeth and supporting structures, unspecified: Secondary | ICD-10-CM | POA: Diagnosis not present

## 2017-07-24 DIAGNOSIS — R208 Other disturbances of skin sensation: Secondary | ICD-10-CM

## 2017-07-24 DIAGNOSIS — H548 Legal blindness, as defined in USA: Secondary | ICD-10-CM | POA: Diagnosis not present

## 2017-07-24 DIAGNOSIS — R4189 Other symptoms and signs involving cognitive functions and awareness: Secondary | ICD-10-CM

## 2017-07-24 LAB — TROPONIN I: Troponin I: 0.02 ng/mL (ref 0.00–0.04)

## 2017-07-24 MED ORDER — FERROUS GLUCONATE 324 (38 FE) MG PO TABS: 324.0000 mg | ORAL_TABLET | Freq: Every day | ORAL | 5 refills | Status: DC

## 2017-07-24 MED ORDER — CARVEDILOL 6.25 MG PO TABS: 6.2500 mg | ORAL_TABLET | Freq: Two times a day (BID) | ORAL | 5 refills | Status: DC

## 2017-07-24 MED ORDER — GLIMEPIRIDE 4 MG PO TABS: 4.0000 mg | ORAL_TABLET | Freq: Every day | ORAL | 3 refills | Status: DC

## 2017-07-24 MED ORDER — CARVEDILOL 6.25 MG PO TABS: 6.2500 mg | ORAL_TABLET | Freq: Two times a day (BID) | ORAL | 3 refills | Status: DC

## 2017-07-24 MED ORDER — FOLIC ACID 1 MG PO TABS: 1.0000 mg | ORAL_TABLET | Freq: Every day | ORAL | 5 refills | Status: DC

## 2017-07-24 MED ORDER — ASPIRIN 81 MG PO CHEW: 81.0000 mg | CHEWABLE_TABLET | Freq: Every day | ORAL | Status: DC

## 2017-07-24 MED ORDER — ADULT MULTIVITAMIN W/MINERALS CH: 1.0000 | ORAL_TABLET | Freq: Every day | ORAL | 5 refills | Status: DC

## 2017-07-24 MED ORDER — AMLODIPINE BESYLATE 10 MG PO TABS: 10.0000 mg | ORAL_TABLET | Freq: Every day | ORAL | 3 refills | Status: DC

## 2017-07-24 MED ORDER — ADULT MULTIVITAMIN W/MINERALS CH: 1.0000 | ORAL_TABLET | Freq: Every day | ORAL | 2 refills | Status: DC

## 2017-07-24 MED ORDER — ALBUTEROL SULFATE HFA 108 (90 BASE) MCG/ACT IN AERS: 2.0000 | INHALATION_SPRAY | Freq: Four times a day (QID) | RESPIRATORY_TRACT | 2 refills | Status: DC

## 2017-07-24 MED ORDER — AMLODIPINE BESYLATE 10 MG PO TABS: 10.0000 mg | ORAL_TABLET | Freq: Every day | ORAL | 5 refills | Status: DC

## 2017-07-24 NOTE — Progress Notes (Signed)
Subjective:  Patient ID: Timothy [name], male    DOB: Jun 26, 1940  Age: 77 y.o. MRN: [id number]  CC: HTN and DM  HPI Timothy [name] is a 77 y.o. male with a medical history of HTN, DM2, CKD3, blindness, and partial hearing loss presents as a new patient for management of HTN and DM2. Last A1c 7.1% one month ago. He is presently residing in a skilled nursing facility at [place] [facility]. Only complaint is of occasional, non-radiating, left sided chest pain. Last felt two days ago. Does not know if there is association to food. Pain lasts momentarily. Unable to reproduce through torso or limb movements. No associated symptoms when chest pain occurs.     *Pt is extremely poor historian. Different dialects seem to be spoken among pt, translator, and case Insurance underwriter.    Outpatient Medications Prior to Visit  Medication Sig Dispense Refill  . albuterol (PROVENTIL HFA;VENTOLIN HFA) 108 (90 Base) MCG/ACT inhaler Inhale 2 puffs into the lungs 4 (four) times daily for 10 days. 1 Inhaler 2  . amLODipine (NORVASC) 10 MG tablet Take 1 tablet (10 mg total) by mouth daily. 30 tablet 3  . aspirin 81 MG chewable tablet Chew 1 tablet (81 mg total) by mouth daily.    . carvedilol (COREG) 6.25 MG tablet Take 1 tablet (6.25 mg total) by mouth 2 (two) times daily with a meal. 60 tablet 3  . ferrous gluconate (FERGON) 324 MG tablet Take 1 tablet (324 mg total) by mouth daily with breakfast.  3  . folic acid (FOLVITE) 1 MG tablet Take 1 tablet (1 mg total) by mouth daily.    . metFORMIN (GLUCOPHAGE) 500 MG tablet 1/2 tab by mouth twice daily 30 tablet 11  . Multiple Vitamin (MULTIVITAMIN WITH MINERALS) TABS tablet Take 1 tablet by mouth daily. 30 tablet 2  . ofloxacin (OCUFLOX) 0.3 % ophthalmic solution Place 1 drop into the left eye 4 (four) times daily. 5 mL 0  . prednisoLONE acetate (PRED FORTE) 1 % ophthalmic suspension Place 1 drop into the left eye 4 (four) times daily. 5 mL 0  . predniSONE (DELTASONE) 20 MG  tablet Take 1 tablet (20 mg total) by mouth daily with breakfast. 5 tablet 0  . thiamine 100 MG tablet Take 1 tablet (100 mg total) by mouth daily. (Patient taking differently: Take 100 mg by mouth daily. (0800))     No facility-administered medications prior to visit.      ROS Review of Systems  Constitutional: Negative for chills, fever and malaise/fatigue.  Eyes: Negative for blurred vision.       Partial vision loss  Respiratory: Negative for shortness of breath.   Cardiovascular: Positive for chest pain. Negative for palpitations.  Gastrointestinal: Negative for abdominal pain and nausea.  Genitourinary: Negative for dysuria and hematuria.  Musculoskeletal: Negative for joint pain and myalgias.  Skin: Negative for rash.  Neurological: Negative for tingling and headaches.  Psychiatric/Behavioral: Negative for depression. The patient is not nervous/anxious.     Objective:   Vitals:   07/24/17 1029  BP: 138/68  Pulse: 70  Temp: 97.7 F (36.5 C)  SpO2: 99%     Physical Exam  Constitutional:  Cachectic, NAD  HENT:  Head: Normocephalic and atraumatic.  Eyes: No scleral icterus.  Neck: Normal range of motion. Neck supple. No thyromegaly present.  Cardiovascular: Normal rate, regular rhythm and normal heart sounds.  Pulmonary/Chest: Effort normal and breath sounds normal.  Abdominal: Soft. Bowel sounds are normal. There  is no tenderness.  Musculoskeletal: He exhibits no edema.  Skin: Skin is warm and dry. No rash noted. No erythema. No pallor.  Psychiatric: He has a normal mood and affect. His behavior is normal. Thought content normal.  Vitals reviewed.    Assessment & Plan:    1. Hypertension, unspecified type - Comprehensive metabolic panel - CBC with Differential - Lipid panel  - Refill aspirin 81 MG chewable tablet; Chew 1 tablet (81 mg total) by mouth daily. - Refill amLODipine (NORVASC) 10 MG tablet; Take 1 tablet (10 mg total) by mouth daily.  Dispense:  30 tablet; Refill: 5 - Refill carvedilol (COREG) 6.25 MG tablet; Take 1 tablet (6.25 mg total) by mouth 2 (two) times daily with a meal.  Dispense: 60 tablet; Refill: 5  2. Type 2 diabetes mellitus without complication, without long-term current use of insulin (HCC) - Stop Metformin due to CKD - Begin glimepiride (AMARYL) 4 MG tablet; Take 1 tablet (4 mg total) by mouth daily before breakfast.  Dispense: 30 tablet; Refill: 3  3. Encounter for diabetic foot exam ([place]) - Failed monofilament testing. Referral to Neurology has been made.   4. Hyperpigmented skin lesion - RPR - HIV antibody (with reflex)  5. Chest pain, unspecified type - EKG 12-Lead with no acute changes - Troponin I ordered STAT - DG Chest 2 View; Future  6. Iron deficiency anemia, unspecified iron deficiency anemia type - Refill Ferrous gluconate  7. Poor dentition - Ambulatory referral to Dentistry  8. Cognitive impairment - Ambulatory referral to Neurology - MMSE score 9. However the score is likely lower due to illiteracy, blindness, and inability to speak [place].   9. Loss of protective sensation of skin of foot - Ambulatory referral to Neurology  10. Medication refill - Multiple Vitamin (MULTIVITAMIN WITH MINERALS) TABS tablet; Take 1 tablet by mouth daily.  Dispense: 30 tablet; Refill: 5 - folic acid (FOLVITE) 1 MG tablet; Take 1 tablet (1 mg total) by mouth daily.  Dispense: 30 tablet; Refill: 5 - albuterol (PROVENTIL HFA;VENTOLIN HFA) 108 (90 Base) MCG/ACT inhaler; Inhale 2 puffs into the lungs 4 (four) times daily for 10 days.  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 Posey Pronto, ophthalmology.     Meds ordered this encounter  Medications  . DISCONTD: carvedilol (COREG) 6.25 MG tablet    Sig: Take 1 tablet (6.25 mg total) by mouth 2 (two) times daily with a meal.    Dispense:  60 tablet    Refill:  3    Order Specific Question:   Supervising  Provider    Answer:   Tresa Garter [id number]  . DISCONTD: amLODipine (NORVASC) 10 MG tablet    Sig: Take 1 tablet (10 mg total) by mouth daily.    Dispense:  30 tablet    Refill:  3    Order Specific Question:   Supervising Provider    Answer:   Tresa Garter [id number]  . aspirin 81 MG chewable tablet    Sig: Chew 1 tablet (81 mg total) by mouth daily.    Order Specific Question:   Supervising Provider    Answer:   Tresa Garter [id number]  . DISCONTD: Multiple Vitamin (MULTIVITAMIN WITH MINERALS) TABS tablet    Sig: Take 1 tablet by mouth daily.    Dispense:  30 tablet    Refill:  2    If you continue to use alcohol    Order  Specific Question:   Supervising Provider    Answer:   Tresa Garter [id number]  . glimepiride (AMARYL) 4 MG tablet    Sig: Take 1 tablet (4 mg total) by mouth daily before breakfast.    Dispense:  30 tablet    Refill:  3    Order Specific Question:   Supervising Provider    Answer:   Tresa Garter [id number]  . amLODipine (NORVASC) 10 MG tablet    Sig: Take 1 tablet (10 mg total) by mouth daily.    Dispense:  30 tablet    Refill:  5    Order Specific Question:   Supervising Provider    Answer:   Tresa Garter [id number]  . Multiple Vitamin (MULTIVITAMIN WITH MINERALS) TABS tablet    Sig: Take 1 tablet by mouth daily.    Dispense:  30 tablet    Refill:  5    If you continue to use alcohol    Order Specific Question:   Supervising Provider    Answer:   Tresa Garter [id number]  . folic acid (FOLVITE) 1 MG tablet    Sig: Take 1 tablet (1 mg total) by mouth daily.    Dispense:  30 tablet    Refill:  5    If you continue to use alcohol    Order Specific Question:   Supervising Provider    Answer:   Tresa Garter [id number]  . carvedilol (COREG) 6.25 MG tablet    Sig: Take 1 tablet (6.25 mg total) by mouth 2 (two) times daily with a meal.    Dispense:  60 tablet    Refill:  5    Order Specific  Question:   Supervising Provider    Answer:   Tresa Garter [id number]  . albuterol (PROVENTIL HFA;VENTOLIN HFA) 108 (90 Base) MCG/ACT inhaler    Sig: Inhale 2 puffs into the lungs 4 (four) times daily for 10 days.    Dispense:  1 Inhaler    Refill:  2    Order Specific Question:   Supervising Provider    Answer:   Tresa Garter [id number]  . ferrous gluconate (FERGON) 324 MG tablet    Sig: Take 1 tablet (324 mg total) by mouth daily with breakfast.    Dispense:  30 tablet    Refill:  5    Order Specific Question:   Supervising Provider    Answer:   Tresa Garter [[id number]]    Follow-up: 8 weeks HTN and DM  [name] PA

## 2017-07-24 NOTE — Patient Instructions (Addendum)
You have an abnormal EKG but similar to previous EKGs. I advise that you alert someone at your skilled nursing facility if you should experience worsening chest pain.   Please work with your current ophthalmologist in regards to your vision and eye care.   Hypertension Hypertension is another name for high blood pressure. High blood pressure forces your heart to work harder to pump blood. This can cause problems over time. There are two numbers in a blood pressure reading. There is a top number (systolic) over a bottom number (diastolic). It is best to have a blood pressure below 120/80. Healthy choices can help lower your blood pressure. You may need medicine to help lower your blood pressure if:  Your blood pressure cannot be lowered with healthy choices.  Your blood pressure is higher than 130/80.  Follow these instructions at home: Eating and drinking  If directed, follow the DASH eating plan. This diet includes: ? Filling half of your plate at each meal with fruits and vegetables. ? Filling one quarter of your plate at each meal with whole grains. Whole grains include whole wheat pasta, brown rice, and whole grain bread. ? Eating or drinking low-fat dairy products, such as skim milk or low-fat yogurt. ? Filling one quarter of your plate at each meal with low-fat (lean) proteins. Low-fat proteins include fish, skinless chicken, eggs, beans, and tofu. ? Avoiding fatty meat, cured and processed meat, or chicken with skin. ? Avoiding premade or processed food.  Eat less than 1,500 mg of salt (sodium) a day.  Limit alcohol use to no more than 1 drink a day for nonpregnant women and 2 drinks a day for men. One drink equals 12 oz of beer, 5 oz of wine, or 1 oz of hard liquor. Lifestyle  Work with your doctor to stay at a healthy weight or to lose weight. Ask your doctor what the best weight is for you.  Get at least 30 minutes of exercise that causes your heart to beat faster (aerobic  exercise) most days of the week. This may include walking, swimming, or biking.  Get at least 30 minutes of exercise that strengthens your muscles (resistance exercise) at least 3 days a week. This may include lifting weights or pilates.  Do not use any products that contain nicotine or tobacco. This includes cigarettes and e-cigarettes. If you need help quitting, ask your doctor.  Check your blood pressure at home as told by your doctor.  Keep all follow-up visits as told by your doctor. This is important. Medicines  Take over-the-counter and prescription medicines only as told by your doctor. Follow directions carefully.  Do not skip doses of blood pressure medicine. The medicine does not work as well if you skip doses. Skipping doses also puts you at risk for problems.  Ask your doctor about side effects or reactions to medicines that you should watch for. Contact a doctor if:  You think you are having a reaction to the medicine you are taking.  You have headaches that keep coming back (recurring).  You feel dizzy.  You have swelling in your ankles.  You have trouble with your vision. Get help right away if:  You get a very bad headache.  You start to feel confused.  You feel weak or numb.  You feel faint.  You get very bad pain in your: ? Chest. ? Belly (abdomen).  You throw up (vomit) more than once.  You have trouble breathing. Summary  Hypertension is  another name for high blood pressure.  Making healthy choices can help lower blood pressure. If your blood pressure cannot be controlled with healthy choices, you may need to take medicine. This information is not intended to replace advice given to you by your health care provider. Make sure you discuss any questions you have with your health care provider. Document Released: 10/22/2007 Document Revised: 04/02/2016 Document Reviewed: 04/02/2016 Elsevier Interactive Patient Education  [employer].

## 2017-07-25 LAB — COMPREHENSIVE METABOLIC PANEL
A/G RATIO: 1 — AB (ref 1.2–2.2)
ALBUMIN: 3.3 g/dL — AB (ref 3.5–4.8)
ALT: 16 IU/L (ref 0–44)
AST: 33 IU/L (ref 0–40)
Alkaline Phosphatase: 111 IU/L (ref 39–117)
BUN/Creatinine Ratio: 19 (ref 10–24)
BUN: 34 mg/dL — ABNORMAL HIGH (ref 8–27)
Bilirubin Total: <0.2 mg/dL (ref 0.0–1.2)
CALCIUM: 9.2 mg/dL (ref 8.6–10.2)
CO2: 20 mmol/L (ref 20–29)
Chloride: 106 mmol/L (ref 96–106)
Creatinine, Ser: 1.8 mg/dL — ABNORMAL HIGH (ref 0.76–1.27)
GFR calc non Af Amer: 35 mL/min/{1.73_m2} — ABNORMAL LOW (ref >59)
GFR, EST AFRICAN AMERICAN: 41 mL/min/{1.73_m2} — AB (ref >59)
Globulin, Total: 3.3 g/dL (ref 1.5–4.5)
Glucose: 119 mg/dL — ABNORMAL HIGH (ref 65–99)
POTASSIUM: 6.3 mmol/L — AB (ref 3.5–5.2)
Sodium: 138 mmol/L (ref 134–144)
Total Protein: 6.6 g/dL (ref 6.0–8.5)

## 2017-07-25 LAB — CBC WITH DIFFERENTIAL/PLATELET
BASOS: 0 %
Basophils Absolute: 0 10*3/uL (ref 0.0–0.2)
EOS (ABSOLUTE): 0.3 10*3/uL (ref 0.0–0.4)
Eos: 3 %
HEMOGLOBIN: 10.1 g/dL — AB (ref 13.0–17.7)
Hematocrit: 32.3 % — ABNORMAL LOW (ref 37.5–51.0)
IMMATURE GRANS (ABS): 0 10*3/uL (ref 0.0–0.1)
Immature Granulocytes: 0 %
LYMPHS: 17 %
Lymphocytes Absolute: 1.9 10*3/uL (ref 0.7–3.1)
MCH: 23.7 pg — AB (ref 26.6–33.0)
MCHC: 31.3 g/dL — ABNORMAL LOW (ref 31.5–35.7)
MCV: 76 fL — AB (ref 79–97)
MONOCYTES: 10 %
Monocytes Absolute: 1.1 10*3/uL — ABNORMAL HIGH (ref 0.1–0.9)
NEUTROS ABS: 8.1 10*3/uL — AB (ref 1.4–7.0)
Neutrophils: 70 %
Platelets: 450 10*3/uL — ABNORMAL HIGH (ref 150–379)
RBC: 4.26 x10E6/uL (ref 4.14–5.80)
RDW: 16 % — ABNORMAL HIGH (ref 12.3–15.4)
WBC: 11.5 10*3/uL — ABNORMAL HIGH (ref 3.4–10.8)

## 2017-07-25 LAB — LIPID PANEL
CHOLESTEROL TOTAL: 199 mg/dL (ref 100–199)
Chol/HDL Ratio: 3.2 ratio (ref 0.0–5.0)
HDL: 63 mg/dL (ref >39)
LDL Calculated: 90 mg/dL (ref 0–99)
TRIGLYCERIDES: 230 mg/dL — AB (ref 0–149)
VLDL Cholesterol Cal: 46 mg/dL — ABNORMAL HIGH (ref 5–40)

## 2017-07-25 LAB — RPR: RPR: NONREACTIVE

## 2017-07-25 LAB — HIV ANTIBODY (ROUTINE TESTING W REFLEX): HIV Screen 4th Generation wRfx: NONREACTIVE

## 2017-07-27 ENCOUNTER — Telehealth (INDEPENDENT_AMBULATORY_CARE_PROVIDER_SITE_OTHER): Payer: Self-pay

## 2017-07-27 ENCOUNTER — Other Ambulatory Visit (INDEPENDENT_AMBULATORY_CARE_PROVIDER_SITE_OTHER): Payer: Self-pay | Admitting: Physician Assistant

## 2017-07-27 MED ORDER — SODIUM POLYSTYRENE SULFONATE 15 GM/60ML PO SUSP
60.0000 g | Freq: Every day | ORAL | 0 refills | Status: AC
Start: 2017-07-27 — End: 2017-07-29

## 2017-07-27 NOTE — Telephone Encounter (Signed)
Patients case manager called and was provided with lab results of stable CKD, potassium to high. Pick up kayexalate from pharmacy and begin taking today. Return on Thursday or Friday for potassium recheck. Take patient to ED if he complains of chest pains or palpitations. Case manager repeated all this information back and expressed understanding. [name], CMA

## 2017-07-27 NOTE — Telephone Encounter (Signed)
-----   Message from [name], PA-C sent at 07/27/2017  2:01 PM EDT ----- CKD stable. Potassium too high. I have sent Kayexalate to [place]. He must begin taking this today. He needs to return on Thursday or Friday to have potassium rechecked. If he has chest pain or palpitations, have him go to the ED.

## 2017-08-13 ENCOUNTER — Other Ambulatory Visit (INDEPENDENT_AMBULATORY_CARE_PROVIDER_SITE_OTHER): Payer: Medicare Other

## 2017-08-13 DIAGNOSIS — E875 Hyperkalemia: Secondary | ICD-10-CM

## 2017-08-13 NOTE — Addendum Note (Signed)
Addended by: [name] on: 08/13/2017 10:46 AM   Modules accepted: Orders

## 2017-08-14 ENCOUNTER — Other Ambulatory Visit (INDEPENDENT_AMBULATORY_CARE_PROVIDER_SITE_OTHER): Payer: Self-pay

## 2017-08-14 ENCOUNTER — Other Ambulatory Visit (INDEPENDENT_AMBULATORY_CARE_PROVIDER_SITE_OTHER): Payer: Self-pay | Admitting: Physician Assistant

## 2017-08-14 ENCOUNTER — Telehealth (INDEPENDENT_AMBULATORY_CARE_PROVIDER_SITE_OTHER): Payer: Self-pay

## 2017-08-14 DIAGNOSIS — E875 Hyperkalemia: Secondary | ICD-10-CM

## 2017-08-14 LAB — BASIC METABOLIC PANEL
BUN / CREAT RATIO: 19 (ref 10–24)
BUN: 40 mg/dL — AB (ref 8–27)
CO2: 19 mmol/L — AB (ref 20–29)
Calcium: 9.6 mg/dL (ref 8.6–10.2)
Chloride: 105 mmol/L (ref 96–106)
Creatinine, Ser: 2.1 mg/dL — ABNORMAL HIGH (ref 0.76–1.27)
GFR calc Af Amer: 34 mL/min/{1.73_m2} — ABNORMAL LOW (ref >59)
GFR calc non Af Amer: 29 mL/min/{1.73_m2} — ABNORMAL LOW (ref >59)
Glucose: 142 mg/dL — ABNORMAL HIGH (ref 65–99)
Potassium: 6.1 mmol/L — ABNORMAL HIGH (ref 3.5–5.2)
SODIUM: 139 mmol/L (ref 134–144)

## 2017-08-14 MED ORDER — SODIUM POLYSTYRENE SULFONATE 15 GM/60ML PO SUSP
60.0000 g | Freq: Once | ORAL | 0 refills | Status: AC
Start: 2017-08-14 — End: 2017-08-14

## 2017-08-14 MED ORDER — SODIUM POLYSTYRENE SULFONATE 15 GM/60ML PO SUSP: 15.0000 g | Freq: Once | ORAL | 0 refills | Status: DC

## 2017-08-14 MED ORDER — SODIUM POLYSTYRENE SULFONATE 15 GM/60ML PO SUSP: 60.0000 g | Freq: Once | ORAL | 0 refills | Status: DC

## 2017-08-14 NOTE — Telephone Encounter (Signed)
Patient needs Rx for kayexalate. He is in an assisted living facility and they do not pick up prescriptions from outside pharmacies. Please send to [facility]. It is already a pharmacy option in patients chart. [name], CMA

## 2017-08-14 NOTE — Telephone Encounter (Signed)
-----   Message from [name], PA-C sent at 08/14/2017  8:27 AM EDT ----- Pt's creatinine is rising, we will monitor. Please make sure he has an appointment and remind him of when he needs to return.

## 2017-08-14 NOTE — Telephone Encounter (Signed)
Patient care manager at assisted living facility provided with results and will ensure that patient comes to his follow up appt on 4/8 at 10:50. [name], CMA

## 2017-08-17 NOTE — Telephone Encounter (Signed)
This has already been sent out last Friday to the [facility].

## 2017-08-18 ENCOUNTER — Encounter: Payer: Self-pay | Admitting: Diagnostic Neuroimaging

## 2017-08-18 ENCOUNTER — Ambulatory Visit: Payer: Self-pay | Admitting: Diagnostic Neuroimaging

## 2017-08-18 ENCOUNTER — Telehealth: Payer: Self-pay | Admitting: *Deleted

## 2017-08-18 NOTE — Telephone Encounter (Signed)
Patient was no show for new patient appointment today. 

## 2017-08-20 ENCOUNTER — Telehealth (INDEPENDENT_AMBULATORY_CARE_PROVIDER_SITE_OTHER): Payer: Self-pay | Admitting: Physician Assistant

## 2017-08-20 NOTE — Telephone Encounter (Signed)
Please sent the prescription to   [facility] [address], [place], [place] [address] Per the social worker, please follow up

## 2017-08-20 NOTE — Telephone Encounter (Signed)
Social worker call since they still waiting for the Kayexalate to [facility] Since it was not sent to the pharmacy yet, please sent it to

## 2017-08-21 ENCOUNTER — Other Ambulatory Visit (INDEPENDENT_AMBULATORY_CARE_PROVIDER_SITE_OTHER): Payer: Self-pay | Admitting: Physician Assistant

## 2017-08-21 DIAGNOSIS — E875 Hyperkalemia: Secondary | ICD-10-CM

## 2017-08-21 MED ORDER — SODIUM POLYSTYRENE SULFONATE 15 GM/60ML PO SUSP: 60.0000 g | Freq: Once | ORAL | 0 refills | Status: AC

## 2017-08-21 NOTE — Telephone Encounter (Signed)
I have resent to [facility].

## 2017-08-21 NOTE — Telephone Encounter (Signed)
Spoke with the pharmacy and they need a new order for the kayexalate. They do not see any order for it on the patients profile.

## 2017-08-24 ENCOUNTER — Ambulatory Visit (INDEPENDENT_AMBULATORY_CARE_PROVIDER_SITE_OTHER): Payer: Medicare Other | Admitting: Physician Assistant

## 2017-08-24 ENCOUNTER — Other Ambulatory Visit: Payer: Self-pay

## 2017-08-24 ENCOUNTER — Encounter (INDEPENDENT_AMBULATORY_CARE_PROVIDER_SITE_OTHER): Payer: Self-pay | Admitting: Physician Assistant

## 2017-08-24 VITALS — BP 161/75 | HR 66 | Temp 98.0°F | Ht 61.0 in | Wt 98.0 lb

## 2017-08-24 DIAGNOSIS — R1033 Periumbilical pain: Secondary | ICD-10-CM | POA: Diagnosis not present

## 2017-08-24 DIAGNOSIS — I10 Essential (primary) hypertension: Secondary | ICD-10-CM | POA: Diagnosis not present

## 2017-08-24 DIAGNOSIS — E875 Hyperkalemia: Secondary | ICD-10-CM | POA: Diagnosis not present

## 2017-08-24 DIAGNOSIS — Z760 Encounter for issue of repeat prescription: Secondary | ICD-10-CM | POA: Diagnosis not present

## 2017-08-24 MED ORDER — ALBUTEROL SULFATE HFA 108 (90 BASE) MCG/ACT IN AERS: 2.0000 | INHALATION_SPRAY | Freq: Four times a day (QID) | RESPIRATORY_TRACT | 2 refills | Status: DC | PRN

## 2017-08-24 MED ORDER — FERROUS GLUCONATE 324 (38 FE) MG PO TABS: 324.0000 mg | ORAL_TABLET | Freq: Every day | ORAL | 5 refills | Status: DC

## 2017-08-24 MED ORDER — ADULT MULTIVITAMIN W/MINERALS CH: 1.0000 | ORAL_TABLET | Freq: Every day | ORAL | 5 refills | Status: DC

## 2017-08-24 MED ORDER — FOLIC ACID 1 MG PO TABS: 1.0000 mg | ORAL_TABLET | Freq: Every day | ORAL | 5 refills | Status: DC

## 2017-08-24 MED ORDER — GLIMEPIRIDE 4 MG PO TABS: 4.0000 mg | ORAL_TABLET | Freq: Every day | ORAL | 5 refills | Status: DC

## 2017-08-24 MED ORDER — AMLODIPINE BESYLATE 10 MG PO TABS: 10.0000 mg | ORAL_TABLET | Freq: Every day | ORAL | 5 refills | Status: DC

## 2017-08-24 MED ORDER — ASPIRIN 81 MG PO CHEW: 81.0000 mg | CHEWABLE_TABLET | Freq: Every day | ORAL | 11 refills | Status: DC

## 2017-08-24 MED ORDER — CARVEDILOL 6.25 MG PO TABS
6.2500 mg | ORAL_TABLET | Freq: Two times a day (BID) | ORAL | 5 refills | Status: DC
Start: 2017-08-24 — End: 2018-02-26

## 2017-08-24 NOTE — Patient Instructions (Addendum)
Patient should not be taking Metformin any longer due to CKD. Please refer to the last page on this summary to reference his currently approved medications. Patient will also need to return to his ophthalmologist for further evaluation of his eyes and the need for Prednisone eye drops.   Hypertension Hypertension is another name for high blood pressure. High blood pressure forces your heart to work harder to pump blood. This can cause problems over time. There are two numbers in a blood pressure reading. There is a top number (systolic) over a bottom number (diastolic). It is best to have a blood pressure below 120/80. Healthy choices can help lower your blood pressure. You may need medicine to help lower your blood pressure if:  Your blood pressure cannot be lowered with healthy choices.  Your blood pressure is higher than 130/80.  Follow these instructions at home: Eating and drinking  If directed, follow the DASH eating plan. This diet includes: ? Filling half of your plate at each meal with fruits and vegetables. ? Filling one quarter of your plate at each meal with whole grains. Whole grains include whole wheat pasta, brown rice, and whole grain bread. ? Eating or drinking low-fat dairy products, such as skim milk or low-fat yogurt. ? Filling one quarter of your plate at each meal with low-fat (lean) proteins. Low-fat proteins include fish, skinless chicken, eggs, beans, and tofu. ? Avoiding fatty meat, cured and processed meat, or chicken with skin. ? Avoiding premade or processed food.  Eat less than 1,500 mg of salt (sodium) a day.  Limit alcohol use to no more than 1 drink a day for nonpregnant women and 2 drinks a day for men. One drink equals 12 oz of beer, 5 oz of wine, or 1 oz of hard liquor. Lifestyle  Work with your doctor to stay at a healthy weight or to lose weight. Ask your doctor what the best weight is for you.  Get at least 30 minutes of exercise that causes your  heart to beat faster (aerobic exercise) most days of the week. This may include walking, swimming, or biking.  Get at least 30 minutes of exercise that strengthens your muscles (resistance exercise) at least 3 days a week. This may include lifting weights or pilates.  Do not use any products that contain nicotine or tobacco. This includes cigarettes and e-cigarettes. If you need help quitting, ask your doctor.  Check your blood pressure at home as told by your doctor.  Keep all follow-up visits as told by your doctor. This is important. Medicines  Take over-the-counter and prescription medicines only as told by your doctor. Follow directions carefully.  Do not skip doses of blood pressure medicine. The medicine does not work as well if you skip doses. Skipping doses also puts you at risk for problems.  Ask your doctor about side effects or reactions to medicines that you should watch for. Contact a doctor if:  You think you are having a reaction to the medicine you are taking.  You have headaches that keep coming back (recurring).  You feel dizzy.  You have swelling in your ankles.  You have trouble with your vision. Get help right away if:  You get a very bad headache.  You start to feel confused.  You feel weak or numb.  You feel faint.  You get very bad pain in your: ? Chest. ? Belly (abdomen).  You throw up (vomit) more than once.  You have trouble breathing.  Summary  Hypertension is another name for high blood pressure.  Making healthy choices can help lower blood pressure. If your blood pressure cannot be controlled with healthy choices, you may need to take medicine. This information is not intended to replace advice given to you by your health care provider. Make sure you discuss any questions you have with your health care provider. Document Released: 10/22/2007 Document Revised: 04/02/2016 Document Reviewed: 04/02/2016 Elsevier Interactive Patient  Education  [employer].

## 2017-08-24 NOTE — Progress Notes (Signed)
Subjective:  Patient ID: Timothy [name], male    DOB: 07/15/1940  Age: 77 y.o. MRN: [id number]  CC: HTN  HPI Timothy [name] is a 77 y.o. male with a medical history of HTN, Hypertriglyceridemia, DM2, CKD3, anemia, blindness, and partial hearing loss presents to f/u on HTN. Last BP 138/68 mmHg one month ago. 171/76 mmHg today. Pt does not know if he is taking his blood pressure medicines as they are dispensed to him at his nursing facility *(see end of note). BP is noted to be elevated today. Only complaint is of pain just above his umbilicus. Otherwise, does not endorse CP, palpitations, SOB, HA, other abdominal pain, f/c/n/v, rash, or GI/GU sxs.   * Very poor historian even with the use of a live interpreter.    Outpatient Medications Prior to Visit  Medication Sig Dispense Refill  . albuterol (PROVENTIL HFA;VENTOLIN HFA) 108 (90 Base) MCG/ACT inhaler Inhale 2 puffs into the lungs 4 (four) times daily for 10 days. 1 Inhaler 2  . amLODipine (NORVASC) 10 MG tablet Take 1 tablet (10 mg total) by mouth daily. 30 tablet 5  . aspirin 81 MG chewable tablet Chew 1 tablet (81 mg total) by mouth daily.    . carvedilol (COREG) 6.25 MG tablet Take 1 tablet (6.25 mg total) by mouth 2 (two) times daily with a meal. 60 tablet 5  . ferrous gluconate (FERGON) 324 MG tablet Take 1 tablet (324 mg total) by mouth daily with breakfast. 30 tablet 5  . folic acid (FOLVITE) 1 MG tablet Take 1 tablet (1 mg total) by mouth daily. 30 tablet 5  . glimepiride (AMARYL) 4 MG tablet Take 1 tablet (4 mg total) by mouth daily before breakfast. 30 tablet 3  . Multiple Vitamin (MULTIVITAMIN WITH MINERALS) TABS tablet Take 1 tablet by mouth daily. 30 tablet 5   No facility-administered medications prior to visit.      ROS Review of Systems  Constitutional: Negative for chills, fever and malaise/fatigue.  Eyes: Negative for blurred vision.  Respiratory: Negative for shortness of breath.   Cardiovascular: Negative for chest pain  and palpitations.  Gastrointestinal: Positive for abdominal pain. Negative for nausea.  Genitourinary: Negative for dysuria and hematuria.  Musculoskeletal: Negative for joint pain and myalgias.  Skin: Negative for rash.  Neurological: Negative for tingling and headaches.  Psychiatric/Behavioral: Negative for depression. The patient is not nervous/anxious.     Objective:  BP (!) 171/76 (BP Location: Right Arm, Patient Position: Sitting, Cuff Size: Small)   Pulse 66   Temp 98 F (36.7 C) (Oral)   Ht 5\' 1"  (1.549 m)   Wt 98 lb (44.5 kg)   SpO2 100%   BMI 18.52 kg/m   BP/Weight 08/24/2017 07/24/2017 7/74/1287  Systolic BP 867 672 094  Diastolic BP 76 68 67  Wt. (Lbs) 98 91.6 -  BMI 18.52 17.31 -      Physical Exam  Constitutional: He is oriented to person, place, and time.  thin, disheveled, NAD, polite  HENT:  Head: Normocephalic and atraumatic.  Poor dentition  Eyes: Conjunctivae are normal. No scleral icterus.  Neck: Normal range of motion. Neck supple. No thyromegaly present.  Cardiovascular: Normal rate, regular rhythm and normal heart sounds.  Pulmonary/Chest: Effort normal and breath sounds normal. No respiratory distress. He has no wheezes.  Abdominal: Soft. Bowel sounds are normal. He exhibits no distension and no mass. There is tenderness (focal tenderness approximately 2-3 cm above the umbilicus). There is no rebound and no  guarding.  Normoactive bowel sounds. No hepatomegaly.  Musculoskeletal: He exhibits no edema.  Neurological: He is alert and oriented to person, place, and time. No cranial nerve deficit.  Skin: Skin is warm and dry. No rash noted. No erythema. No pallor.  Psychiatric: He has a normal mood and affect. His behavior is normal. Thought content normal.  Vitals reviewed.    Assessment & Plan:     1. Essential hypertension - Refill carvedilol (COREG) 6.25 MG tablet; Take 1 tablet (6.25 mg total) by mouth 2 (two) times daily with a meal.   Dispense: 60 tablet; Refill: 5 - Refill aspirin 81 MG chewable tablet; Chew 1 tablet (81 mg total) by mouth daily.  Dispense: 30 tablet; Refill: 11 - Refill amLODipine (NORVASC) 10 MG tablet; Take 1 tablet (10 mg total) by mouth daily.  Dispense: 30 tablet; Refill: 5  2. Hyperkalemia - Basic Metabolic Panel  3. Periumbilical abdominal pain - Lipase - CBC with Differential  4. Medication refill - Refill glimepiride (AMARYL) 4 MG tablet; Take 1 tablet (4 mg total) by mouth daily before breakfast.  Dispense: 30 tablet; Refill: 5 - Refill folic acid (FOLVITE) 1 MG tablet; Take 1 tablet (1 mg total) by mouth daily.  Dispense: 30 tablet; Refill: 5 - Refill albuterol (PROVENTIL HFA;VENTOLIN HFA) 108 (90 Base) MCG/ACT inhaler; Inhale 2 puffs into the lungs every 6 (six) hours as needed for wheezing or shortness of breath.  Dispense: 1 Inhaler; Refill: 2 - Refill Multiple Vitamin (MULTIVITAMIN WITH MINERALS) TABS tablet; Take 1 tablet by mouth daily.  Dispense: 30 tablet; Refill: 5   * Can call Ambulance person at [facility] [place].     Meds ordered this encounter  Medications  . DISCONTD: Multiple Vitamin (MULTIVITAMIN WITH MINERALS) TABS tablet    Sig: Take 1 tablet by mouth daily.    Dispense:  30 tablet    Refill:  5    If you continue to use alcohol    Order Specific Question:   Supervising Provider    Answer:   Tresa Garter [id number]  . glimepiride (AMARYL) 4 MG tablet    Sig: Take 1 tablet (4 mg total) by mouth daily before breakfast.    Dispense:  30 tablet    Refill:  5    Order Specific Question:   Supervising Provider    Answer:   Tresa Garter [id number]  . folic acid (FOLVITE) 1 MG tablet    Sig: Take 1 tablet (1 mg total) by mouth daily.    Dispense:  30 tablet    Refill:  5    If you continue to use alcohol    Order Specific Question:   Supervising Provider    Answer:   Tresa Garter [id number]  . ferrous gluconate (FERGON) 324  MG tablet    Sig: Take 1 tablet (324 mg total) by mouth daily with breakfast.    Dispense:  30 tablet    Refill:  5    Order Specific Question:   Supervising Provider    Answer:   Tresa Garter [id number]  . carvedilol (COREG) 6.25 MG tablet    Sig: Take 1 tablet (6.25 mg total) by mouth 2 (two) times daily with a meal.    Dispense:  60 tablet    Refill:  5    Order Specific Question:   Supervising Provider    Answer:   Tresa Garter [id number]  . aspirin 81 MG chewable tablet  Sig: Chew 1 tablet (81 mg total) by mouth daily.    Dispense:  30 tablet    Refill:  11    Order Specific Question:   Supervising Provider    Answer:   Tresa Garter [id number]  . amLODipine (NORVASC) 10 MG tablet    Sig: Take 1 tablet (10 mg total) by mouth daily.    Dispense:  30 tablet    Refill:  5    Order Specific Question:   Supervising Provider    Answer:   Tresa Garter [id number]  . albuterol (PROVENTIL HFA;VENTOLIN HFA) 108 (90 Base) MCG/ACT inhaler    Sig: Inhale 2 puffs into the lungs every 6 (six) hours as needed for wheezing or shortness of breath.    Dispense:  1 Inhaler    Refill:  2    Order Specific Question:   Supervising Provider    Answer:   Tresa Garter [id number]  . Multiple Vitamin (MULTIVITAMIN WITH MINERALS) TABS tablet    Sig: Take 1 tablet by mouth daily.    Dispense:  30 tablet    Refill:  5    If you continue to use alcohol    Order Specific Question:   Supervising Provider    Answer:   Tresa Garter [[id number]]    Follow-up: Return in about 1 month (around 09/21/2017) for HTN and A1c.   Rorey Bisson Roderic Ovens PA  * Can call Wells Guiles RN at [facility] [place].

## 2017-08-25 LAB — CBC WITH DIFFERENTIAL/PLATELET
Basophils Absolute: 0.1 10*3/uL (ref 0.0–0.2)
Basos: 1 %
EOS (ABSOLUTE): 0.8 10*3/uL — AB (ref 0.0–0.4)
Eos: 7 %
Hematocrit: 32.4 % — ABNORMAL LOW (ref 37.5–51.0)
Hemoglobin: 10.7 g/dL — ABNORMAL LOW (ref 13.0–17.7)
Immature Grans (Abs): 0.1 10*3/uL (ref 0.0–0.1)
Immature Granulocytes: 1 %
LYMPHS ABS: 2.6 10*3/uL (ref 0.7–3.1)
LYMPHS: 24 %
MCH: 24.2 pg — AB (ref 26.6–33.0)
MCHC: 33 g/dL (ref 31.5–35.7)
MCV: 73 fL — ABNORMAL LOW (ref 79–97)
MONOCYTES: 5 %
Monocytes Absolute: 0.6 10*3/uL (ref 0.1–0.9)
NEUTROS ABS: 7 10*3/uL (ref 1.4–7.0)
Neutrophils: 62 %
PLATELETS: 366 10*3/uL (ref 150–379)
RBC: 4.42 x10E6/uL (ref 4.14–5.80)
RDW: 17.1 % — AB (ref 12.3–15.4)
WBC: 11 10*3/uL — AB (ref 3.4–10.8)

## 2017-08-25 LAB — BASIC METABOLIC PANEL
BUN / CREAT RATIO: 26 — AB (ref 10–24)
BUN: 46 mg/dL — AB (ref 8–27)
CO2: 17 mmol/L — ABNORMAL LOW (ref 20–29)
Calcium: 9.7 mg/dL (ref 8.6–10.2)
Chloride: 108 mmol/L — ABNORMAL HIGH (ref 96–106)
Creatinine, Ser: 1.79 mg/dL — ABNORMAL HIGH (ref 0.76–1.27)
GFR calc non Af Amer: 36 mL/min/{1.73_m2} — ABNORMAL LOW (ref >59)
GFR, EST AFRICAN AMERICAN: 41 mL/min/{1.73_m2} — AB (ref >59)
Glucose: 92 mg/dL (ref 65–99)
Potassium: 6 mmol/L — ABNORMAL HIGH (ref 3.5–5.2)
SODIUM: 144 mmol/L (ref 134–144)

## 2017-08-25 LAB — LIPASE: Lipase: 121 U/L — ABNORMAL HIGH (ref 13–78)

## 2017-09-02 ENCOUNTER — Other Ambulatory Visit: Payer: Self-pay | Admitting: *Deleted

## 2017-09-07 ENCOUNTER — Other Ambulatory Visit (INDEPENDENT_AMBULATORY_CARE_PROVIDER_SITE_OTHER): Payer: Self-pay | Admitting: Physician Assistant

## 2017-09-07 DIAGNOSIS — R1033 Periumbilical pain: Secondary | ICD-10-CM

## 2017-09-08 NOTE — Progress Notes (Signed)
Timothy [name] at [facility] aware of appointments scheduled for patient. Attempt to inform Liana about appointment, no answer. Left message on voicemail to call back.

## 2017-09-09 ENCOUNTER — Telehealth: Payer: Self-pay | Admitting: *Deleted

## 2017-09-09 NOTE — Telephone Encounter (Signed)
Appropriate personal aware of patient's appointments on May 3 and May 6. Spoke to [place] with [facility] and Dance movement psychotherapist.

## 2017-09-17 ENCOUNTER — Ambulatory Visit (HOSPITAL_COMMUNITY): Payer: Medicare Other

## 2017-09-17 ENCOUNTER — Ambulatory Visit: Payer: Self-pay | Admitting: Ophthalmology

## 2017-09-17 NOTE — Progress Notes (Signed)
Faxed pre-op instructions to [place] at [facility]. [place] confirmed receipt, reviewed and verbalized understanding of all pre-op instructions. Dr. Gifford Shave, Anesthesia, reviewed pt history; pt to be assessed upon arrival on DOS.

## 2017-09-17 NOTE — Pre-Procedure Instructions (Signed)
Timothy [name]  09/17/2017      [place], [place] - [address] [place] [address] Phone: [phone] Fax: [phone]    Your procedure is scheduled on Friday, Sep 18, 2017  Report to [facility] Admitting at 11:30 A.M.  Call this number if you have problems the morning of surgery:  [phone]   Remember: Follow doctors instructions regarding Aspirin  Do not eat food or drink liquids after midnight.  Take these medicines the morning of surgery with A SIP OF WATER : amLODipine (NORVASC), carvedilol (COREG), if needed: albuterol (PROVENTIL HFA;VENTOLIN HFA)  Inhaler wheezing or shortness of breath ( bring inhaler in with you on day of surgery).  Stop taking vitamins, fish oil and herbal medications. Do not take any NSAIDs ie: Ibuprofen, Advil, Naproxen (Aleve), Motrin, BC and Goody Powder.    How to Manage Your Diabetes Before and After Surgery  Why is it important to control my blood sugar before and after surgery? . Improving blood sugar levels before and after surgery helps healing and can limit problems. . A way of improving blood sugar control is eating a healthy diet by: o  Eating less sugar and carbohydrates o  Increasing activity/exercise o  Talking with your doctor about reaching your blood sugar goals . High blood sugars (greater than 180 mg/dL) can raise your risk of infections and slow your recovery, so you will need to focus on controlling your diabetes during the weeks before surgery. . Make sure that the doctor who takes care of your diabetes knows about your planned surgery including the date and location.  How do I manage my blood sugar before surgery? . Check your blood sugar at least 4 times a day, starting 2 days before surgery, to make sure that the level is not too high or low. o Check your blood sugar the morning of your surgery when you wake up and every 2 hours until you get to the Short  Stay unit. . If your blood sugar is less than 70 mg/dL, you will need to treat for low blood sugar: o Do not take insulin. o Treat a low blood sugar (less than 70 mg/dL) with  cup of clear juice (cranberry or apple), 4 glucose tablets, OR glucose gel. Recheck blood sugar in 15 minutes after treatment (to make sure it is greater than 70 mg/dL). If your blood sugar is not greater than 70 mg/dL on recheck, call [phone] o  for further instructions. . Report your blood sugar to the short stay nurse when you get to Short Stay.  . If you are admitted to the hospital after surgery: o Your blood sugar will be checked by the staff and you will probably be given insulin after surgery (instead of oral diabetes medicines) to make sure you have good blood sugar levels. o The goal for blood sugar control after surgery is 80-180 mg/d  WHAT DO I DO ABOUT MY DIABETES MEDICATION?   Marland Kitchen Do not take oral diabetes medicines (pills) the morning of surgery such as glimepiride (AMARYL)  Reviewed and Endorsed by [facility] Patient Education Committee, August 2015  Do not wear jewelry, make-up or nail polish.  Do not wear lotions, powders, or perfumes, or deodorant.  Do not shave 48 hours prior to surgery.  Men may shave face and neck.  Do not bring valuables to the hospital.  [facility] is not responsible for any belongings or  valuables.  Contacts, dentures or bridgework may not be worn into surgery.  Leave your suitcase in the car.  After surgery it may be brought to your room.  For patients admitted to the hospital, discharge time will be determined by your treatment team.  Patients discharged the day of surgery will not be allowed to drive home.   Please read over the following fact sheets that you were given.

## 2017-09-18 ENCOUNTER — Encounter (HOSPITAL_COMMUNITY)
Admission: RE | Disposition: A | Discharge status: Nursing Home (Includes ICF) | Payer: Self-pay | Source: Ambulatory Visit | Attending: Ophthalmology

## 2017-09-18 ENCOUNTER — Ambulatory Visit (HOSPITAL_COMMUNITY)
Admission: RE | Admit: 2017-09-18 | Discharge: 2017-09-18 | Disposition: A | Discharge status: Nursing Home (Includes ICF) | Payer: Medicare Other | Source: Ambulatory Visit | Attending: Ophthalmology | Admitting: Ophthalmology

## 2017-09-18 ENCOUNTER — Other Ambulatory Visit (INDEPENDENT_AMBULATORY_CARE_PROVIDER_SITE_OTHER): Payer: Medicare Other

## 2017-09-18 ENCOUNTER — Telehealth (INDEPENDENT_AMBULATORY_CARE_PROVIDER_SITE_OTHER): Payer: Self-pay

## 2017-09-18 ENCOUNTER — Ambulatory Visit (HOSPITAL_COMMUNITY): Payer: Medicare Other | Admitting: Anesthesiology

## 2017-09-18 ENCOUNTER — Ambulatory Visit (HOSPITAL_COMMUNITY)
Admission: RE | Admit: 2017-09-18 | Discharge: 2017-09-18 | Disposition: A | Discharge status: Home / Self Care | Payer: Medicare Other | Source: Ambulatory Visit | Attending: Physician Assistant | Admitting: Physician Assistant

## 2017-09-18 ENCOUNTER — Encounter (HOSPITAL_COMMUNITY): Payer: Self-pay | Admitting: *Deleted

## 2017-09-18 DIAGNOSIS — R1033 Periumbilical pain: Secondary | ICD-10-CM

## 2017-09-18 DIAGNOSIS — Z87891 Personal history of nicotine dependence: Secondary | ICD-10-CM | POA: Diagnosis not present

## 2017-09-18 DIAGNOSIS — E113521 Type 2 diabetes mellitus with proliferative diabetic retinopathy with traction retinal detachment involving the macula, right eye: Secondary | ICD-10-CM | POA: Diagnosis not present

## 2017-09-18 DIAGNOSIS — E1136 Type 2 diabetes mellitus with diabetic cataract: Secondary | ICD-10-CM | POA: Diagnosis not present

## 2017-09-18 DIAGNOSIS — I10 Essential (primary) hypertension: Secondary | ICD-10-CM | POA: Diagnosis not present

## 2017-09-18 HISTORY — PX: PARS PLANA VITRECTOMY: SHX2166

## 2017-09-18 LAB — CBC
HCT: 30.6 % — ABNORMAL LOW (ref 39.0–52.0)
Hemoglobin: 9.8 g/dL — ABNORMAL LOW (ref 13.0–17.0)
MCH: 24.3 pg — AB (ref 26.0–34.0)
MCHC: 32 g/dL (ref 30.0–36.0)
MCV: 75.7 fL — AB (ref 78.0–100.0)
Platelets: 320 10*3/uL (ref 150–400)
RBC: 4.04 MIL/uL — ABNORMAL LOW (ref 4.22–5.81)
RDW: 15.5 % (ref 11.5–15.5)
WBC: 13.4 10*3/uL — AB (ref 4.0–10.5)

## 2017-09-18 LAB — COMPREHENSIVE METABOLIC PANEL
ALT: 17 U/L (ref 17–63)
AST: 29 U/L (ref 15–41)
Albumin: 3.5 g/dL (ref 3.5–5.0)
Alkaline Phosphatase: 71 U/L (ref 38–126)
Anion gap: 7 (ref 5–15)
BILIRUBIN TOTAL: 0.4 mg/dL (ref 0.3–1.2)
BUN: 38 mg/dL — AB (ref 6–20)
CO2: 20 mmol/L — ABNORMAL LOW (ref 22–32)
Calcium: 9 mg/dL (ref 8.9–10.3)
Chloride: 112 mmol/L — ABNORMAL HIGH (ref 101–111)
Creatinine, Ser: 1.85 mg/dL — ABNORMAL HIGH (ref 0.61–1.24)
GFR, EST AFRICAN AMERICAN: 39 mL/min — AB (ref >60)
GFR, EST NON AFRICAN AMERICAN: 33 mL/min — AB (ref >60)
Glucose, Bld: 90 mg/dL (ref 65–99)
POTASSIUM: 4.8 mmol/L (ref 3.5–5.1)
Sodium: 139 mmol/L (ref 135–145)
TOTAL PROTEIN: 6.8 g/dL (ref 6.5–8.1)

## 2017-09-18 LAB — HEMOGLOBIN A1C
Hgb A1c MFr Bld: 6.5 % — ABNORMAL HIGH (ref 4.8–5.6)
Mean Plasma Glucose: 139.85 mg/dL

## 2017-09-18 LAB — GLUCOSE, CAPILLARY
Glucose-Capillary: 83 mg/dL (ref 65–99)
Glucose-Capillary: 74 mg/dL (ref 65–99)

## 2017-09-18 SURGERY — PARS PLANA VITRECTOMY WITH 25 GAUGE
Anesthesia: General | Site: Eye | Laterality: Right

## 2017-09-18 MED ORDER — LIDOCAINE HCL (PF) 2 % IJ SOLN
INTRAMUSCULAR | Status: DC | PRN
  Administered 2017-09-18: 2 mL

## 2017-09-18 MED ORDER — TETRACAINE HCL 0.5 % OP SOLN
OPHTHALMIC | Status: AC
  Filled 2017-09-18: qty 4

## 2017-09-18 MED ORDER — LIDOCAINE 2% (20 MG/ML) 5 ML SYRINGE
INTRAMUSCULAR | Status: AC
  Filled 2017-09-18: qty 5

## 2017-09-18 MED ORDER — BSS IO SOLN
INTRAOCULAR | Status: DC | PRN
  Administered 2017-09-18: 15 mL

## 2017-09-18 MED ORDER — ROCURONIUM BROMIDE 100 MG/10ML IV SOLN
INTRAVENOUS | Status: DC | PRN
  Administered 2017-09-18: 40 mg via INTRAVENOUS

## 2017-09-18 MED ORDER — TOBRAMYCIN-DEXAMETHASONE 0.3-0.1 % OP OINT
TOPICAL_OINTMENT | OPHTHALMIC | Status: DC | PRN
  Administered 2017-09-18: 1 via OPHTHALMIC

## 2017-09-18 MED ORDER — MIDAZOLAM HCL 2 MG/2ML IJ SOLN: 0.5000 mg | Freq: Once | INTRAMUSCULAR | Status: DC | PRN

## 2017-09-18 MED ORDER — DEXAMETHASONE SODIUM PHOSPHATE 10 MG/ML IJ SOLN
INTRAMUSCULAR | Status: DC | PRN
  Administered 2017-09-18: 10 mg via INTRAVENOUS

## 2017-09-18 MED ORDER — ROCURONIUM BROMIDE 50 MG/5ML IV SOLN
INTRAVENOUS | Status: AC
  Filled 2017-09-18: qty 1

## 2017-09-18 MED ORDER — BSS PLUS IO SOLN
INTRAOCULAR | Status: AC
  Filled 2017-09-18: qty 500

## 2017-09-18 MED ORDER — PHENYLEPHRINE 40 MCG/ML (10ML) SYRINGE FOR IV PUSH (FOR BLOOD PRESSURE SUPPORT)
PREFILLED_SYRINGE | INTRAVENOUS | Status: AC
  Filled 2017-09-18: qty 10

## 2017-09-18 MED ORDER — TROPICAMIDE 1 % OP SOLN
1.0000 [drp] | OPHTHALMIC | Status: AC | PRN
  Administered 2017-09-18 (×3): 1 [drp] via OPHTHALMIC
  Filled 2017-09-18: qty 15

## 2017-09-18 MED ORDER — CEFAZOLIN SUBCONJUNCTIVAL INJECTION 100 MG/0.5 ML
200.0000 mg | INJECTION | SUBCONJUNCTIVAL | Status: AC
  Administered 2017-09-18: 155 mg via SUBCONJUNCTIVAL
  Filled 2017-09-18: qty 2

## 2017-09-18 MED ORDER — SUGAMMADEX SODIUM 200 MG/2ML IV SOLN
INTRAVENOUS | Status: AC
  Filled 2017-09-18: qty 2

## 2017-09-18 MED ORDER — LIDOCAINE HCL 2 % IJ SOLN
INTRAMUSCULAR | Status: AC
  Filled 2017-09-18: qty 20

## 2017-09-18 MED ORDER — EPINEPHRINE PF 1 MG/ML IJ SOLN
INTRAOCULAR | Status: DC | PRN
  Administered 2017-09-18: 17:00:00

## 2017-09-18 MED ORDER — SUGAMMADEX SODIUM 500 MG/5ML IV SOLN
INTRAVENOUS | Status: AC
  Filled 2017-09-18: qty 5

## 2017-09-18 MED ORDER — PROPARACAINE HCL 0.5 % OP SOLN
1.0000 [drp] | OPHTHALMIC | Status: AC | PRN
  Administered 2017-09-18 (×2): 1 [drp] via OPHTHALMIC

## 2017-09-18 MED ORDER — BUPIVACAINE HCL (PF) 0.75 % IJ SOLN
INTRAMUSCULAR | Status: AC
  Filled 2017-09-18: qty 10

## 2017-09-18 MED ORDER — CARVEDILOL 6.25 MG PO TABS
6.2500 mg | ORAL_TABLET | Freq: Once | ORAL | Status: AC
  Administered 2017-09-18: 6.25 mg via ORAL
  Filled 2017-09-18: qty 1

## 2017-09-18 MED ORDER — SODIUM CHLORIDE 0.9 % IV SOLN
INTRAVENOUS | Status: DC
  Administered 2017-09-18 (×2): via INTRAVENOUS

## 2017-09-18 MED ORDER — OFLOXACIN 0.3 % OP SOLN
1.0000 [drp] | OPHTHALMIC | Status: AC | PRN
  Administered 2017-09-18 (×3): 1 [drp] via OPHTHALMIC
  Filled 2017-09-18: qty 5

## 2017-09-18 MED ORDER — PHENYLEPHRINE HCL 10 % OP SOLN
1.0000 [drp] | OPHTHALMIC | Status: AC | PRN
  Administered 2017-09-18 (×2): 1 [drp] via OPHTHALMIC

## 2017-09-18 MED ORDER — EPINEPHRINE PF 1 MG/ML IJ SOLN
INTRAMUSCULAR | Status: AC
  Filled 2017-09-18: qty 1

## 2017-09-18 MED ORDER — ATROPINE SULFATE 1 % OP SOLN
OPHTHALMIC | Status: AC
  Filled 2017-09-18: qty 5

## 2017-09-18 MED ORDER — PROPARACAINE HCL 0.5 % OP SOLN
1.0000 [drp] | OPHTHALMIC | Status: AC | PRN
  Administered 2017-09-18: 1 [drp] via OPHTHALMIC
  Filled 2017-09-18: qty 15

## 2017-09-18 MED ORDER — DEXAMETHASONE SODIUM PHOSPHATE 10 MG/ML IJ SOLN
INTRAMUSCULAR | Status: DC | PRN
  Administered 2017-09-18: .5 mL via INTRAVENOUS

## 2017-09-18 MED ORDER — FENTANYL CITRATE (PF) 250 MCG/5ML IJ SOLN
INTRAMUSCULAR | Status: AC
  Filled 2017-09-18: qty 5

## 2017-09-18 MED ORDER — FENTANYL CITRATE (PF) 100 MCG/2ML IJ SOLN
INTRAMUSCULAR | Status: DC | PRN
  Administered 2017-09-18: 50 ug via INTRAVENOUS

## 2017-09-18 MED ORDER — PHENYLEPHRINE HCL 2.5 % OP SOLN
1.0000 [drp] | OPHTHALMIC | Status: AC | PRN
  Administered 2017-09-18 (×3): 1 [drp] via OPHTHALMIC
  Filled 2017-09-18: qty 2

## 2017-09-18 MED ORDER — DEXAMETHASONE SODIUM PHOSPHATE 10 MG/ML IJ SOLN
INTRAMUSCULAR | Status: AC
  Filled 2017-09-18: qty 1

## 2017-09-18 MED ORDER — BSS IO SOLN
INTRAOCULAR | Status: AC
  Filled 2017-09-18: qty 15

## 2017-09-18 MED ORDER — PHENYLEPHRINE HCL 10 % OP SOLN
1.0000 [drp] | OPHTHALMIC | Status: AC | PRN
  Administered 2017-09-18: 1 [drp] via OPHTHALMIC
  Filled 2017-09-18: qty 5

## 2017-09-18 MED ORDER — SUGAMMADEX SODIUM 200 MG/2ML IV SOLN
INTRAVENOUS | Status: DC | PRN
  Administered 2017-09-18: 165.2 mg via INTRAVENOUS

## 2017-09-18 MED ORDER — HYPROMELLOSE (GONIOSCOPIC) 2.5 % OP SOLN
OPHTHALMIC | Status: AC
  Filled 2017-09-18: qty 15

## 2017-09-18 MED ORDER — FENTANYL CITRATE (PF) 100 MCG/2ML IJ SOLN
25.0000 ug | INTRAMUSCULAR | Status: DC | PRN
  Administered 2017-09-18: 50 ug via INTRAVENOUS

## 2017-09-18 MED ORDER — CARVEDILOL 3.125 MG PO TABS
ORAL_TABLET | ORAL | Status: AC
  Filled 2017-09-18: qty 2

## 2017-09-18 MED ORDER — TOBRAMYCIN-DEXAMETHASONE 0.3-0.1 % OP OINT
TOPICAL_OINTMENT | OPHTHALMIC | Status: AC
  Filled 2017-09-18: qty 3.5

## 2017-09-18 MED ORDER — ONDANSETRON HCL 4 MG/2ML IJ SOLN
INTRAMUSCULAR | Status: AC
  Filled 2017-09-18: qty 2

## 2017-09-18 MED ORDER — PROPOFOL 10 MG/ML IV BOLUS
INTRAVENOUS | Status: DC | PRN
  Administered 2017-09-18: 150 mg via INTRAVENOUS

## 2017-09-18 MED ORDER — PROMETHAZINE HCL 25 MG/ML IJ SOLN: 6.2500 mg | INTRAMUSCULAR | Status: DC | PRN

## 2017-09-18 MED ORDER — HYALURONIDASE HUMAN 150 UNIT/ML IJ SOLN
150.0000 [IU] | Freq: Once | INTRAMUSCULAR | Status: DC
  Filled 2017-09-18: qty 1

## 2017-09-18 MED ORDER — FENTANYL CITRATE (PF) 100 MCG/2ML IJ SOLN
INTRAMUSCULAR | Status: AC
  Filled 2017-09-18: qty 2

## 2017-09-18 MED ORDER — PROPOFOL 10 MG/ML IV BOLUS
INTRAVENOUS | Status: AC
  Filled 2017-09-18: qty 20

## 2017-09-18 MED ORDER — HYPROMELLOSE (GONIOSCOPIC) 2.5 % OP SOLN
OPHTHALMIC | Status: DC | PRN
  Administered 2017-09-18: 10 [drp] via OPHTHALMIC

## 2017-09-18 MED ORDER — ONDANSETRON HCL 4 MG/2ML IJ SOLN
INTRAMUSCULAR | Status: DC | PRN
  Administered 2017-09-18: 4 mg via INTRAVENOUS

## 2017-09-18 MED ORDER — INDOCYANINE GREEN 25 MG IV SOLR
INTRAVENOUS | Status: AC
  Filled 2017-09-18: qty 25

## 2017-09-18 MED ORDER — MEPERIDINE HCL 50 MG/ML IJ SOLN: 6.2500 mg | INTRAMUSCULAR | Status: DC | PRN

## 2017-09-18 MED ORDER — LIDOCAINE HCL (CARDIAC) PF 100 MG/5ML IV SOSY
PREFILLED_SYRINGE | INTRAVENOUS | Status: DC | PRN
  Administered 2017-09-18: 30 mg via INTRAVENOUS

## 2017-09-18 SURGICAL SUPPLY — 56 items
APPLICATOR COTTON TIP 6IN STRL (MISCELLANEOUS) ×3 IMPLANT
BLADE MVR KNIFE 20G (BLADE) IMPLANT
BLADE STAB KNIFE 15DEG (BLADE) IMPLANT
CABLE BIPOLOR RESECTION CORD (MISCELLANEOUS) ×3 IMPLANT
CANNULA ANT CHAM MAIN (OPHTHALMIC RELATED) IMPLANT
CANNULA DUAL BORE 23G (CANNULA) IMPLANT
CANNULA DUALBORE 25G (CANNULA) IMPLANT
CANNULA VLV SOFT TIP 25GA (OPHTHALMIC) ×3 IMPLANT
CAUTERY EYE LOW TEMP 1300F FIN (OPHTHALMIC RELATED) IMPLANT
CLOSURE STERI-STRIP 1/2X4 (GAUZE/BANDAGES/DRESSINGS) ×1
CLSR STERI-STRIP ANTIMIC 1/2X4 (GAUZE/BANDAGES/DRESSINGS) ×2 IMPLANT
COVER MAYO STAND STRL (DRAPES) ×3 IMPLANT
DRAPE HALF SHEET 40X57 (DRAPES) ×3 IMPLANT
DRAPE INCISE 51X51 W/FILM STRL (DRAPES) IMPLANT
DRAPE RETRACTOR (MISCELLANEOUS) ×3 IMPLANT
ERASER HMR WETFIELD 23G BP (MISCELLANEOUS) IMPLANT
FORCEPS ECKARDT ILM 25G SERR (OPHTHALMIC RELATED) IMPLANT
FORCEPS GRIESHABER ILM 25G A (INSTRUMENTS) ×3 IMPLANT
GAS AUTO FILL CONSTEL (OPHTHALMIC) ×3
GAS AUTO FILL CONSTELLATION (OPHTHALMIC) ×1 IMPLANT
GAS IO SF6 125GM ISPAN CNSTL (MEDICAL GASES) ×1 IMPLANT
GAS TANK CONSTELL (MEDICAL GASES) ×2
GLOVE ECLIPSE 7.5 STRL STRAW (GLOVE) ×3 IMPLANT
GOWN STRL REUS W/ TWL LRG LVL3 (GOWN DISPOSABLE) ×2 IMPLANT
GOWN STRL REUS W/TWL LRG LVL3 (GOWN DISPOSABLE) ×4
KIT BASIN OR (CUSTOM PROCEDURE TRAY) ×3 IMPLANT
KIT TURNOVER KIT B (KITS) ×3 IMPLANT
LENS BIOM SUPER VIEW SET DISP (OPHTHALMIC RELATED) ×3 IMPLANT
MICROPICK 25G (MISCELLANEOUS)
NEEDLE 18GX1X1/2 (RX/OR ONLY) (NEEDLE) ×3 IMPLANT
NEEDLE 25GX 5/8IN NON SAFETY (NEEDLE) ×3 IMPLANT
NEEDLE FILTER BLUNT 18X 1/2SAF (NEEDLE) ×2
NEEDLE FILTER BLUNT 18X1 1/2 (NEEDLE) ×1 IMPLANT
NEEDLE HYPO 25GX1X1/2 BEV (NEEDLE) IMPLANT
NEEDLE HYPO 30X.5 LL (NEEDLE) ×6 IMPLANT
NEEDLE RETROBULBAR 25GX1.5 (NEEDLE) ×3 IMPLANT
NS IRRIG 1000ML POUR BTL (IV SOLUTION) ×3 IMPLANT
PACK FRAGMATOME (OPHTHALMIC) IMPLANT
PACK VITRECTOMY CUSTOM (CUSTOM PROCEDURE TRAY) ×3 IMPLANT
PAD ARMBOARD 7.5X6 YLW CONV (MISCELLANEOUS) ×3 IMPLANT
PAK PIK VITRECTOMY CVS 25GA (OPHTHALMIC) ×3 IMPLANT
PENCIL BIPOLAR 25GA STR DISP (OPHTHALMIC RELATED) ×3 IMPLANT
PICK MICROPICK 25G (MISCELLANEOUS) IMPLANT
PROBE LASER ILLUM FLEX CVD 25G (OPHTHALMIC) ×3 IMPLANT
ROLLS DENTAL (MISCELLANEOUS) IMPLANT
SCRAPER DIAMOND 25GA (OPHTHALMIC RELATED) IMPLANT
SOLUTION ANTI FOG 6CC (MISCELLANEOUS) ×3 IMPLANT
STOPCOCK 4 WAY LG BORE MALE ST (IV SETS) IMPLANT
SUT VICRYL 7 0 TG140 8 (SUTURE) ×3 IMPLANT
SUT VICRYL 8 0 TG140 8 (SUTURE) IMPLANT
SYR 10ML LL (SYRINGE) IMPLANT
SYR 20CC LL (SYRINGE) ×3 IMPLANT
SYR 5ML LL (SYRINGE) ×3 IMPLANT
SYR TB 1ML LUER SLIP (SYRINGE) ×3 IMPLANT
WATER STERILE IRR 1000ML POUR (IV SOLUTION) ×3 IMPLANT
WIPE INSTRUMENT VISIWIPE 73X73 (MISCELLANEOUS) IMPLANT

## 2017-09-18 NOTE — Op Note (Signed)
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  A lid speculum was placed.  Infusion line and trocar was placed at the 8 o'clock position approximately 3.5 mm from the surgical limbus.   The infusion line was allowed to run and then clamped when placed at the cannula opening. The line was inserted and secured to the drape with an adhesive strip.   Active trocars/cannula were placed at the 10 and 2 o'clock positions approximately 3.5 mm from the surgical limbus. The cannula was visualized in the vitreous cavity.  The light pipe and vitreous cutter were inserted into the vitreous cavity and a core vitrectomy was performed.  Care taken to remove the vitreous up to the vitreous base for 360 degrees.   Careful indented vitrectomy was performed to shave vitreous from the vitreous base as well as remove any traction to the macula.  Care was taken to remove membranes to relieve the tractional retinal detachment along the superior arcade involving the macula.  Additionally hemorrhage (old dehemoglobinized and new hemorrhage - preretinal) was noted over the superotemporal macula.  This hemorrhage was carefully removed.  The underlying retina was very ischemic and one break was noted.  The gliotic tractional detachment was associated with a break involving very ischemic retina in the superotemporal macula.  The overlying gliotic tissue was removed.  After removal of the membraned, a complete air/fluid exchange was performed.  360 degree peripheral laser was applied to  the periphery and surrounding the breaks.   18% SF6 gas was placed in the eye to achieve a near complete fill.  The trocars were sequentially removed and the superonasal and inferonasal sclerotomy closed with 8-0 Vicryl suture as well as the overlying conjunctiva.  Normal intraocular pressure was noted by digital palpapation.  Subconjunctival injection of Ancef and Dexamethasone 4mg /33ml was placed.   The speculum and drapes were removed and the eye was patched with Polymixin/Bacitracin ophthalmic ointment. An eye shield was placed and the patient was transferred alert and conversant with stable vital signs to the post operative recovery area.  The patient tolerated the procedure well and no complications were noted.  Royston Cowper MD

## 2017-09-18 NOTE — Anesthesia Postprocedure Evaluation (Signed)
Anesthesia Post Note  Patient: Timothy [name]  Procedure(s) Performed: PARS PLANA VITRECTOMY WITH 25 GAUGE MEMBRANE PILL WITH AIR GAS SILICONE OIL AND PHOTOCOAGULATION (Right Eye)     Patient location during evaluation: PACU Anesthesia Type: General Level of consciousness: awake and alert Pain management: pain level controlled Vital Signs Assessment: post-procedure vital signs reviewed and stable Respiratory status: spontaneous breathing, nonlabored ventilation, respiratory function stable and patient connected to nasal cannula oxygen Cardiovascular status: blood pressure returned to baseline and stable Postop Assessment: no apparent nausea or vomiting Anesthetic complications: no    Last Vitals:  Vitals:   09/18/17 1847 09/18/17 1919  BP: (!) 144/69 (!) 155/68  Pulse: (!) 58 (!) 58  Resp: 14 16  Temp: (!) 36.2 C   SpO2: 93% 99%    Last Pain:  Vitals:   09/18/17 1203  TempSrc:   PainSc: 3                  Effie Berkshire

## 2017-09-18 NOTE — Brief Op Note (Signed)
09/18/2017  6:10 PM  PATIENT:  Timothy [name]  77 y.o. male  PRE-OPERATIVE DIAGNOSIS:  TYPE 2 DIABETICS WITH PROLIFERATIVE DIABETICE RETINOPATHY WITH TRACTION RETINAL DETACHMENT INVOLVING MALCULA RIGHT EYE  POST-OPERATIVE DIAGNOSIS:  TYPE 2 DIABETICS WITH PROLIFERATIVE DIABETICE RETINOPATHY WITH TRACTION RETINAL DETACHMENT INVOLVING MALCULA RIGHT EYE  PROCEDURE:  Procedure(s): PARS PLANA VITRECTOMY WITH 25 GAUGE MEMBRANE PEAL, MEMBRANECTOMY, RETINOTOMY, ENDOLASER, AIR/FLUID EXCHANGE, TAMPONADE WITH SF6  GAS  (Right)  SURGEON:  Surgeon(s) and Role:    * Jalene Mullet, MD - Primary  PHYSICIAN ASSISTANT:   ASSISTANTS: none   ANESTHESIA:   local and general  EBL:  MINIMAL   BLOOD ADMINISTERED:none  DRAINS: none   LOCAL MEDICATIONS USED:  LIDOCAINE   SPECIMEN:  No Specimen  DISPOSITION OF SPECIMEN:  N/A  COUNTS:  YES   TOURNIQUET:  * No tourniquets in log *  DICTATION: .Note written in EPIC  PLAN OF CARE: Discharge to home after PACU  PATIENT DISPOSITION:  PACU - hemodynamically stable.   Delay start of Pharmacological VTE agent (>24hrs) due to surgical blood loss or risk of bleeding: not applicable

## 2017-09-18 NOTE — Anesthesia Preprocedure Evaluation (Addendum)
Anesthesia Evaluation  Patient identified by MRN, date of birth, ID band Patient awake    Reviewed: Allergy & Precautions, NPO status , Patient's Chart, lab work & pertinent test results  History of Anesthesia Complications Negative for: history of anesthetic complications  Airway Mallampati: II  TM Distance: >3 FB Neck ROM: Full    Dental  (+) Poor Dentition, Missing, Loose, Chipped, Dental Advisory Given   Pulmonary former smoker (quit 1992),    breath sounds clear to auscultation       Cardiovascular hypertension (does not take medication), (-) angina Rhythm:Regular Rate:Normal  07/14/2017 Stress test: no signs of decreased blood flow to heart muscle.    Neuro/Psych H/o TBI negative psych ROS   GI/Hepatic negative GI ROS, (+)     substance abuse  alcohol use,   Endo/Other  negative endocrine ROSdiabetes (glu 90, does not take his medicaiton), Poorly Controlled, Oral Hypoglycemic Agents  Renal/GU Renal InsufficiencyRenal disease (creat 1.85)negative Renal ROS     Musculoskeletal   Abdominal   Peds  Hematology  (+) Blood dyscrasia (Hb 9.8), anemia ,   Anesthesia Other Findings   Reproductive/Obstetrics                            Anesthesia Physical Anesthesia Plan  ASA: III  Anesthesia Plan: General   Post-op Pain Management:    Induction: Intravenous  PONV Risk Score and Plan: 2 and Ondansetron and Dexamethasone  Airway Management Planned: Oral ETT  Additional Equipment:   Intra-op Plan:   Post-operative Plan: Extubation in OR  Informed Consent: I have reviewed the patients History and Physical, chart, labs and discussed the procedure including the risks, benefits and alternatives for the proposed anesthesia with the patient or authorized representative who has indicated his/her understanding and acceptance.   Dental advisory given  Plan Discussed with: CRNA and  Surgeon  Anesthesia Plan Comments: (Plan routine monitors, GETA  Discussed via translator)        Anesthesia Quick Evaluation

## 2017-09-18 NOTE — H&P (Signed)
  Date of examination:  09/17/2017  Indication for surgery: tractional retinal detachment right eye  Pertinent past medical history:  Past Medical History:  Diagnosis Date  . Alcohol abuse    Patient denies, but was shared with people from [facility] by his male roommate.  . Bilateral cataracts 04/08/2017  . DM (diabetes mellitus), type 2 with renal complications ([place])    uncertain when diagnosed  . HOH (hard of hearing)   . Hypertension   . Legally blind    B/L  . 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 No pertinent family history.  General:  Healthy appearing patient in no distress.    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

## 2017-09-18 NOTE — Anesthesia Procedure Notes (Signed)
Procedure Name: Intubation Date/Time: 09/18/2017 4:17 PM Performed by: Eligha Bridegroom, CRNA Pre-anesthesia Checklist: Patient identified, Emergency Drugs available, Suction available, Patient being monitored and Timeout performed Oxygen Delivery Method: Circle system utilized Preoxygenation: Pre-oxygenation with 100% oxygen Induction Type: IV induction Ventilation: Mask ventilation without difficulty Laryngoscope Size: Mac and 4 Grade View: Grade I Tube type: Oral Tube size: 7.5 mm Number of attempts: 1 Airway Equipment and Method: Stylet Placement Confirmation: ETT inserted through vocal cords under direct vision,  positive ETCO2 and breath sounds checked- equal and bilateral Secured at: 22 cm Tube secured with: Tape Dental Injury: Teeth and Oropharynx as per pre-operative assessment

## 2017-09-18 NOTE — Discharge Instructions (Addendum)
DO NOT SLEEP ON BACK, THE EYE PRESSURE CAN GO UP AND CAUSE VISION LOSS   SLEEP ON SIDE WITH NOSE TO PILLOW  DURING DAY KEEP FACE DOWN.  15 MINUTES EVERY 2 HOURS IT IS OK TO LOOK STRAIGHT AHEAD (USE BATHROOM, EAT, WALK, ETC.)  

## 2017-09-18 NOTE — Transfer of Care (Addendum)
Immediate Anesthesia Transfer of Care Note  Patient: Timothy [name]  Procedure(s) Performed: PARS PLANA VITRECTOMY WITH 25 GAUGE MEMBRANE PILL WITH AIR GAS SILICONE OIL AND PHOTOCOAGULATION (Right )  Patient Location: PACU  Anesthesia Type:General  Level of Consciousness: awake and alert   Airway & Oxygen Therapy: Patient Spontanous Breathing and Patient connected to nasal cannula oxygen  Post-op Assessment: Report given to RN and Post -op Vital signs reviewed and stable  Post vital signs: Reviewed and stable  Last Vitals:  Vitals Value Taken Time  BP    Temp    Pulse    Resp    SpO2      Last Pain:  Vitals:   09/18/17 1203  TempSrc:   PainSc: 3          Complications: No apparent anesthesia complications

## 2017-09-18 NOTE — Telephone Encounter (Signed)
Case manager at [facility] was already gone for the day, will call back on Monday. [name], CMA

## 2017-09-18 NOTE — Telephone Encounter (Signed)
-----   Message from [name], PA-C sent at 09/18/2017  2:21 PM EDT ----- Normal RUQ [name].

## 2017-09-19 ENCOUNTER — Encounter (HOSPITAL_COMMUNITY): Payer: Self-pay | Admitting: Ophthalmology

## 2017-09-20 LAB — MITOCHONDRIAL ANTIBODIES: Mitochondrial Ab: <20 Units (ref 0.0–20.0)

## 2017-09-21 ENCOUNTER — Encounter (INDEPENDENT_AMBULATORY_CARE_PROVIDER_SITE_OTHER): Payer: Self-pay | Admitting: Physician Assistant

## 2017-09-21 ENCOUNTER — Ambulatory Visit (INDEPENDENT_AMBULATORY_CARE_PROVIDER_SITE_OTHER): Payer: Medicare Other | Admitting: Physician Assistant

## 2017-09-21 VITALS — BP 177/80 | HR 68 | Temp 97.3°F | Resp 18 | Ht 61.02 in | Wt 100.0 lb

## 2017-09-21 DIAGNOSIS — H547 Unspecified visual loss: Secondary | ICD-10-CM

## 2017-09-21 DIAGNOSIS — E1122 Type 2 diabetes mellitus with diabetic chronic kidney disease: Secondary | ICD-10-CM

## 2017-09-21 DIAGNOSIS — N183 Chronic kidney disease, stage 3 unspecified: Secondary | ICD-10-CM

## 2017-09-21 LAB — GLUCOSE, POCT (MANUAL RESULT ENTRY): POC Glucose: 181 mg/dl — AB (ref 70–99)

## 2017-09-21 NOTE — Progress Notes (Signed)
Subjective:  Patient ID: Timothy [name], male    DOB: 1941/03/24  Age: 77 y.o. MRN: [id number]  CC: HTN, DM  HPI  Timothy [name] a 77 y.o.malewith a medical history of HTN, Hypertriglyceridemia, DM2, CKD3, anemia, blindness, and partial hearing loss presents to f/u on HTN and DM. Last BP 171/76 mmHg one month ago. Medications were refilled including three anti-hypertensives. BP 177/80 mmHg today. Says he is feeling well except for mild right eye burning and moderately worsening vision. Saw ophthalmologist last week and underwent a procedure. Was prescribed eye drops and told to return the next day for f/u which he did not do. Has not called his ophthalmologist. Using eye drops prescribed by ophthalmologist. Otherwise, says he feels better and is asymptomatic. Taking anti-hypertensives as directed. No other symptoms or complaints.     Outpatient Medications Prior to Visit  Medication Sig Dispense Refill  . albuterol (PROVENTIL HFA;VENTOLIN HFA) 108 (90 Base) MCG/ACT inhaler Inhale 2 puffs into the lungs every 6 (six) hours as needed for wheezing or shortness of breath. 1 Inhaler 2  . amLODipine (NORVASC) 10 MG tablet Take 1 tablet (10 mg total) by mouth daily. 30 tablet 5  . aspirin 81 MG chewable tablet Chew 1 tablet (81 mg total) by mouth daily. 30 tablet 11  . carvedilol (COREG) 6.25 MG tablet Take 1 tablet (6.25 mg total) by mouth 2 (two) times daily with a meal. 60 tablet 5  . ferrous gluconate (FERGON) 324 MG tablet Take 1 tablet (324 mg total) by mouth daily with breakfast. 30 tablet 5  . folic acid (FOLVITE) 1 MG tablet Take 1 tablet (1 mg total) by mouth daily. 30 tablet 5  . glimepiride (AMARYL) 4 MG tablet Take 1 tablet (4 mg total) by mouth daily before breakfast. 30 tablet 5  . Multiple Vitamin (MULTIVITAMIN WITH MINERALS) TABS tablet Take 1 tablet by mouth daily. 30 tablet 5  . ofloxacin (OCUFLOX) 0.3 % ophthalmic solution Place 1 drop into the left eye 4 (four) times daily.    .  prednisoLONE acetate (PRED FORTE) 1 % ophthalmic suspension Place 1 drop into the left eye 4 (four) times daily.    Marland Kitchen thiamine (VITAMIN B-1) 100 MG tablet Take 100 mg by mouth daily.     No facility-administered medications prior to visit.      ROS Review of Systems  Constitutional: Negative for chills, fever and malaise/fatigue.  Eyes: Positive for blurred vision.  Respiratory: Negative for shortness of breath.   Cardiovascular: Negative for chest pain and palpitations.  Gastrointestinal: Negative for abdominal pain and nausea.  Genitourinary: Negative for dysuria and hematuria.  Musculoskeletal: Negative for joint pain and myalgias.  Skin: Negative for rash.  Neurological: Negative for tingling and headaches.  Psychiatric/Behavioral: Negative for depression. The patient is not nervous/anxious.     Objective:  There were no vitals taken for this visit.  Vitals:   09/21/17 1038  BP: (!) 177/80  Pulse: 68  Resp: 18  Temp: (!) 97.3 F (36.3 C)     Physical Exam  Constitutional: He is oriented to person, place, and time.  Thin, frail, malodorous, seems to have right eye discomfort  HENT:  Head: Normocephalic and atraumatic.  Eyes: No scleral icterus.  Right eye with severe scleral injection and yellowish overlying film  Neck: Normal range of motion. Neck supple. No thyromegaly present.  Cardiovascular: Normal rate, regular rhythm and normal heart sounds.  Pulmonary/Chest: Effort normal and breath sounds normal.  Musculoskeletal: He exhibits  no edema.  Neurological: He is alert and oriented to person, place, and time.  Skin: Skin is warm and dry. No rash noted. No erythema. No pallor.  Psychiatric: He has a normal mood and affect. His behavior is normal. Thought content normal.  Vitals reviewed.    Assessment & Plan:   * Can call Ambulance person at [facility] [phone].  1. Impaired vision - We called his ophthalmologist's office and got him  scheduled to be seen within the hour. Pt discharged and told to go directly to his ophthalmologist.   2. Type 2 diabetes mellitus with stage 3 chronic kidney disease, without long-term current use of insulin (HCC) - Glucose (CBG) 181 mg/dL today.     Follow-up: Return in about 1 month (around 10/19/2017) for HTN.   [name] PA

## 2017-09-21 NOTE — Patient Instructions (Signed)
Visual Disturbances A visual disturbance is any problem that interferes with your normal vision. You can have a visual disturbance in one eye or both eyes. Some types of visual disturbances come and go without treatment and do not cause a permanent problem. Other visual disturbances may be a sign of a serious medical emergency. There are many signs and symptoms of a visual disturbance, including:  Blurred vision.  Inability to see certain colors.  Seeing floating spots (floaters).  Light sensitivity.  Flashing or shimmering lights.  Zigzagging lines or stars.  Seeing the floor as tilted (visual midline shift).  Being unaware of objects on one side of the body (visual spatial inattention).  Double vision.  Rhythmic, involuntary eye movements (nystagmus).  Hallucinations.  Temporary or permanent blindness.  Follow these instructions at home:  Take over-the-counter and prescription medicines only as told by your health care provider.  To lower your risk of the problems that can lead to visual disturbances: ? Eat a healthy diet. ? Maintain a healthy weight. Lose weight if you need to. ? Exercise regularly. Ask your health care provider what activities are safe for you.  Avoid migraine triggers when possible.  Keep all follow-up visits as told by your health care provider. This is important. Contact a health care provider if:  Your visual disturbance changes or becomes worse.  You notice any new visual disturbances. Get help right away if:  You lose most or all of your vision in one eye or both eyes.  You experience visual hallucinations.  You have chest pain, nausea, or vomiting along with visual disturbances. This information is not intended to replace advice given to you by your health care provider. Make sure you discuss any questions you have with your health care provider. Document Released: 06/12/2004 Document Revised: 10/17/2015 Document Reviewed:  10/12/2013 Elsevier Interactive Patient Education  [employer].

## 2017-09-21 NOTE — Progress Notes (Signed)
181 

## 2017-09-25 ENCOUNTER — Telehealth (INDEPENDENT_AMBULATORY_CARE_PROVIDER_SITE_OTHER): Payer: Self-pay

## 2017-09-25 NOTE — Telephone Encounter (Signed)
Caregiver notified of normal RUQ ultrasound. [name], CMA

## 2017-09-25 NOTE — Telephone Encounter (Signed)
-----   Message from [name], PA-C sent at 09/18/2017  2:21 PM EDT ----- Normal RUQ [name].

## 2017-09-28 ENCOUNTER — Other Ambulatory Visit: Payer: Self-pay

## 2017-09-28 NOTE — Progress Notes (Signed)
Spoke with Scientist, research (life sciences), Med tech at [facility]. Pt was seen here last week for eye surgery. She states pt has not c/o chest pain or sob. No recent pneumonia, fever or flu like symptoms. Pt is diabetic. Last A1C was 6.5 on 09/18/17. Cervante states that pt has not had his fasting blood sugar checked in several days. Faxed pre-op instructions to [phone].

## 2017-09-28 NOTE — Pre-Procedure Instructions (Signed)
   Hunter Kainoa  09/28/2017     Your procedure is scheduled on Tuesday, Sep 29, 2017 at 1:30 PM.   Report to [facility] Entrance "A" Admitting Office at 11:00 AM   Call this number if you have problems the morning of surgery: [phone]   Remember:  Patient is not to eat food or drink liquids after midnight tonight.   Take these medicines the morning of surgery with A SIP OF WATER: Amlodipine (Norvasc), Carvedilol (Coreg)  Pt is not to take Glimepiride (Amaryl) in the AM.   Please check patient's blood sugar in the AM when he gets up and every 2 hours until he leaves for the hospital. If blood sugar is 70 or below, treat with 1/2 cup of clear juice (apple or cranberry) and recheck blood sugar 15 minutes after drinking juice. If blood sugar continues to be 70 or below, call the Short Stay department and ask to speak to a nurse.   Do not wear jewelry.  Do not wear lotions, powders, cologne or deodorant.  Men may shave face and neck.  Do not bring valuables to the hospital.  [facility] is not responsible for any belongings or valuables.  Contacts, dentures or bridgework may not be worn into surgery.  Leave your suitcase in the car.  After surgery it may be brought to your room.  For patients admitted to the hospital, discharge time will be determined by your treatment team.  Patients discharged the day of surgery will not be allowed to drive home.   If any questions this afternoon, call me, Lilia Pro, RN at [phone].

## 2017-09-29 ENCOUNTER — Ambulatory Visit (HOSPITAL_COMMUNITY)
Admission: RE | Admit: 2017-09-29 | Discharge: 2017-09-29 | Disposition: A | Discharge status: Other Acute Inpatient Hospital | Payer: Medicare Other | Source: Ambulatory Visit | Attending: Ophthalmology | Admitting: Ophthalmology

## 2017-09-29 ENCOUNTER — Encounter (HOSPITAL_COMMUNITY)
Admission: RE | Disposition: A | Discharge status: Nursing Home (Includes ICF) | Payer: Self-pay | Source: Ambulatory Visit | Attending: Ophthalmology

## 2017-09-29 ENCOUNTER — Ambulatory Visit (HOSPITAL_COMMUNITY): Payer: Medicare Other | Admitting: Anesthesiology

## 2017-09-29 ENCOUNTER — Encounter (HOSPITAL_COMMUNITY): Payer: Self-pay

## 2017-09-29 DIAGNOSIS — H3321 Serous retinal detachment, right eye: Secondary | ICD-10-CM | POA: Diagnosis not present

## 2017-09-29 DIAGNOSIS — N289 Disorder of kidney and ureter, unspecified: Secondary | ICD-10-CM | POA: Diagnosis not present

## 2017-09-29 DIAGNOSIS — E1129 Type 2 diabetes mellitus with other diabetic kidney complication: Secondary | ICD-10-CM | POA: Insufficient documentation

## 2017-09-29 DIAGNOSIS — H4311 Vitreous hemorrhage, right eye: Secondary | ICD-10-CM | POA: Insufficient documentation

## 2017-09-29 DIAGNOSIS — E1136 Type 2 diabetes mellitus with diabetic cataract: Secondary | ICD-10-CM | POA: Diagnosis not present

## 2017-09-29 DIAGNOSIS — Z7984 Long term (current) use of oral hypoglycemic drugs: Secondary | ICD-10-CM | POA: Diagnosis not present

## 2017-09-29 DIAGNOSIS — Z87891 Personal history of nicotine dependence: Secondary | ICD-10-CM | POA: Diagnosis not present

## 2017-09-29 HISTORY — PX: MEMBRANE PEEL: SHX5967

## 2017-09-29 HISTORY — PX: PARS PLANA VITRECTOMY: SHX2166

## 2017-09-29 HISTORY — PX: REPAIR OF COMPLEX TRACTION RETINAL DETACHMENT: SHX6217

## 2017-09-29 HISTORY — PX: PHOTOCOAGULATION: SHX5303

## 2017-09-29 LAB — GLUCOSE, CAPILLARY
GLUCOSE-CAPILLARY: 70 mg/dL (ref 65–99)
GLUCOSE-CAPILLARY: 53 mg/dL — AB (ref 65–99)
Glucose-Capillary: 148 mg/dL — ABNORMAL HIGH (ref 65–99)

## 2017-09-29 SURGERY — REPAIR, RETINAL DETACHMENT, COMPLEX
Anesthesia: General | Site: Eye | Laterality: Right

## 2017-09-29 MED ORDER — TOBRAMYCIN-DEXAMETHASONE 0.3-0.1 % OP OINT
TOPICAL_OINTMENT | OPHTHALMIC | Status: DC | PRN
  Administered 2017-09-29: 1 via OPHTHALMIC

## 2017-09-29 MED ORDER — EPHEDRINE SULFATE 50 MG/ML IJ SOLN
INTRAMUSCULAR | Status: DC | PRN
  Administered 2017-09-29 (×2): 10 mg via INTRAVENOUS

## 2017-09-29 MED ORDER — TETRACAINE HCL 0.5 % OP SOLN
OPHTHALMIC | Status: AC
  Filled 2017-09-29: qty 4

## 2017-09-29 MED ORDER — DEXTROSE 50 % IV SOLN
INTRAVENOUS | Status: AC
  Administered 2017-09-29: 25 mL via INTRAVENOUS
  Filled 2017-09-29: qty 50

## 2017-09-29 MED ORDER — CEFAZOLIN SUBCONJUNCTIVAL INJECTION 100 MG/0.5 ML
200.0000 mg | INJECTION | SUBCONJUNCTIVAL | Status: AC
  Administered 2017-09-29: 200 mg via SUBCONJUNCTIVAL
  Filled 2017-09-29: qty 2

## 2017-09-29 MED ORDER — BSS PLUS IO SOLN
INTRAOCULAR | Status: AC
  Filled 2017-09-29: qty 500

## 2017-09-29 MED ORDER — OFLOXACIN 0.3 % OP SOLN
1.0000 [drp] | OPHTHALMIC | Status: AC | PRN
  Administered 2017-09-29 (×3): 1 [drp] via OPHTHALMIC
  Filled 2017-09-29: qty 5

## 2017-09-29 MED ORDER — PROPOFOL 10 MG/ML IV BOLUS
INTRAVENOUS | Status: AC
  Filled 2017-09-29: qty 20

## 2017-09-29 MED ORDER — EPINEPHRINE PF 1 MG/ML IJ SOLN
INTRAMUSCULAR | Status: AC
  Filled 2017-09-29: qty 1

## 2017-09-29 MED ORDER — PHENYLEPHRINE HCL 10 % OP SOLN
1.0000 [drp] | OPHTHALMIC | Status: AC | PRN
  Administered 2017-09-29 (×3): 1 [drp] via OPHTHALMIC
  Filled 2017-09-29: qty 5

## 2017-09-29 MED ORDER — BSS IO SOLN
INTRAOCULAR | Status: DC | PRN
  Administered 2017-09-29: 15 mL via INTRAOCULAR

## 2017-09-29 MED ORDER — SODIUM CHLORIDE 0.9 % IV SOLN
INTRAVENOUS | Status: DC | PRN
  Administered 2017-09-29 (×2): via INTRAVENOUS

## 2017-09-29 MED ORDER — ONDANSETRON HCL 4 MG/2ML IJ SOLN
INTRAMUSCULAR | Status: AC
  Filled 2017-09-29: qty 2

## 2017-09-29 MED ORDER — EPHEDRINE SULFATE 50 MG/ML IJ SOLN
INTRAMUSCULAR | Status: AC
  Filled 2017-09-29: qty 1

## 2017-09-29 MED ORDER — FENTANYL CITRATE (PF) 250 MCG/5ML IJ SOLN
INTRAMUSCULAR | Status: AC
  Filled 2017-09-29: qty 5

## 2017-09-29 MED ORDER — LIDOCAINE HCL 2 % IJ SOLN
INTRAMUSCULAR | Status: AC
  Filled 2017-09-29: qty 20

## 2017-09-29 MED ORDER — LIDOCAINE 2% (20 MG/ML) 5 ML SYRINGE
INTRAMUSCULAR | Status: AC
  Filled 2017-09-29: qty 5

## 2017-09-29 MED ORDER — HYALURONIDASE HUMAN 150 UNIT/ML IJ SOLN
INTRAMUSCULAR | Status: AC
  Filled 2017-09-29: qty 1

## 2017-09-29 MED ORDER — PHENYLEPHRINE HCL 2.5 % OP SOLN
1.0000 [drp] | OPHTHALMIC | Status: AC | PRN
  Administered 2017-09-29 (×3): 1 [drp] via OPHTHALMIC
  Filled 2017-09-29: qty 2

## 2017-09-29 MED ORDER — 0.9 % SODIUM CHLORIDE (POUR BTL) OPTIME
TOPICAL | Status: DC | PRN
  Administered 2017-09-29: 1000 mL

## 2017-09-29 MED ORDER — HYPROMELLOSE (GONIOSCOPIC) 2.5 % OP SOLN
OPHTHALMIC | Status: DC | PRN
  Administered 2017-09-29: 2 [drp] via OPHTHALMIC

## 2017-09-29 MED ORDER — DEXTROSE 50 % IV SOLN
25.0000 mL | Freq: Once | INTRAVENOUS | Status: AC
  Administered 2017-09-29: 25 mL via INTRAVENOUS

## 2017-09-29 MED ORDER — PHENYLEPHRINE 40 MCG/ML (10ML) SYRINGE FOR IV PUSH (FOR BLOOD PRESSURE SUPPORT)
PREFILLED_SYRINGE | INTRAVENOUS | Status: DC | PRN
  Administered 2017-09-29: 80 ug via INTRAVENOUS

## 2017-09-29 MED ORDER — TOBRAMYCIN-DEXAMETHASONE 0.3-0.1 % OP OINT
TOPICAL_OINTMENT | OPHTHALMIC | Status: AC
  Filled 2017-09-29: qty 3.5

## 2017-09-29 MED ORDER — HYPROMELLOSE (GONIOSCOPIC) 2.5 % OP SOLN
OPHTHALMIC | Status: AC
  Filled 2017-09-29: qty 15

## 2017-09-29 MED ORDER — TROPICAMIDE 1 % OP SOLN
1.0000 [drp] | OPHTHALMIC | Status: AC | PRN
  Administered 2017-09-29 (×3): 1 [drp] via OPHTHALMIC
  Filled 2017-09-29: qty 15

## 2017-09-29 MED ORDER — ATROPINE SULFATE 1 % OP SOLN
OPHTHALMIC | Status: AC
  Filled 2017-09-29: qty 5

## 2017-09-29 MED ORDER — EPINEPHRINE PF 1 MG/ML IJ SOLN
INTRAOCULAR | Status: DC | PRN
  Administered 2017-09-29: 500 mL

## 2017-09-29 MED ORDER — BUPIVACAINE HCL (PF) 0.75 % IJ SOLN
INTRAMUSCULAR | Status: AC
  Filled 2017-09-29: qty 10

## 2017-09-29 MED ORDER — PROPOFOL 10 MG/ML IV BOLUS
INTRAVENOUS | Status: DC | PRN
  Administered 2017-09-29: 100 mg via INTRAVENOUS

## 2017-09-29 MED ORDER — BSS IO SOLN
INTRAOCULAR | Status: AC
  Filled 2017-09-29: qty 15

## 2017-09-29 MED ORDER — DEXAMETHASONE SODIUM PHOSPHATE 10 MG/ML IJ SOLN
INTRAMUSCULAR | Status: DC | PRN
  Administered 2017-09-29: 10 mg via INTRAVENOUS

## 2017-09-29 MED ORDER — DEXAMETHASONE SODIUM PHOSPHATE 10 MG/ML IJ SOLN
INTRAMUSCULAR | Status: AC
  Filled 2017-09-29: qty 1

## 2017-09-29 MED ORDER — SODIUM CHLORIDE 0.9 % IJ SOLN
INTRAMUSCULAR | Status: AC
  Filled 2017-09-29: qty 10

## 2017-09-29 MED ORDER — SUCCINYLCHOLINE CHLORIDE 200 MG/10ML IV SOSY
PREFILLED_SYRINGE | INTRAVENOUS | Status: AC
  Filled 2017-09-29: qty 10

## 2017-09-29 MED ORDER — ONDANSETRON HCL 4 MG/2ML IJ SOLN
INTRAMUSCULAR | Status: DC | PRN
  Administered 2017-09-29: 4 mg via INTRAVENOUS

## 2017-09-29 MED ORDER — FENTANYL CITRATE (PF) 250 MCG/5ML IJ SOLN
INTRAMUSCULAR | Status: DC | PRN
  Administered 2017-09-29 (×2): 50 ug via INTRAVENOUS

## 2017-09-29 MED ORDER — PHENYLEPHRINE 40 MCG/ML (10ML) SYRINGE FOR IV PUSH (FOR BLOOD PRESSURE SUPPORT)
PREFILLED_SYRINGE | INTRAVENOUS | Status: AC
  Filled 2017-09-29: qty 10

## 2017-09-29 MED ORDER — LIDOCAINE 2% (20 MG/ML) 5 ML SYRINGE
INTRAMUSCULAR | Status: DC | PRN
  Administered 2017-09-29: 50 mg via INTRAVENOUS

## 2017-09-29 MED ORDER — SUCCINYLCHOLINE CHLORIDE 200 MG/10ML IV SOSY
PREFILLED_SYRINGE | INTRAVENOUS | Status: DC | PRN
  Administered 2017-09-29: 100 mg via INTRAVENOUS

## 2017-09-29 MED ORDER — FENTANYL CITRATE (PF) 100 MCG/2ML IJ SOLN: 25.0000 ug | INTRAMUSCULAR | Status: DC | PRN

## 2017-09-29 SURGICAL SUPPLY — 73 items
APPLICATOR COTTON TIP 6IN STRL (MISCELLANEOUS) ×3 IMPLANT
APPLICATOR DR MATTHEWS STRL (MISCELLANEOUS) ×3 IMPLANT
BLADE MINI 60D BLUE (BLADE) ×3 IMPLANT
BLADE MINI RND TIP GREEN BEAV (BLADE) IMPLANT
BLADE MVR KNIFE 20G (BLADE) IMPLANT
BLADE STAB KNIFE 15DEG (BLADE) IMPLANT
CANNULA ANT CHAM MAIN (OPHTHALMIC RELATED) IMPLANT
CANNULA DUAL BORE 23G (CANNULA) IMPLANT
CANNULA DUALBORE 25G (CANNULA) ×3 IMPLANT
CANNULA VLV SOFT TIP 25GA (OPHTHALMIC) ×3 IMPLANT
CAUTERY EYE LOW TEMP 1300F FIN (OPHTHALMIC RELATED) IMPLANT
CLOSURE STERI-STRIP 1/2X4 (GAUZE/BANDAGES/DRESSINGS) ×1
CLSR STERI-STRIP ANTIMIC 1/2X4 (GAUZE/BANDAGES/DRESSINGS) ×2 IMPLANT
CORDS BIPOLAR (ELECTRODE) IMPLANT
COVER MAYO STAND STRL (DRAPES) IMPLANT
COVER SURGICAL LIGHT HANDLE (MISCELLANEOUS) ×3 IMPLANT
DRAPE HALF SHEET 40X57 (DRAPES) ×3 IMPLANT
DRAPE INCISE 51X51 W/FILM STRL (DRAPES) IMPLANT
DRAPE RETRACTOR (MISCELLANEOUS) ×3 IMPLANT
ERASER HMR WETFIELD 23G BP (MISCELLANEOUS) IMPLANT
FILTER BLUE MILLIPORE (MISCELLANEOUS) IMPLANT
FILTER STRAW FLUID ASPIR (MISCELLANEOUS) IMPLANT
FORCEPS ECKARDT ILM 25G SERR (OPHTHALMIC RELATED) IMPLANT
FORCEPS GRIESHABER ILM 25G A (INSTRUMENTS) IMPLANT
GAS AUTO FILL CONSTEL (OPHTHALMIC)
GAS AUTO FILL CONSTELLATION (OPHTHALMIC) IMPLANT
GAS OPHTHALMIC (MISCELLANEOUS) IMPLANT
GLOVE ECLIPSE 7.5 STRL STRAW (GLOVE) ×3 IMPLANT
GOWN STRL REUS W/ TWL LRG LVL3 (GOWN DISPOSABLE) ×2 IMPLANT
GOWN STRL REUS W/TWL LRG LVL3 (GOWN DISPOSABLE) ×4
KIT BASIN OR (CUSTOM PROCEDURE TRAY) ×3 IMPLANT
KIT PERFLUORON PROCEDURE 5ML (MISCELLANEOUS) IMPLANT
KIT TURNOVER KIT B (KITS) ×3 IMPLANT
LENS BIOM SUPER VIEW SET DISP (OPHTHALMIC RELATED) ×3 IMPLANT
MICROPICK 25G (MISCELLANEOUS)
NEEDLE 18GX1X1/2 (RX/OR ONLY) (NEEDLE) ×3 IMPLANT
NEEDLE 25GX 5/8IN NON SAFETY (NEEDLE) ×3 IMPLANT
NEEDLE FILTER BLUNT 18X 1/2SAF (NEEDLE) ×2
NEEDLE FILTER BLUNT 18X1 1/2 (NEEDLE) ×1 IMPLANT
NEEDLE HYPO 25GX1X1/2 BEV (NEEDLE) IMPLANT
NEEDLE HYPO 30X.5 LL (NEEDLE) ×6 IMPLANT
NEEDLE RETROBULBAR 25GX1.5 (NEEDLE) ×3 IMPLANT
NS IRRIG 1000ML POUR BTL (IV SOLUTION) ×3 IMPLANT
PACK FRAGMATOME (OPHTHALMIC) IMPLANT
PACK VITRECTOMY CUSTOM (CUSTOM PROCEDURE TRAY) ×3 IMPLANT
PAD ARMBOARD 7.5X6 YLW CONV (MISCELLANEOUS) ×6 IMPLANT
PAK PIK VITRECTOMY CVS 25GA (OPHTHALMIC) ×3 IMPLANT
PAK VITRECTOMY PIK 25 GA (OPHTHALMIC RELATED) IMPLANT
PENCIL BIPOLAR 25GA STR DISP (OPHTHALMIC RELATED) IMPLANT
PICK MICROPICK 25G (MISCELLANEOUS) IMPLANT
PROBE LASER ILLUM FLEX CVD 25G (OPHTHALMIC) IMPLANT
REPL STRA BRUSH NEEDLE (NEEDLE) IMPLANT
RESERVOIR BACK FLUSH (MISCELLANEOUS) IMPLANT
RETRACTOR IRIS FLEX 25G GRIESH (INSTRUMENTS) IMPLANT
ROLLS DENTAL (MISCELLANEOUS) IMPLANT
SCRAPER DIAMOND 25GA (OPHTHALMIC RELATED) IMPLANT
SET FLUID INJECTOR (SET/KITS/TRAYS/PACK) IMPLANT
SHEET MEDIUM DRAPE 40X70 STRL (DRAPES) ×3 IMPLANT
SOLUTION ANTI FOG 6CC (MISCELLANEOUS) ×3 IMPLANT
STOCKINETTE IMPERVIOUS 9X36 MD (GAUZE/BANDAGES/DRESSINGS) ×6 IMPLANT
STOPCOCK 4 WAY LG BORE MALE ST (IV SETS) IMPLANT
SUT ETHILON 5.0 S-24 (SUTURE) ×3 IMPLANT
SUT SILK 2 0 (SUTURE) ×2
SUT SILK 2-0 18XBRD TIE 12 (SUTURE) ×1 IMPLANT
SUT VICRYL 7 0 TG140 8 (SUTURE) ×3 IMPLANT
SUT VICRYL 8 0 TG140 8 (SUTURE) ×3 IMPLANT
SYR 10ML LL (SYRINGE) IMPLANT
SYR 20CC LL (SYRINGE) ×3 IMPLANT
SYR 5ML LL (SYRINGE) ×3 IMPLANT
SYR TB 1ML LUER SLIP (SYRINGE) IMPLANT
TOWEL OR 17X24 6PK STRL BLUE (TOWEL DISPOSABLE) ×6 IMPLANT
WATER STERILE IRR 1000ML POUR (IV SOLUTION) ×3 IMPLANT
WIPE INSTRUMENT VISIWIPE 73X73 (MISCELLANEOUS) IMPLANT

## 2017-09-29 NOTE — Op Note (Signed)
Timothy [name] 09/29/2017 Diagnosis:  Vitreous hemorrhage with PDR right eye  Procedure: [place]ana Vitrectomy, Endolaser and Fluid Gas Exchange Operative Eye:  right eye  Surgeon: Royston Cowper Estimated Blood Loss: minimal Specimens for Pathology:  None Complications: none   The  patient was prepped and draped in the usual fashion for ocular surgery on the  right eye .  A lid speculum was placed.  Infusion line and trocar was placed at the 8 o'clock position approximately 3.5 mm from the surgical limbus.   The infusion line was allowed to run and then clamped when placed at the cannula opening. The line was inserted and secured to the drape with an adhesive strip.   Active trocars/cannula were placed at the 10 and 2 o'clock positions approximately 3.5 mm from the surgical limbus. The cannula was visualized in the vitreous cavity.  The light pipe and vitreous cutter were inserted into the vitreous cavity and a core vitrectomy was performed.  The vitreous hemorrhage was removed and preretinal heme irrigated free  Endolaser was applied 360 degrees to the periphery.  A partial air-fluid exchange was performed.  The superior cannulas were sequentially removed with concommitant tamponade using a cotton tipped applicator and the temporal sclerotomy closed with 8-0 Vicryl suture.  The remaining sclerotomy was noted to be air tight.  The infusion line and trocar were removed and the sclerotomy was noted to be air tight with normal intraocular pressure by digital palpapation.  Subconjunctival injections of  Dexamethasone 4mg /1ml was placed in the infero-medial quadrant.   The speculum and drapes were removed and the eye was patched with Polymixin/Bacitracin ophthalmic ointment. An eye shield was placed and the patient was transferred alert and conversant with stable vital signs to the post operative recovery area.  The patient tolerated the procedure well and no complications were noted.  Royston Cowper MD

## 2017-09-29 NOTE — Discharge Instructions (Addendum)
DO NOT SLEEP ON BACK, THE EYE PRESSURE CAN GO UP AND CAUSE VISION LOSS   SLEEP ON SIDE WITH NOSE TO PILLOW  DURING DAY KEEP UPRIGHT 

## 2017-09-29 NOTE — Anesthesia Postprocedure Evaluation (Signed)
Anesthesia Post Note  Patient: Camera operator  Procedure(s) Performed: REPAIR OF COMPLEX TRACTION RETINAL DETACHMENT (Right Eye) PARS PLANA VITRECTOMY WITH 25 GAUGE (Right Eye) MEMBRANE PEEL WITH AIR GAS (Right Eye) PHOTOCOAGULATION (Right Eye)     Patient location during evaluation: PACU Anesthesia Type: General Level of consciousness: awake Pain management: pain level controlled Vital Signs Assessment: post-procedure vital signs reviewed and stable Respiratory status: spontaneous breathing Cardiovascular status: stable Postop Assessment: no apparent nausea or vomiting Anesthetic complications: no    Last Vitals:  Vitals:   09/29/17 1700 09/29/17 1707  BP: (!) 176/64   Pulse: 66 69  Resp: 12 13  Temp:  (!) 36.2 C  SpO2: 100% 100%    Last Pain:  Vitals:   09/29/17 1645  TempSrc:   PainSc: 0-No pain   Pain Goal: Patients Stated Pain Goal: 0 (09/29/17 1233)               [place]

## 2017-09-29 NOTE — Transfer of Care (Signed)
Immediate Anesthesia Transfer of Care Note  Patient: Timothy [name]  Procedure(s) Performed: REPAIR OF COMPLEX TRACTION RETINAL DETACHMENT (Right Eye) PARS PLANA VITRECTOMY WITH 25 GAUGE (Right Eye) MEMBRANE PEEL WITH AIR GAS (Right Eye) PHOTOCOAGULATION (Right Eye)  Patient Location: PACU  Anesthesia Type:General  Level of Consciousness: awake and patient cooperative  Airway & Oxygen Therapy: Patient Spontanous Breathing  Post-op Assessment: Report given to RN and Post -op Vital signs reviewed and stable  Post vital signs: Reviewed and stable  Last Vitals:  Vitals Value Taken Time  BP 175/65 09/29/2017  4:17 PM  Temp    Pulse 64 09/29/2017  4:18 PM  Resp 14 09/29/2017  4:18 PM  SpO2 100 % 09/29/2017  4:18 PM  Vitals shown include unvalidated device data.  Last Pain:  Vitals:   09/29/17 1233  TempSrc:   PainSc: 0-No pain      Patients Stated Pain Goal: 0 (88/32/54 9826)  Complications: No apparent anesthesia complications

## 2017-09-29 NOTE — Anesthesia Procedure Notes (Signed)
Procedure Name: Intubation Date/Time: 09/29/2017 2:59 PM Performed by: Harden Mo, CRNA Pre-anesthesia Checklist: Patient identified, Emergency Drugs available, Suction available and Patient being monitored Patient Re-evaluated:Patient Re-evaluated prior to induction Oxygen Delivery Method: Circle System Utilized Preoxygenation: Pre-oxygenation with 100% oxygen Induction Type: IV induction Ventilation: Mask ventilation without difficulty Laryngoscope Size: Miller and 2 Grade View: Grade I Tube type: Oral Tube size: 7.5 mm Number of attempts: 1 Airway Equipment and Method: Stylet and Oral airway Placement Confirmation: ETT inserted through vocal cords under direct vision,  positive ETCO2 and breath sounds checked- equal and bilateral Secured at: 22 cm Tube secured with: Tape Dental Injury: Teeth and Oropharynx as per pre-operative assessment

## 2017-09-29 NOTE — Progress Notes (Signed)
Pt's blood pressure elevated at 209/65 and 217/58.  Called and informed Dr. Ola Spurr of blood pressure, he advised we continue to monitor at this time as we do not want his diastolic to drop any lower.  Also advised patient received amaryl this morning and his blood sugar was 70 upon arrival to Short Stay.  He verbalized understanding

## 2017-09-29 NOTE — H&P (Signed)
  Date of examination:  09/25/17  Indication for surgery: vitreous hemorrhage and retinal detachment right eye  Pertinent past medical history:  Past Medical History:  Diagnosis Date  . Alcohol abuse    Patient denies, but was shared with people from [facility] by his male roommate.  . Bilateral cataracts 04/08/2017  . DM (diabetes mellitus), type 2 with renal complications ([place])    uncertain when diagnosed  . HOH (hard of hearing)   . Hypertension   . Legally blind    B/L  . 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  General:  Healthy appearing patient in no distress.    Eyes:    Acuity OD HM    External: Within normal limits     Anterior segment: Within normal limits      Fundus: no view - vitreous hemorrhage OD  Impression: vitreous hemorrhage and retinal detachment right eye   Plan: Vitrectomy with placement of silicone oil right eye  Royston Cowper

## 2017-09-29 NOTE — Brief Op Note (Signed)
09/29/2017  4:18 PM  PATIENT:  Timothy [name]  77 y.o. male  PRE-OPERATIVE DIAGNOSIS:  Vitreous hemorrhage with PDR right eye  POST-OPERATIVE DIAGNOSIS:  Vitreous hemorrhage with PDR right eye  PROCEDURE:  Procedure(s):  PARS PLANA VITRECTOMY WITH 25 GAUGE (Right) AIR/FLUID EXCHANGE (Right) PHOTOCOAGULATION (Right)  SURGEON:  Surgeon(s) and Role:    * Jalene Mullet, MD - Primary  PHYSICIAN ASSISTANT:   ASSISTANTS: none   ANESTHESIA:   general  EBL:  0 mL   BLOOD ADMINISTERED:none  DRAINS: none   LOCAL MEDICATIONS USED:  NONE  SPECIMEN:  No Specimen  DISPOSITION OF SPECIMEN:  N/A  COUNTS:  YES  TOURNIQUET:  * No tourniquets in log *  DICTATION: .Note written in EPIC  PLAN OF CARE: Discharge to home after PACU  PATIENT DISPOSITION:  PACU - hemodynamically stable.   Delay start of Pharmacological VTE agent (>24hrs) due to surgical blood loss or risk of bleeding: not applicable

## 2017-09-29 NOTE — Anesthesia Preprocedure Evaluation (Addendum)
Anesthesia Evaluation  Patient identified by MRN, date of birth, ID band Patient awake    Reviewed: Allergy & Precautions, H&P , NPO status , Patient's Chart, lab work & pertinent test results, reviewed documented beta blocker date and time   Airway Mallampati: II  TM Distance: >3 FB Neck ROM: Full    Dental no notable dental hx. (+) Poor Dentition, Dental Advisory Given, Missing, Loose   Pulmonary neg pulmonary ROS, former smoker,    Pulmonary exam normal breath sounds clear to auscultation       Cardiovascular hypertension, Pt. on medications and Pt. on home beta blockers  Rhythm:Regular Rate:Normal     Neuro/Psych negative neurological ROS  negative psych ROS   GI/Hepatic negative GI ROS, Neg liver ROS, (+)     substance abuse  alcohol use,   Endo/Other  negative endocrine ROSdiabetes, Type 2, Oral Hypoglycemic Agents  Renal/GU Renal InsufficiencyRenal disease  negative genitourinary   Musculoskeletal   Abdominal   Peds  Hematology negative hematology ROS (+) anemia ,   Anesthesia Other Findings   Reproductive/Obstetrics negative OB ROS                           Anesthesia Physical Anesthesia Plan  ASA: II  Anesthesia Plan: General   Post-op Pain Management:    Induction: Intravenous  PONV Risk Score and Plan: 2 and Propofol infusion and Ondansetron  Airway Management Planned: Oral ETT  Additional Equipment:   Intra-op Plan:   Post-operative Plan: Extubation in OR  Informed Consent: I have reviewed the patients History and Physical, chart, labs and discussed the procedure including the risks, benefits and alternatives for the proposed anesthesia with the patient or authorized representative who has indicated his/her understanding and acceptance.   Dental advisory given  Plan Discussed with: CRNA  Anesthesia Plan Comments:        Anesthesia Quick Evaluation

## 2017-09-30 ENCOUNTER — Encounter (HOSPITAL_COMMUNITY): Payer: Self-pay | Admitting: Ophthalmology

## 2017-10-19 ENCOUNTER — Encounter (INDEPENDENT_AMBULATORY_CARE_PROVIDER_SITE_OTHER): Payer: Self-pay | Admitting: Physician Assistant

## 2017-10-19 ENCOUNTER — Ambulatory Visit (INDEPENDENT_AMBULATORY_CARE_PROVIDER_SITE_OTHER): Payer: Medicare Other | Admitting: Physician Assistant

## 2017-10-19 ENCOUNTER — Other Ambulatory Visit: Payer: Self-pay

## 2017-10-19 VITALS — BP 173/74 | HR 67 | Temp 98.0°F | Ht 61.0 in | Wt 105.6 lb

## 2017-10-19 DIAGNOSIS — I10 Essential (primary) hypertension: Secondary | ICD-10-CM | POA: Diagnosis not present

## 2017-10-19 NOTE — Progress Notes (Signed)
Subjective:  Patient ID: [name], male    DOB: [birth year]  Age: 77 y.o. MRN: [id number]  CC: f/u HTN  HPI Devere Nengis a 77 y.o.malewith a medical history of HTN,Hypertriglyceridemia,DM2, CKD3,anemia,blindness, and partial hearing loss presents to f/u on HTN. Last BP 177/80 mmHg nearly one month ago. BP 173/74 mmHg today. Does not know if he is taking Norvasc and Coreg as directed since he only takes the pills given to him at the nursing home. States he feels well and denies any symptoms at today's visit. He is accompanied by a live interpreter and an intern that state they will call here with information as to whether or not patient is taking anti-hypertensives. Pt is a poor historian and poor Metallurgist.      Outpatient Medications Prior to Visit  Medication Sig Dispense Refill  . albuterol (PROVENTIL HFA;VENTOLIN HFA) 108 (90 Base) MCG/ACT inhaler Inhale 2 puffs into the lungs every 6 (six) hours as needed for wheezing or shortness of breath. 1 Inhaler 2  . amLODipine (NORVASC) 10 MG tablet Take 1 tablet (10 mg total) by mouth daily. 30 tablet 5  . aspirin 81 MG chewable tablet Chew 1 tablet (81 mg total) by mouth daily. 30 tablet 11  . carvedilol (COREG) 6.25 MG tablet Take 1 tablet (6.25 mg total) by mouth 2 (two) times daily with a meal. 60 tablet 5  . ferrous gluconate (FERGON) 324 MG tablet Take 1 tablet (324 mg total) by mouth daily with breakfast. 30 tablet 5  . folic acid (FOLVITE) 1 MG tablet Take 1 tablet (1 mg total) by mouth daily. 30 tablet 5  . glimepiride (AMARYL) 4 MG tablet Take 1 tablet (4 mg total) by mouth daily before breakfast. 30 tablet 5  . Multiple Vitamins-Minerals (THERA-M PO) Take 1 tablet by mouth daily.    Marland Kitchen ofloxacin (OCUFLOX) 0.3 % ophthalmic solution Place 1 drop into the left eye 4 (four) times daily.    . prednisoLONE acetate (PRED FORTE) 1 % ophthalmic suspension Place 1 drop into the left eye 4 (four) times daily. Place 1 drop in left eye 4  times daily. Place 1 drop in right eye 3 times daily for 7 days starting 05/03-517    . thiamine (VITAMIN B-1) 100 MG tablet Take 100 mg by mouth daily.    . Multiple Vitamin (MULTIVITAMIN WITH MINERALS) TABS tablet Take 1 tablet by mouth daily. (Patient not taking: Reported on 09/28/2017) 30 tablet 5   No facility-administered medications prior to visit.      ROS Review of Systems  Constitutional: Negative for chills, fever and malaise/fatigue.  Eyes: Negative for blurred vision.  Respiratory: Negative for shortness of breath.   Cardiovascular: Negative for chest pain and palpitations.  Gastrointestinal: Negative for abdominal pain and nausea.  Genitourinary: Negative for dysuria and hematuria.  Musculoskeletal: Negative for joint pain and myalgias.  Skin: Negative for rash.  Neurological: Negative for tingling and headaches.  Psychiatric/Behavioral: Negative for depression. The patient is not nervous/anxious.     Objective:  BP (!) 173/74 (BP Location: Right Arm, Patient Position: Sitting, Cuff Size: Small)   Pulse 67   Temp 98 F (36.7 C) (Oral)   Ht 5\' 1"  (1.549 m)   Wt 105 lb 9.6 oz (47.9 kg)   SpO2 100%   BMI 19.95 kg/m   BP/Weight 10/19/2017 8/56/3149 7/0/2637  Systolic BP 858 850 277  Diastolic BP 74 64 80  Wt. (Lbs) 105.6 100 100  BMI 19.95 18.89 18.88  Physical Exam  Constitutional: He is oriented to person, place, and time.  Thin, frail, NAD, polite  HENT:  Head: Normocephalic and atraumatic.  Eyes: No scleral icterus.  Neck: Normal range of motion. Neck supple. No thyromegaly present.  Cardiovascular: Normal rate, regular rhythm and normal heart sounds. Exam reveals no gallop and no friction rub.  No murmur heard. No carotid bruit bilaterally  Pulmonary/Chest: Effort normal and breath sounds normal.  Abdominal: Soft. Bowel sounds are normal. There is no tenderness.  Musculoskeletal: He exhibits no edema.  Neurological: He is alert and oriented to  person, place, and time.  Skin: Skin is warm and dry. No rash noted. No erythema. No pallor.  Psychiatric: He has a normal mood and affect. His behavior is normal. Thought content normal.  Vitals reviewed.    Assessment & Plan:   1. Hypertension, unspecified type - Uncontrolled. Pt is accompanied by a live interpreter and an intern that state they will call here with information as to whether or not patient is taking anti-hypertensives.     Follow-up: Return in about 6 weeks (around 11/30/2017) for HTN.[name] PA

## 2017-10-19 NOTE — Patient Instructions (Signed)

## 2017-10-27 ENCOUNTER — Telehealth (INDEPENDENT_AMBULATORY_CARE_PROVIDER_SITE_OTHER): Payer: Self-pay | Admitting: Physician Assistant

## 2017-10-27 ENCOUNTER — Other Ambulatory Visit (INDEPENDENT_AMBULATORY_CARE_PROVIDER_SITE_OTHER): Payer: Self-pay | Admitting: Physician Assistant

## 2017-10-27 MED ORDER — HYDRALAZINE HCL 10 MG PO TABS: 10.0000 mg | ORAL_TABLET | Freq: Three times a day (TID) | ORAL | 5 refills | Status: DC

## 2017-10-27 NOTE — Telephone Encounter (Signed)
Timothy [name] from the [facility] came in to inform PCP that pt has been correctly taking his medications. [phone]

## 2017-10-27 NOTE — Telephone Encounter (Signed)
I have sent hydralazine 10 mg one pill TID to [place]. Please notify his nursing home or liasion/interpreter

## 2017-10-28 NOTE — Telephone Encounter (Signed)
Timothy [name]- administrator at facility is aware of hydralazine 10 mg one pill TID. Called back instructions to CMA to ensure understanding. [name], CMA

## 2017-11-30 ENCOUNTER — Other Ambulatory Visit: Payer: Self-pay

## 2017-11-30 ENCOUNTER — Ambulatory Visit (INDEPENDENT_AMBULATORY_CARE_PROVIDER_SITE_OTHER): Payer: Medicare Other | Admitting: Physician Assistant

## 2017-11-30 ENCOUNTER — Encounter (INDEPENDENT_AMBULATORY_CARE_PROVIDER_SITE_OTHER): Payer: Self-pay | Admitting: Physician Assistant

## 2017-11-30 VITALS — BP 170/75 | HR 63 | Temp 98.0°F | Ht 61.0 in | Wt 108.2 lb

## 2017-11-30 DIAGNOSIS — E781 Pure hyperglyceridemia: Secondary | ICD-10-CM | POA: Diagnosis not present

## 2017-11-30 DIAGNOSIS — I7090 Unspecified atherosclerosis: Secondary | ICD-10-CM

## 2017-11-30 DIAGNOSIS — R05 Cough: Secondary | ICD-10-CM

## 2017-11-30 DIAGNOSIS — R059 Cough, unspecified: Secondary | ICD-10-CM

## 2017-11-30 DIAGNOSIS — I10 Essential (primary) hypertension: Secondary | ICD-10-CM | POA: Diagnosis not present

## 2017-11-30 MED ORDER — ATORVASTATIN CALCIUM 20 MG PO TABS: 20.0000 mg | ORAL_TABLET | Freq: Every day | ORAL | 3 refills | Status: DC

## 2017-11-30 MED ORDER — AZITHROMYCIN 250 MG PO TABS: ORAL_TABLET | ORAL | 0 refills | Status: DC

## 2017-11-30 MED ORDER — OMEGA-3-ACID ETHYL ESTERS 1 G PO CAPS: 2.0000 g | ORAL_CAPSULE | Freq: Two times a day (BID) | ORAL | 1 refills | Status: DC

## 2017-11-30 MED ORDER — ISOSORBIDE MONONITRATE ER 30 MG PO TB24: 30.0000 mg | ORAL_TABLET | Freq: Every day | ORAL | 5 refills | Status: DC

## 2017-11-30 NOTE — Patient Instructions (Signed)
Atherosclerosis °Atherosclerosis is narrowing and hardening of the blood vessels (arteries). Arteries are tubes that carry blood that contains oxygen from the heart to all parts of the body. Arteries can become narrow or clogged with a buildup of fat, cholesterol, calcium, or other substances (plaque). Plaque decreases the amount of blood that can flow through the artery. °Atherosclerosis can affect any artery in the body, including: °· Heart arteries (coronary artery disease), which may cause heart attack. °· Brain arteries, which may cause stroke. °· Leg, arm, and pelvis arteries (peripheral artery disease), which may cause pain and numbness. °· Kidney arteries, which may cause kidney (renal) failure. ° °Treatment may slow the disease and prevent further damage to the heart, brain, peripheral arteries, and kidneys. °What are the causes? °Atherosclerosis develops when plaque forms in an artery. This damages the inside wall of the artery. Over time, the plaque grows and hardens. It may break through the artery wall. This causes a blood clot to form over the break, which narrows the artery more. The clot may also break loose and travel to other arteries, causing more damage. °What increases the risk? °This condition is more likely to develop in people who: °· Are middle age or older. °· Have a family history of atherosclerosis. °· Have high cholesterol. °· Have high blood fats (triglycerides). °· Have diabetes. °· Are overweight. °· Smoke tobacco. °· Do not exercise enough. °· Have a substance in the blood that indicates increased levels of inflammation in the body (C-reactive protein, or CRP). °· Have sleep apnea. °· Are stressed. °· Drink too much alcohol. ° °What are the signs or symptoms? °This condition may not cause any symptoms. If you do have symptoms, they are caused by damage to an area of your body that is not getting enough blood. The following symptoms are possible: °· Coronary artery disease may cause  chest pain and shortness of breath. °· Decreased blood supply to your brain may cause a stroke. Signs and symptoms of stroke may include sudden: °? Weakness on one side of the body. °? Confusion. °? Changes in vision. °? Inability to speak or understand speech. °? Loss of balance, coordination, or ability to walk. °? Severe headache. °? Loss of consciousness. °· Peripheral artery disease may cause pain and numbness, often in the legs and hips. °· Renal failure may cause fatigue, nausea, swelling, and itchy skin. ° °How is this diagnosed? °This condition is diagnosed based on your medical history and a physical exam. During the exam, your health care provider will check your pulses and listen for a "whooshing" sound over your arteries (bruit). You may have tests, such as: °· Blood tests to check your levels of cholesterol, triglycerides, and CRP. °· Electrocardiogram (ECG) to check for heart damage. °· Chest X-ray to see if your heart is enlarged, which is a sign of heart failure. °· Stress test to see how your heart reacts to exercise. °· Echocardiogram to get images of the inside of your heart. °· Ankle-Brachial index to compare blood pressure in your arms to blood pressure in your ankles. °· Ultrasound of your peripheral arteries to check blood flow. °· CT scan to check for damage to your heart or brain. °· X-rays of blood vessels after dye has been injected (angiogram) to check blood flow. ° °How is this treated? °Treatment starts with lifestyle changes, which may include: °· Changing your diet. °· Losing weight. °· Reducing stress. °· Exercising and being more physically active. °· Not smoking. ° °  You also may need medicine to: °· Lower triglycerides and cholesterol. °· Lower and control blood pressure. °· Prevent blood clots. °· Lower inflammation in your body. °· Lower and control your blood sugar. ° °Sometimes, surgery is needed to remove plaque, widen your artery, or create a new path for your blood  (bypass). Surgical treatment may include: °· Removing plaque from an artery (endarterectomy). °· Opening a narrowed heart artery (angioplasty). °· Heart or peripheral artery bypass graft surgery. ° °Follow these instructions at home: °Lifestyle ° °· Eat a heart-healthy diet. Talk to your health care provider or a diet specialist (dietitian) if you need help. A heart-healthy diet includes: °? Limiting unhealthy fats and increasing healthy fats. Some examples of healthy fats are olive oil and canola oil. °? Eating plant-based foods, such as fruits, vegetables, nuts, legumes, and whole grains. °· Follow an exercise program as told by your health care provider. °· Maintain a healthy weight. Lose weight if directed by your health care provider. °· Rest when you are tired. °· Learn to manage your stress. °· Do not use any tobacco products, such as cigarettes, chewing tobacco, and e-cigarettes. If you need help quitting, ask your health care provider. °· Limit alcohol intake to no more than 1 drink a day for nonpregnant women and 2 drinks a day for men. One drink equals 12 oz of beer, 5 oz of wine, or 1½ oz of hard liquor. °· Do not abuse drugs. °General instructions °· Take over-the-counter and prescription medicines only as told by your health care provider. °· Manage other health conditions as told by your health care provider. °· Keep all follow-up visits as told by your health care provider. This is important. °Contact a health care provider if: °· You have chest pain or discomfort. This includes squeezing chest pain that may feel like indigestion (angina). °· You have shortness of breath. °· You have an irregular heartbeat. °· You have unexplained fatigue. °· You have unexplained pain or numbness in an arm, leg, or hip. °· You have nausea, swelling of your hands or feet, and itchy skin. °Get help right away if: °· You have symptoms of a heart attack, such as: °? Chest pain. °? Shortness of breath. °? Pain in your  neck, jaw, arms, back, or stomach. °? Cold sweat. °? Nausea. °? Light-headedness. °· You have symptoms of a stroke, such as sudden: °? Weakness on one side of your body. °? Confusion. °? Changes in vision. °? Inability to speak or understand speech. °? Loss of balance, coordination, or ability to walk. °? Severe headache. °? Loss of consciousness. °These symptoms may represent a serious problem that is an emergency. Do not wait to see if the symptoms will go away. Get medical help right away. Call your local emergency services (911 in the [place] Do not drive yourself to the hospital. °This information is not intended to replace advice given to you by your health care provider. Make sure you discuss any questions you have with your health care provider. °Document Released: 07/26/2003 Document Revised: 10/11/2015 Document Reviewed: 03/26/2015 °Elsevier Interactive Patient Education © 2018 [employer] ° °

## 2017-11-30 NOTE — Progress Notes (Signed)
Subjective:  Patient ID: Timothy [name], male    DOB: [birth year]  Age: 77 y.o. MRN: [id number]  CC: HTN   HPI Issachar Nengis a 77 y.o.malewith a medical history of HTN,Hypertriglyceridemia,DM2, CKD3,anemia,blindness, atherosclerosis of pelvic/proximal femoral/coronary artery, and partial hearing loss presentsto f/u on HTN. Last three blood pressures in chronological order over the last three months including today are 177/80 mmHg, 173/74 mmHg, and 170/75 mmHg. Says he was not given any medications this morning at his nursing facility at [facility]. There was also discrepancy at the last visit nearly six weeks ago as to whether or not patient was taking Norvasc and Carvedilol at all. Telephonic phone call back to our clinic confirmed patient was taking Norvasc and Carvedilol. As a result, Hydralazine 10 mg TID was prescribed due to his renal clearance.     Pt has also been coughing "for a while" now. CT chest wo contrast in 07/09/17 revealed pulmonary bronchiectasis that was severe in the "bilateral middle and left lower lobes". Patchy and nodular right greater than left upper lobe opacity compatible with acute distal airway infection. Superimposed more pronounced lingula and left lower lobe opacity probably represents a combination of chronic and recurrent peribronchial infection in the setting of bronchiectasis, such as MAI. Has not seen a pulmonologist. Does not complain of any other symptoms.     *Live interpreter used for this encounter.   Outpatient Medications Prior to Visit  Medication Sig Dispense Refill  . albuterol (PROVENTIL HFA;VENTOLIN HFA) 108 (90 Base) MCG/ACT inhaler Inhale 2 puffs into the lungs every 6 (six) hours as needed for wheezing or shortness of breath. 1 Inhaler 2  . amLODipine (NORVASC) 10 MG tablet Take 1 tablet (10 mg total) by mouth daily. 30 tablet 5  . aspirin 81 MG chewable tablet Chew 1 tablet (81 mg total) by mouth daily. 30 tablet 11  . carvedilol (COREG)  6.25 MG tablet Take 1 tablet (6.25 mg total) by mouth 2 (two) times daily with a meal. 60 tablet 5  . ferrous gluconate (FERGON) 324 MG tablet Take 1 tablet (324 mg total) by mouth daily with breakfast. 30 tablet 5  . folic acid (FOLVITE) 1 MG tablet Take 1 tablet (1 mg total) by mouth daily. 30 tablet 5  . glimepiride (AMARYL) 4 MG tablet Take 1 tablet (4 mg total) by mouth daily before breakfast. 30 tablet 5  . hydrALAZINE (APRESOLINE) 10 MG tablet Take 1 tablet (10 mg total) by mouth 3 (three) times daily. 90 tablet 5  . Multiple Vitamin (MULTIVITAMIN WITH MINERALS) TABS tablet Take 1 tablet by mouth daily. 30 tablet 5  . Multiple Vitamins-Minerals (THERA-M PO) Take 1 tablet by mouth daily.    Marland Kitchen ofloxacin (OCUFLOX) 0.3 % ophthalmic solution Place 1 drop into the left eye 4 (four) times daily.    . prednisoLONE acetate (PRED FORTE) 1 % ophthalmic suspension Place 1 drop into the left eye 4 (four) times daily. Place 1 drop in left eye 4 times daily. Place 1 drop in right eye 3 times daily for 7 days starting 05/03-517    . thiamine (VITAMIN B-1) 100 MG tablet Take 100 mg by mouth daily.     No facility-administered medications prior to visit.      ROS Review of Systems  Constitutional: Negative for chills, fever and malaise/fatigue.  Eyes: Negative for blurred vision.  Respiratory: Negative for shortness of breath.   Cardiovascular: Negative for chest pain and palpitations.  Gastrointestinal: Negative for abdominal pain and  nausea.  Genitourinary: Negative for dysuria and hematuria.  Musculoskeletal: Negative for joint pain and myalgias.  Skin: Negative for rash.  Neurological: Negative for tingling and headaches.  Psychiatric/Behavioral: Negative for depression. The patient is not nervous/anxious.     Objective:  BP (!) 170/75 (BP Location: Right Arm, Patient Position: Sitting, Cuff Size: Small)   Pulse 63   Temp 98 F (36.7 C) (Oral)   Ht 5\' 1"  (1.549 m)   Wt 108 lb 3.2 oz  (49.1 kg)   SpO2 100%   BMI 20.44 kg/m   BP/Weight 11/30/2017 10/19/2017 9/51/8841  Systolic BP 660 630 160  Diastolic BP 75 74 64  Wt. (Lbs) 108.2 105.6 100  BMI 20.44 19.95 18.89      Physical Exam  Constitutional: He is oriented to person, place, and time.  thin, NAD, polite, occasional cough  HENT:  Head: Normocephalic and atraumatic.  Eyes: No scleral icterus.  Neck: Normal range of motion. Neck supple. No thyromegaly present.  Cardiovascular: Normal rate, regular rhythm and normal heart sounds.  Pulmonary/Chest: Effort normal and breath sounds normal. No stridor. No respiratory distress. He has no wheezes. He has no rales.  Musculoskeletal: He exhibits no edema.  Neurological: He is alert and oriented to person, place, and time.  Skin: Skin is warm and dry. No rash noted. No erythema. No pallor.  Psychiatric: He has a normal mood and affect. His behavior is normal. Thought content normal.  Vitals reviewed.    Assessment & Plan:   1. Hypertension, unspecified type -Still uncontrolled. 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 - Begin Isosorbide 30 mg ER, qday x30 tablets #30 five refills.  [facility] at [phone]. Reached supervisor Wells Guiles and we asked for a fax of pt's medication list. Fax showed medication lists here and at [facility].  2. Atherosclerosis - omega-3 acid ethyl esters (LOVAZA) 1 g capsule; Take 2 capsules (2 g total) by mouth 2 (two) times daily.  Dispense: 90 capsule; Refill: 1 - atorvastatin (LIPITOR) 20 MG tablet; Take 1 tablet (20 mg total) by mouth daily.  Dispense: 90 tablet; Refill: 3  3. Hypertriglyceridemia - omega-3 acid ethyl esters (LOVAZA) 1 g capsule; Take 2 capsules (2 g total) by mouth 2 (two) times daily.  Dispense: 90 capsule; Refill: 1 - atorvastatin (LIPITOR) 20 MG tablet; Take 1 tablet (20 mg total) by mouth daily.  Dispense: 90 tablet; Refill: 3  4. Cough - Begin azithromycin  (ZITHROMAX) 250 MG tablet; Take two pills on first day, then one pill daily thereafter.  Dispense: 6 tablet; Refill: 0 - Ambulatory referral to Pulmonology. Address CT findings and bronchiectasis.    Meds ordered this encounter  Medications  . omega-3 acid ethyl esters (LOVAZA) 1 g capsule    Sig: Take 2 capsules (2 g total) by mouth 2 (two) times daily.    Dispense:  90 capsule    Refill:  1    Order Specific Question:   Supervising Provider    Answer:   Charlott Rakes [[id number]]  . atorvastatin (LIPITOR) 20 MG tablet    Sig: Take 1 tablet (20 mg total) by mouth daily.    Dispense:  90 tablet    Refill:  3    Order Specific Question:   Supervising Provider    Answer:   Charlott Rakes [[id number]]  . azithromycin (ZITHROMAX) 250 MG tablet    Sig: Take two pills on first day, then one pill daily thereafter.    Dispense:  6 tablet    Refill:  0    Order Specific Question:   Supervising Provider    Answer:   Charlott Rakes [[id number]]  . isosorbide mononitrate (IMDUR) 30 MG 24 hr tablet    Sig: Take 1 tablet (30 mg total) by mouth daily.    Dispense:  30 tablet    Refill:  5    Order Specific Question:   Supervising Provider    Answer:   Charlott Rakes [[id number]]    Follow-up: Return in about 1 month (around 12/28/2017) for HTN.   [name] PA

## 2017-12-01 ENCOUNTER — Inpatient Hospital Stay (HOSPITAL_COMMUNITY)
Admission: EM | Admit: 2017-12-01 | Discharge: 2017-12-06 | DRG: 682 | Disposition: A | Discharge status: Skilled Nursing Facility | Payer: Medicare Other | Source: Skilled Nursing Facility | Attending: Internal Medicine | Admitting: Internal Medicine

## 2017-12-01 ENCOUNTER — Emergency Department (HOSPITAL_COMMUNITY): Payer: Medicare Other

## 2017-12-01 ENCOUNTER — Other Ambulatory Visit (INDEPENDENT_AMBULATORY_CARE_PROVIDER_SITE_OTHER): Payer: Self-pay | Admitting: Physician Assistant

## 2017-12-01 ENCOUNTER — Telehealth (INDEPENDENT_AMBULATORY_CARE_PROVIDER_SITE_OTHER): Payer: Self-pay

## 2017-12-01 ENCOUNTER — Encounter (HOSPITAL_COMMUNITY): Payer: Self-pay | Admitting: Emergency Medicine

## 2017-12-01 DIAGNOSIS — H9190 Unspecified hearing loss, unspecified ear: Secondary | ICD-10-CM | POA: Diagnosis present

## 2017-12-01 DIAGNOSIS — I129 Hypertensive chronic kidney disease with stage 1 through stage 4 chronic kidney disease, or unspecified chronic kidney disease: Secondary | ICD-10-CM | POA: Diagnosis present

## 2017-12-01 DIAGNOSIS — N183 Chronic kidney disease, stage 3 unspecified: Secondary | ICD-10-CM

## 2017-12-01 DIAGNOSIS — F0390 Unspecified dementia without behavioral disturbance: Secondary | ICD-10-CM | POA: Diagnosis present

## 2017-12-01 DIAGNOSIS — R140 Abdominal distension (gaseous): Secondary | ICD-10-CM

## 2017-12-01 DIAGNOSIS — K5900 Constipation, unspecified: Secondary | ICD-10-CM | POA: Diagnosis present

## 2017-12-01 DIAGNOSIS — N189 Chronic kidney disease, unspecified: Secondary | ICD-10-CM

## 2017-12-01 DIAGNOSIS — Z7952 Long term (current) use of systemic steroids: Secondary | ICD-10-CM

## 2017-12-01 DIAGNOSIS — Z87891 Personal history of nicotine dependence: Secondary | ICD-10-CM

## 2017-12-01 DIAGNOSIS — J90 Pleural effusion, not elsewhere classified: Secondary | ICD-10-CM | POA: Diagnosis present

## 2017-12-01 DIAGNOSIS — E872 Acidosis: Secondary | ICD-10-CM | POA: Diagnosis present

## 2017-12-01 DIAGNOSIS — Z9841 Cataract extraction status, right eye: Secondary | ICD-10-CM

## 2017-12-01 DIAGNOSIS — H548 Legal blindness, as defined in USA: Secondary | ICD-10-CM | POA: Diagnosis present

## 2017-12-01 DIAGNOSIS — R809 Proteinuria, unspecified: Secondary | ICD-10-CM | POA: Diagnosis present

## 2017-12-01 DIAGNOSIS — E875 Hyperkalemia: Secondary | ICD-10-CM | POA: Diagnosis not present

## 2017-12-01 DIAGNOSIS — Z79899 Other long term (current) drug therapy: Secondary | ICD-10-CM | POA: Diagnosis not present

## 2017-12-01 DIAGNOSIS — R339 Retention of urine, unspecified: Secondary | ICD-10-CM | POA: Diagnosis not present

## 2017-12-01 DIAGNOSIS — Z9842 Cataract extraction status, left eye: Secondary | ICD-10-CM | POA: Diagnosis not present

## 2017-12-01 DIAGNOSIS — H269 Unspecified cataract: Secondary | ICD-10-CM | POA: Diagnosis present

## 2017-12-01 DIAGNOSIS — D72829 Elevated white blood cell count, unspecified: Secondary | ICD-10-CM

## 2017-12-01 DIAGNOSIS — F1010 Alcohol abuse, uncomplicated: Secondary | ICD-10-CM | POA: Diagnosis present

## 2017-12-01 DIAGNOSIS — Z7982 Long term (current) use of aspirin: Secondary | ICD-10-CM | POA: Diagnosis not present

## 2017-12-01 DIAGNOSIS — E1129 Type 2 diabetes mellitus with other diabetic kidney complication: Secondary | ICD-10-CM | POA: Diagnosis present

## 2017-12-01 DIAGNOSIS — E1122 Type 2 diabetes mellitus with diabetic chronic kidney disease: Secondary | ICD-10-CM | POA: Diagnosis not present

## 2017-12-01 DIAGNOSIS — Z9889 Other specified postprocedural states: Secondary | ICD-10-CM

## 2017-12-01 DIAGNOSIS — R0600 Dyspnea, unspecified: Secondary | ICD-10-CM

## 2017-12-01 DIAGNOSIS — D631 Anemia in chronic kidney disease: Secondary | ICD-10-CM | POA: Diagnosis present

## 2017-12-01 DIAGNOSIS — H5711 Ocular pain, right eye: Secondary | ICD-10-CM | POA: Diagnosis present

## 2017-12-01 DIAGNOSIS — D638 Anemia in other chronic diseases classified elsewhere: Secondary | ICD-10-CM | POA: Diagnosis present

## 2017-12-01 DIAGNOSIS — E11649 Type 2 diabetes mellitus with hypoglycemia without coma: Secondary | ICD-10-CM | POA: Diagnosis not present

## 2017-12-01 DIAGNOSIS — J189 Pneumonia, unspecified organism: Secondary | ICD-10-CM | POA: Diagnosis present

## 2017-12-01 DIAGNOSIS — N179 Acute kidney failure, unspecified: Secondary | ICD-10-CM | POA: Diagnosis not present

## 2017-12-01 LAB — URINALYSIS, ROUTINE W REFLEX MICROSCOPIC
Bilirubin Urine: NEGATIVE
Glucose, UA: 50 mg/dL — AB
Hgb urine dipstick: NEGATIVE
Ketones, ur: NEGATIVE mg/dL
Leukocytes, UA: NEGATIVE
Nitrite: NEGATIVE
SPECIFIC GRAVITY, URINE: 1.014 (ref 1.005–1.030)
pH: 5 (ref 5.0–8.0)

## 2017-12-01 LAB — CBC WITH DIFFERENTIAL/PLATELET
BASOS ABS: 0 10*3/uL (ref 0.0–0.2)
BASOS: 0 %
EOS (ABSOLUTE): 0.6 10*3/uL — AB (ref 0.0–0.4)
Eos: 5 %
Hematocrit: 30.7 % — ABNORMAL LOW (ref 37.5–51.0)
Hemoglobin: 9.8 g/dL — ABNORMAL LOW (ref 13.0–17.7)
IMMATURE GRANULOCYTES: 0 %
Immature Grans (Abs): 0 10*3/uL (ref 0.0–0.1)
Lymphocytes Absolute: 2 10*3/uL (ref 0.7–3.1)
Lymphs: 16 %
MCH: 24.5 pg — ABNORMAL LOW (ref 26.6–33.0)
MCHC: 31.9 g/dL (ref 31.5–35.7)
MCV: 77 fL — AB (ref 79–97)
MONOS ABS: 0.8 10*3/uL (ref 0.1–0.9)
Monocytes: 6 %
NEUTROS PCT: 73 %
Neutrophils Absolute: 9.2 10*3/uL — ABNORMAL HIGH (ref 1.4–7.0)
PLATELETS: 325 10*3/uL (ref 150–450)
RBC: 4 x10E6/uL — AB (ref 4.14–5.80)
RDW: 16.1 % — AB (ref 12.3–15.4)
WBC: 12.5 10*3/uL — AB (ref 3.4–10.8)

## 2017-12-01 LAB — COMPREHENSIVE METABOLIC PANEL
ALK PHOS: 78 U/L (ref 38–126)
ALT: 7 U/L (ref 0–44)
AST: 18 U/L (ref 15–41)
Albumin: 3.6 g/dL (ref 3.5–5.0)
Anion gap: 8 (ref 5–15)
BUN: 48 mg/dL — ABNORMAL HIGH (ref 8–23)
CALCIUM: 8.7 mg/dL — AB (ref 8.9–10.3)
CHLORIDE: 109 mmol/L (ref 98–111)
CO2: 22 mmol/L (ref 22–32)
CREATININE: 3.37 mg/dL — AB (ref 0.61–1.24)
GFR calc Af Amer: 19 mL/min — ABNORMAL LOW (ref >60)
GFR calc non Af Amer: 16 mL/min — ABNORMAL LOW (ref >60)
GLUCOSE: 165 mg/dL — AB (ref 70–99)
Potassium: 5.3 mmol/L — ABNORMAL HIGH (ref 3.5–5.1)
SODIUM: 139 mmol/L (ref 135–145)
Total Bilirubin: 0.3 mg/dL (ref 0.3–1.2)
Total Protein: 7.5 g/dL (ref 6.5–8.1)

## 2017-12-01 LAB — CBC
HCT: 31.2 % — ABNORMAL LOW (ref 39.0–52.0)
Hemoglobin: 9.6 g/dL — ABNORMAL LOW (ref 13.0–17.0)
MCH: 25.1 pg — AB (ref 26.0–34.0)
MCHC: 30.8 g/dL (ref 30.0–36.0)
MCV: 81.5 fL (ref 78.0–100.0)
PLATELETS: 362 10*3/uL (ref 150–400)
RBC: 3.83 MIL/uL — AB (ref 4.22–5.81)
RDW: 14.3 % (ref 11.5–15.5)
WBC: 12.2 10*3/uL — ABNORMAL HIGH (ref 4.0–10.5)

## 2017-12-01 LAB — BASIC METABOLIC PANEL
BUN/Creatinine Ratio: 15 (ref 10–24)
BUN: 38 mg/dL — ABNORMAL HIGH (ref 8–27)
CALCIUM: 9 mg/dL (ref 8.6–10.2)
CHLORIDE: 110 mmol/L — AB (ref 96–106)
CO2: 19 mmol/L — ABNORMAL LOW (ref 20–29)
Creatinine, Ser: 2.47 mg/dL — ABNORMAL HIGH (ref 0.76–1.27)
GFR calc non Af Amer: 24 mL/min/{1.73_m2} — ABNORMAL LOW (ref >59)
GFR, EST AFRICAN AMERICAN: 28 mL/min/{1.73_m2} — AB (ref >59)
Glucose: 193 mg/dL — ABNORMAL HIGH (ref 65–99)
POTASSIUM: 5.5 mmol/L — AB (ref 3.5–5.2)
Sodium: 141 mmol/L (ref 134–144)

## 2017-12-01 MED ORDER — HEPARIN SODIUM (PORCINE) 5000 UNIT/ML IJ SOLN
5000.0000 [IU] | Freq: Three times a day (TID) | INTRAMUSCULAR | Status: DC
  Administered 2017-12-02 – 2017-12-06 (×14): 5000 [IU] via SUBCUTANEOUS
  Filled 2017-12-01 (×14): qty 1

## 2017-12-01 MED ORDER — HYDRALAZINE HCL 10 MG PO TABS
10.0000 mg | ORAL_TABLET | Freq: Three times a day (TID) | ORAL | Status: DC
  Administered 2017-12-02 – 2017-12-06 (×14): 10 mg via ORAL
  Filled 2017-12-01 (×14): qty 1

## 2017-12-01 MED ORDER — ALBUTEROL SULFATE (2.5 MG/3ML) 0.083% IN NEBU: 2.5000 mg | INHALATION_SOLUTION | Freq: Four times a day (QID) | RESPIRATORY_TRACT | Status: DC | PRN

## 2017-12-01 MED ORDER — AMLODIPINE BESYLATE 5 MG PO TABS
10.0000 mg | ORAL_TABLET | Freq: Every day | ORAL | Status: DC
  Administered 2017-12-02 – 2017-12-06 (×5): 10 mg via ORAL
  Filled 2017-12-01 (×5): qty 2

## 2017-12-01 MED ORDER — FERROUS GLUCONATE 324 (38 FE) MG PO TABS
324.0000 mg | ORAL_TABLET | Freq: Every day | ORAL | Status: DC
  Administered 2017-12-02 – 2017-12-06 (×5): 324 mg via ORAL
  Filled 2017-12-01 (×5): qty 1

## 2017-12-01 MED ORDER — ONDANSETRON HCL 4 MG/2ML IJ SOLN: 4.0000 mg | Freq: Four times a day (QID) | INTRAMUSCULAR | Status: DC | PRN

## 2017-12-01 MED ORDER — ACETAMINOPHEN 325 MG PO TABS: 650.0000 mg | ORAL_TABLET | Freq: Four times a day (QID) | ORAL | Status: DC | PRN

## 2017-12-01 MED ORDER — ONDANSETRON HCL 4 MG PO TABS: 4.0000 mg | ORAL_TABLET | Freq: Four times a day (QID) | ORAL | Status: DC | PRN

## 2017-12-01 MED ORDER — CARVEDILOL 12.5 MG PO TABS
6.2500 mg | ORAL_TABLET | Freq: Two times a day (BID) | ORAL | Status: DC
  Administered 2017-12-02 – 2017-12-06 (×9): 6.25 mg via ORAL
  Filled 2017-12-01 (×9): qty 1

## 2017-12-01 MED ORDER — ACETAMINOPHEN 650 MG RE SUPP: 650.0000 mg | Freq: Four times a day (QID) | RECTAL | Status: DC | PRN

## 2017-12-01 MED ORDER — SODIUM CHLORIDE 0.9 % IV SOLN
INTRAVENOUS | Status: DC
  Administered 2017-12-02: via INTRAVENOUS

## 2017-12-01 NOTE — ED Triage Notes (Signed)
Pt arrives from [facility] with his case managers for c.o. Abnormal labs. Pt has been seen here previously for low creatinine. Labs noted on results tab show an elevated K and abnormal kidney function labs. Pt denies any urinary complaints. Denies pain anywhere besides his eyes. Denies dizziness. No other complaints.

## 2017-12-01 NOTE — ED Notes (Signed)
Pt has niece that works at [employer] with environmental services. She is also available for translating. Hcon [phone]

## 2017-12-01 NOTE — ED Notes (Signed)
Patient transported to X-ray 

## 2017-12-01 NOTE — H&P (Signed)
History and Physical    [name] WPY:[id number] DOB: 1940/12/22 DOA: 12/01/2017  Referring MD/NP/PA: Dr. Merrily Pew PCP: [name], PA-C  Patient coming from: Skilled nursing facility  Chief Complaint: Abnormal lab work  I have personally briefly reviewed patient's old medical records in [place]   HPI: Timothy [name] is a 77 y.o. male with medical history significant of HTN, CKD, anemia, diabetes mellitus type 2, and dementia; who presents after being found to have abnormal lab work.  History is obtained utilizing patient's niece as Optometrist services unavailable for his dialect of [place], but patient is a poor historian due to history of dementia.  At this time patient only complains of right eye pain.  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 Posey Pronto.  Prior to that appears back in March patient had been admitted for acute renal failure that seemed to improve with IV fluids.   ED Course: Upon admission to the emergency department patient was seen to be afebrile with blood pressures elevated up to 167/62, and all other vital signs maintained.  Labs revealed WBC 12.2, hemoglobin 9.8  Review of Systems  Unable to perform ROS: Dementia  Eyes: Positive for pain.  All other systems reviewed and are negative.   Past Medical History:  Diagnosis Date  . Alcohol abuse    Patient denies, but was shared with people from [facility] by his male roommate.  . Bilateral cataracts 04/08/2017  . DM (diabetes mellitus), type 2 with renal complications ([place])    uncertain when diagnosed  . HOH (hard of hearing)   . Hypertension   . Legally blind    B/L  . Poor vision     Past Surgical History:  Procedure Laterality Date  . MEMBRANE PEEL Right 09/29/2017   Procedure: MEMBRANE PEEL WITH AIR GAS;  Surgeon: Jalene Mullet, MD;   Location: [place];  Service: Ophthalmology;  Laterality: Right;  . PARS PLANA VITRECTOMY Left 06/29/2017   Procedure: PARS PLANA VITRECTOMY WITH 25 GAUGE AND MEMBRANE PEEL WITH GAS EXCHANGE, AIR SILICONE OIL AND PHACO COAGULATION, LASER;  Surgeon: Jalene Mullet, MD;  Location: [place];  Service: Ophthalmology;  Laterality: Left;  . PARS PLANA VITRECTOMY Right 09/18/2017   Procedure: PARS PLANA VITRECTOMY WITH 25 GAUGE MEMBRANE PILL WITH AIR GAS SILICONE OIL AND PHOTOCOAGULATION;  Surgeon: Jalene Mullet, MD;  Location: [place];  Service: Ophthalmology;  Laterality: Right;  . PARS PLANA VITRECTOMY Right 09/29/2017   Procedure: PARS PLANA VITRECTOMY WITH 25 GAUGE;  Surgeon: Jalene Mullet, MD;  Location: [place];  Service: Ophthalmology;  Laterality: Right;  . PHOTOCOAGULATION Right 09/29/2017   Procedure: PHOTOCOAGULATION;  Surgeon: Jalene Mullet, MD;  Location: [place];  Service: Ophthalmology;  Laterality: Right;  . REPAIR OF COMPLEX TRACTION RETINAL DETACHMENT Right 09/29/2017   Procedure: REPAIR OF COMPLEX TRACTION RETINAL DETACHMENT;  Surgeon: Jalene Mullet, MD;  Location: [place];  Service: Ophthalmology;  Laterality: Right;     reports that he quit smoking about 27 years ago. His smoking use included cigarettes. He has never used smokeless tobacco. He reports that he drinks alcohol. He reports that he does not use drugs.  No Known Allergies  No family history on file.  Prior to Admission medications   Medication Sig Start Date End Date Taking? Authorizing Provider  albuterol (PROVENTIL HFA;VENTOLIN HFA) 108 (90 Base) MCG/ACT  inhaler Inhale 2 puffs into the lungs every 6 (six) hours as needed for wheezing or shortness of breath. 08/24/17   [name], PA-C  amLODipine (NORVASC) 10 MG tablet Take 1 tablet (10 mg total) by mouth daily. 08/24/17   [name], PA-C  aspirin 81 MG chewable tablet Chew 1 tablet (81 mg total) by mouth daily. 08/24/17   [name], PA-C  atorvastatin  (LIPITOR) 20 MG tablet Take 1 tablet (20 mg total) by mouth daily. 11/30/17   [name], PA-C  azithromycin (ZITHROMAX) 250 MG tablet Take two pills on first day, then one pill daily thereafter. 11/30/17   [name], PA-C  carvedilol (COREG) 6.25 MG tablet Take 1 tablet (6.25 mg total) by mouth 2 (two) times daily with a meal. 08/24/17   [name], PA-C  ferrous gluconate [facility]) 324 MG tablet Take 1 tablet (324 mg total) by mouth daily with breakfast. 08/24/17   [name], PA-C  folic acid (FOLVITE) 1 MG tablet Take 1 tablet (1 mg total) by mouth daily. 08/24/17   [name], PA-C  glimepiride (AMARYL) 4 MG tablet Take 1 tablet (4 mg total) by mouth daily before breakfast. 08/24/17   [name], PA-C  hydrALAZINE (APRESOLINE) 10 MG tablet Take 1 tablet (10 mg total) by mouth 3 (three) times daily. 10/27/17   [name], PA-C  isosorbide mononitrate (IMDUR) 30 MG 24 hr tablet Take 1 tablet (30 mg total) by mouth daily. 11/30/17   [name], PA-C  Multiple Vitamin (MULTIVITAMIN WITH MINERALS) TABS tablet Take 1 tablet by mouth daily. 08/24/17   [name], PA-C  Multiple Vitamins-Minerals (THERA-M PO) Take 1 tablet by mouth daily.    [provider]  ofloxacin (OCUFLOX) 0.3 % ophthalmic solution Place 1 drop into the left eye 4 (four) times daily. 07/01/17 07/01/18  [provider]  omega-3 acid ethyl esters (LOVAZA) 1 g capsule Take 2 capsules (2 g total) by mouth 2 (two) times daily. 11/30/17 02/28/18  [name], PA-C  prednisoLONE acetate (PRED FORTE) 1 % ophthalmic suspension Place 1 drop into the left eye 4 (four) times daily. Place 1 drop in left eye 4 times daily. Place 1 drop in right eye 3 times daily for 7 days starting 05/03-517 07/01/17 07/01/18  [provider]  thiamine (VITAMIN B-1) 100 MG tablet Take 100 mg by mouth daily.    [provider]    Physical Exam:  Constitutional:  Elderly male who appears to be in NAD, calm, comfortable Vitals:   12/01/17 1721 12/01/17 2025 12/01/17 2130  BP: (!) 138/54 (!) 147/58 (!) 167/62  Pulse: 68 68 67  Resp: 16 18 14   Temp: 98.5 F (36.9 C) 98.4 F (36.9 C)   TempSrc: Oral Oral   SpO2: 100% 100% 100%   Eyes: Conjunctival injection of the right eye.  Cataract present of the left eye ENMT: Mucous membranes are moist. Posterior pharynx clear of any exudate or lesions poor dentition dentition.  Neck: normal, supple, no masses, no thyromegaly Respiratory: clear to auscultation bilaterally, no wheezing, no crackles. Normal respiratory effort. No accessory muscle use.  Cardiovascular: Regular rate and rhythm, no murmurs / rubs / gallops. No extremity edema. 2+ pedal pulses. No carotid bruits.  Abdomen: no tenderness, no masses palpated. No hepatosplenomegaly. Bowel sounds positive.  Musculoskeletal: no clubbing / cyanosis. No joint deformity upper and lower extremities. Good ROM, no contractures. Normal muscle tone.  Skin: no rashes, lesions, ulcers. No induration Neurologic: CN 2-12 grossly intact. Sensation intact, DTR normal. Strength 5/5 in all 4.  Psychiatric: Poor memory. Alert and oriented x person and place. Normal mood.     Labs on Admission: I have personally reviewed following labs and imaging studies  CBC: Recent Labs  Lab 11/30/17 1110 12/01/17 1842  WBC 12.5* 12.2*  NEUTROABS 9.2*  --   HGB 9.8* 9.6*  HCT 30.7* 31.2*  MCV 77* 81.5  PLT 325 601   Basic Metabolic Panel: Recent Labs  Lab 11/30/17 1110 12/01/17 1842  NA 141 139  K 5.5* 5.3*  CL 110* 109  CO2 19* 22  GLUCOSE 193* 165*  BUN 38* 48*  CREATININE 2.47* 3.37*  CALCIUM 9.0 8.7*   GFR: Estimated Creatinine Clearance: 12.3 mL/min (A) (by C-G formula based on SCr of 3.37 mg/dL (H)). Liver Function Tests: Recent Labs  Lab 12/01/17 1842  AST 18  ALT 7  ALKPHOS 78  BILITOT 0.3  PROT 7.5  ALBUMIN 3.6   No results for input(s):  LIPASE, AMYLASE in the last 168 hours. No results for input(s): AMMONIA in the last 168 hours. Coagulation Profile: No results for input(s): INR, PROTIME in the last 168 hours. Cardiac Enzymes: No results for input(s): CKTOTAL, CKMB, CKMBINDEX, TROPONINI in the last 168 hours. BNP (last 3 results) No results for input(s): PROBNP in the last 8760 hours. HbA1C: No results for input(s): HGBA1C in the last 72 hours. CBG: No results for input(s): GLUCAP in the last 168 hours. Lipid Profile: No results for input(s): CHOL, HDL, LDLCALC, TRIG, CHOLHDL, LDLDIRECT in the last 72 hours. Thyroid Function Tests: No results for input(s): TSH, T4TOTAL, FREET4, T3FREE, THYROIDAB in the last 72 hours. Anemia Panel: No results for input(s): VITAMINB12, FOLATE, FERRITIN, TIBC, IRON, RETICCTPCT in the last 72 hours. Urine analysis:    Component Value Date/Time   COLORURINE YELLOW 12/01/2017 1850   APPEARANCEUR HAZY (A) 12/01/2017 1850   LABSPEC 1.014 12/01/2017 1850   PHURINE 5.0 12/01/2017 1850   GLUCOSEU 50 (A) 12/01/2017 1850   HGBUR NEGATIVE 12/01/2017 1850   BILIRUBINUR NEGATIVE 12/01/2017 1850   KETONESUR NEGATIVE 12/01/2017 1850   PROTEINUR >=300 (A) 12/01/2017 1850   UROBILINOGEN 0.2 11/15/2013 0231   NITRITE NEGATIVE 12/01/2017 1850   LEUKOCYTESUR NEGATIVE 12/01/2017 1850   Sepsis Labs: No results found for this or any previous visit (from the past 240 hour(s)).   Radiological Exams on Admission: Dg Chest 2 View  Result Date: 12/01/2017 CLINICAL DATA:  Former smoker with diabetes and hypertension. Abnormal creatinine. Cough. EXAM: CHEST - 2 VIEW COMPARISON:  CT chest 07/09/2017 FINDINGS: The heart size and mediastinal contours are within normal limits. Mild aortic atherosclerosis without aneurysm. Streaky, coarsened interstitial lung markings at the left lung base would be consistent with patient's known bronchiectasis. The visualized skeletal structures are unremarkable. IMPRESSION: 1.  Streaky left basilar lung markings consistent with known bronchiectasis. 2. No pulmonary consolidation or CHF. 3. Mild aortic atherosclerosis. Electronically Signed   By: Ashley Royalty M.D.   On: 12/01/2017 21:11    EKG: Independently reviewed.  Normal sinus rhythm with signs of LVH  Assessment/Plan Acute renal failure superimposed on chronic kidney disease, proteinuria: Acute.  Patient presents with creatinine of 3.37 with BUN elevated up to 48.  Previous baseline creatinine noted to be around 1.8.  - Admit to a MedSurg bed - Strict intake and output  - Check urine protein, creatinine, and sodium - Check renal ultrasound  Hyperkalemia: Acute.Potassium noted to be 5.3 which is decreased from 5.5 with blood work checked yesterday. - IV fluids as tolerated - Recheck potassium in a.m.  Leukocytosis: WBC elevated 12.2.  Patient appears to be afebrile without signs of infection noted.  Unclear cause of symptoms. - Monitoring off antibiotics at this time  Essential hypertension - Continue Coreg, amlodipine, and hydralazine  Diabetes mellitus type 2 - Hypoglycemic protocol - Hold Amaryl - CBGs q. before meals and at bedtime  Anemia chronic kidney disease: Patient's baseline hemoglobin appears to range from 9 to 10 g/dL.  Patient presents with a hemoglobin of 9.6. - Continue to monitor  History of alcohol abuse: Patient currently in facility no reported access to alcohol.  DVT prophylaxis: heparin  Code Status: full  Family Communication: Patient's niece present at bedside to help translate Disposition Plan: Likely discharge back to skilled nursing facility once medically stable Consults called: Neophrology  Admission status: inpatient  Norval Morton MD [facility] Pager [phone]   If 7PM-7AM, please contact night-coverage [email or web address] Password [facility]  12/01/2017, 11:37 PM

## 2017-12-01 NOTE — Telephone Encounter (Signed)
Spoke with [employer], who was left in charge as Scientist, physiological and all other persons of authority were gone. cervante received message that patients serum creatinine is too elevated and it is recommended by PCP that he go to the ED. Cervante stated they would go ahead and send patient out. [name], CMA

## 2017-12-01 NOTE — ED Provider Notes (Signed)
Emergency Department Provider Note   I have reviewed the triage vital signs and the nursing notes.   HISTORY  Chief Complaint Abnormal Lab   HPI Timothy [name] is a 77 y.o. male who is a poor historian but through the interpreter sounds like the patient was recently admitted to the hospital for kidney failure and had improved with some fluids and a discharged to follow-up with nephrology which he never did.  He got his labs rechecked in the last couple days and is progressively worsened and is back almost as bad as it was before.  Urinating okay no shortness of breath.  No other associated symptoms. No other associated or modifying symptoms.    Past Medical History:  Diagnosis Date  . Alcohol abuse    Patient denies, but was shared with people from [facility] by his male roommate.  . Bilateral cataracts 04/08/2017  . DM (diabetes mellitus), type 2 with renal complications ([place])    uncertain when diagnosed  . HOH (hard of hearing)   . Hypertension   . Legally blind    B/L  . Poor vision     Patient Active Problem List   Diagnosis Date Noted  . 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 PNA (pneumonia) 07/09/2017  . HCAP (healthcare-associated pneumonia)   . Hypertension 06/26/2017  . Hypertensive emergency 06/25/2017  . Acute kidney injury superimposed on chronic kidney disease ([place]) 06/25/2017  . Hyperkalemia 06/25/2017  . 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 Left retinal detachment   . 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 Bilateral cataracts 04/08/2017  . DM (diabetes mellitus), type 2 with renal complications ([place])   . Alcohol abuse   . Poor vision   . Chronic kidney disease 02/13/2017  . 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 Epigastric pain 02/11/2017  . Fall at home 11/16/2013  . Closed TBI (traumatic brain injury) ([place]) 11/16/2013  . SDH (subdural hematoma) ([place]) 11/15/2013    Past Surgical  History:  Procedure Laterality Date  . MEMBRANE PEEL Right 09/29/2017   Procedure: MEMBRANE PEEL WITH AIR GAS;  Surgeon: Jalene Mullet, MD;  Location: [place];  Service: Ophthalmology;  Laterality: Right;  . PARS PLANA VITRECTOMY Left 06/29/2017   Procedure: PARS PLANA VITRECTOMY WITH 25 GAUGE AND MEMBRANE PEEL WITH GAS EXCHANGE, AIR SILICONE OIL AND PHACO COAGULATION, LASER;  Surgeon: Jalene Mullet, MD;  Location: [place];  Service: Ophthalmology;  Laterality: Left;  . PARS PLANA VITRECTOMY Right 09/18/2017   Procedure: PARS PLANA VITRECTOMY WITH 25 GAUGE MEMBRANE PILL WITH AIR GAS SILICONE OIL AND PHOTOCOAGULATION;  Surgeon: Jalene Mullet, MD;  Location: [place];  Service: Ophthalmology;  Laterality: Right;  . PARS PLANA VITRECTOMY Right 09/29/2017   Procedure: PARS PLANA VITRECTOMY WITH 25 GAUGE;  Surgeon: Jalene Mullet, MD;  Location: [place];  Service: Ophthalmology;  Laterality: Right;  . PHOTOCOAGULATION Right 09/29/2017   Procedure: PHOTOCOAGULATION;  Surgeon: Jalene Mullet, MD;  Location: [place];  Service: Ophthalmology;  Laterality: Right;  . REPAIR OF COMPLEX TRACTION RETINAL DETACHMENT Right 09/29/2017   Procedure: REPAIR OF COMPLEX TRACTION RETINAL DETACHMENT;  Surgeon: Jalene Mullet, MD;  Location: [place];  Service: Ophthalmology;  Laterality: Right;    Current Outpatient Rx  . Order #: [id number] Class: Normal  . Order #: [id number] Class: Normal  . Order #: [id number] Class: Normal  . Order #: [id number] Class: Normal  . Order #: [id number] Class: Normal  . Order #: [id number] Class: Normal  .  Order #: [id number] Class: Normal  . Order #: [id number] Class: Normal  . Order #: [id number] Class: Normal  . Order #: [id number] Class: Normal  . Order #: [id number] Class: Normal  . Order #: [id number] Class: Print  . Order #: [id number] Class: Historical Med  . Order #: [id number] Class: Historical Med  . Order #: [id number] Class: Normal  . Order #: [id number] Class: Historical Med  . Order #: [id number] Class:  Historical Med    Allergies Patient has no known allergies.  No family history on file.  Social History Social History   Tobacco Use  . Smoking status: Former Smoker    Types: Cigarettes    Last attempt to quit: 05/19/1990    Years since quitting: 27.5  . Smokeless tobacco: Never Used  Substance Use Topics  . 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 Drug use: No    Review of Systems  All other systems negative except as documented in the HPI. All pertinent positives and negatives as reviewed in the HPI. ____________________________________________   PHYSICAL EXAM:  VITAL SIGNS: ED Triage Vitals  Enc Vitals Group     BP 12/01/17 1721 (!) 138/54     Pulse Rate 12/01/17 1721 68     Resp 12/01/17 1721 16     Temp 12/01/17 1721 98.5 F (36.9 C)     Temp Source 12/01/17 1721 Oral     SpO2 12/01/17 1721 100 %    Constitutional: Alert and oriented. Well appearing and in no acute distress. Eyes: Conjunctivae are normal. PERRL. EOMI. Head: Atraumatic. Nose: No congestion/rhinnorhea. Mouth/Throat: Mucous membranes are moist.  Oropharynx non-erythematous. Neck: No stridor.  No meningeal signs.   Cardiovascular: Normal rate, regular rhythm. Good peripheral circulation. Grossly normal heart sounds.   Respiratory: Normal respiratory effort.  No retractions. Lungs CTAB. Gastrointestinal: Soft and nontender. No distention.  Musculoskeletal: No lower extremity tenderness nor edema. No gross deformities of extremities. Neurologic:  Normal speech and language. No gross focal neurologic deficits are appreciated.  Skin:  Skin is warm, dry and intact. No rash noted.  ____________________________________________   LABS (all labs ordered are listed, but only abnormal results are displayed)  Labs Reviewed  URINALYSIS, ROUTINE W REFLEX MICROSCOPIC - Abnormal; Notable for the following components:      Result  Value   APPearance HAZY (*)    Glucose, UA 50 (*)    Protein, ur >=300 (*)    Bacteria, UA RARE (*)    All other components within normal limits  COMPREHENSIVE METABOLIC PANEL - Abnormal; Notable for the following components:   Potassium 5.3 (*)    Glucose, Bld 165 (*)    BUN 48 (*)    Creatinine, Ser 3.37 (*)    Calcium 8.7 (*)    GFR calc non Af Amer 16 (*)    GFR calc Af Amer 19 (*)    All other components within normal limits  CBC - Abnormal; Notable for the following components:   WBC 12.2 (*)    RBC 3.83 (*)    Hemoglobin 9.6 (*)    HCT 31.2 (*)    MCH 25.1 (*)    All other components within normal limits   ____________________________________________  EKG   EKG Interpretation  Date/Time:    Ventricular Rate:    PR Interval:    QRS Duration:   QT Interval:    QTC Calculation:   R Axis:     Text Interpretation:  ____________________________________________  RADIOLOGY  Dg Chest 2 View  Result Date: 12/01/2017 CLINICAL DATA:  Former smoker with diabetes and hypertension. Abnormal creatinine. Cough. EXAM: CHEST - 2 VIEW COMPARISON:  CT chest 07/09/2017 FINDINGS: The heart size and mediastinal contours are within normal limits. Mild aortic atherosclerosis without aneurysm. Streaky, coarsened interstitial lung markings at the left lung base would be consistent with patient's known bronchiectasis. The visualized skeletal structures are unremarkable. IMPRESSION: 1. Streaky left basilar lung markings consistent with known bronchiectasis. 2. No pulmonary consolidation or CHF. 3. Mild aortic atherosclerosis. Electronically Signed   By: Ashley Royalty M.D.   On: 12/01/2017 21:11    ____________________________________________   PROCEDURES  Procedure(s) performed:   Procedures   ____________________________________________   INITIAL IMPRESSION / ASSESSMENT AND PLAN / ED COURSE  Discussed with Dr. Justin Mend with nephrology who agrees with admission for hydration,  repeat ultrasound and they will see him in the morning.  We will have a consult hospitalist.  Pertinent labs & imaging results that were available during my care of the patient were reviewed by me and considered in my medical decision making (see chart for details).  ____________________________________________  FINAL CLINICAL IMPRESSION(S) / ED DIAGNOSES  Final diagnoses:  Hyperkalemia  AKI (acute kidney injury) ([place])    MEDICATIONS GIVEN DURING THIS VISIT:  Medications - No data to display   NEW OUTPATIENT MEDICATIONS STARTED DURING THIS VISIT:  New Prescriptions   No medications on file    Note:  This note was prepared with assistance of Dragon voice recognition software. Occasional wrong-word or sound-a-like substitutions may have occurred due to the inherent limitations of voice recognition software.   Merrily Pew, MD 12/01/17 2222

## 2017-12-01 NOTE — ED Triage Notes (Signed)
Pt from [place] with EMS for low creatinine levels. Unknown what the level was, no results sent with the pt. Pt alert upon arrival VSS  BP 152/86 HR 68 rr14 98% room air CBG 209

## 2017-12-01 NOTE — ED Notes (Signed)
Pt caseworker from assisted living helps with interpreting. Pt speaks [place], a dialect of vi[place]. Casworkers numbers: SUPJ [phone], Lamonte Sakai [phone]

## 2017-12-01 NOTE — ED Notes (Signed)
Post void bladder scanned 12 mls urine

## 2017-12-01 NOTE — Telephone Encounter (Signed)
-----   Message from [name], PA-C sent at 12/01/2017 12:34 PM EDT ----- Pt's serum creatinine is elevated and I recommend he go to the emergency room for further work up and stabilization. I have just made a referral to nephrology.

## 2017-12-02 ENCOUNTER — Other Ambulatory Visit: Payer: Self-pay

## 2017-12-02 ENCOUNTER — Inpatient Hospital Stay (HOSPITAL_COMMUNITY): Payer: Medicare Other

## 2017-12-02 DIAGNOSIS — D72829 Elevated white blood cell count, unspecified: Secondary | ICD-10-CM | POA: Diagnosis present

## 2017-12-02 DIAGNOSIS — N179 Acute kidney failure, unspecified: Principal | ICD-10-CM

## 2017-12-02 DIAGNOSIS — D638 Anemia in other chronic diseases classified elsewhere: Secondary | ICD-10-CM

## 2017-12-02 DIAGNOSIS — N189 Chronic kidney disease, unspecified: Secondary | ICD-10-CM

## 2017-12-02 LAB — GLUCOSE, CAPILLARY
GLUCOSE-CAPILLARY: 67 mg/dL — AB (ref 70–99)
GLUCOSE-CAPILLARY: 135 mg/dL — AB (ref 70–99)
Glucose-Capillary: 60 mg/dL — ABNORMAL LOW (ref 70–99)
Glucose-Capillary: 246 mg/dL — ABNORMAL HIGH (ref 70–99)
Glucose-Capillary: 33 mg/dL — CL (ref 70–99)
Glucose-Capillary: 134 mg/dL — ABNORMAL HIGH (ref 70–99)
Glucose-Capillary: 242 mg/dL — ABNORMAL HIGH (ref 70–99)

## 2017-12-02 LAB — CBC
HEMATOCRIT: 30.4 % — AB (ref 39.0–52.0)
Hemoglobin: 9.4 g/dL — ABNORMAL LOW (ref 13.0–17.0)
MCH: 24.8 pg — AB (ref 26.0–34.0)
MCHC: 30.9 g/dL (ref 30.0–36.0)
MCV: 80.2 fL (ref 78.0–100.0)
Platelets: 304 10*3/uL (ref 150–400)
RBC: 3.79 MIL/uL — AB (ref 4.22–5.81)
RDW: 14.1 % (ref 11.5–15.5)
WBC: 14.5 10*3/uL — AB (ref 4.0–10.5)

## 2017-12-02 LAB — SODIUM, URINE, RANDOM: Sodium, Ur: 25 mmol/L

## 2017-12-02 LAB — BASIC METABOLIC PANEL
ANION GAP: 7 (ref 5–15)
BUN: 42 mg/dL — AB (ref 8–23)
CO2: 18 mmol/L — ABNORMAL LOW (ref 22–32)
Calcium: 8.6 mg/dL — ABNORMAL LOW (ref 8.9–10.3)
Chloride: 118 mmol/L — ABNORMAL HIGH (ref 98–111)
Creatinine, Ser: 2.86 mg/dL — ABNORMAL HIGH (ref 0.61–1.24)
GFR calc Af Amer: 23 mL/min — ABNORMAL LOW (ref >60)
GFR calc non Af Amer: 19 mL/min — ABNORMAL LOW (ref >60)
GLUCOSE: 55 mg/dL — AB (ref 70–99)
POTASSIUM: 4.4 mmol/L (ref 3.5–5.1)
Sodium: 143 mmol/L (ref 135–145)

## 2017-12-02 LAB — CREATININE, URINE, RANDOM: CREATININE, URINE: 169.59 mg/dL

## 2017-12-02 LAB — PROTEIN, URINE, RANDOM: Total Protein, Urine: 177 mg/dL

## 2017-12-02 LAB — MRSA PCR SCREENING: MRSA by PCR: NEGATIVE

## 2017-12-02 MED ORDER — INSULIN ASPART 100 UNIT/ML ~~LOC~~ SOLN
0.0000 [IU] | Freq: Three times a day (TID) | SUBCUTANEOUS | Status: DC
  Administered 2017-12-02: 3 [IU] via SUBCUTANEOUS

## 2017-12-02 MED ORDER — VITAMIN B-1 100 MG PO TABS
100.0000 mg | ORAL_TABLET | Freq: Every day | ORAL | Status: DC
  Administered 2017-12-02 – 2017-12-06 (×5): 100 mg via ORAL
  Filled 2017-12-02 (×5): qty 1

## 2017-12-02 MED ORDER — DEXTROSE 50 % IV SOLN
25.0000 mL | Freq: Once | INTRAVENOUS | Status: AC
  Administered 2017-12-02: 50 mL via INTRAVENOUS

## 2017-12-02 MED ORDER — ATORVASTATIN CALCIUM 20 MG PO TABS
20.0000 mg | ORAL_TABLET | Freq: Every day | ORAL | Status: DC
  Administered 2017-12-02 – 2017-12-06 (×5): 20 mg via ORAL
  Filled 2017-12-02 (×5): qty 1

## 2017-12-02 MED ORDER — SODIUM BICARBONATE 650 MG PO TABS
650.0000 mg | ORAL_TABLET | Freq: Two times a day (BID) | ORAL | Status: DC
  Administered 2017-12-02 – 2017-12-06 (×9): 650 mg via ORAL
  Filled 2017-12-02 (×9): qty 1

## 2017-12-02 MED ORDER — AMOXICILLIN-POT CLAVULANATE 875-125 MG PO TABS: 1.0000 | ORAL_TABLET | Freq: Two times a day (BID) | ORAL | Status: DC

## 2017-12-02 MED ORDER — SODIUM CHLORIDE 0.9 % IV SOLN
INTRAVENOUS | Status: DC
  Administered 2017-12-02 – 2017-12-04 (×5): via INTRAVENOUS

## 2017-12-02 MED ORDER — ASPIRIN 81 MG PO CHEW
81.0000 mg | CHEWABLE_TABLET | Freq: Every day | ORAL | Status: DC
  Administered 2017-12-02 – 2017-12-06 (×5): 81 mg via ORAL
  Filled 2017-12-02 (×5): qty 1

## 2017-12-02 MED ORDER — AMOXICILLIN-POT CLAVULANATE 500-125 MG PO TABS
1.0000 | ORAL_TABLET | Freq: Two times a day (BID) | ORAL | Status: DC
  Administered 2017-12-02 – 2017-12-04 (×5): 500 mg via ORAL
  Filled 2017-12-02 (×5): qty 1

## 2017-12-02 MED ORDER — DEXTROSE 50 % IV SOLN
INTRAVENOUS | Status: AC
  Administered 2017-12-02: 50 mL via INTRAVENOUS
  Filled 2017-12-02: qty 50

## 2017-12-02 MED ORDER — DEXTROSE-NACL 5-0.45 % IV SOLN
INTRAVENOUS | Status: DC
  Administered 2017-12-02: 09:00:00 via INTRAVENOUS

## 2017-12-02 MED ORDER — PNEUMOCOCCAL VAC POLYVALENT 25 MCG/0.5ML IJ INJ
0.5000 mL | INJECTION | INTRAMUSCULAR | Status: DC
  Filled 2017-12-02: qty 0.5

## 2017-12-02 MED ORDER — GLUCOSE 40 % PO GEL
ORAL | Status: AC
  Administered 2017-12-02: 37.5 g
  Filled 2017-12-02: qty 1

## 2017-12-02 NOTE — Clinical Social Work Note (Signed)
Patient is a resident from [place]. CSW attempted calling patient's niece. No answer/voicemail.  Dayton Scrape, [place]

## 2017-12-02 NOTE — Progress Notes (Signed)
PROGRESS NOTE    Timothy [name]  BMW:[id number] DOB: [birth year] DOA: 12/01/2017 PCP: [name], PA-C    Brief Narrative: Timothy [name] is a 77 y.o. male with medical history significant of HTN, CKD, anemia, diabetes mellitus type 2, and dementia; who presents after being found to have abnormal lab work.  History is obtained utilizing patient's niece as Optometrist services unavailable for his dialect of [place], but patient is a poor historian due to history of dementia.  At this time patient only complains of right eye pain.  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 Posey Pronto.  Prior to that appears back in March patient had been admitted for acute renal failure that seemed to improve with IV fluids.   ED Course: Upon admission to the emergency department patient was seen to be afebrile with blood pressures elevated up to 167/62, and all other vital signs maintained.  Labs revealed WBC 12.2, hemoglobin 9.8     Assessment & Plan:   Principal Problem:   Acute renal failure superimposed on chronic kidney disease (HCC) Active Problems:   Anemia of chronic disease   DM (diabetes mellitus), type 2 with renal complications (HCC)   Hyperkalemia   Leukocytosis  1-Acute on chronic renal failure stage III Cr baseline 1.8.  Presents with cr at 3.3 Continue with IV fluids.  Strict I and O.  Renal US; medical renal diseases, no hydronephrosis.  Improved, continue with fluids.   2-Leukocytosis. Report cough. Start antibiotics to cover for bronchitis.   3-Right eye pain, right eye status post vitrectomy with placement of silicone oil in right eye on 09/29/2017 by Dr. Posey Pronto Discussed with Dr Posey Pronto, patient has almost no vision on right eye. He would have pain. Plan to see patient out patient.   4-hyperkalemia; resolved.   Metabolic acidosis.  Start sodium bicarb.   HTN;  Continue with coreg, amlodipine, hydralazine.   DM; hypoglycemia. Hold amaryl.   Anemia of chronic diseases.      DVT prophylaxis: Heparin  Code Status: Full code.  Family Communication: his  Disposition Plan:    Consultants:   Dr. Posey Pronto.      Procedures:   Renal US;     Antimicrobials: Amoxicillin.    Subjective: He is alert, he denies diarrhea, report mild cough.  He complaints of right eye pain, he is not able to say for  how long or if it getting worse. Is the same.  He report decreased vision on right side, specially with tears.  Use his CM for translation.   Objective: Vitals:   12/02/17 0115 12/02/17 0133 12/02/17 0400 12/02/17 1230  BP: (!) 144/50 (!) 161/63 (!) 149/66 (!) 151/47  Pulse: 76 79 80 (!) 59  Resp: 16 18 18 18   Temp:  97.9 F (36.6 C) 98 F (36.7 C) (!) 97.4 F (36.3 C)  TempSrc:  Oral Oral Oral  SpO2: 100% 100% 100% 100%  Weight:  47.2 kg (104 lb)    Height:  5' (1.524 m)      Intake/Output Summary (Last 24 hours) at 12/02/2017 1401 Last data filed at 12/02/2017 0900 Gross per 24 hour  Intake 296.25 ml  Output 200 ml  Net 96.25 ml   Filed Weights   12/02/17 0133  Weight: 47.2 kg (104 lb)    Examination:  General exam: Appears calm and comfortable  Respiratory system: Clear to auscultation. Respiratory effort normal. Cardiovascular system: S1 & S2 heard, RRR. No JVD, murmurs, rubs, gallops or clicks. No pedal edema. Gastrointestinal system: Abdomen is nondistended, soft and nontender. No organomegaly or masses felt. Normal bowel sounds heard. Central nervous system: Alert Extremities: Symmetric 5 x 5 power. Skin: No rashes, lesions or ulcers    Data Reviewed: I have personally reviewed following labs and imaging studies  CBC: Recent Labs  Lab 11/30/17 1110 12/01/17 1842 12/02/17 0447  WBC 12.5* 12.2* 14.5*  NEUTROABS 9.2*  --   --   HGB 9.8* 9.6* 9.4*  HCT 30.7* 31.2* 30.4*  MCV  77* 81.5 80.2  PLT 325 362 998   Basic Metabolic Panel: Recent Labs  Lab 11/30/17 1110 12/01/17 1842 12/02/17 0447  NA 141 139 143  K 5.5* 5.3* 4.4  CL 110* 109 118*  CO2 19* 22 18*  GLUCOSE 193* 165* 55*  BUN 38* 48* 42*  CREATININE 2.47* 3.37* 2.86*  CALCIUM 9.0 8.7* 8.6*   GFR: Estimated Creatinine Clearance: 14 mL/min (A) (by C-G formula based on SCr of 2.86 mg/dL (H)). Liver Function Tests: Recent Labs  Lab 12/01/17 1842  AST 18  ALT 7  ALKPHOS 78  BILITOT 0.3  PROT 7.5  ALBUMIN 3.6   No results for input(s): LIPASE, AMYLASE in the last 168 hours. No results for input(s): AMMONIA in the last 168 hours. Coagulation Profile: No results for input(s): INR, PROTIME in the last 168 hours. Cardiac Enzymes: No results for input(s): CKTOTAL, CKMB, CKMBINDEX, TROPONINI in the last 168 hours. BNP (last 3 results) No results for input(s): PROBNP in the last 8760 hours. HbA1C: No results for input(s): HGBA1C in the last 72 hours. CBG: Recent Labs  Lab 12/02/17 0837 12/02/17 0903 12/02/17 0933 12/02/17 1227  GLUCAP 60* 67* 135* 246*   Lipid Profile: No results for input(s): CHOL, HDL, LDLCALC, TRIG, CHOLHDL, LDLDIRECT in the last 72 hours. Thyroid Function Tests: No results for input(s): TSH, T4TOTAL, FREET4, T3FREE, THYROIDAB in the last 72 hours. Anemia Panel: No results for input(s): VITAMINB12, FOLATE, FERRITIN, TIBC, IRON, RETICCTPCT in the last 72 hours. Sepsis Labs: No results for input(s): PROCALCITON, LATICACIDVEN in the last 168 hours.  Recent Results (from the past 240 hour(s))  MRSA PCR Screening     Status: None   Collection Time: 12/02/17  1:56 AM  Result Value Ref Range Status   MRSA by PCR NEGATIVE NEGATIVE Final    Comment:        The GeneXpert MRSA Assay (FDA approved for NASAL specimens only), is one component of a comprehensive MRSA colonization surveillance program. It is not intended to diagnose MRSA infection nor to guide  or monitor treatment for MRSA infections. Performed at [facility], [place] [address], [place], [place] [address]          Radiology Studies: Dg Chest 2 View  Result Date: 12/01/2017 CLINICAL DATA:  Former smoker with diabetes and hypertension. Abnormal creatinine. Cough. EXAM: CHEST - 2 VIEW COMPARISON:  CT chest 07/09/2017 FINDINGS: The heart size and mediastinal contours are within normal limits. Mild aortic atherosclerosis without aneurysm. Streaky, coarsened interstitial lung markings at the left lung base would be consistent with patient's known bronchiectasis. The visualized skeletal structures are unremarkable. IMPRESSION: 1. Streaky left basilar lung markings consistent with known bronchiectasis. 2. No pulmonary consolidation or CHF. 3. Mild aortic atherosclerosis. Electronically Signed   By: Ashley Royalty M.D.   On: 12/01/2017 21:11  US Renal  Result Date: 12/02/2017 CLINICAL DATA:  77 year old male with acute renal insufficiency. History of hypertension and diabetes. EXAM: RENAL / URINARY TRACT ULTRASOUND COMPLETE COMPARISON:  Abdominal ultrasound dated 09/18/2017 and CT dated 07/09/2017 FINDINGS: Right Kidney: Length: 12 cm. Slight increased echogenicity. No hydronephrosis or shadowing stone. Left Kidney: Length: 11 cm. Slight increased echogenicity. No hydronephrosis or shadowing stone. Bladder: Appears normal for degree of bladder distention. IMPRESSION: Mildly echogenic kidneys may represent medical renal disease. No hydronephrosis or shadowing stone. Electronically Signed   By: Anner Crete M.D.   On: 12/02/2017 01:44        Scheduled Meds: . amLODipine  10 mg Oral Daily  . amoxicillin-clavulanate  1 tablet Oral BID  . aspirin  81 mg Oral Daily  . atorvastatin  20 mg Oral Daily  . carvedilol  6.25 mg Oral BID WC  . ferrous gluconate  324 mg Oral Q breakfast  . heparin  5,000 Units Subcutaneous Q8H  . hydrALAZINE  10 mg Oral TID  . insulin aspart  0-9 Units  Subcutaneous TID WC  . [START ON 12/03/2017] pneumococcal 23 valent vaccine  0.5 mL Intramuscular Tomorrow-1000  . thiamine  100 mg Oral Daily   Continuous Infusions: . dextrose 5 % and 0.45% NaCl 100 mL/hr at 12/02/17 0845     LOS: 1 day    Time spent: 35 minutes.     Elmarie Shiley, MD [facility] Pager [phone]  If 7PM-7AM, please contact night-coverage [email or web address] Password [email or web address] 12/02/2017, 2:01 PM

## 2017-12-02 NOTE — Progress Notes (Signed)
Awaiting pt family at bedside to complete admission database

## 2017-12-02 NOTE — Progress Notes (Signed)
Timothy [name], pt case manager assisted completing admission database at bedside  Contact information is [phone]

## 2017-12-03 LAB — CBC
HEMATOCRIT: 29.8 % — AB (ref 39.0–52.0)
Hemoglobin: 9.5 g/dL — ABNORMAL LOW (ref 13.0–17.0)
MCH: 25.1 pg — ABNORMAL LOW (ref 26.0–34.0)
MCHC: 31.9 g/dL (ref 30.0–36.0)
MCV: 78.6 fL (ref 78.0–100.0)
Platelets: 288 10*3/uL (ref 150–400)
RBC: 3.79 MIL/uL — ABNORMAL LOW (ref 4.22–5.81)
RDW: 14 % (ref 11.5–15.5)
WBC: 10.8 10*3/uL — AB (ref 4.0–10.5)

## 2017-12-03 LAB — GLUCOSE, CAPILLARY
GLUCOSE-CAPILLARY: 132 mg/dL — AB (ref 70–99)
Glucose-Capillary: 70 mg/dL (ref 70–99)
Glucose-Capillary: 129 mg/dL — ABNORMAL HIGH (ref 70–99)
Glucose-Capillary: 118 mg/dL — ABNORMAL HIGH (ref 70–99)

## 2017-12-03 LAB — BASIC METABOLIC PANEL
ANION GAP: 6 (ref 5–15)
BUN: 31 mg/dL — ABNORMAL HIGH (ref 8–23)
CALCIUM: 8.4 mg/dL — AB (ref 8.9–10.3)
CO2: 20 mmol/L — AB (ref 22–32)
Chloride: 116 mmol/L — ABNORMAL HIGH (ref 98–111)
Creatinine, Ser: 2.19 mg/dL — ABNORMAL HIGH (ref 0.61–1.24)
GFR, EST AFRICAN AMERICAN: 31 mL/min — AB (ref >60)
GFR, EST NON AFRICAN AMERICAN: 27 mL/min — AB (ref >60)
Glucose, Bld: 72 mg/dL (ref 70–99)
Potassium: 4.7 mmol/L (ref 3.5–5.1)
Sodium: 142 mmol/L (ref 135–145)

## 2017-12-03 MED ORDER — TAMSULOSIN HCL 0.4 MG PO CAPS
0.4000 mg | ORAL_CAPSULE | Freq: Every day | ORAL | Status: DC
  Administered 2017-12-03 – 2017-12-06 (×4): 0.4 mg via ORAL
  Filled 2017-12-03 (×4): qty 1

## 2017-12-03 NOTE — Plan of Care (Signed)
  Problem: Education: Goal: Knowledge of General Education information will improve Outcome: Not Progressing   Problem: Nutrition: Goal: Adequate nutrition will be maintained Outcome: Progressing   Problem: Skin Integrity: Goal: Risk for impaired skin integrity will decrease Outcome: Progressing

## 2017-12-03 NOTE — Progress Notes (Signed)
Responded to scc to support patient. Prayed for patient and provided presence and spiritual support. Pt appeared to be receptive to visit.  Chaplain available as needed.  Jaclynn Major, [place], [facility], Pager [phone]

## 2017-12-03 NOTE — Clinical Social Work Note (Signed)
Clinical Social Work Assessment  Patient Details  Name: Timothy [name] MRN: [id number] Date of Birth: Jan 10, 1941  Date of referral:  12/03/17               Reason for consult:  Discharge Planning                Permission sought to share information with:  Facility Sport and exercise psychologist, Family Supports Permission granted to share information::     Name::     [facility]  Agency::  [facility]  Relationship::  Niece  Contact Information:  [phone]  Housing/Transportation Living arrangements for the past 2 months:  [place] of Information:  Medical Team, Facility, Other (Comment Required)(Niece) Patient Interpreter Needed:  None Criminal Activity/Legal Involvement Pertinent to Current Situation/Hospitalization:  No - Comment as needed Significant Relationships:  Warehouse manager, Other Family Members(Niece) Lives with:  Facility Resident Do you feel safe going back to the place where you live?  Yes Need for family participation in patient care:  Yes (Comment)  Care giving concerns:  Patient is a resident at [facility].   Social Worker assessment / plan:  Patient only oriented to self. No supports at bedside. CSW called patient's niece, introduced role, and explained that discharge planning would be discussed. Patient's niece confirmed he is from [place] and plan is for him to return when stable for discharge. No further concerns. CSW encouraged patient's niece to contact CSW as needed. CSW will continue to follow patient and his niece for support and facilitate discharge back to ALF once medically stable.  Employment status:  Retired Nurse, adult PT Recommendations:  Not assessed at this time Information / Referral to community resources:  Other (Comment Required)(Plan is to return to ALF.)  Patient/Family's Response to care:  Patient only oriented to self. Patient's niece agreeable to return to ALF. Patient's  niece and case workers supportive and involved in patient's care. Patient's niece appreciated social work intervention.  Patient/Family's Understanding of and Emotional Response to Diagnosis, Current Treatment, and Prognosis:  Patient only oriented to self. Patient's niece has a good understanding of the reason for admission and plan to return to ALF at discharge. Patient's niece appears happy with hospital care.  Emotional Assessment Appearance:  Appears stated age Attitude/Demeanor/Rapport:  Unable to Assess Affect (typically observed):  Unable to Assess Orientation:  Oriented to Self Alcohol / Substance use:  Alcohol Use Psych involvement (Current and /or in the community):  No (Comment)  Discharge Needs  Concerns to be addressed:  Care Coordination Readmission within the last 30 days:  No Current discharge risk:  None Barriers to Discharge:  Continued Medical Work up   Candie Chroman, LCSW 12/03/2017, 11:57 AM

## 2017-12-03 NOTE — Care Management Note (Signed)
Case Management Note  Patient Details  Name: Timothy [name] MRN: [id number] Date of Birth: 1941/03/29  Subjective/Objective:   Acute Renal Failure                Action/Plan: Patient resides in [place]; Judson Roch SW following to transition patient back to the facility when medically stable; CM will continue to follow for progression of care.  Expected Discharge Date:    possibly 12/06/2017              Expected Discharge Plan:  Assisted Living / Rest Home  Discharge planning Services  CM Consult  Status of Service:  In process, will continue to follow  Sherrilyn Rist [phone] 12/03/2017, 3:10 PM

## 2017-12-03 NOTE — Progress Notes (Signed)
PROGRESS NOTE    Timothy [name]  TKW:[id number] DOB: Sep 02, 1940 DOA: 12/01/2017 PCP: [name], PA-C    Brief Narrative: Timothy [name] is a 77 y.o. male with medical history significant of HTN, CKD, anemia, diabetes mellitus type 2, and dementia; who presents after being found to have abnormal lab work.  History is obtained utilizing patient's niece as Optometrist services unavailable for his dialect of [place], but patient is a poor historian due to history of dementia.  At this time patient only complains of right eye pain.  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 Posey Pronto.  Prior to that appears back in March patient had been admitted for acute renal failure that seemed to improve with IV fluids.   ED Course: Upon admission to the emergency department patient was seen to be afebrile with blood pressures elevated up to 167/62, and all other vital signs maintained.  Labs revealed WBC 12.2, hemoglobin 9.8   Assessment & Plan:   Principal Problem:   Acute renal failure superimposed on chronic kidney disease (HCC) Active Problems:   Anemia of chronic disease   DM (diabetes mellitus), type 2 with renal complications (HCC)   Hyperkalemia   Leukocytosis  1-Acute on chronic renal failure stage III Cr baseline 1.8.  Presents with cr at 3.3 Continue with IV fluids.  Strict I and O.  Renal US; medical renal diseases, no hydronephrosis.  Improved, continue with fluids.  Had 500 urine retention today. Had I and o cath. Start Flomax, monitor urine out put, if continue to retain will need foley catheter,  Cr trending down.   2-Leukocytosis. Report cough. Started  antibiotics to cover for bronchitis.  Trending down.   3-Right eye pain, right eye status post vitrectomy with placement of silicone oil in right eye on 09/29/2017 by Dr.  Posey Pronto Discussed with Dr Posey Pronto, patient has almost no vision on right eye. He would have pain. Plan to see patient out patient.   4-Hyperkalemia; resolved.   Metabolic acidosis. Started sodium bicarb. Improved.   HTN;  Continue with coreg, amlodipine, hydralazine.   DM; hypoglycemia. Hold amaryl.  Monitor cbg. Discontinue inslin coverage , will 3 units his CBG decrease from 246 to 30.   Anemia of chronic diseases.  Hb stable.     DVT prophylaxis: Heparin  Code Status: Full code.  Family Communication: his CM Disposition Plan: awaiting improvement of renal function.    Consultants:   Dr. Posey Pronto.      Procedures:   Renal US;     Antimicrobials: Amoxicillin.    Subjective: He is alert, denies pain.   Objective: Vitals:   12/03/17 0001 12/03/17 0340 12/03/17 0859 12/03/17 1202  BP: (!) 152/70 (!) 141/74 (!) 175/69 (!) 140/59  Pulse: 68 66 66 64  Resp: 18 18  18   Temp: 98 F (36.7 C) 98 F (36.7 C)  98.4 F (36.9 C)  TempSrc: Oral Oral  Oral  SpO2: 96% 98%  100%  Weight:  46.4 kg (102 lb 6.4 oz)    Height:        Intake/Output Summary (Last 24 hours) at 12/03/2017 1517 Last data filed at 12/03/2017 1400 Gross per 24 hour  Intake 2499.58 ml  Output 1150 ml  Net 1349.58 ml   Filed Weights   12/02/17 0133 12/03/17 0340  Weight: 47.2 kg (104 lb) 46.4  kg (102 lb 6.4 oz)    Examination:  General exam: NAD Respiratory system: CTA Cardiovascular system: S 1, S 2 RRR Gastrointestinal system: BS present, soft, nt Central nervous system: alert  Extremities: symmetric power.  Skin: No rashes, lesions or ulcers    Data Reviewed: I have personally reviewed following labs and imaging studies  CBC: Recent Labs  Lab 11/30/17 1110 12/01/17 1842 12/02/17 0447 12/03/17 0437  WBC 12.5* 12.2* 14.5* 10.8*  NEUTROABS 9.2*  --   --   --   HGB 9.8* 9.6* 9.4* 9.5*  HCT 30.7* 31.2* 30.4* 29.8*  MCV 77* 81.5 80.2 78.6  PLT 325 362 304 675   Basic  Metabolic Panel: Recent Labs  Lab 11/30/17 1110 12/01/17 1842 12/02/17 0447 12/03/17 0437  NA 141 139 143 142  K 5.5* 5.3* 4.4 4.7  CL 110* 109 118* 116*  CO2 19* 22 18* 20*  GLUCOSE 193* 165* 55* 72  BUN 38* 48* 42* 31*  CREATININE 2.47* 3.37* 2.86* 2.19*  CALCIUM 9.0 8.7* 8.6* 8.4*   GFR: Estimated Creatinine Clearance: 18 mL/min (A) (by C-G formula based on SCr of 2.19 mg/dL (H)). Liver Function Tests: Recent Labs  Lab 12/01/17 1842  AST 18  ALT 7  ALKPHOS 78  BILITOT 0.3  PROT 7.5  ALBUMIN 3.6   No results for input(s): LIPASE, AMYLASE in the last 168 hours. No results for input(s): AMMONIA in the last 168 hours. Coagulation Profile: No results for input(s): INR, PROTIME in the last 168 hours. Cardiac Enzymes: No results for input(s): CKTOTAL, CKMB, CKMBINDEX, TROPONINI in the last 168 hours. BNP (last 3 results) No results for input(s): PROBNP in the last 8760 hours. HbA1C: No results for input(s): HGBA1C in the last 72 hours. CBG: Recent Labs  Lab 12/02/17 1641 12/02/17 1734 12/02/17 2134 12/03/17 0738 12/03/17 1134  GLUCAP 33* 134* 242* 70 129*   Lipid Profile: No results for input(s): CHOL, HDL, LDLCALC, TRIG, CHOLHDL, LDLDIRECT in the last 72 hours. Thyroid Function Tests: No results for input(s): TSH, T4TOTAL, FREET4, T3FREE, THYROIDAB in the last 72 hours. Anemia Panel: No results for input(s): VITAMINB12, FOLATE, FERRITIN, TIBC, IRON, RETICCTPCT in the last 72 hours. Sepsis Labs: No results for input(s): PROCALCITON, LATICACIDVEN in the last 168 hours.  Recent Results (from the past 240 hour(s))  MRSA PCR Screening     Status: None   Collection Time: 12/02/17  1:56 AM  Result Value Ref Range Status   MRSA by PCR NEGATIVE NEGATIVE Final    Comment:        The GeneXpert MRSA Assay (FDA approved for NASAL specimens only), is one component of a comprehensive MRSA colonization surveillance program. It is not intended to diagnose  MRSA infection nor to guide or monitor treatment for MRSA infections. Performed at [facility], [place] [address] [place], [place] [address]          Radiology Studies: Dg Chest 2 View  Result Date: 12/01/2017 CLINICAL DATA:  Former smoker with diabetes and hypertension. Abnormal creatinine. Cough. EXAM: CHEST - 2 VIEW COMPARISON:  CT chest 07/09/2017 FINDINGS: The heart size and mediastinal contours are within normal limits. Mild aortic atherosclerosis without aneurysm. Streaky, coarsened interstitial lung markings at the left lung base would be consistent with patient's known bronchiectasis. The visualized skeletal structures are unremarkable. IMPRESSION: 1. Streaky left basilar lung markings consistent with known bronchiectasis. 2. No pulmonary consolidation or CHF. 3. Mild aortic atherosclerosis. Electronically Signed   By: Shanon Brow  Randel Pigg M.D.   On: 12/01/2017 21:11   US Renal  Result Date: 12/02/2017 CLINICAL DATA:  77 year old male with acute renal insufficiency. History of hypertension and diabetes. EXAM: RENAL / URINARY TRACT ULTRASOUND COMPLETE COMPARISON:  Abdominal ultrasound dated 09/18/2017 and CT dated 07/09/2017 FINDINGS: Right Kidney: Length: 12 cm. Slight increased echogenicity. No hydronephrosis or shadowing stone. Left Kidney: Length: 11 cm. Slight increased echogenicity. No hydronephrosis or shadowing stone. Bladder: Appears normal for degree of bladder distention. IMPRESSION: Mildly echogenic kidneys may represent medical renal disease. No hydronephrosis or shadowing stone. Electronically Signed   By: Anner Crete M.D.   On: 12/02/2017 01:44        Scheduled Meds: . amLODipine  10 mg Oral Daily  . amoxicillin-clavulanate  1 tablet Oral BID  . aspirin  81 mg Oral Daily  . atorvastatin  20 mg Oral Daily  . carvedilol  6.25 mg Oral BID WC  . ferrous gluconate  324 mg Oral Q breakfast  . heparin  5,000 Units Subcutaneous Q8H  . hydrALAZINE  10 mg Oral TID  .  pneumococcal 23 valent vaccine  0.5 mL Intramuscular Tomorrow-1000  . sodium bicarbonate  650 mg Oral BID  . tamsulosin  0.4 mg Oral Daily  . thiamine  100 mg Oral Daily   Continuous Infusions: . sodium chloride 100 mL/hr at 12/03/17 1355     LOS: 2 days    Time spent: 35 minutes.     Elmarie Shiley, MD [facility] Pager [phone]  If 7PM-7AM, please contact night-coverage [email or web address] Password [facility] 12/03/2017, 3:17 PM

## 2017-12-03 NOTE — Progress Notes (Signed)
Inpatient Diabetes Program Recommendations  AACE/ADA: New Consensus Statement on Inpatient Glycemic Control (2015)  Target Ranges:  Prepandial:   less than 140 mg/dL      Peak postprandial:   less than 180 mg/dL (1-2 hours)      Critically ill patients:  140 - 180 mg/dL    Review of Glycemic Control  Diabetes history: DM 2 Outpatient Diabetes medications: Amaryl 4 mg Daily Current orders for Inpatient glycemic control: None  Inpatient Diabetes Program Recommendations:    Low glucose on sensitive correction due to elevated renal function, however, glucose trends ted to spike into the 200 level. Consider a custom Novolog Correction scale starting at 150 mg/dl and schedule Q6 hours due to elevated renal function.  -Custom Novolog correction scale 0-5 units       151-200  1 unit      201-250  2 units      251-300  3 units      301-350  4 units      351-400  5 units  Thanks,  Tama Headings RN, MSN, BC-ADM, [facility] Inpatient Diabetes Coordinator Team Pager [phone] (8a-5p)

## 2017-12-04 ENCOUNTER — Inpatient Hospital Stay (HOSPITAL_COMMUNITY): Payer: Medicare Other

## 2017-12-04 LAB — BASIC METABOLIC PANEL
ANION GAP: 6 (ref 5–15)
BUN: 19 mg/dL (ref 8–23)
CHLORIDE: 116 mmol/L — AB (ref 98–111)
CO2: 22 mmol/L (ref 22–32)
Calcium: 8.4 mg/dL — ABNORMAL LOW (ref 8.9–10.3)
Creatinine, Ser: 1.89 mg/dL — ABNORMAL HIGH (ref 0.61–1.24)
GFR calc non Af Amer: 32 mL/min — ABNORMAL LOW (ref >60)
GFR, EST AFRICAN AMERICAN: 37 mL/min — AB (ref >60)
GLUCOSE: 82 mg/dL (ref 70–99)
POTASSIUM: 4.3 mmol/L (ref 3.5–5.1)
SODIUM: 144 mmol/L (ref 135–145)

## 2017-12-04 LAB — CBC
HEMATOCRIT: 31.4 % — AB (ref 39.0–52.0)
HEMOGLOBIN: 9.9 g/dL — AB (ref 13.0–17.0)
MCH: 24.9 pg — ABNORMAL LOW (ref 26.0–34.0)
MCHC: 31.5 g/dL (ref 30.0–36.0)
MCV: 78.9 fL (ref 78.0–100.0)
Platelets: 277 10*3/uL (ref 150–400)
RBC: 3.98 MIL/uL — AB (ref 4.22–5.81)
RDW: 13.8 % (ref 11.5–15.5)
WBC: 10.9 10*3/uL — ABNORMAL HIGH (ref 4.0–10.5)

## 2017-12-04 LAB — GLUCOSE, CAPILLARY
GLUCOSE-CAPILLARY: 109 mg/dL — AB (ref 70–99)
Glucose-Capillary: 74 mg/dL (ref 70–99)
Glucose-Capillary: 110 mg/dL — ABNORMAL HIGH (ref 70–99)

## 2017-12-04 LAB — STREP PNEUMONIAE URINARY ANTIGEN: Strep Pneumo Urinary Antigen: NEGATIVE

## 2017-12-04 MED ORDER — CEFEPIME HCL 1 G IJ SOLR
1.0000 g | INTRAMUSCULAR | Status: DC
  Administered 2017-12-04 – 2017-12-05 (×2): 1 g via INTRAVENOUS
  Filled 2017-12-04 (×3): qty 1

## 2017-12-04 MED ORDER — POLYETHYLENE GLYCOL 3350 17 G PO PACK
17.0000 g | PACK | Freq: Two times a day (BID) | ORAL | Status: DC
  Administered 2017-12-04 (×2): 17 g via ORAL
  Filled 2017-12-04 (×2): qty 1

## 2017-12-04 MED ORDER — SENNOSIDES-DOCUSATE SODIUM 8.6-50 MG PO TABS
1.0000 | ORAL_TABLET | Freq: Two times a day (BID) | ORAL | Status: DC
  Administered 2017-12-04 (×2): 1 via ORAL
  Filled 2017-12-04 (×2): qty 1

## 2017-12-04 NOTE — Progress Notes (Signed)
PROGRESS NOTE    Timothy [name]  KZL:[id number] DOB: 1941-02-10 DOA: 12/01/2017 PCP: [name], PA-C    Brief Narrative: Timothy [name] is a 77 y.o. male with medical history significant of HTN, CKD, anemia, diabetes mellitus type 2, and dementia; who presents after being found to have abnormal lab work.  History is obtained utilizing patient's niece as Optometrist services unavailable for his dialect of [place], but patient is a poor historian due to history of dementia.  At this time patient only complains of right eye pain.  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 Posey Pronto.  Prior to that appears back in March patient had been admitted for acute renal failure that seemed to improve with IV fluids.   ED Course: Upon admission to the emergency department patient was seen to be afebrile with blood pressures elevated up to 167/62, and all other vital signs maintained.  Labs revealed WBC 12.2, hemoglobin 9.8   Assessment & Plan:   Principal Problem:   Acute renal failure superimposed on chronic kidney disease (HCC) Active Problems:   Anemia of chronic disease   DM (diabetes mellitus), type 2 with renal complications (HCC)   Hyperkalemia   Leukocytosis  1-Acute on chronic renal failure stage III Cr baseline 1.8.  Presents with cr at 3.3 Continue with IV fluids.  Strict I and O.  Renal US; medical renal diseases, no hydronephrosis.  Improved, continue with fluids.  Had urine retention 7/18 had I and O cath. Started on flomax. Per nurse patient has been able to urinate.  Cr trending down.   2-PNA;  Leukocytosis. Report cough. Started  antibiotics to cover for bronchitis.  Trending down.  He is complaining of SOB today.  Chest x ray showed PNA and left side pleural effusion.  Will need follow up x ray.  Stop IV fluids.  Blood  culture/.   3-Right eye pain, right eye status post vitrectomy with placement of silicone oil in right eye on 09/29/2017 by Dr. Posey Pronto Discussed with Dr Posey Pronto, patient has almost no vision on right eye. He would have pain. Plan to see patient out patient.   4-Hyperkalemia; resolved.   Metabolic acidosis. Started sodium bicarb. Improved.   HTN;  Continue with coreg, amlodipine, hydralazine.   DM; hypoglycemia. Hold amaryl.  Monitor cbg. Discontinue inslin coverage , will 3 units his CBG decrease from 246 to 30.   Anemia of chronic diseases.  Hb stable.   Constipation; KUB negative for obstruction. Start bowel regimen.    DVT prophylaxis: Heparin  Code Status: Full code.  Family Communication: his CM Disposition Plan: awaiting improvement of renal function.    Consultants:   Dr. Posey Pronto.      Procedures:   Renal US;     Antimicrobials: Amoxicillin.    Subjective: He is complaining of dyspnea.  Feels full of water.  No BM   Objective: Vitals:   12/03/17 1954 12/04/17 0451 12/04/17 0747 12/04/17 1128  BP: (!) 158/51 (!) 169/76 (!) 167/69 (!) 168/64  Pulse: 68 82 70 67  Resp: 16 18 16 18   Temp: (!) 97 F (36.1 C) 98 F (36.7 C) 98.4 F (36.9 C) 98 F (36.7 C)  TempSrc:  Oral Oral Oral  SpO2: 100% 98% 97% 100%  Weight:  46.4 kg (102 lb 6.4 oz)    Height:  Intake/Output Summary (Last 24 hours) at 12/04/2017 1246 Last data filed at 12/04/2017 1100 Gross per 24 hour  Intake 3305.54 ml  Output 1800 ml  Net 1505.54 ml   Filed Weights   12/02/17 0133 12/03/17 0340 12/04/17 0451  Weight: 47.2 kg (104 lb) 46.4 kg (102 lb 6.4 oz) 46.4 kg (102 lb 6.4 oz)    Examination:  General exam: NAD Respiratory system: CTA Cardiovascular system: S 1, S 2 RRR Gastrointestinal system: BS present, soft, nt Central nervous system: Alert  Extremities: Symmetric power.  Skin: no rash.     Data Reviewed: I have personally reviewed following labs and imaging  studies  CBC: Recent Labs  Lab 11/30/17 1110 12/01/17 1842 12/02/17 0447 12/03/17 0437 12/04/17 1205  WBC 12.5* 12.2* 14.5* 10.8* 10.9*  NEUTROABS 9.2*  --   --   --   --   HGB 9.8* 9.6* 9.4* 9.5* 9.9*  HCT 30.7* 31.2* 30.4* 29.8* 31.4*  MCV 77* 81.5 80.2 78.6 78.9  PLT 325 362 304 288 097   Basic Metabolic Panel: Recent Labs  Lab 11/30/17 1110 12/01/17 1842 12/02/17 0447 12/03/17 0437 12/04/17 0553  NA 141 139 143 142 144  K 5.5* 5.3* 4.4 4.7 4.3  CL 110* 109 118* 116* 116*  CO2 19* 22 18* 20* 22  GLUCOSE 193* 165* 55* 72 82  BUN 38* 48* 42* 31* 19  CREATININE 2.47* 3.37* 2.86* 2.19* 1.89*  CALCIUM 9.0 8.7* 8.6* 8.4* 8.4*   GFR: Estimated Creatinine Clearance: 20.8 mL/min (A) (by C-G formula based on SCr of 1.89 mg/dL (H)). Liver Function Tests: Recent Labs  Lab 12/01/17 1842  AST 18  ALT 7  ALKPHOS 78  BILITOT 0.3  PROT 7.5  ALBUMIN 3.6   No results for input(s): LIPASE, AMYLASE in the last 168 hours. No results for input(s): AMMONIA in the last 168 hours. Coagulation Profile: No results for input(s): INR, PROTIME in the last 168 hours. Cardiac Enzymes: No results for input(s): CKTOTAL, CKMB, CKMBINDEX, TROPONINI in the last 168 hours. BNP (last 3 results) No results for input(s): PROBNP in the last 8760 hours. HbA1C: No results for input(s): HGBA1C in the last 72 hours. CBG: Recent Labs  Lab 12/03/17 1134 12/03/17 1656 12/03/17 2112 12/04/17 0745 12/04/17 1141  GLUCAP 129* 132* 118* 74 109*   Lipid Profile: No results for input(s): CHOL, HDL, LDLCALC, TRIG, CHOLHDL, LDLDIRECT in the last 72 hours. Thyroid Function Tests: No results for input(s): TSH, T4TOTAL, FREET4, T3FREE, THYROIDAB in the last 72 hours. Anemia Panel: No results for input(s): VITAMINB12, FOLATE, FERRITIN, TIBC, IRON, RETICCTPCT in the last 72 hours. Sepsis Labs: No results for input(s): PROCALCITON, LATICACIDVEN in the last 168 hours.  Recent Results (from the past 240  hour(s))  MRSA PCR Screening     Status: None   Collection Time: 12/02/17  1:56 AM  Result Value Ref Range Status   MRSA by PCR NEGATIVE NEGATIVE Final    Comment:        The GeneXpert MRSA Assay (FDA approved for NASAL specimens only), is one component of a comprehensive MRSA colonization surveillance program. It is not intended to diagnose MRSA infection nor to guide or monitor treatment for MRSA infections. Performed at [facility], [place] [address] [place], [place] [address]          Radiology Studies: Dg Abd 1 View  Result Date: 12/04/2017 CLINICAL DATA:  Abdominal distention EXAM: ABDOMEN - 1 VIEW COMPARISON:  07/09/2017 FINDINGS: The bowel  gas pattern is normal. No radio-opaque calculi or other significant radiographic abnormality are seen. IMPRESSION: Negative. Electronically Signed   By: Rolm Baptise M.D.   On: 12/04/2017 11:07   Dg Chest Port 1 View  Result Date: 12/04/2017 CLINICAL DATA:  Dyspnea. Abdominal distension. Hypertension diabetes. EXAM: PORTABLE CHEST 1 VIEW COMPARISON:  12/01/2017 FINDINGS: Midline trachea. Mild cardiomegaly. Atherosclerosis in the transverse aorta. Left pleural fluid is slightly increased. No right pleural effusion. Remote right rib trauma. No pneumothorax. Clear right lung. Worsened left base aeration. IMPRESSION: Worsened left base aeration. At least part of this is secondary to chronic bronchiectasis, when compared to the prior CT. The worsened aeration could be due to progressive superimposed pneumonia or simply decreased inspiratory effort and progressive atelectasis. Developing pneumonia is favored. Left pleural effusion, increased. Electronically Signed   By: Abigail Miyamoto M.D.   On: 12/04/2017 11:11        Scheduled Meds: . amLODipine  10 mg Oral Daily  . aspirin  81 mg Oral Daily  . atorvastatin  20 mg Oral Daily  . carvedilol  6.25 mg Oral BID WC  . ferrous gluconate  324 mg Oral Q breakfast  . heparin  5,000 Units  Subcutaneous Q8H  . hydrALAZINE  10 mg Oral TID  . pneumococcal 23 valent vaccine  0.5 mL Intramuscular Tomorrow-1000  . polyethylene glycol  17 g Oral BID  . senna-docusate  1 tablet Oral BID  . sodium bicarbonate  650 mg Oral BID  . tamsulosin  0.4 mg Oral Daily  . thiamine  100 mg Oral Daily   Continuous Infusions: . ceFEPime (MAXIPIME) IV       LOS: 3 days    Time spent: 35 minutes.     Elmarie Shiley, MD [facility] Pager [phone]  If 7PM-7AM, please contact night-coverage [email or web address] Password [facility] 12/04/2017, 12:46 PM

## 2017-12-05 LAB — BASIC METABOLIC PANEL
Anion gap: 6 (ref 5–15)
BUN: 16 mg/dL (ref 8–23)
CALCIUM: 8.7 mg/dL — AB (ref 8.9–10.3)
CO2: 23 mmol/L (ref 22–32)
Chloride: 113 mmol/L — ABNORMAL HIGH (ref 98–111)
Creatinine, Ser: 1.94 mg/dL — ABNORMAL HIGH (ref 0.61–1.24)
GFR calc Af Amer: 36 mL/min — ABNORMAL LOW (ref >60)
GFR, EST NON AFRICAN AMERICAN: 31 mL/min — AB (ref >60)
GLUCOSE: 82 mg/dL (ref 70–99)
Potassium: 4.2 mmol/L (ref 3.5–5.1)
SODIUM: 142 mmol/L (ref 135–145)

## 2017-12-05 LAB — CBC
HCT: 29 % — ABNORMAL LOW (ref 39.0–52.0)
Hemoglobin: 9.1 g/dL — ABNORMAL LOW (ref 13.0–17.0)
MCH: 24.5 pg — AB (ref 26.0–34.0)
MCHC: 31.4 g/dL (ref 30.0–36.0)
MCV: 78.2 fL (ref 78.0–100.0)
PLATELETS: 260 10*3/uL (ref 150–400)
RBC: 3.71 MIL/uL — ABNORMAL LOW (ref 4.22–5.81)
RDW: 13.6 % (ref 11.5–15.5)
WBC: 10.2 10*3/uL (ref 4.0–10.5)

## 2017-12-05 LAB — GLUCOSE, CAPILLARY
GLUCOSE-CAPILLARY: 78 mg/dL (ref 70–99)
GLUCOSE-CAPILLARY: 109 mg/dL — AB (ref 70–99)
Glucose-Capillary: 119 mg/dL — ABNORMAL HIGH (ref 70–99)
Glucose-Capillary: 158 mg/dL — ABNORMAL HIGH (ref 70–99)

## 2017-12-05 NOTE — Progress Notes (Signed)
PROGRESS NOTE    Timothy [name]  BHA:[id number] DOB: 02-Jun-1940 DOA: 12/01/2017 PCP: [name], PA-C    Brief Narrative: Timothy [name] is a 77 y.o. male with medical history significant of HTN, CKD, anemia, diabetes mellitus type 2, and dementia; who presents after being found to have abnormal lab work.  History is obtained utilizing patient's niece as Optometrist services unavailable for his dialect of [place], but patient is a poor historian due to history of dementia.  At this time patient only complains of right eye pain.  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 Posey Pronto.  Prior to that appears back in March patient had been admitted for acute renal failure that seemed to improve with IV fluids.   ED Course: Upon admission to the emergency department patient was seen to be afebrile with blood pressures elevated up to 167/62, and all other vital signs maintained.  Labs revealed WBC 12.2, hemoglobin 9.8   Assessment & Plan:   Principal Problem:   Acute renal failure superimposed on chronic kidney disease (HCC) Active Problems:   Anemia of chronic disease   DM (diabetes mellitus), type 2 with renal complications (HCC)   Hyperkalemia   Leukocytosis  1-Acute on chronic renal failure stage III Cr baseline 1.8.  Presents with cr at 3.3 Renal US; medical renal diseases, no hydronephrosis.  Improved, continue with fluids.  Had urine retention 7/18 had I and O cath. Started on flomax. Per nurse patient has been able to urinate.  Cr trending down.   2-PNA;  Chest x ray showed PNA and left side pleural effusion.  Stop IV fluids.  Blood culture/. No growth to date. Strep pneumonia negative  He is breathing better today.  Day 2 cefepime.   3-Right eye pain, right eye status post vitrectomy with placement of silicone oil in right eye  on 09/29/2017 by Dr. Posey Pronto Discussed with Dr Posey Pronto, patient has almost no vision on right eye. He would have pain. Plan to see patient out patient.   4-Hyperkalemia; resolved.   Metabolic acidosis. Started sodium bicarb. Improved.   HTN;  Continue with coreg, amlodipine, hydralazine.   DM; hypoglycemia. Hold amaryl.  Monitor cbg. Discontinue inslin coverage , will 3 units his CBG decrease from 246 to 30.   Anemia of chronic diseases.  Hb stable.   Constipation; KUB negative for obstruction. Had multiples BM. Stop laxative   DVT prophylaxis: Heparin  Code Status: Full code.  Family Communication: his CM Disposition Plan: awaiting improvement of renal function.    Consultants:   Dr. Posey Pronto.      Procedures:   Renal US;     Antimicrobials: Amoxicillin.    Subjective: He is breathing better today.  Had multiples BM    Objective: Vitals:   12/05/17 0021 12/05/17 0600 12/05/17 0942 12/05/17 1150  BP: (!) 172/81  (!) 173/67 (!) 161/60  Pulse: 87  70 66  Resp: 16  18 20   Temp: 98.1 F (36.7 C)  97.8 F (36.6 C) 97.7 F (36.5 C)  TempSrc: Oral  Oral Oral  SpO2: 100%  99% 99%  Weight:  49 kg (108 lb 1.6 oz)    Height:        Intake/Output Summary (Last 24 hours) at 12/05/2017 1400 Last data filed at 12/05/2017 0538 Gross per 24 hour  Intake 821.06 ml  Output  1150 ml  Net -328.94 ml   Filed Weights   12/03/17 0340 12/04/17 0451 12/05/17 0600  Weight: 46.4 kg (102 lb 6.4 oz) 46.4 kg (102 lb 6.4 oz) 49 kg (108 lb 1.6 oz)    Examination:  General exam: NAD Respiratory system: CTA Cardiovascular system: S 1 , S 2 RRR Gastrointestinal system: BS present, soft, nt Central nervous system: Alert.  Extremities: Symmetric power.  Skin: no rash.     Data Reviewed: I have personally reviewed following labs and imaging studies  CBC: Recent Labs  Lab 11/30/17 1110 12/01/17 1842 12/02/17 0447 12/03/17 0437 12/04/17 1205 12/05/17 0607  WBC 12.5*  12.2* 14.5* 10.8* 10.9* 10.2  NEUTROABS 9.2*  --   --   --   --   --   HGB 9.8* 9.6* 9.4* 9.5* 9.9* 9.1*  HCT 30.7* 31.2* 30.4* 29.8* 31.4* 29.0*  MCV 77* 81.5 80.2 78.6 78.9 78.2  PLT 325 362 304 288 277 644   Basic Metabolic Panel: Recent Labs  Lab 12/01/17 1842 12/02/17 0447 12/03/17 0437 12/04/17 0553 12/05/17 0607  NA 139 143 142 144 142  K 5.3* 4.4 4.7 4.3 4.2  CL 109 118* 116* 116* 113*  CO2 22 18* 20* 22 23  GLUCOSE 165* 55* 72 82 82  BUN 48* 42* 31* 19 16  CREATININE 3.37* 2.86* 2.19* 1.89* 1.94*  CALCIUM 8.7* 8.6* 8.4* 8.4* 8.7*   GFR: Estimated Creatinine Clearance: 21.4 mL/min (A) (by C-G formula based on SCr of 1.94 mg/dL (H)). Liver Function Tests: Recent Labs  Lab 12/01/17 1842  AST 18  ALT 7  ALKPHOS 78  BILITOT 0.3  PROT 7.5  ALBUMIN 3.6   No results for input(s): LIPASE, AMYLASE in the last 168 hours. No results for input(s): AMMONIA in the last 168 hours. Coagulation Profile: No results for input(s): INR, PROTIME in the last 168 hours. Cardiac Enzymes: No results for input(s): CKTOTAL, CKMB, CKMBINDEX, TROPONINI in the last 168 hours. BNP (last 3 results) No results for input(s): PROBNP in the last 8760 hours. HbA1C: No results for input(s): HGBA1C in the last 72 hours. CBG: Recent Labs  Lab 12/04/17 0745 12/04/17 1141 12/04/17 1619 12/05/17 0754 12/05/17 1148  GLUCAP 74 109* 110* 78 119*   Lipid Profile: No results for input(s): CHOL, HDL, LDLCALC, TRIG, CHOLHDL, LDLDIRECT in the last 72 hours. Thyroid Function Tests: No results for input(s): TSH, T4TOTAL, FREET4, T3FREE, THYROIDAB in the last 72 hours. Anemia Panel: No results for input(s): VITAMINB12, FOLATE, FERRITIN, TIBC, IRON, RETICCTPCT in the last 72 hours. Sepsis Labs: No results for input(s): PROCALCITON, LATICACIDVEN in the last 168 hours.  Recent Results (from the past 240 hour(s))  MRSA PCR Screening     Status: None   Collection Time: 12/02/17  1:56 AM  Result  Value Ref Range Status   MRSA by PCR NEGATIVE NEGATIVE Final    Comment:        The GeneXpert MRSA Assay (FDA approved for NASAL specimens only), is one component of a comprehensive MRSA colonization surveillance program. It is not intended to diagnose MRSA infection nor to guide or monitor treatment for MRSA infections. Performed at [facility], [place] [address] [place], [place] [address]          Radiology Studies: Dg Abd 1 View  Result Date: 12/04/2017 CLINICAL DATA:  Abdominal distention EXAM: ABDOMEN - 1 VIEW COMPARISON:  07/09/2017 FINDINGS: The bowel gas pattern is normal. No radio-opaque calculi or other significant radiographic  abnormality are seen. IMPRESSION: Negative. Electronically Signed   By: Rolm Baptise M.D.   On: 12/04/2017 11:07   Dg Chest Port 1 View  Result Date: 12/04/2017 CLINICAL DATA:  Dyspnea. Abdominal distension. Hypertension diabetes. EXAM: PORTABLE CHEST 1 VIEW COMPARISON:  12/01/2017 FINDINGS: Midline trachea. Mild cardiomegaly. Atherosclerosis in the transverse aorta. Left pleural fluid is slightly increased. No right pleural effusion. Remote right rib trauma. No pneumothorax. Clear right lung. Worsened left base aeration. IMPRESSION: Worsened left base aeration. At least part of this is secondary to chronic bronchiectasis, when compared to the prior CT. The worsened aeration could be due to progressive superimposed pneumonia or simply decreased inspiratory effort and progressive atelectasis. Developing pneumonia is favored. Left pleural effusion, increased. Electronically Signed   By: Abigail Miyamoto M.D.   On: 12/04/2017 11:11        Scheduled Meds: . amLODipine  10 mg Oral Daily  . aspirin  81 mg Oral Daily  . atorvastatin  20 mg Oral Daily  . carvedilol  6.25 mg Oral BID WC  . ferrous gluconate  324 mg Oral Q breakfast  . heparin  5,000 Units Subcutaneous Q8H  . hydrALAZINE  10 mg Oral TID  . pneumococcal 23 valent vaccine  0.5 mL  Intramuscular Tomorrow-1000  . polyethylene glycol  17 g Oral BID  . senna-docusate  1 tablet Oral BID  . sodium bicarbonate  650 mg Oral BID  . tamsulosin  0.4 mg Oral Daily  . thiamine  100 mg Oral Daily   Continuous Infusions: . ceFEPime (MAXIPIME) IV 1 g (12/05/17 1335)     LOS: 4 days    Time spent: 35 minutes.     Elmarie Shiley, MD [facility] Pager [phone]  If 7PM-7AM, please contact night-coverage [email or web address] Password [email or web address] 12/05/2017, 2:00 PM

## 2017-12-06 ENCOUNTER — Encounter (HOSPITAL_COMMUNITY): Payer: Self-pay

## 2017-12-06 LAB — BASIC METABOLIC PANEL
Anion gap: 8 (ref 5–15)
BUN: 18 mg/dL (ref 8–23)
CHLORIDE: 111 mmol/L (ref 98–111)
CO2: 23 mmol/L (ref 22–32)
CREATININE: 1.91 mg/dL — AB (ref 0.61–1.24)
Calcium: 8.9 mg/dL (ref 8.9–10.3)
GFR calc Af Amer: 37 mL/min — ABNORMAL LOW (ref >60)
GFR calc non Af Amer: 32 mL/min — ABNORMAL LOW (ref >60)
Glucose, Bld: 80 mg/dL (ref 70–99)
Potassium: 4.3 mmol/L (ref 3.5–5.1)
Sodium: 142 mmol/L (ref 135–145)

## 2017-12-06 LAB — GLUCOSE, CAPILLARY
Glucose-Capillary: 79 mg/dL (ref 70–99)
Glucose-Capillary: 154 mg/dL — ABNORMAL HIGH (ref 70–99)

## 2017-12-06 MED ORDER — SODIUM BICARBONATE 650 MG PO TABS: 650.0000 mg | ORAL_TABLET | Freq: Two times a day (BID) | ORAL | 0 refills | Status: DC

## 2017-12-06 MED ORDER — CEFUROXIME AXETIL 500 MG PO TABS: 500.0000 mg | ORAL_TABLET | Freq: Two times a day (BID) | ORAL | 0 refills | Status: AC

## 2017-12-06 MED ORDER — TAMSULOSIN HCL 0.4 MG PO CAPS: 0.4000 mg | ORAL_CAPSULE | Freq: Every day | ORAL | 0 refills | Status: DC

## 2017-12-06 NOTE — Progress Notes (Signed)
Report given to [employer]. IV and tele removed. [facility] at bedside to transport.

## 2017-12-06 NOTE — Progress Notes (Signed)
Clinical Social Worker facilitated patient discharge including contacting patient family and facility to confirm patient discharge plans.  Clinical information faxed to facility and family agreeable with plan. CSW arranged ambulance transport via [facility] to [facility] .  RN to call for report prior to discharge [phone] Room 103  Clinical Social Worker will sign off for now as social work intervention is no longer needed. Please consult [name] again if new need arises.  Leo Rod, MSW [phone]

## 2017-12-06 NOTE — Discharge Summary (Signed)
Physician Discharge Summary  Rommie Dunn CHE:[id number] DOB: 16-Nov-1940 DOA: 12/01/2017  PCP: [name], PA-C  Admit date: 12/01/2017 Discharge date: 12/06/2017  Admitted From: SNF Disposition:  SNF  Recommendations for Outpatient Follow-up:  1. Follow up with PCP in 1-2 weeks 2. Please obtain BMP/CBC in one week 3. Needs to follow up with Dr Posey Pronto, ophthalmology  4. Encourage oral intake.  5. Needs follow up with nephrologist    Discharge Condition: stable.  CODE STATUS: full code.  Diet recommendation: Heart Healthy  Brief/Interim Summary:  Brief Narrative: Zaydenn Nengis a 77 y.o.malewith medical history significant ofHTN, CKD, anemia, diabetes mellitus type 2, and dementia; who presents after being found to have abnormal lab work. History is obtained utilizing patient's niece as Optometrist services unavailable for his dialect of [place], but patient is a poor historian due to history of dementia. At this time patient only complains of right eye pain. 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 Posey Pronto.Prior to that appears back in March patient had been admitted for acute renal failure that seemed to improve with IV fluids.   ED Course:Upon admission to the emergency department patient was seen to be afebrile with blood pressures elevated up to 167/62, and all other vital signs maintained. Labs revealed WBC 12.2, hemoglobin 9.8   Assessment & Plan:   Principal Problem:   Acute renal failure superimposed on chronic kidney disease (HCC) Active Problems:   Anemia of chronic disease   DM (diabetes mellitus), type 2 with renal complications (HCC)   Hyperkalemia   Leukocytosis  1-Acute on chronic renal failure stage III Cr baseline 1.8.  Presents with cr at 3.3 Renal US; medical renal diseases, no  hydronephrosis.  Improved, continue with fluids.  Had urine retention 7/18 had I and O cath. Started on flomax. Per nurse patient has been able to urinate.  Cr trending down.  stable at 1.9.  Needs referral to nephrology. Also encourage oral intake.   2-PNA;  Chest x ray showed PNA and left side pleural effusion.  Blood culture/. No growth to date. Strep pneumonia negative  Received 3 days of cefepime. discharge on ceftin.  WBC normalized. He is feeling better, breathing better.   3-Right eye pain, right eye status post vitrectomy with placement of silicone oil in right eye on 09/29/2017 by Dr. Posey Pronto Discussed with Dr Posey Pronto, patient has almost no vision on right eye. He would have pain. Plan to see patient out patient.   4-Hyperkalemia; resolved.   Metabolic acidosis. Started sodium bicarb. Improved.   HTN;  Continue with coreg, amlodipine, hydralazine. Imdur.   DM; hypoglycemia. Hold amaryl.  Monitor cbg. Discontinue inslin coverage , will 3 units his CBG decrease from 246 to 30.  poor oral intake, discontinue glipizide at discharge.  cbg controlled on diet .   Anemia of chronic diseases.  Hb stable.   Constipation; KUB negative for obstruction. Had multiples BM. Stop laxative    Discharge Diagnoses:  Principal Problem:   Acute renal failure superimposed on chronic kidney disease (HCC) Active Problems:   Anemia of chronic disease   DM (diabetes mellitus), type 2 with renal complications (HCC)   Hyperkalemia   Leukocytosis    Discharge Instructions  Discharge Instructions    Diet - low sodium heart healthy   Complete by:  As directed    Increase activity  slowly   Complete by:  As directed      Allergies as of 12/06/2017   No Known Allergies     Medication List    STOP taking these medications   azithromycin 250 MG tablet Commonly known as:  ZITHROMAX   chlorhexidine 0.12 % solution Commonly known as:  PERIDEX   glimepiride 4 MG  tablet Commonly known as:  AMARYL   ibuprofen 400 MG tablet Commonly known as:  ADVIL,MOTRIN   multivitamin with minerals Tabs tablet   ofloxacin 0.3 % ophthalmic solution Commonly known as:  OCUFLOX   prednisoLONE acetate 1 % ophthalmic suspension Commonly known as:  PRED FORTE   THERA-M PO     TAKE these medications   albuterol 108 (90 Base) MCG/ACT inhaler Commonly known as:  PROVENTIL HFA;VENTOLIN HFA Inhale 2 puffs into the lungs every 6 (six) hours as needed for wheezing or shortness of breath.   amLODipine 10 MG tablet Commonly known as:  NORVASC Take 1 tablet (10 mg total) by mouth daily.   aspirin 81 MG chewable tablet Chew 1 tablet (81 mg total) by mouth daily.   atorvastatin 20 MG tablet Commonly known as:  LIPITOR Take 1 tablet (20 mg total) by mouth daily.   carvedilol 6.25 MG tablet Commonly known as:  COREG Take 1 tablet (6.25 mg total) by mouth 2 (two) times daily with a meal.   cefUROXime 500 MG tablet Commonly known as:  CEFTIN Take 1 tablet (500 mg total) by mouth 2 (two) times daily for 5 days.   ferrous gluconate 324 MG tablet Commonly known as:  FERGON Take 1 tablet (324 mg total) by mouth daily with breakfast.   folic acid 1 MG tablet Commonly known as:  FOLVITE Take 1 tablet (1 mg total) by mouth daily.   hydrALAZINE 10 MG tablet Commonly known as:  APRESOLINE Take 1 tablet (10 mg total) by mouth 3 (three) times daily.   isosorbide mononitrate 30 MG 24 hr tablet Commonly known as:  IMDUR Take 1 tablet (30 mg total) by mouth daily.   ketorolac 0.4 % Soln Commonly known as:  ACULAR Place 1 drop into the right eye 4 (four) times daily.   omega-3 acid ethyl esters 1 g capsule Commonly known as:  LOVAZA Take 2 capsules (2 g total) by mouth 2 (two) times daily.   sodium bicarbonate 650 MG tablet Take 1 tablet (650 mg total) by mouth 2 (two) times daily.   tamsulosin 0.4 MG Caps capsule Commonly known as:  FLOMAX Take 1 capsule  (0.4 mg total) by mouth daily. Start taking on:  12/07/2017   thiamine 100 MG tablet Commonly known as:  VITAMIN B-1 Take 100 mg by mouth daily.       No Known Allergies  Consultations:  none   Procedures/Studies: Dg Chest 2 View  Result Date: 12/01/2017 CLINICAL DATA:  Former smoker with diabetes and hypertension. Abnormal creatinine. Cough. EXAM: CHEST - 2 VIEW COMPARISON:  CT chest 07/09/2017 FINDINGS: The heart size and mediastinal contours are within normal limits. Mild aortic atherosclerosis without aneurysm. Streaky, coarsened interstitial lung markings at the left lung base would be consistent with patient's known bronchiectasis. The visualized skeletal structures are unremarkable. IMPRESSION: 1. Streaky left basilar lung markings consistent with known bronchiectasis. 2. No pulmonary consolidation or CHF. 3. Mild aortic atherosclerosis. Electronically Signed   By: Ashley Royalty M.D.   On: 12/01/2017 21:11   Dg Abd 1 View  Result Date: 12/04/2017 CLINICAL DATA:  Abdominal distention EXAM: ABDOMEN - 1 VIEW COMPARISON:  07/09/2017 FINDINGS: The bowel gas pattern is normal. No radio-opaque calculi or other significant radiographic abnormality are seen. IMPRESSION: Negative. Electronically Signed   By: Rolm Baptise M.D.   On: 12/04/2017 11:07   US Renal  Result Date: 12/02/2017 CLINICAL DATA:  77 year old male with acute renal insufficiency. History of hypertension and diabetes. EXAM: RENAL / URINARY TRACT ULTRASOUND COMPLETE COMPARISON:  Abdominal ultrasound dated 09/18/2017 and CT dated 07/09/2017 FINDINGS: Right Kidney: Length: 12 cm. Slight increased echogenicity. No hydronephrosis or shadowing stone. Left Kidney: Length: 11 cm. Slight increased echogenicity. No hydronephrosis or shadowing stone. Bladder: Appears normal for degree of bladder distention. IMPRESSION: Mildly echogenic kidneys may represent medical renal disease. No hydronephrosis or shadowing stone. Electronically Signed    By: Anner Crete M.D.   On: 12/02/2017 01:44   Dg Chest Port 1 View  Result Date: 12/04/2017 CLINICAL DATA:  Dyspnea. Abdominal distension. Hypertension diabetes. EXAM: PORTABLE CHEST 1 VIEW COMPARISON:  12/01/2017 FINDINGS: Midline trachea. Mild cardiomegaly. Atherosclerosis in the transverse aorta. Left pleural fluid is slightly increased. No right pleural effusion. Remote right rib trauma. No pneumothorax. Clear right lung. Worsened left base aeration. IMPRESSION: Worsened left base aeration. At least part of this is secondary to chronic bronchiectasis, when compared to the prior CT. The worsened aeration could be due to progressive superimposed pneumonia or simply decreased inspiratory effort and progressive atelectasis. Developing pneumonia is favored. Left pleural effusion, increased. Electronically Signed   By: Abigail Miyamoto M.D.   On: 12/04/2017 11:11      Subjective: Feeling better, breathing better.   Discharge Exam: Vitals:   12/05/17 2050 12/06/17 0554  BP: (!) 171/57 (!) 170/61  Pulse: 67 68  Resp: 20 20  Temp: 98.9 F (37.2 C) 97.7 F (36.5 C)  SpO2: 100% 99%   Vitals:   12/05/17 1707 12/05/17 2050 12/06/17 0546 12/06/17 0554  BP: (!) 172/56 (!) 171/57  (!) 170/61  Pulse:  67  68  Resp:  20  20  Temp:  98.9 F (37.2 C)  97.7 F (36.5 C)  TempSrc:  Oral  Oral  SpO2:  100%  99%  Weight:   47.9 kg (105 lb 9.6 oz)   Height:        General: Pt is alert, awake, not in acute distress Cardiovascular: RRR, S1/S2 +, no rubs, no gallops Respiratory: CTA bilaterally, no wheezing, no rhonchi Abdominal: Soft, NT, ND, bowel sounds + Extremities: no edema, no cyanosis    The results of significant diagnostics from this hospitalization (including imaging, microbiology, ancillary and laboratory) are listed below for reference.     Microbiology: Recent Results (from the past 240 hour(s))  MRSA PCR Screening     Status: None   Collection Time: 12/02/17  1:56 AM   Result Value Ref Range Status   MRSA by PCR NEGATIVE NEGATIVE Final    Comment:        The GeneXpert MRSA Assay (FDA approved for NASAL specimens only), is one component of a comprehensive MRSA colonization surveillance program. It is not intended to diagnose MRSA infection nor to guide or monitor treatment for MRSA infections. Performed at [facility], [place] [address] [place], [place] [address]   Culture, blood (routine x 2) Call MD if unable to obtain prior to antibiotics being given     Status: None (Preliminary result)   Collection Time: 12/04/17 12:01 PM  Result Value Ref Range Status   Specimen  Description BLOOD LEFT HAND  Final   Special Requests   Final    BOTTLES DRAWN AEROBIC AND ANAEROBIC Blood Culture adequate volume   Culture   Final    NO GROWTH 1 DAY Performed at [facility], [address], [place], [place] [address]    Report Status PENDING  Incomplete  Culture, blood (routine x 2) Call MD if unable to obtain prior to antibiotics being given     Status: None (Preliminary result)   Collection Time: 12/04/17 12:10 PM  Result Value Ref Range Status   Specimen Description BLOOD RIGHT ARM  Final   Special Requests   Final    BOTTLES DRAWN AEROBIC AND ANAEROBIC Blood Culture adequate volume   Culture   Final    NO GROWTH 1 DAY Performed at [facility], [place] [address], [place], [place] [address]    Report Status PENDING  Incomplete     Labs: BNP (last 3 results) No results for input(s): BNP in the last 8760 hours. Basic Metabolic Panel: Recent Labs  Lab 12/02/17 0447 12/03/17 0437 12/04/17 0553 12/05/17 0607 12/06/17 0621  NA 143 142 144 142 142  K 4.4 4.7 4.3 4.2 4.3  CL 118* 116* 116* 113* 111  CO2 18* 20* 22 23 23   GLUCOSE 55* 72 82 82 80  BUN 42* 31* 19 16 18   CREATININE 2.86* 2.19* 1.89* 1.94* 1.91*  CALCIUM 8.6* 8.4* 8.4* 8.7* 8.9   Liver Function Tests: Recent Labs  Lab 12/01/17 1842  AST 18  ALT 7  ALKPHOS 78   BILITOT 0.3  PROT 7.5  ALBUMIN 3.6   No results for input(s): LIPASE, AMYLASE in the last 168 hours. No results for input(s): AMMONIA in the last 168 hours. CBC: Recent Labs  Lab 11/30/17 1110 12/01/17 1842 12/02/17 0447 12/03/17 0437 12/04/17 1205 12/05/17 0607  WBC 12.5* 12.2* 14.5* 10.8* 10.9* 10.2  NEUTROABS 9.2*  --   --   --   --   --   HGB 9.8* 9.6* 9.4* 9.5* 9.9* 9.1*  HCT 30.7* 31.2* 30.4* 29.8* 31.4* 29.0*  MCV 77* 81.5 80.2 78.6 78.9 78.2  PLT 325 362 304 288 277 260   Cardiac Enzymes: No results for input(s): CKTOTAL, CKMB, CKMBINDEX, TROPONINI in the last 168 hours. BNP: Invalid input(s): POCBNP CBG: Recent Labs  Lab 12/05/17 0754 12/05/17 1148 12/05/17 1640 12/05/17 2138 12/06/17 0805  GLUCAP 78 119* 158* 109* 79   D-Dimer No results for input(s): DDIMER in the last 72 hours. Hgb A1c No results for input(s): HGBA1C in the last 72 hours. Lipid Profile No results for input(s): CHOL, HDL, LDLCALC, TRIG, CHOLHDL, LDLDIRECT in the last 72 hours. Thyroid function studies No results for input(s): TSH, T4TOTAL, T3FREE, THYROIDAB in the last 72 hours.  Invalid input(s): FREET3 Anemia work up No results for input(s): VITAMINB12, FOLATE, FERRITIN, TIBC, IRON, RETICCTPCT in the last 72 hours. Urinalysis    Component Value Date/Time   COLORURINE YELLOW 12/01/2017 1850   APPEARANCEUR HAZY (A) 12/01/2017 1850   LABSPEC 1.014 12/01/2017 1850   PHURINE 5.0 12/01/2017 1850   GLUCOSEU 50 (A) 12/01/2017 1850   HGBUR NEGATIVE 12/01/2017 1850   BILIRUBINUR NEGATIVE 12/01/2017 [place] 12/01/2017 1850   PROTEINUR >=300 (A) 12/01/2017 1850   UROBILINOGEN 0.2 11/15/2013 0231   NITRITE NEGATIVE 12/01/2017 1850   LEUKOCYTESUR NEGATIVE 12/01/2017 1850   Sepsis Labs Invalid input(s): PROCALCITONIN,  WBC,  LACTICIDVEN Microbiology Recent Results (from the  past 240 hour(s))  MRSA PCR Screening     Status: None   Collection Time: 12/02/17  1:56 AM   Result Value Ref Range Status   MRSA by PCR NEGATIVE NEGATIVE Final    Comment:        The GeneXpert MRSA Assay (FDA approved for NASAL specimens only), is one component of a comprehensive MRSA colonization surveillance program. It is not intended to diagnose MRSA infection nor to guide or monitor treatment for MRSA infections. Performed at [facility], [place] [address] [place], [place] [address]   Culture, blood (routine x 2) Call MD if unable to obtain prior to antibiotics being given     Status: None (Preliminary result)   Collection Time: 12/04/17 12:01 PM  Result Value Ref Range Status   Specimen Description BLOOD LEFT HAND  Final   Special Requests   Final    BOTTLES DRAWN AEROBIC AND ANAEROBIC Blood Culture adequate volume   Culture   Final    NO GROWTH 1 DAY Performed at [facility], [place] [address] [place], [place] [address]    Report Status PENDING  Incomplete  Culture, blood (routine x 2) Call MD if unable to obtain prior to antibiotics being given     Status: None (Preliminary result)   Collection Time: 12/04/17 12:10 PM  Result Value Ref Range Status   Specimen Description BLOOD RIGHT ARM  Final   Special Requests   Final    BOTTLES DRAWN AEROBIC AND ANAEROBIC Blood Culture adequate volume   Culture   Final    NO GROWTH 1 DAY Performed at [facility], [place] [address], [place],  [address]    Report Status PENDING  Incomplete     Time coordinating discharge: 35 minutes.   SIGNED:   Elmarie Shiley, MD  [facility] 12/06/2017, 10:45 AM Pager   If 7PM-7AM, please contact night-coverage [email or web address] Password [email or web address]

## 2017-12-06 NOTE — NC FL2 (Signed)
[place] MEDICAID FL2 LEVEL OF CARE SCREENING TOOL     IDENTIFICATION  Patient Name: Timothy [name] Birthdate: April 02, 1941 Sex: male Admission Date (Current Location): 12/01/2017  [facility] and [place] Number:  Herbalist and Address:  [facility] [place] [place] [address] [place], [place] [address]      Provider Number: [id number]  Attending Physician Name and Address:  Elmarie Shiley, MD  Relative Name and Phone Number:       Current Level of Care:   Recommended Level of Care: [place] Prior Approval Number:    Date Approved/Denied:   PASRR Number:    Discharge Plan: Other (Comment)(ALF)    Current Diagnoses: Patient Active Problem List   Diagnosis Date Noted  . Leukocytosis 12/02/2017  . Acute renal failure superimposed on chronic kidney disease ([place]) 12/01/2017  . ARF (acute renal failure) ([place]) 12/01/2017  . 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 PNA (pneumonia) 07/09/2017  . HCAP (healthcare-associated pneumonia)   . Hypertension 06/26/2017  . Hypertensive emergency 06/25/2017  . Acute kidney injury superimposed on chronic kidney disease ([place]) 06/25/2017  . Hyperkalemia 06/25/2017  . 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 Left retinal detachment   . 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 Bilateral cataracts 04/08/2017  . DM (diabetes mellitus), type 2 with renal complications ([place])   . Alcohol abuse   . Poor vision   . Chronic kidney disease 02/13/2017  . Anemia of chronic disease 02/13/2017  . 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 Epigastric pain 02/11/2017  . Fall at home 11/16/2013  . Closed TBI (traumatic brain injury) ([place]) 11/16/2013  . SDH (subdural hematoma) ([facility]) 11/15/2013    Orientation RESPIRATION BLADDER Height & Weight     Self  Normal Continent Weight: 105 lb 9.6 oz (47.9 kg) Height:  5' (152.4 cm)  BEHAVIORAL SYMPTOMS/MOOD NEUROLOGICAL BOWEL NUTRITION STATUS      Continent  Diet(Heart Diet Thin Fluids)  AMBULATORY STATUS COMMUNICATION OF NEEDS Skin   Limited Assist Does not communicate Normal                       Personal Care Assistance Level of Assistance  Bathing, Feeding, Dressing Bathing Assistance: Limited assistance Feeding assistance: Limited assistance Dressing Assistance: Limited assistance     Functional Limitations Info  Sight, Hearing, Speech Sight Info: Impaired Hearing Info: Impaired Speech Info: Impaired    SPECIAL CARE FACTORS FREQUENCY                       Contractures Contractures Info: Not present    Additional Factors Info  Code Status(Full Code) Code Status Info: Full Code             Current Medications (12/06/2017):  This is the current hospital active medication list Current Facility-Administered Medications  Medication Dose Route Frequency Provider Last Rate Last Dose  . acetaminophen (TYLENOL) tablet 650 mg  650 mg Oral Q6H PRN Norval Morton, MD       Or  . acetaminophen (TYLENOL) suppository 650 mg  650 mg Rectal Q6H PRN Smith, Rondell A, MD      . albuterol (PROVENTIL) (2.5 MG/3ML) 0.083% nebulizer solution 2.5 mg  2.5 mg Nebulization Q6H PRN Smith, Rondell A, MD      . amLODipine (NORVASC) tablet 10 mg  10 mg Oral Daily Tamala Julian, Rondell A, MD   10 mg at 12/06/17 1006  . aspirin  chewable tablet 81 mg  81 mg Oral Daily Smith, Rondell A, MD   81 mg at 12/06/17 1006  . atorvastatin (LIPITOR) tablet 20 mg  20 mg Oral Daily Smith, Rondell A, MD   20 mg at 12/06/17 1006  . carvedilol (COREG) tablet 6.25 mg  6.25 mg Oral BID WC Smith, Rondell A, MD   6.25 mg at 12/06/17 1006  . ceFEPIme (MAXIPIME) 1 g in sodium chloride 0.9 % 100 mL IVPB  1 g Intravenous Q24H Regalado, Belkys A, MD   Stopped at 12/05/17 1410  . ferrous gluconate (FERGON) tablet 324 mg  324 mg Oral Q breakfast Fuller Plan A, MD   324 mg at 12/06/17 1006  . heparin injection 5,000 Units  5,000 Units Subcutaneous Q8H Fuller Plan A, MD    5,000 Units at 12/06/17 0640  . hydrALAZINE (APRESOLINE) tablet 10 mg  10 mg Oral TID Fuller Plan A, MD   10 mg at 12/06/17 1006  . ondansetron (ZOFRAN) tablet 4 mg  4 mg Oral Q6H PRN Fuller Plan A, MD       Or  . ondansetron (ZOFRAN) injection 4 mg  4 mg Intravenous Q6H PRN Smith, Rondell A, MD      . pneumococcal 23 valent vaccine (PNU-IMMUNE) injection 0.5 mL  0.5 mL Intramuscular Tomorrow-1000 Regalado, Belkys A, MD      . sodium bicarbonate tablet 650 mg  650 mg Oral BID Regalado, Belkys A, MD   650 mg at 12/06/17 1006  . tamsulosin (FLOMAX) capsule 0.4 mg  0.4 mg Oral Daily Regalado, Belkys A, MD   0.4 mg at 12/06/17 1006  . thiamine (VITAMIN B-1) tablet 100 mg  100 mg Oral Daily Fuller Plan A, MD   100 mg at 12/06/17 1006     Discharge Medications: Please see discharge summary for a list of discharge medications.  Relevant Imaging Results:  Relevant Lab Results:   Additional Information SN: [id number]  Glendale Chard, LCSW

## 2017-12-09 ENCOUNTER — Other Ambulatory Visit (INDEPENDENT_AMBULATORY_CARE_PROVIDER_SITE_OTHER): Payer: Self-pay | Admitting: Physician Assistant

## 2017-12-09 ENCOUNTER — Telehealth (INDEPENDENT_AMBULATORY_CARE_PROVIDER_SITE_OTHER): Payer: Self-pay

## 2017-12-09 LAB — CULTURE, BLOOD (ROUTINE X 2)
CULTURE: NO GROWTH
CULTURE: NO GROWTH
SPECIAL REQUESTS: ADEQUATE
SPECIAL REQUESTS: ADEQUATE

## 2017-12-09 NOTE — Telephone Encounter (Signed)
There are no liquid forms for Tamsulosin and Lovaza. Tell his caregivers to call pharmacist and ask if these could be split or opened. Thank you.

## 2017-12-09 NOTE — Telephone Encounter (Signed)
Wells Guiles from [facility] called to report that patient is having difficulty swallowing his pills. He can swallow the smaller pills but it takes a while. The larger pills like Tamsulosin and Lovaza he refuses to take. Facility is requesting medications be switched to liquid if possible. Also wanted to inform that the ED instructed patient to stop taking vitamins and motrin. [name], CMA

## 2017-12-10 NOTE — Telephone Encounter (Signed)
Facility is aware. [name], CMA

## 2017-12-17 ENCOUNTER — Emergency Department (HOSPITAL_COMMUNITY): Payer: Medicare Other

## 2017-12-17 ENCOUNTER — Emergency Department (HOSPITAL_COMMUNITY)
Admission: EM | Admit: 2017-12-17 | Discharge: 2017-12-18 | Disposition: A | Discharge status: Home / Self Care | Payer: Medicare Other | Attending: Emergency Medicine | Admitting: Emergency Medicine

## 2017-12-17 ENCOUNTER — Other Ambulatory Visit: Payer: Self-pay

## 2017-12-17 DIAGNOSIS — Z87891 Personal history of nicotine dependence: Secondary | ICD-10-CM | POA: Diagnosis not present

## 2017-12-17 DIAGNOSIS — F1010 Alcohol abuse, uncomplicated: Secondary | ICD-10-CM | POA: Insufficient documentation

## 2017-12-17 DIAGNOSIS — Z7982 Long term (current) use of aspirin: Secondary | ICD-10-CM | POA: Diagnosis not present

## 2017-12-17 DIAGNOSIS — N179 Acute kidney failure, unspecified: Secondary | ICD-10-CM | POA: Insufficient documentation

## 2017-12-17 DIAGNOSIS — N189 Chronic kidney disease, unspecified: Secondary | ICD-10-CM | POA: Insufficient documentation

## 2017-12-17 DIAGNOSIS — M79604 Pain in right leg: Secondary | ICD-10-CM

## 2017-12-17 DIAGNOSIS — Z79899 Other long term (current) drug therapy: Secondary | ICD-10-CM | POA: Insufficient documentation

## 2017-12-17 DIAGNOSIS — E1122 Type 2 diabetes mellitus with diabetic chronic kidney disease: Secondary | ICD-10-CM | POA: Insufficient documentation

## 2017-12-17 DIAGNOSIS — I129 Hypertensive chronic kidney disease with stage 1 through stage 4 chronic kidney disease, or unspecified chronic kidney disease: Secondary | ICD-10-CM | POA: Diagnosis not present

## 2017-12-17 LAB — CBC WITH DIFFERENTIAL/PLATELET
ABS IMMATURE GRANULOCYTES: 0 10*3/uL (ref 0.0–0.1)
Basophils Absolute: 0.1 10*3/uL (ref 0.0–0.1)
Basophils Relative: 1 %
Eosinophils Absolute: 0.7 10*3/uL (ref 0.0–0.7)
Eosinophils Relative: 6 %
HEMATOCRIT: 31.6 % — AB (ref 39.0–52.0)
Hemoglobin: 9.7 g/dL — ABNORMAL LOW (ref 13.0–17.0)
Immature Granulocytes: 0 %
LYMPHS ABS: 2.6 10*3/uL (ref 0.7–4.0)
Lymphocytes Relative: 23 %
MCH: 25 pg — AB (ref 26.0–34.0)
MCHC: 30.7 g/dL (ref 30.0–36.0)
MCV: 81.4 fL (ref 78.0–100.0)
MONO ABS: 0.8 10*3/uL (ref 0.1–1.0)
MONOS PCT: 7 %
NEUTROS ABS: 7.1 10*3/uL (ref 1.7–7.7)
NEUTROS PCT: 63 %
Platelets: 318 10*3/uL (ref 150–400)
RBC: 3.88 MIL/uL — ABNORMAL LOW (ref 4.22–5.81)
RDW: 13.3 % (ref 11.5–15.5)
WBC: 11.3 10*3/uL — ABNORMAL HIGH (ref 4.0–10.5)

## 2017-12-17 LAB — COMPREHENSIVE METABOLIC PANEL
ALK PHOS: 74 U/L (ref 38–126)
ALT: 11 U/L (ref 0–44)
AST: 20 U/L (ref 15–41)
Albumin: 3.4 g/dL — ABNORMAL LOW (ref 3.5–5.0)
Anion gap: 10 (ref 5–15)
BILIRUBIN TOTAL: 0.5 mg/dL (ref 0.3–1.2)
BUN: 38 mg/dL — ABNORMAL HIGH (ref 8–23)
CALCIUM: 9.3 mg/dL (ref 8.9–10.3)
CO2: 24 mmol/L (ref 22–32)
CREATININE: 2.28 mg/dL — AB (ref 0.61–1.24)
Chloride: 111 mmol/L (ref 98–111)
GFR calc non Af Amer: 26 mL/min — ABNORMAL LOW (ref >60)
GFR, EST AFRICAN AMERICAN: 30 mL/min — AB (ref >60)
GLUCOSE: 132 mg/dL — AB (ref 70–99)
Potassium: 4.5 mmol/L (ref 3.5–5.1)
SODIUM: 145 mmol/L (ref 135–145)
Total Protein: 7 g/dL (ref 6.5–8.1)

## 2017-12-17 MED ORDER — OXYCODONE HCL 5 MG PO TABS: 5.0000 mg | ORAL_TABLET | Freq: Four times a day (QID) | ORAL | 0 refills | Status: DC | PRN

## 2017-12-17 MED ORDER — LACTATED RINGERS IV BOLUS
1000.0000 mL | Freq: Once | INTRAVENOUS | Status: AC
  Administered 2017-12-17: 1000 mL via INTRAVENOUS

## 2017-12-17 MED ORDER — OXYCODONE HCL 5 MG PO TABS
5.0000 mg | ORAL_TABLET | Freq: Once | ORAL | Status: AC
  Administered 2017-12-17: 5 mg via ORAL
  Filled 2017-12-17: qty 1

## 2017-12-17 NOTE — ED Notes (Signed)
Pt ambulated in hall with assistance from staff and holding onto wall.

## 2017-12-17 NOTE — ED Notes (Signed)
Patient transported to X-ray 

## 2017-12-17 NOTE — ED Triage Notes (Signed)
Pt to ED via EMS from [facility] c/o weakness and bilateral leg pain today. Unable to stand and walk, while normally uses cane to ambulate. Decreased appetite per staff. No neuro deficits.

## 2017-12-17 NOTE — ED Notes (Signed)
Contacted pt's facility and spoke with [place] who gave me another social worker's name, Rudene Christians at [phone]. Attempted to contact and reached voicemail. Left voicemail for callback as well as calling two other social workers listed in chart for help with interpreting as [facility] does not provide service for language of pt.

## 2017-12-18 ENCOUNTER — Telehealth (INDEPENDENT_AMBULATORY_CARE_PROVIDER_SITE_OTHER): Payer: Self-pay | Admitting: Physician Assistant

## 2017-12-18 NOTE — ED Provider Notes (Signed)
[facility] EMERGENCY DEPARTMENT Provider Note   CSN: [id number] Arrival date & time: 12/17/17  1943     History   Chief Complaint Chief Complaint  Patient presents with  . Leg Pain  . Weakness    HPI Timothy [name] is a 77 y.o. male.  Had a fall yesterday. Pain in leg since then, difficulty walking.    Leg Pain   This is a new problem. The problem occurs constantly. The problem has not changed since onset.The pain is present in the right upper leg. The quality of the pain is described as aching and sharp. Associated symptoms include limited range of motion. The symptoms are aggravated by standing and activity. He has tried nothing for the symptoms. The treatment provided no relief. There has been a history of trauma.  Weakness     Past Medical History:  Diagnosis Date  . Alcohol abuse    Patient denies, but was shared with people from [facility] by his male roommate.  . Bilateral cataracts 04/08/2017  . DM (diabetes mellitus), type 2 with renal complications ([place])    uncertain when diagnosed  . HOH (hard of hearing)   . Hypertension   . Legally blind    B/L  . Poor vision     Patient Active Problem List   Diagnosis Date Noted  . Leukocytosis 12/02/2017  . Acute renal failure superimposed on chronic kidney disease ([place]) 12/01/2017  . ARF (acute renal failure) ([place]) 12/01/2017  . 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 PNA (pneumonia) 07/09/2017  . HCAP (healthcare-associated pneumonia)   . Hypertension 06/26/2017  . Hypertensive emergency 06/25/2017  . Acute kidney injury superimposed on chronic kidney disease ([place]) 06/25/2017  . Hyperkalemia 06/25/2017  . 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 Left retinal detachment   . 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 Bilateral cataracts 04/08/2017  . DM (diabetes mellitus), type 2 with renal complications ([place])   . Alcohol abuse   . Poor vision   . Chronic kidney disease 02/13/2017  .  Anemia of chronic disease 02/13/2017  . 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 Epigastric pain 02/11/2017  . Fall at home 11/16/2013  . Closed TBI (traumatic brain injury) ([place]) 11/16/2013  . SDH (subdural hematoma) ([place]) 11/15/2013    Past Surgical History:  Procedure Laterality Date  . MEMBRANE PEEL Right 09/29/2017   Procedure: MEMBRANE PEEL WITH AIR GAS;  Surgeon: Jalene Mullet, MD;  Location: [facility];  Service: Ophthalmology;  Laterality: Right;  . PARS PLANA VITRECTOMY Left 06/29/2017   Procedure: PARS PLANA VITRECTOMY WITH 25 GAUGE AND MEMBRANE PEEL WITH GAS EXCHANGE, AIR SILICONE OIL AND PHACO COAGULATION, LASER;  Surgeon: Jalene Mullet, MD;  Location: [facility] [facility];  Service: Ophthalmology;  Laterality: Left;  . PARS PLANA VITRECTOMY Right 09/18/2017   Procedure: PARS PLANA VITRECTOMY WITH 25 GAUGE MEMBRANE PILL WITH AIR GAS SILICONE OIL AND PHOTOCOAGULATION;  Surgeon: Jalene Mullet, MD;  Location: [place];  Service: Ophthalmology;  Laterality: Right;  . PARS PLANA VITRECTOMY Right 09/29/2017   Procedure: PARS PLANA VITRECTOMY WITH 25 GAUGE;  Surgeon: Jalene Mullet, MD;  Location: [place];  Service: Ophthalmology;  Laterality: Right;  . PHOTOCOAGULATION Right 09/29/2017   Procedure: PHOTOCOAGULATION;  Surgeon: Jalene Mullet, MD;  Location: [place];  Service: Ophthalmology;  Laterality: Right;  . REPAIR OF COMPLEX TRACTION RETINAL DETACHMENT Right 09/29/2017   Procedure: REPAIR OF COMPLEX TRACTION RETINAL DETACHMENT;  Surgeon: Jalene Mullet, MD;  Location: [place];  Service:  Ophthalmology;  Laterality: Right;        Home Medications    Prior to Admission medications   Medication Sig Start Date End Date Taking? Authorizing Provider  albuterol (PROVENTIL HFA;VENTOLIN HFA) 108 (90 Base) MCG/ACT inhaler Inhale 2 puffs into the lungs every 6 (six) hours as needed for wheezing or shortness of breath. 08/24/17   [name], PA-C  amLODipine (NORVASC) 10 MG tablet Take 1 tablet (10 mg total) by  mouth daily. 08/24/17   [name], PA-C  aspirin 81 MG chewable tablet Chew 1 tablet (81 mg total) by mouth daily. 08/24/17   [name], PA-C  atorvastatin (LIPITOR) 20 MG tablet Take 1 tablet (20 mg total) by mouth daily. 11/30/17   [name], PA-C  carvedilol (COREG) 6.25 MG tablet Take 1 tablet (6.25 mg total) by mouth 2 (two) times daily with a meal. 08/24/17   [name], PA-C  ferrous gluconate [facility]) 324 MG tablet Take 1 tablet (324 mg total) by mouth daily with breakfast. 08/24/17   [name], PA-C  folic acid (FOLVITE) 1 MG tablet Take 1 tablet (1 mg total) by mouth daily. 08/24/17   [name], PA-C  hydrALAZINE (APRESOLINE) 10 MG tablet Take 1 tablet (10 mg total) by mouth 3 (three) times daily. 10/27/17   [name], PA-C  isosorbide mononitrate (IMDUR) 30 MG 24 hr tablet Take 1 tablet (30 mg total) by mouth daily. 11/30/17   [name], PA-C  omega-3 acid ethyl esters (LOVAZA) 1 g capsule Take 2 capsules (2 g total) by mouth 2 (two) times daily. 11/30/17 02/28/18  [name], PA-C  oxyCODONE (ROXICODONE) 5 MG immediate release tablet Take 1 tablet (5 mg total) by mouth every 6 (six) hours as needed for severe pain. 12/17/17   Antrone Walla, Corene Cornea, MD  sodium bicarbonate 650 MG tablet Take 1 tablet (650 mg total) by mouth 2 (two) times daily. 12/06/17   Regalado, Belkys A, MD  tamsulosin (FLOMAX) 0.4 MG CAPS capsule Take 1 capsule (0.4 mg total) by mouth daily. 12/07/17   Regalado, Belkys A, MD  thiamine (VITAMIN B-1) 100 MG tablet Take 100 mg by mouth daily.    [provider]    Family History No family history on file.  Social History Social History   Tobacco Use  . Smoking status: Former Smoker    Types: Cigarettes    Last attempt to quit: 05/19/1990    Years since quitting: 27.6  . Smokeless tobacco: Never Used  Substance Use Topics  . Alcohol use: Yes    Comment: UTA he drinks "alot" according to grandson; last  drink 01/2017 per [employer] officer, [facility]  . Drug use: No     Allergies   Patient has no known allergies.   Review of Systems Review of Systems  Neurological: Positive for weakness.  All other systems reviewed and are negative.    Physical Exam Updated Vital Signs BP (!) 166/66 (BP Location: Left Arm)   Pulse 71   Temp 98.4 F (36.9 C) (Oral)   Resp 16   Ht 5' (1.524 m)   Wt 47.6 kg (105 lb)   SpO2 97%   BMI 20.51 kg/m   Physical Exam  Constitutional: He appears well-developed and well-nourished.  HENT:  Head: Normocephalic and atraumatic.  Eyes: Conjunctivae and EOM are normal.  Neck: Normal range of motion.  Cardiovascular: Normal rate.  Pulmonary/Chest: Effort normal. No respiratory distress.  Abdominal: Soft. He  exhibits no distension.  Neurological: He is alert.  Skin: Skin is warm and dry.  Nursing note and vitals reviewed.    ED Treatments / Results  Labs (all labs ordered are listed, but only abnormal results are displayed) Labs Reviewed  CBC WITH DIFFERENTIAL/PLATELET - Abnormal; Notable for the following components:      Result Value   WBC 11.3 (*)    RBC 3.88 (*)    Hemoglobin 9.7 (*)    HCT 31.6 (*)    MCH 25.0 (*)    All other components within normal limits  COMPREHENSIVE METABOLIC PANEL - Abnormal; Notable for the following components:   Glucose, Bld 132 (*)    BUN 38 (*)    Creatinine, Ser 2.28 (*)    Albumin 3.4 (*)    GFR calc non Af Amer 26 (*)    GFR calc Af Amer 30 (*)    All other components within normal limits    EKG None  Radiology Dg Pelvis 1-2 Views  Result Date: 12/17/2017 CLINICAL DATA:  Bilateral leg pain for 2 days.  No known injury. EXAM: PELVIS - 1-2 VIEW COMPARISON:  None. FINDINGS: The cortical margins of the bony pelvis are intact. No fracture. Pubic symphysis and sacroiliac joints are congruent. Both femoral heads are well-seated in the respective acetabula. Normal for age acetabular spurring. Advanced vascular  calcifications. IMPRESSION: 1. No acute osseous abnormality or explanation for bilateral leg pain. 2. Advanced vascular calcifications. Electronically Signed   By: Jeb Levering M.D.   On: 12/17/2017 22:16   Dg Femur Min 2 Views Left  Result Date: 12/17/2017 CLINICAL DATA:  Bilateral leg pain for 2 days. No known injury. EXAM: LEFT FEMUR 2 VIEWS COMPARISON:  None. FINDINGS: There is no evidence of fracture or other focal bone lesions. No cortical thickening or periosteal reaction. No bony destructive change. Knee alignment is maintained. Advanced vascular calcifications. Soft tissues are otherwise unremarkable. IMPRESSION: 1. No acute osseous abnormality or osseous explanation for leg pain. 2. Advanced vascular calcifications. Electronically Signed   By: Jeb Levering M.D.   On: 12/17/2017 22:17   Dg Femur Min 2 Views Right  Result Date: 12/17/2017 CLINICAL DATA:  Bilateral leg pain for 2 days. No known injury. EXAM: RIGHT FEMUR 2 VIEWS COMPARISON:  None. FINDINGS: There is no evidence of fracture or other focal bone lesions. No cortical thickening or periosteal reaction. No bony destructive change. Knee alignment is maintained. Advanced vascular calcifications. Soft tissues are otherwise unremarkable. IMPRESSION: 1. No acute osseous abnormality or osseous explanation for leg pain. 2. Advanced vascular calcifications. Electronically Signed   By: Jeb Levering M.D.   On: 12/17/2017 22:17    Procedures Procedures (including critical care time)  Medications Ordered in ED Medications  oxyCODONE (Oxy IR/ROXICODONE) immediate release tablet 5 mg (5 mg Oral Given 12/17/17 2134)  lactated ringers bolus 1,000 mL (0 mLs Intravenous Stopped 12/17/17 2357)     Initial Impression / Assessment and Plan / ED Course  I have reviewed the triage vital signs and the nursing notes.  Pertinent labs & imaging results that were available during my care of the patient were reviewed by me and considered in my  medical decision making (see chart for details).     NVI. xrays negative. Ambulates with small amount of pain medicine. Will dc to facility. If not improving then may need MRI. Mild AKI, fluids given, needs to continue hydrating at home.   Final Clinical Impressions(s) / ED Diagnoses  Final diagnoses:  AKI (acute kidney injury) ([place])  Right leg pain    ED Discharge Orders        Ordered    oxyCODONE (ROXICODONE) 5 MG immediate release tablet  Every 6 hours PRN     12/17/17 2250       Demarri Elie, Corene Cornea, MD 12/18/17 3838

## 2017-12-18 NOTE — Telephone Encounter (Signed)
FWD to PCP. Tempestt S Roberts, CMA  

## 2017-12-18 NOTE — Telephone Encounter (Signed)
Timothy [name] from [employer] called to inform that Timothy [name] was complaning of leg pain and the pain was on the back of his calves. Timothy [name] was sent to the ER last night and his report states right leg pain only.   Thank you Emmit Pomfret

## 2017-12-18 NOTE — Telephone Encounter (Signed)
I have reviewed his xrays done at the ED. Xrays done on both leg and right legs. Seems he may have issues with circulation. He may go to ED if pain is persistent or worrisome.

## 2017-12-21 NOTE — Telephone Encounter (Signed)
Called [facility] and spoke to [place]  And informed about Timothy [name] having bad circulation in both legs and if pain persisted to go to the ED.   Thank you Emmit Pomfret

## 2017-12-31 ENCOUNTER — Ambulatory Visit (INDEPENDENT_AMBULATORY_CARE_PROVIDER_SITE_OTHER): Payer: Medicare Other | Admitting: Physician Assistant

## 2018-01-01 ENCOUNTER — Ambulatory Visit (INDEPENDENT_AMBULATORY_CARE_PROVIDER_SITE_OTHER): Payer: Medicare Other | Admitting: Physician Assistant
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
## 2018-01-05 ENCOUNTER — Ambulatory Visit (INDEPENDENT_AMBULATORY_CARE_PROVIDER_SITE_OTHER): Payer: Medicare Other | Admitting: Pulmonary Disease

## 2018-01-05 ENCOUNTER — Telehealth: Payer: Self-pay | Admitting: Pulmonary Disease

## 2018-01-05 ENCOUNTER — Other Ambulatory Visit: Payer: Medicare Other

## 2018-01-05 ENCOUNTER — Encounter: Payer: Self-pay | Admitting: Pulmonary Disease

## 2018-01-05 VITALS — BP 110/70 | HR 80 | Ht 60.0 in | Wt 103.4 lb

## 2018-01-05 DIAGNOSIS — J479 Bronchiectasis, uncomplicated: Secondary | ICD-10-CM | POA: Diagnosis not present

## 2018-01-05 DIAGNOSIS — R911 Solitary pulmonary nodule: Secondary | ICD-10-CM | POA: Diagnosis not present

## 2018-01-05 MED ORDER — FLUTTER DEVI: 1.0000 | 0 refills | Status: DC

## 2018-01-05 MED ORDER — IPRATROPIUM-ALBUTEROL 0.5-2.5 (3) MG/3ML IN SOLN
3.0000 mL | Freq: Once | RESPIRATORY_TRACT | Status: AC
  Administered 2018-01-05: 3 mL via RESPIRATORY_TRACT

## 2018-01-05 NOTE — Telephone Encounter (Signed)
Great thank you  Garner Nash, DO [facility] 01/05/2018 4:42 PM

## 2018-01-05 NOTE — Telephone Encounter (Signed)
Called and spoke with Sharee Pimple from the nursing home. She states that she will try to have the patient cough up sputum so that it can be brought back to the lab. Will route this to BI.

## 2018-01-05 NOTE — Patient Instructions (Addendum)
We will give you sample cups for sputum samples today.  We will order labs to be completed as well.  Return to clinic 4-6 weeks with Timothy Quaker, NP

## 2018-01-05 NOTE — Telephone Encounter (Signed)
Tried to call Barrie Lyme in Labs at [phone] regarding sputum sample cups on pt, busy signal X2.  Spoke with Sharee Pimple at Nursing home of patient, she is sending driver back to labs today to pick up additional sputum cup for samples today.  Routing message to Corrine to update BI

## 2018-01-08 LAB — IGG, IGA, IGM
IgG (Immunoglobin G), Serum: 1424 mg/dL (ref 600–1540)
IgM, Serum: 116 mg/dL (ref 50–300)
Immunoglobulin A: 372 mg/dL — ABNORMAL HIGH (ref 20–320)

## 2018-01-08 LAB — QUANTIFERON-TB GOLD PLUS
Mitogen-NIL: >10 IU/mL
NIL: 0.05 IU/mL
QuantiFERON-TB Gold Plus: NEGATIVE
TB1-NIL: <0 IU/mL

## 2018-01-08 LAB — ANA,IFA RA DIAG PNL W/RFLX TIT/PATN
ANA: NEGATIVE
Cyclic Citrullin Peptide Ab: 33 UNITS — ABNORMAL HIGH
Rhuematoid fact SerPl-aCnc: <14 IU/mL (ref <14)

## 2018-01-08 LAB — IGE: IgE (Immunoglobulin E), Serum: 63 kU/L (ref <=114)

## 2018-01-08 LAB — ALPHA-1 ANTITRYPSIN PHENOTYPE: A-1 Antitrypsin, Ser: 162 mg/dL (ref 83–199)

## 2018-01-17 DIAGNOSIS — I639 Cerebral infarction, unspecified: Secondary | ICD-10-CM

## 2018-01-17 HISTORY — DX: Cerebral infarction, unspecified: I63.9

## 2018-01-18 ENCOUNTER — Emergency Department (HOSPITAL_COMMUNITY): Payer: Medicare Other

## 2018-01-18 ENCOUNTER — Other Ambulatory Visit: Payer: Self-pay

## 2018-01-18 ENCOUNTER — Inpatient Hospital Stay (HOSPITAL_COMMUNITY)
Admission: EM | Admit: 2018-01-18 | Discharge: 2018-01-22 | DRG: 065 | Disposition: A | Discharge status: Skilled Nursing Facility | Payer: Medicare Other | Source: Skilled Nursing Facility | Attending: Family Medicine | Admitting: Family Medicine

## 2018-01-18 ENCOUNTER — Inpatient Hospital Stay (HOSPITAL_COMMUNITY): Payer: Medicare Other

## 2018-01-18 ENCOUNTER — Encounter (HOSPITAL_COMMUNITY): Payer: Self-pay | Admitting: Emergency Medicine

## 2018-01-18 DIAGNOSIS — R64 Cachexia: Secondary | ICD-10-CM | POA: Diagnosis present

## 2018-01-18 DIAGNOSIS — N189 Chronic kidney disease, unspecified: Secondary | ICD-10-CM

## 2018-01-18 DIAGNOSIS — N183 Chronic kidney disease, stage 3 unspecified: Secondary | ICD-10-CM

## 2018-01-18 DIAGNOSIS — R29707 NIHSS score 7: Secondary | ICD-10-CM | POA: Diagnosis present

## 2018-01-18 DIAGNOSIS — Z8673 Personal history of transient ischemic attack (TIA), and cerebral infarction without residual deficits: Secondary | ICD-10-CM | POA: Diagnosis present

## 2018-01-18 DIAGNOSIS — R9431 Abnormal electrocardiogram [ECG] [EKG]: Secondary | ICD-10-CM | POA: Diagnosis present

## 2018-01-18 DIAGNOSIS — E11319 Type 2 diabetes mellitus with unspecified diabetic retinopathy without macular edema: Secondary | ICD-10-CM | POA: Diagnosis present

## 2018-01-18 DIAGNOSIS — E1122 Type 2 diabetes mellitus with diabetic chronic kidney disease: Secondary | ICD-10-CM | POA: Diagnosis present

## 2018-01-18 DIAGNOSIS — I6300 Cerebral infarction due to thrombosis of unspecified precerebral artery: Secondary | ICD-10-CM | POA: Diagnosis not present

## 2018-01-18 DIAGNOSIS — H9190 Unspecified hearing loss, unspecified ear: Secondary | ICD-10-CM | POA: Diagnosis present

## 2018-01-18 DIAGNOSIS — F1010 Alcohol abuse, uncomplicated: Secondary | ICD-10-CM | POA: Diagnosis present

## 2018-01-18 DIAGNOSIS — Z681 Body mass index (BMI) 19 or less, adult: Secondary | ICD-10-CM | POA: Diagnosis not present

## 2018-01-18 DIAGNOSIS — E785 Hyperlipidemia, unspecified: Secondary | ICD-10-CM | POA: Diagnosis present

## 2018-01-18 DIAGNOSIS — I10 Essential (primary) hypertension: Secondary | ICD-10-CM | POA: Diagnosis present

## 2018-01-18 DIAGNOSIS — Z79899 Other long term (current) drug therapy: Secondary | ICD-10-CM

## 2018-01-18 DIAGNOSIS — E1129 Type 2 diabetes mellitus with other diabetic kidney complication: Secondary | ICD-10-CM | POA: Diagnosis present

## 2018-01-18 DIAGNOSIS — N170 Acute kidney failure with tubular necrosis: Secondary | ICD-10-CM

## 2018-01-18 DIAGNOSIS — Z87891 Personal history of nicotine dependence: Secondary | ICD-10-CM

## 2018-01-18 DIAGNOSIS — I63442 Cerebral infarction due to embolism of left cerebellar artery: Principal | ICD-10-CM | POA: Diagnosis present

## 2018-01-18 DIAGNOSIS — R29810 Facial weakness: Secondary | ICD-10-CM | POA: Diagnosis present

## 2018-01-18 DIAGNOSIS — F0391 Unspecified dementia with behavioral disturbance: Secondary | ICD-10-CM | POA: Diagnosis present

## 2018-01-18 DIAGNOSIS — H548 Legal blindness, as defined in USA: Secondary | ICD-10-CM | POA: Diagnosis present

## 2018-01-18 DIAGNOSIS — Z79891 Long term (current) use of opiate analgesic: Secondary | ICD-10-CM

## 2018-01-18 DIAGNOSIS — N184 Chronic kidney disease, stage 4 (severe): Secondary | ICD-10-CM

## 2018-01-18 DIAGNOSIS — R400 Somnolence: Secondary | ICD-10-CM

## 2018-01-18 DIAGNOSIS — D509 Iron deficiency anemia, unspecified: Secondary | ICD-10-CM | POA: Diagnosis present

## 2018-01-18 DIAGNOSIS — Z7982 Long term (current) use of aspirin: Secondary | ICD-10-CM

## 2018-01-18 DIAGNOSIS — Z781 Physical restraint status: Secondary | ICD-10-CM

## 2018-01-18 DIAGNOSIS — R4182 Altered mental status, unspecified: Secondary | ICD-10-CM | POA: Diagnosis present

## 2018-01-18 DIAGNOSIS — I129 Hypertensive chronic kidney disease with stage 1 through stage 4 chronic kidney disease, or unspecified chronic kidney disease: Secondary | ICD-10-CM | POA: Diagnosis present

## 2018-01-18 DIAGNOSIS — J479 Bronchiectasis, uncomplicated: Secondary | ICD-10-CM | POA: Diagnosis present

## 2018-01-18 DIAGNOSIS — I639 Cerebral infarction, unspecified: Secondary | ICD-10-CM | POA: Diagnosis not present

## 2018-01-18 DIAGNOSIS — I351 Nonrheumatic aortic (valve) insufficiency: Secondary | ICD-10-CM | POA: Diagnosis not present

## 2018-01-18 DIAGNOSIS — N179 Acute kidney failure, unspecified: Secondary | ICD-10-CM | POA: Diagnosis present

## 2018-01-18 DIAGNOSIS — R402232 Coma scale, best verbal response, inappropriate words, at arrival to emergency department: Secondary | ICD-10-CM | POA: Diagnosis present

## 2018-01-18 DIAGNOSIS — D631 Anemia in chronic kidney disease: Secondary | ICD-10-CM | POA: Diagnosis present

## 2018-01-18 DIAGNOSIS — E1121 Type 2 diabetes mellitus with diabetic nephropathy: Secondary | ICD-10-CM | POA: Diagnosis not present

## 2018-01-18 DIAGNOSIS — R402142 Coma scale, eyes open, spontaneous, at arrival to emergency department: Secondary | ICD-10-CM | POA: Diagnosis present

## 2018-01-18 DIAGNOSIS — E875 Hyperkalemia: Secondary | ICD-10-CM | POA: Diagnosis present

## 2018-01-18 DIAGNOSIS — H53461 Homonymous bilateral field defects, right side: Secondary | ICD-10-CM | POA: Diagnosis present

## 2018-01-18 DIAGNOSIS — R402352 Coma scale, best motor response, localizes pain, at arrival to emergency department: Secondary | ICD-10-CM | POA: Diagnosis present

## 2018-01-18 DIAGNOSIS — R451 Restlessness and agitation: Secondary | ICD-10-CM | POA: Diagnosis not present

## 2018-01-18 DIAGNOSIS — H269 Unspecified cataract: Secondary | ICD-10-CM | POA: Diagnosis present

## 2018-01-18 LAB — I-STAT TROPONIN, ED: Troponin i, poc: 0.01 ng/mL (ref 0.00–0.08)

## 2018-01-18 LAB — CBC WITH DIFFERENTIAL/PLATELET
ABS IMMATURE GRANULOCYTES: 0.1 10*3/uL (ref 0.0–0.1)
Basophils Absolute: 0.1 10*3/uL (ref 0.0–0.1)
Basophils Relative: 1 %
Eosinophils Absolute: 0.6 10*3/uL (ref 0.0–0.7)
Eosinophils Relative: 5 %
HCT: 33.7 % — ABNORMAL LOW (ref 39.0–52.0)
HEMOGLOBIN: 10.2 g/dL — AB (ref 13.0–17.0)
Immature Granulocytes: 1 %
LYMPHS PCT: 20 %
Lymphs Abs: 2.6 10*3/uL (ref 0.7–4.0)
MCH: 24.5 pg — ABNORMAL LOW (ref 26.0–34.0)
MCHC: 30.3 g/dL (ref 30.0–36.0)
MCV: 80.8 fL (ref 78.0–100.0)
Monocytes Absolute: 0.8 10*3/uL (ref 0.1–1.0)
Monocytes Relative: 6 %
NEUTROS ABS: 8.9 10*3/uL — AB (ref 1.7–7.7)
NEUTROS PCT: 69 %
PLATELETS: 291 10*3/uL (ref 150–400)
RBC: 4.17 MIL/uL — AB (ref 4.22–5.81)
RDW: 12.8 % (ref 11.5–15.5)
WBC: 12.9 10*3/uL — AB (ref 4.0–10.5)

## 2018-01-18 LAB — MRSA PCR SCREENING: MRSA BY PCR: POSITIVE — AB

## 2018-01-18 LAB — URINALYSIS, ROUTINE W REFLEX MICROSCOPIC
BILIRUBIN URINE: NEGATIVE
Glucose, UA: 50 mg/dL — AB
HGB URINE DIPSTICK: NEGATIVE
KETONES UR: NEGATIVE mg/dL
Leukocytes, UA: NEGATIVE
NITRITE: NEGATIVE
Protein, ur: >=300 mg/dL — AB
Specific Gravity, Urine: 1.013 (ref 1.005–1.030)
pH: 5 (ref 5.0–8.0)

## 2018-01-18 LAB — COMPREHENSIVE METABOLIC PANEL
ALBUMIN: 3.5 g/dL (ref 3.5–5.0)
ALT: 14 U/L (ref 0–44)
ANION GAP: 10 (ref 5–15)
AST: 30 U/L (ref 15–41)
Alkaline Phosphatase: 83 U/L (ref 38–126)
BILIRUBIN TOTAL: 0.3 mg/dL (ref 0.3–1.2)
BUN: 50 mg/dL — ABNORMAL HIGH (ref 8–23)
CO2: 22 mmol/L (ref 22–32)
Calcium: 9.2 mg/dL (ref 8.9–10.3)
Chloride: 108 mmol/L (ref 98–111)
Creatinine, Ser: 3.26 mg/dL — ABNORMAL HIGH (ref 0.61–1.24)
GFR calc non Af Amer: 17 mL/min — ABNORMAL LOW (ref >60)
GFR, EST AFRICAN AMERICAN: 19 mL/min — AB (ref >60)
Glucose, Bld: 168 mg/dL — ABNORMAL HIGH (ref 70–99)
POTASSIUM: 5.9 mmol/L — AB (ref 3.5–5.1)
SODIUM: 140 mmol/L (ref 135–145)
TOTAL PROTEIN: 7.3 g/dL (ref 6.5–8.1)

## 2018-01-18 LAB — APTT: aPTT: 30 seconds (ref 24–36)

## 2018-01-18 LAB — LIPID PANEL
CHOLESTEROL: 130 mg/dL (ref 0–200)
HDL: 41 mg/dL (ref >40)
LDL Cholesterol: 45 mg/dL (ref 0–99)
TRIGLYCERIDES: 218 mg/dL — AB (ref <150)
Total CHOL/HDL Ratio: 3.2 RATIO
VLDL: 44 mg/dL — ABNORMAL HIGH (ref 0–40)

## 2018-01-18 LAB — RAPID URINE DRUG SCREEN, HOSP PERFORMED
AMPHETAMINES: NOT DETECTED
BENZODIAZEPINES: NOT DETECTED
Barbiturates: NOT DETECTED
Cocaine: NOT DETECTED
OPIATES: NOT DETECTED
TETRAHYDROCANNABINOL: NOT DETECTED

## 2018-01-18 LAB — PROTIME-INR
INR: 0.96
Prothrombin Time: 12.7 seconds (ref 11.4–15.2)

## 2018-01-18 LAB — TROPONIN I: Troponin I: <0.03 ng/mL (ref <0.03)

## 2018-01-18 LAB — ETHANOL: Alcohol, Ethyl (B): <10 mg/dL (ref <10)

## 2018-01-18 MED ORDER — SODIUM CHLORIDE 0.9 % IV SOLN
INTRAVENOUS | Status: DC
  Administered 2018-01-18: 20 mL/h via INTRAVENOUS

## 2018-01-18 MED ORDER — ENOXAPARIN SODIUM 30 MG/0.3ML ~~LOC~~ SOLN
30.0000 mg | SUBCUTANEOUS | Status: DC
  Administered 2018-01-19 – 2018-01-21 (×4): 30 mg via SUBCUTANEOUS
  Filled 2018-01-18 (×4): qty 0.3

## 2018-01-18 MED ORDER — STROKE: EARLY STAGES OF RECOVERY BOOK
Freq: Once | Status: DC
  Filled 2018-01-18 (×2): qty 1

## 2018-01-18 MED ORDER — ACETAMINOPHEN 325 MG PO TABS
650.0000 mg | ORAL_TABLET | ORAL | Status: DC | PRN
  Administered 2018-01-21: 650 mg via ORAL
  Filled 2018-01-18: qty 2

## 2018-01-18 MED ORDER — ASPIRIN 81 MG PO CHEW
324.0000 mg | CHEWABLE_TABLET | Freq: Once | ORAL | Status: DC
  Filled 2018-01-18: qty 4

## 2018-01-18 MED ORDER — SODIUM CHLORIDE 0.9 % IV SOLN
INTRAVENOUS | Status: DC
  Administered 2018-01-18: 16:00:00 via INTRAVENOUS

## 2018-01-18 MED ORDER — CHLORHEXIDINE GLUCONATE CLOTH 2 % EX PADS
6.0000 | MEDICATED_PAD | Freq: Every day | CUTANEOUS | Status: DC
  Administered 2018-01-19 – 2018-01-22 (×4): 6 via TOPICAL

## 2018-01-18 MED ORDER — SODIUM CHLORIDE 0.9 % IV SOLN
INTRAVENOUS | Status: DC
  Administered 2018-01-18 – 2018-01-20 (×5): via INTRAVENOUS
  Administered 2018-01-21: 1000 mL via INTRAVENOUS
  Administered 2018-01-21 – 2018-01-22 (×2): via INTRAVENOUS

## 2018-01-18 MED ORDER — ACETAMINOPHEN 160 MG/5ML PO SOLN: 650.0000 mg | ORAL | Status: DC | PRN

## 2018-01-18 MED ORDER — ACETAMINOPHEN 650 MG RE SUPP: 650.0000 mg | RECTAL | Status: DC | PRN

## 2018-01-18 MED ORDER — MUPIROCIN 2 % EX OINT
1.0000 "application " | TOPICAL_OINTMENT | Freq: Two times a day (BID) | CUTANEOUS | Status: DC
  Administered 2018-01-19 – 2018-01-22 (×7): 1 via NASAL
  Filled 2018-01-18 (×3): qty 22

## 2018-01-18 MED ORDER — SODIUM CHLORIDE 0.9 % IV BOLUS
500.0000 mL | Freq: Once | INTRAVENOUS | Status: AC
  Administered 2018-01-18: 500 mL via INTRAVENOUS

## 2018-01-18 MED ORDER — HEPARIN SODIUM (PORCINE) 5000 UNIT/ML IJ SOLN
60.0000 [IU]/kg | Freq: Once | INTRAMUSCULAR | Status: AC
  Administered 2018-01-18: 2800 [IU] via INTRAVENOUS
  Filled 2018-01-18: qty 1

## 2018-01-18 MED ORDER — SENNOSIDES-DOCUSATE SODIUM 8.6-50 MG PO TABS: 1.0000 | ORAL_TABLET | Freq: Every evening | ORAL | Status: DC | PRN

## 2018-01-18 NOTE — ED Notes (Signed)
Cataract surgery last week, RN who is case worker from International aid/development worker at [facility] stating that he's been hypertensive for past few months. States he has hx of dementia, and is alert to self only at baseline.

## 2018-01-18 NOTE — Consult Note (Addendum)
Neurology Consultation  Reason for Consult: Cerebellar stroke Referring Physician: Tomi Bamberger   History is obtained from: Chart as patient cannot speak English, and would not take part in interpreter  HPI: Timothy [name] is a 77 y.o. male who lives at a SNF and was noted to have altered mental status today.  He has a history of alcohol abuse, bilateral cataracts, diabetes, hard of hearing, hypertension, legally blind, poor vision.  Patient was apparently as stated above brought to the hospital secondary to altered mental status.  Per EMS reports the nursing home was not able to clearly tell a normal baseline however it is stated that he was only aware of himself.  While in the hospital EMS performed an EKG which was noted to have some abnormalities.  While in the ED due to possible STEMI patient did receive a heparin injection of 2800 units.  At this point patient was sent for a CT scan for his altered mental status and showed a subacute left cerebellar infarct.  Fourth ventricle was open.  Is it also showed a small area of low attenuation within the left frontal lobe most compatible with age-indeterminate infarct.   LKW: Unknown tpa given?: no, due to unknown last known normal Premorbid modified Rankin scale (mRS): 5 NIHSS 7   ROS: Unable to obtain due to altered mental status.   Past Medical History:  Diagnosis Date  . Alcohol abuse    Patient denies, but was shared with people from [facility] by his male roommate.  . Bilateral cataracts 04/08/2017  . DM (diabetes mellitus), type 2 with renal complications ([place])    uncertain when diagnosed  . HOH (hard of hearing)   . Hypertension   . Legally blind    B/L  . Poor vision     Family History  Problem Relation Age of Onset  . Lung disease Neg Hx      Social History:   reports that he quit smoking about 27 years ago. His smoking use included cigarettes. He has never used smokeless tobacco. He reports that he drinks alcohol. He reports that he  does not use drugs.  Medications  Current Facility-Administered Medications:  .  0.9 %  sodium chloride infusion, , Intravenous, Continuous, Dorie Rank, MD, Stopped at 01/18/18 1603 .  0.9 %  sodium chloride infusion, , Intravenous, Continuous, Dorie Rank, MD, Last Rate: 125 mL/hr at 01/18/18 1543 .  aspirin chewable tablet 324 mg, 324 mg, Oral, Once, Dorie Rank, MD, Stopped at 01/18/18 1550  Current Outpatient Medications:  .  albuterol (PROVENTIL HFA;VENTOLIN HFA) 108 (90 Base) MCG/ACT inhaler, Inhale 2 puffs into the lungs every 6 (six) hours as needed for wheezing or shortness of breath., Disp: 1 Inhaler, Rfl: 2 .  amLODipine (NORVASC) 10 MG tablet, Take 1 tablet (10 mg total) by mouth daily., Disp: 30 tablet, Rfl: 5 .  aspirin 81 MG chewable tablet, Chew 1 tablet (81 mg total) by mouth daily., Disp: 30 tablet, Rfl: 11 .  atorvastatin (LIPITOR) 20 MG tablet, Take 1 tablet (20 mg total) by mouth daily., Disp: 90 tablet, Rfl: 3 .  carvedilol (COREG) 6.25 MG tablet, Take 1 tablet (6.25 mg total) by mouth 2 (two) times daily with a meal., Disp: 60 tablet, Rfl: 5 .  ferrous gluconate (FERGON) 324 MG tablet, Take 1 tablet (324 mg total) by mouth daily with breakfast., Disp: 30 tablet, Rfl: 5 .  folic acid (FOLVITE) 1 MG tablet, Take 1 tablet (1 mg total) by mouth daily., Disp:  30 tablet, Rfl: 5 .  hydrALAZINE (APRESOLINE) 10 MG tablet, Take 1 tablet (10 mg total) by mouth 3 (three) times daily., Disp: 90 tablet, Rfl: 5 .  isosorbide mononitrate (IMDUR) 30 MG 24 hr tablet, Take 1 tablet (30 mg total) by mouth daily., Disp: 30 tablet, Rfl: 5 .  omega-3 acid ethyl esters (LOVAZA) 1 g capsule, Take 2 capsules (2 g total) by mouth 2 (two) times daily., Disp: 90 capsule, Rfl: 1 .  oxyCODONE (ROXICODONE) 5 MG immediate release tablet, Take 1 tablet (5 mg total) by mouth every 6 (six) hours as needed for severe pain., Disp: 10 tablet, Rfl: 0 .  Respiratory Therapy Supplies (FLUTTER) DEVI, 1 Device by Does  not apply route as directed., Disp: 1 each, Rfl: 0 .  sodium bicarbonate 650 MG tablet, Take 1 tablet (650 mg total) by mouth 2 (two) times daily., Disp: 30 tablet, Rfl: 0 .  tamsulosin (FLOMAX) 0.4 MG CAPS capsule, Take 1 capsule (0.4 mg total) by mouth daily., Disp: 30 capsule, Rfl: 0 .  thiamine (VITAMIN B-1) 100 MG tablet, Take 100 mg by mouth daily., Disp: , Rfl:    Exam: Current vital signs: BP (!) 152/77   Pulse 73   Resp 14   Ht 5\' 2"  (1.575 m)   Wt 46.9 kg   SpO2 100%   BMI 18.91 kg/m  Vital signs in last 24 hours: Pulse Rate:  [63-73] 73 (09/02 1621) Resp:  [14-16] 14 (09/02 1621) BP: (152-169)/(75-96) 152/77 (09/02 1621) SpO2:  [100 %] 100 % (09/02 1621) Weight:  [46.9 kg] 46.9 kg (09/02 1523)  GENERAL: alert in NAD HEENT: - Normocephalic and atraumatic, dry mm, no LN++, no Thyromegally Ext: warm, well perfused, intact peripheral pulses  NEURO:  Mental Status: A does not speak [place] but only [place].  Would not speak to the tele-prompter only words I could clearly understand were ouch.  Was able to follow visual commands to some extent Cranial Nerves: PERRL 2 mm/brisk. EOMI, blinks to threat bilaterally, no facial asymmetry, facial sensation intact, hearing decreased,  Motor: Patient moves all extremities with 5/5 strength briskly to noxious stimuli Tone: Normal Sensation-as above Coordination: Could not get patient do heel-to-shin however I did again do finger-nose and did not know any significant dysmetria however there was some difficulty with him not fully understanding what I was asking him to do Gait- deferred  Labs I have reviewed labs in epic and the results pertinent to this consultation are:   CBC    Component Value Date/Time   WBC 12.9 (H) 01/18/2018 1526   RBC 4.17 (L) 01/18/2018 1526   HGB 10.2 (L) 01/18/2018 1526   HGB 9.8 (L) 11/30/2017 1110   HCT 33.7 (L) 01/18/2018 1526   HCT 30.7 (L) 11/30/2017 1110   PLT 291 01/18/2018 1526   PLT  325 11/30/2017 1110   MCV 80.8 01/18/2018 1526   MCV 77 (L) 11/30/2017 1110   MCH 24.5 (L) 01/18/2018 1526   MCHC 30.3 01/18/2018 1526   RDW 12.8 01/18/2018 1526   RDW 16.1 (H) 11/30/2017 1110   LYMPHSABS 2.6 01/18/2018 1526   LYMPHSABS 2.0 11/30/2017 1110   MONOABS 0.8 01/18/2018 1526   EOSABS 0.6 01/18/2018 1526   EOSABS 0.6 (H) 11/30/2017 1110   BASOSABS 0.1 01/18/2018 1526   BASOSABS 0.0 11/30/2017 1110    CMP     Component Value Date/Time   NA 140 01/18/2018 1526   NA 141 11/30/2017 1110   K 5.9 (H)  01/18/2018 1526   CL 108 01/18/2018 1526   CO2 22 01/18/2018 1526   GLUCOSE 168 (H) 01/18/2018 1526   BUN 50 (H) 01/18/2018 1526   BUN 38 (H) 11/30/2017 1110   CREATININE 3.26 (H) 01/18/2018 1526   CALCIUM 9.2 01/18/2018 1526   PROT 7.3 01/18/2018 1526   PROT 6.6 07/24/2017 1135   ALBUMIN 3.5 01/18/2018 1526   ALBUMIN 3.3 (L) 07/24/2017 1135   AST 30 01/18/2018 1526   ALT 14 01/18/2018 1526   ALKPHOS 83 01/18/2018 1526   BILITOT 0.3 01/18/2018 1526   BILITOT <0.2 07/24/2017 1135   GFRNONAA 17 (L) 01/18/2018 1526   GFRAA 19 (L) 01/18/2018 1526    Lipid Panel     Component Value Date/Time   CHOL 130 01/18/2018 1526   CHOL 199 07/24/2017 1135   TRIG 218 (H) 01/18/2018 1526   HDL 41 01/18/2018 1526   HDL 63 07/24/2017 1135   CHOLHDL 3.2 01/18/2018 1526   VLDL 44 (H) 01/18/2018 1526   LDLCALC 45 01/18/2018 1526   LDLCALC 90 07/24/2017 1135     Imaging I have reviewed the images obtained:  CT-scan of the brain--as noted above there was a large area of low attenuation within the left cerebellar hemisphere most compatible with subacute infarct.  No compression of the ventricle.  There is also a small area of low-attenuation with a left frontal lobe again most compatible with age-indeterminate infarct.  I have seen the patient and reviewed the above note. He does speak some english and will follow simple commands. He does have some difficulty with coordination on  the left on my exam.   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 Possibilities include artery to artery, cardioembolic, less likely atherosclerotic. MRI to look for multifocal emboli(e.g. Is frontal lesion acute?) would be helpful.     Recommendations: Recommend # MRI of the brain without contrast #MRA Head and neck  #Transthoracic Echo,  #Continue patient on ASA 325mg  daily, #Start or continue Atorvastatin 80 mg/other high intensity statin # BP goal: permissive HTN upto 220/120 mmHg # HBAIC and Lipid profile # Telemetry monitoring # Frequent neuro checks # NPO until passes stroke swallow screen # please page stroke NP  Or  PA  Or MD from 8am -4 pm  as this patient from this time will be  followed by the stroke.   You can look them up on [email or web address]  Password [email or web address]   Roland Rack, MD [facility] [phone]  If 7pm- 7am, please page neurology on call as listed in [place].

## 2018-01-18 NOTE — ED Notes (Signed)
Patient back from MRI.

## 2018-01-18 NOTE — ED Notes (Signed)
Attempted report x1. Left call back number with CN and Network engineer.

## 2018-01-18 NOTE — H&P (Signed)
History and Physical   Timothy [name] ZOX:[id number] DOB: 1941/04/01 DOA: 01/18/2018  Referring MD/NP/PA: Dr. Marye Round  PCP: [name], PA-C   Outpatient Specialists: None  Patient coming from: Nursing home  Chief Complaint: Altered mental state  HPI: Timothy [name] is a 77 y.o. male with medical history significant of alcohol abuse, hypertension, diabetes, previous poor vision who is a resident of local nursing facility that was found to be having significant altered mental status during lunch today.  He was fine before then.  EMS was called.  Patient was found to be confused and not at baseline.  Initial thought process was patient may have had acute coronary syndrome due to abnormal EKG on the way to the hospital.  EMS activated STEMI in the field initially and started him on aspirin and heparin.  Patient arrived to the hospital and was evaluated with no evidence of MI.  He had work-up for altered mental status including head CT without contrast that showed evidence of subacute CVA.  Subsequent MRI of the brain showed acute left cerebral CVA.  Patient is being admitted therefore for work-up..  ED Course: Patient's vitals were mostly stable except for blood pressure 170/71.  Urinalysis is negative.  Potassium was 5.9 with glucose 168.  His BUN is 50 and creatinine 3.26.  Previous creatinine baseline was 1.9.  Troponin is negative.  Fasting lipid panel showed triglyceride of 218 otherwise within normal limits.  His white count is elevated 12.9 with hemoglobin 10.2.  PT/INR is within normal glucose 168.  His CT without contrast showed large area of low attenuation within the left cerebellar hemisphere probably subacute infarct.  Another area in the left frontal lobe probably age indeterminate infarct.  Subsequent MRI of the brain showed acute infarct along the cerebral convexity in the left corona radiata.  Also remote left PICA infarct remote external capsule hemorrhage on the right.  Patient was  seen and evaluated by neurology in the ER and is being admitted to complete work-up.  Review of Systems: As per HPI otherwise 10 point review of systems negative.    Past Medical History:  Diagnosis Date  . Alcohol abuse    Patient denies, but was shared with people from [name] by his male roommate.  . Bilateral cataracts 04/08/2017  . DM (diabetes mellitus), type 2 with renal complications ([place])    uncertain when diagnosed  . HOH (hard of hearing)   . Hypertension   . Legally blind    B/L  . Poor vision     Past Surgical History:  Procedure Laterality Date  . MEMBRANE PEEL Right 09/29/2017   Procedure: MEMBRANE PEEL WITH AIR GAS;  Surgeon: Jalene Mullet, MD;  Location: [place];  Service: Ophthalmology;  Laterality: Right;  . PARS PLANA VITRECTOMY Left 06/29/2017   Procedure: PARS PLANA VITRECTOMY WITH 25 GAUGE AND MEMBRANE PEEL WITH GAS EXCHANGE, AIR SILICONE OIL AND PHACO COAGULATION, LASER;  Surgeon: Jalene Mullet, MD;  Location: [place];  Service: Ophthalmology;  Laterality: Left;  . PARS PLANA VITRECTOMY Right 09/18/2017   Procedure: PARS PLANA VITRECTOMY WITH 25 GAUGE MEMBRANE PILL WITH AIR GAS SILICONE OIL AND PHOTOCOAGULATION;  Surgeon: Jalene Mullet, MD;  Location: [place];  Service: Ophthalmology;  Laterality: Right;  . PARS PLANA VITRECTOMY Right 09/29/2017   Procedure: PARS PLANA VITRECTOMY WITH 25 GAUGE;  Surgeon: Jalene Mullet, MD;  Location: [place];  Service: Ophthalmology;  Laterality: Right;  . PHOTOCOAGULATION Right 09/29/2017   Procedure: PHOTOCOAGULATION;  Surgeon:  Jalene Mullet, MD;  Location: [place];  Service: Ophthalmology;  Laterality: Right;  . 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 His smoking use included cigarettes. He has never  used smokeless tobacco. He reports that he drinks alcohol. He reports that he does not use drugs.  No Known Allergies  Family History  Problem Relation Age of Onset  . Lung disease Neg Hx      Prior to Admission medications   Medication Sig Start Date End Date Taking? Authorizing Provider  albuterol (PROVENTIL HFA;VENTOLIN HFA) 108 (90 Base) MCG/ACT inhaler Inhale 2 puffs into the lungs every 6 (six) hours as needed for wheezing or shortness of breath. 08/24/17   [name], PA-C  amLODipine (NORVASC) 10 MG tablet Take 1 tablet (10 mg total) by mouth daily. 08/24/17   [name], PA-C  aspirin 81 MG chewable tablet Chew 1 tablet (81 mg total) by mouth daily. 08/24/17   [name], PA-C  atorvastatin (LIPITOR) 20 MG tablet Take 1 tablet (20 mg total) by mouth daily. 11/30/17   [name], PA-C  carvedilol (COREG) 6.25 MG tablet Take 1 tablet (6.25 mg total) by mouth 2 (two) times daily with a meal. 08/24/17   [name], PA-C  ferrous gluconate [facility]) 324 MG tablet Take 1 tablet (324 mg total) by mouth daily with breakfast. 08/24/17   [name], PA-C  folic acid (FOLVITE) 1 MG tablet Take 1 tablet (1 mg total) by mouth daily. 08/24/17   [name], PA-C  hydrALAZINE (APRESOLINE) 10 MG tablet Take 1 tablet (10 mg total) by mouth 3 (three) times daily. 10/27/17   [name], PA-C  isosorbide mononitrate (IMDUR) 30 MG 24 hr tablet Take 1 tablet (30 mg total) by mouth daily. 11/30/17   [name], PA-C  omega-3 acid ethyl esters (LOVAZA) 1 g capsule Take 2 capsules (2 g total) by mouth 2 (two) times daily. 11/30/17 02/28/18  [name], PA-C  oxyCODONE (ROXICODONE) 5 MG immediate release tablet Take 1 tablet (5 mg total) by mouth every 6 (six) hours as needed for severe pain. 12/17/17   Mesner, Corene Cornea, MD  Respiratory Therapy Supplies (FLUTTER) DEVI 1 Device by Does not apply route as directed. 01/05/18   June Leap L, DO  sodium  bicarbonate 650 MG tablet Take 1 tablet (650 mg total) by mouth 2 (two) times daily. 12/06/17   Regalado, Belkys A, MD  tamsulosin (FLOMAX) 0.4 MG CAPS capsule Take 1 capsule (0.4 mg total) by mouth daily. 12/07/17   Regalado, Belkys A, MD  thiamine (VITAMIN B-1) 100 MG tablet Take 100 mg by mouth daily.    [provider]    Physical Exam: Vitals:   01/18/18 1800 01/18/18 1830 01/18/18 1900 01/18/18 2038  BP: (!) 170/71 (!) 158/74 129/74 (!) 169/73  Pulse: 81 80 84 84  Resp: 17 18 17 17   Temp:    98.3 F (36.8 C)  TempSrc:    Oral  SpO2: 100% 100% 99% 100%  Weight:    46.8 kg  Height:          Constitutional: NAD, calm, comfortable Vitals:   01/18/18 1800 01/18/18 1830 01/18/18 1900 01/18/18 2038  BP: (!) 170/71 (!) 158/74 129/74 Marland Kitchen)  169/73  Pulse: 81 80 84 84  Resp: 17 18 17 17   Temp:    98.3 F (36.8 C)  TempSrc:    Oral  SpO2: 100% 100% 99% 100%  Weight:    46.8 kg  Height:        Patient is awake but poor communication. Eyes: PERRL, lids and conjunctivae normal ENMT: Mucous membranes are moist. Posterior pharynx clear of any exudate or lesions.Normal dentition.  Neck: normal, supple, no masses, no thyromegaly Respiratory: clear to auscultation bilaterally, no wheezing, no crackles. Normal respiratory effort. No accessory muscle use.  Cardiovascular: Regular rate and rhythm, no murmurs / rubs / gallops. No extremity edema. 2+ pedal pulses. No carotid bruits.  Abdomen: no tenderness, no masses palpated. No hepatosplenomegaly. Bowel sounds positive.  Musculoskeletal: no clubbing / cyanosis. No joint deformity upper and lower extremities. Good ROM, no contractures. Normal muscle tone.  Skin: no rashes, lesions, ulcers. No induration Neurologic: Patient has right hemianopsia otherwise CN 2-12 grossly intact. Sensation intact, DTR normal. Strength 5/5 in all 4.  Psychiatric: Normal judgment and insight. Alert and oriented x 3. Normal mood.     Labs on Admission:  I have personally reviewed following labs and imaging studies  CBC: Recent Labs  Lab 01/18/18 1526  WBC 12.9*  NEUTROABS 8.9*  HGB 10.2*  HCT 33.7*  MCV 80.8  PLT 277   Basic Metabolic Panel: Recent Labs  Lab 01/18/18 1526  NA 140  K 5.9*  CL 108  CO2 22  GLUCOSE 168*  BUN 50*  CREATININE 3.26*  CALCIUM 9.2   GFR: Estimated Creatinine Clearance: 12.2 mL/min (A) (by C-G formula based on SCr of 3.26 mg/dL (H)). Liver Function Tests: Recent Labs  Lab 01/18/18 1526  AST 30  ALT 14  ALKPHOS 83  BILITOT 0.3  PROT 7.3  ALBUMIN 3.5   No results for input(s): LIPASE, AMYLASE in the last 168 hours. No results for input(s): AMMONIA in the last 168 hours. Coagulation Profile: Recent Labs  Lab 01/18/18 1526  INR 0.96   Cardiac Enzymes: Recent Labs  Lab 01/18/18 1526  TROPONINI <0.03   BNP (last 3 results) No results for input(s): PROBNP in the last 8760 hours. HbA1C: No results for input(s): HGBA1C in the last 72 hours. CBG: No results for input(s): GLUCAP in the last 168 hours. Lipid Profile: Recent Labs    01/18/18 1526  CHOL 130  HDL 41  LDLCALC 45  TRIG 218*  CHOLHDL 3.2   Thyroid Function Tests: No results for input(s): TSH, T4TOTAL, FREET4, T3FREE, THYROIDAB in the last 72 hours. Anemia Panel: No results for input(s): VITAMINB12, FOLATE, FERRITIN, TIBC, IRON, RETICCTPCT in the last 72 hours. Urine analysis:    Component Value Date/Time   COLORURINE YELLOW 01/18/2018 1817   APPEARANCEUR HAZY (A) 01/18/2018 1817   LABSPEC 1.013 01/18/2018 1817   PHURINE 5.0 01/18/2018 1817   GLUCOSEU 50 (A) 01/18/2018 1817   HGBUR NEGATIVE 01/18/2018 1817   BILIRUBINUR NEGATIVE 01/18/2018 1817   KETONESUR NEGATIVE 01/18/2018 1817   PROTEINUR >=300 (A) 01/18/2018 1817   UROBILINOGEN 0.2 11/15/2013 0231   NITRITE NEGATIVE 01/18/2018 1817   LEUKOCYTESUR NEGATIVE 01/18/2018 1817   Sepsis Labs: @LABRCNTIP (procalcitonin:4,lacticidven:4) )No results found for  this or any previous visit (from the past 240 hour(s)).   Radiological Exams on Admission: Ct Head Wo Contrast  Result Date: 01/18/2018 CLINICAL DATA:  Patient with altered mental status. EXAM: CT HEAD WITHOUT CONTRAST TECHNIQUE: Contiguous axial images were obtained from the  base of the skull through the vertex without intravenous contrast. COMPARISON:  Brain CT 11/15/2013. FINDINGS: Brain: Ventricles and sulci are prominent compatible with atrophy. Chronic bilateral thalamic lacunar infarcts. Patchy low attenuation within the left frontal lobe (image 22; series 3). Large area of low attenuation within the left cerebellar hemisphere (image 8; series 3). No mass lesion or mass-effect. Vascular: Unremarkable Skull: Intact. Sinuses/Orbits: Left maxillary sinus mucosal thickening. Mastoid air cells are unremarkable. Postprocedural changes within the orbits. Other: None. IMPRESSION: Large area of low attenuation within the left cerebellar hemisphere most compatible with subacute infarct. Small area of low attenuation within the left frontal lobe most compatible with age-indeterminate infarct. These results were called by telephone at the time of interpretation on 01/18/2018 at 4:24 pm to Dr. Dorie Rank , who verbally acknowledged these results. Electronically Signed   By: Lovey Newcomer M.D.   On: 01/18/2018 16:26   Mr [name] Head Wo Contrast  Result Date: 01/18/2018 CLINICAL DATA:  Altered mental status. EXAM: MRI HEAD WITHOUT CONTRAST MRA HEAD WITHOUT CONTRAST TECHNIQUE: Multiplanar, multiecho pulse sequences of the brain and surrounding structures were obtained without intravenous contrast. Angiographic images of the head were obtained using MRA technique without contrast. COMPARISON:  Head CT earlier today FINDINGS: MRI HEAD FINDINGS Brain: Left inferior cerebellar infarct is remote. There are patchy acute infarcts that are overall small in size, seen along the bilateral cerebral convexities and at the left corona  radiata. The most confluent cortically based infarcts measured 2.4 cm in the right occipital lobe and 2.8 cm in the anterior left frontal lobe. Remote lacunar infarcts in the bilateral thalamus, right caudate head, and pons. Remote hemorrhage in the external capsule on the right with hemosiderin staining. There is moderate ischemic gliosis in the periventricular white matter. No acute hemorrhage, hydrocephalus, or masslike finding. Vascular: Arterial findings below. Preserved dural venous sinus flow voids. Skull and upper cervical spine: No evidence of marrow lesion Sinuses/Orbits: Postoperative globes. Mild mucosal thickening in the left maxillary sinus. MRA HEAD FINDINGS Motion degraded. The carotid, vertebral, and basilar arteries are patent. Probable generalized atheromatous undulation of vessels. No branch occlusion or flow limiting stenosis suspected. No suspected aneurysm. IMPRESSION: 1. Patchy acute infarct along the cerebral convexities and left corona radiata. These are seen in the anterior and posterior circulation and suggest central embolic disease. 2. Remote left PICA infarct. Multiple remote lacunar infarcts. 3. Remote external capsule hemorrhage on the right. 4. Motion degraded intracranial MRA without acute finding. Electronically Signed   By: Monte Fantasia M.D.   On: 01/18/2018 20:05   Mr Brain Wo Contrast  Result Date: 01/18/2018 CLINICAL DATA:  Altered mental status. EXAM: MRI HEAD WITHOUT CONTRAST MRA HEAD WITHOUT CONTRAST TECHNIQUE: Multiplanar, multiecho pulse sequences of the brain and surrounding structures were obtained without intravenous contrast. Angiographic images of the head were obtained using MRA technique without contrast. COMPARISON:  Head CT earlier today FINDINGS: MRI HEAD FINDINGS Brain: Left inferior cerebellar infarct is remote. There are patchy acute infarcts that are overall small in size, seen along the bilateral cerebral convexities and at the left corona radiata.  The most confluent cortically based infarcts measured 2.4 cm in the right occipital lobe and 2.8 cm in the anterior left frontal lobe. Remote lacunar infarcts in the bilateral thalamus, right caudate head, and pons. Remote hemorrhage in the external capsule on the right with hemosiderin staining. There is moderate ischemic gliosis in the periventricular white matter. No acute hemorrhage, hydrocephalus, or masslike finding. Vascular: Arterial  findings below. Preserved dural venous sinus flow voids. Skull and upper cervical spine: No evidence of marrow lesion Sinuses/Orbits: Postoperative globes. Mild mucosal thickening in the left maxillary sinus. MRA HEAD FINDINGS Motion degraded. The carotid, vertebral, and basilar arteries are patent. Probable generalized atheromatous undulation of vessels. No branch occlusion or flow limiting stenosis suspected. No suspected aneurysm. IMPRESSION: 1. Patchy acute infarct along the cerebral convexities and left corona radiata. These are seen in the anterior and posterior circulation and suggest central embolic disease. 2. Remote left PICA infarct. Multiple remote lacunar infarcts. 3. Remote external capsule hemorrhage on the right. 4. Motion degraded intracranial MRA without acute finding. Electronically Signed   By: Monte Fantasia M.D.   On: 01/18/2018 20:05   Dg Chest Portable 1 View  Result Date: 01/18/2018 CLINICAL DATA:  Altered mental status EXAM: PORTABLE CHEST 1 VIEW COMPARISON:  Chest radiograph 12/04/2017 FINDINGS: Monitoring leads overlie the patient. Stable cardiac and mediastinal contours. Patchy consolidation left lower hemithorax. Small left pleural effusion. No pneumothorax. IMPRESSION: Patchy consolidation left lower hemithorax may be secondary to known bronchiectasis, superimposed infection not excluded in the appropriate clinical setting. Small left effusion. Electronically Signed   By: Lovey Newcomer M.D.   On: 01/18/2018 15:52    EKG: Independently  reviewed.  Showed normal sinus rhythm.  Normal intervals.  No significant ST changes  Assessment/Plan Principal Problem:   CVA (cerebral vascular accident) ([place]) Active Problems:   DM (diabetes mellitus), type 2 with renal complications ([facility])   Alcohol abuse   Hyperkalemia   Acute renal failure superimposed on chronic kidney disease ([facility])   Altered mental status     #1 acute left cerebral hemispheric CVA: Patient will be admitted and continue work-up.  We will get echocardiogram and carotid Dopplers.  He is currently on aspirin and statin.  N.p.o. for now until patient is cleared.  Neurology is following patient and will continue help with management.  Patient complained of right-sided blindness especially the right eye.  Appears to have right hemi-anopsia.  #2 diabetes: Patient will be on sliding scale insulin as well as home regimen of blood sugar control.  Check hemoglobin A1c  #3 hyperkalemia: Recheck potassium level and if it is elevated will use Kayexalate.  #4 acute on chronic kidney failure: Probably prerenal.  Patient will give gentle hydration and monitor his renal function.  #5 hypertension: With acute stroke we will have permissive hypertension.  Hold antihypertensives at this point.  #6 hard of hearing: Patient has chronic difficulty with hearing.   DVT prophylaxis: Lovenox Code Status: Full code Family Communication: Patient has no family in the area.  He has guardianship. Disposition Plan: Back to nursing home Consults called: Neurology Dr. Katherine Roan Admission status: Inpatient  Severity of Illness: The appropriate patient status for this patient is INPATIENT. Inpatient status is judged to be reasonable and necessary in order to provide the required intensity of service to ensure the patient's safety. The patient's presenting symptoms, physical exam findings, and initial radiographic and laboratory data in the context of their chronic comorbidities is felt to place  them at high risk for further clinical deterioration. Furthermore, it is not anticipated that the patient will be medically stable for discharge from the hospital within 2 midnights of admission. The following factors support the patient status of inpatient.   " The patient's presenting symptoms include altered mental status. " The worrisome physical exam findings include right hemianopsia. " The initial radiographic and laboratory data are worrisome because of CT  and MRI evidence of acute stroke. " The chronic co-morbidities include history of diabetes with hypertension and alcohol abuse.   * I certify that at the point of admission it is my clinical judgment that the patient will require inpatient hospital care spanning beyond 2 midnights from the point of admission due to high intensity of service, high risk for further deterioration and high frequency of surveillance required.Barbette Merino MD [facility] Pager [phone]  If 7PM-7AM, please contact night-coverage [email or web address] Password [facility]  01/18/2018, 8:41 PM

## 2018-01-18 NOTE — ED Triage Notes (Addendum)
Patient arrived via ems from [facility], they reported to ems that "something wasn't right" however, they did not know what his baseline neuro status was. Patient reacts to pain but is non verbal upon arrival, patient also does not speak english but speaks [place]. Code STEMI called in the field by ems.

## 2018-01-18 NOTE — ED Notes (Signed)
Patient transported to MRI 

## 2018-01-18 NOTE — ED Provider Notes (Signed)
[facility] EMERGENCY DEPARTMENT Provider Note   CSN: [id number] Arrival date & time: 01/18/18  1511   5 caveat: Language barrier associated with altered mental status.  History   Chief Complaint Chief Complaint  Patient presents with  . Altered Mental Status    HPI Timothy [name] is a 77 y.o. male.  HPI Patient presents to the emergency room for evaluation of altered mental status and an abnormal EKG.  Patient is a resident of a nursing facility.  They went to give the patient lunch today and he was apparently not acting normally.  EMS reports that the nursing home was not able to clearly tell him what his normal baseline was and when this change in behavior occurred.  The patient is unable to provide [name] any history.  We attempted to use the video translator service but the patient would not answer any questions.  During his transport EMS performed an EKG.  They noted some anterior abnormalities and were concerned for possible STEMI.  EMS activated STEMI in the field.  Patient was given an aspirin and heparin by nursing staff per stemi protocol. Past Medical History:  Diagnosis Date  . Alcohol abuse    Patient denies, but was shared with people from [facility] by his male roommate.  . Bilateral cataracts 04/08/2017  . DM (diabetes mellitus), type 2 with renal complications ([place])    uncertain when diagnosed  . HOH (hard of hearing)   . Hypertension   . Legally blind    B/L  . Poor vision     Patient Active Problem List   Diagnosis Date Noted  . Leukocytosis 12/02/2017  . Acute renal failure superimposed on chronic kidney disease ([place]) 12/01/2017  . ARF (acute renal failure) ([place]) 12/01/2017  . 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 PNA (pneumonia) 07/09/2017  . HCAP (healthcare-associated pneumonia)   . Hypertension 06/26/2017  . Hypertensive emergency 06/25/2017  . Acute kidney injury superimposed on chronic kidney disease ([place]) 06/25/2017  . Hyperkalemia 06/25/2017  .  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 Left retinal detachment   . 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 Bilateral cataracts 04/08/2017  . DM (diabetes mellitus), type 2 with renal complications ([place])   . Alcohol abuse   . Poor vision   . Chronic kidney disease 02/13/2017  . Anemia of chronic disease 02/13/2017  . 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 Epigastric pain 02/11/2017  . Fall at home 11/16/2013  . Closed TBI (traumatic brain injury) ([place]) 11/16/2013  . SDH (subdural hematoma) ([place]) 11/15/2013    Past Surgical History:  Procedure Laterality Date  . MEMBRANE PEEL Right 09/29/2017   Procedure: MEMBRANE PEEL WITH AIR GAS;  Surgeon: Jalene Mullet, MD;  Location: [place] [place];  Service: Ophthalmology;  Laterality: Right;  . PARS PLANA VITRECTOMY Left 06/29/2017   Procedure: PARS PLANA VITRECTOMY WITH 25 GAUGE AND MEMBRANE PEEL WITH GAS EXCHANGE, AIR SILICONE OIL AND PHACO COAGULATION, LASER;  Surgeon: Jalene Mullet, MD;  Location: [place];  Service: Ophthalmology;  Laterality: Left;  . PARS PLANA VITRECTOMY Right 09/18/2017   Procedure: PARS PLANA VITRECTOMY WITH 25 GAUGE MEMBRANE PILL WITH AIR GAS SILICONE OIL AND PHOTOCOAGULATION;  Surgeon: Jalene Mullet, MD;  Location: [facility];  Service: Ophthalmology;  Laterality: Right;  . PARS PLANA VITRECTOMY Right 09/29/2017   Procedure: PARS PLANA VITRECTOMY WITH 25 GAUGE;  Surgeon: Jalene Mullet, MD;  Location: [place];  Service: Ophthalmology;  Laterality: Right;  . PHOTOCOAGULATION Right 09/29/2017  Procedure: PHOTOCOAGULATION;  Surgeon: Jalene Mullet, MD;  Location: [facility];  Service: Ophthalmology;  Laterality: Right;  . REPAIR OF COMPLEX TRACTION RETINAL DETACHMENT Right 09/29/2017   Procedure: REPAIR OF COMPLEX TRACTION RETINAL DETACHMENT;  Surgeon: Jalene Mullet, MD;  Location: [facility];  Service: Ophthalmology;  Laterality: Right;        Home Medications    Prior to Admission medications   Medication Sig  Start Date End Date Taking? Authorizing Provider  albuterol (PROVENTIL HFA;VENTOLIN HFA) 108 (90 Base) MCG/ACT inhaler Inhale 2 puffs into the lungs every 6 (six) hours as needed for wheezing or shortness of breath. 08/24/17   [name], PA-C  amLODipine (NORVASC) 10 MG tablet Take 1 tablet (10 mg total) by mouth daily. 08/24/17   [name], PA-C  aspirin 81 MG chewable tablet Chew 1 tablet (81 mg total) by mouth daily. 08/24/17   [name], PA-C  atorvastatin (LIPITOR) 20 MG tablet Take 1 tablet (20 mg total) by mouth daily. 11/30/17   [name], PA-C  carvedilol (COREG) 6.25 MG tablet Take 1 tablet (6.25 mg total) by mouth 2 (two) times daily with a meal. 08/24/17   [name], PA-C  ferrous gluconate [facility]) 324 MG tablet Take 1 tablet (324 mg total) by mouth daily with breakfast. 08/24/17   [name], PA-C  folic acid (FOLVITE) 1 MG tablet Take 1 tablet (1 mg total) by mouth daily. 08/24/17   [name], PA-C  hydrALAZINE (APRESOLINE) 10 MG tablet Take 1 tablet (10 mg total) by mouth 3 (three) times daily. 10/27/17   [name], PA-C  isosorbide mononitrate (IMDUR) 30 MG 24 hr tablet Take 1 tablet (30 mg total) by mouth daily. 11/30/17   [name], PA-C  omega-3 acid ethyl esters (LOVAZA) 1 g capsule Take 2 capsules (2 g total) by mouth 2 (two) times daily. 11/30/17 02/28/18  [name], PA-C  oxyCODONE (ROXICODONE) 5 MG immediate release tablet Take 1 tablet (5 mg total) by mouth every 6 (six) hours as needed for severe pain. 12/17/17   Mesner, Corene Cornea, MD  Respiratory Therapy Supplies (FLUTTER) DEVI 1 Device by Does not apply route as directed. 01/05/18   June Leap L, DO  sodium bicarbonate 650 MG tablet Take 1 tablet (650 mg total) by mouth 2 (two) times daily. 12/06/17   Regalado, Belkys A, MD  tamsulosin (FLOMAX) 0.4 MG CAPS capsule Take 1 capsule (0.4 mg total) by mouth daily. 12/07/17   Regalado, Belkys A, MD  thiamine  (VITAMIN B-1) 100 MG tablet Take 100 mg by mouth daily.    [provider]    Family History Family History  Problem Relation Age of Onset  . Lung disease Neg Hx     Social History Social History   Tobacco Use  . Smoking status: Former Smoker    Types: Cigarettes    Last attempt to quit: 05/19/1990    Years since quitting: 27.6  . Smokeless tobacco: Never Used  Substance Use Topics  . 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 Drug use: No     Allergies   Patient has no known allergies.   Review of Systems Review of Systems  All other systems reviewed and are negative.    Physical Exam Updated Vital Signs BP (!) 157/64   Pulse 72   Resp 19   Ht 1.575  m (5\' 2" )   Wt 46.9 kg   SpO2 100%   BMI 18.91 kg/m   Physical Exam  Constitutional: No distress.  Elderly, frail  HENT:  Head: Normocephalic and atraumatic.  Right Ear: External ear normal.  Left Ear: External ear normal.  mucous membranes dry  Eyes: Conjunctivae are normal. Right eye exhibits no discharge. Left eye exhibits no discharge. No scleral icterus.  Neck: Neck supple. No tracheal deviation present.  Cardiovascular: Normal rate, regular rhythm and intact distal pulses.  Pulmonary/Chest: Effort normal and breath sounds normal. No stridor. No respiratory distress. He has no wheezes. He has no rales.  Abdominal: Soft. Bowel sounds are normal. He exhibits no distension. There is no tenderness. There is no rebound and no guarding.  Musculoskeletal: He exhibits no edema or tenderness.  Neurological: He is alert. He is disoriented. No cranial nerve deficit (Questionable left facial droop, unable to determine if the patient is having any slurred speech) or sensory deficit ( Patient appears to respond to painful stimuli in all extremities). He exhibits normal muscle tone. He displays no seizure activity. GCS eye subscore is 4. GCS  verbal subscore is 3. GCS motor subscore is 5.  Patient will not follow commands and move his extremities when asked but he does withdraw in all 4 extremities, he is not answering questions even with use of a translator  Skin: Skin is warm and dry. No rash noted.  Psychiatric: He has a normal mood and affect.  Nursing note and vitals reviewed.    ED Treatments / Results  Labs (all labs ordered are listed, but only abnormal results are displayed) Labs Reviewed  CBC WITH DIFFERENTIAL/PLATELET - Abnormal; Notable for the following components:      Result Value   WBC 12.9 (*)    RBC 4.17 (*)    Hemoglobin 10.2 (*)    HCT 33.7 (*)    MCH 24.5 (*)    Neutro Abs 8.9 (*)    All other components within normal limits  COMPREHENSIVE METABOLIC PANEL - Abnormal; Notable for the following components:   Potassium 5.9 (*)    Glucose, Bld 168 (*)    BUN 50 (*)    Creatinine, Ser 3.26 (*)    GFR calc non Af Amer 17 (*)    GFR calc Af Amer 19 (*)    All other components within normal limits  LIPID PANEL - Abnormal; Notable for the following components:   Triglycerides 218 (*)    VLDL 44 (*)    All other components within normal limits  PROTIME-INR  APTT  TROPONIN I  ETHANOL  RAPID URINE DRUG SCREEN, HOSP PERFORMED  URINALYSIS, ROUTINE W REFLEX MICROSCOPIC  I-STAT TROPONIN, ED    EKG EKG Interpretation  Date/Time:  Monday January 18 2018 15:20:23 EDT Ventricular Rate:  64 PR Interval:    QRS Duration: 102 QT Interval:  416 QTC Calculation: 430 R Axis:   82 Text Interpretation:  Sinus rhythm Borderline right axis deviation Minimal ST elevation, anterior leads , appears decreased from prior Baseline wander in lead(s) V2 V3 V4 Confirmed by Dorie Rank [phone]) on 01/18/2018 3:26:04 PM   Radiology Ct Head Wo Contrast  Result Date: 01/18/2018 CLINICAL DATA:  Patient with altered mental status. EXAM: CT HEAD WITHOUT CONTRAST TECHNIQUE: Contiguous axial images were obtained from the base  of the skull through the vertex without intravenous contrast. COMPARISON:  Brain CT 11/15/2013. FINDINGS: Brain: Ventricles and sulci are prominent compatible with atrophy. Chronic bilateral  thalamic lacunar infarcts. Patchy low attenuation within the left frontal lobe (image 22; series 3). Large area of low attenuation within the left cerebellar hemisphere (image 8; series 3). No mass lesion or mass-effect. Vascular: Unremarkable Skull: Intact. Sinuses/Orbits: Left maxillary sinus mucosal thickening. Mastoid air cells are unremarkable. Postprocedural changes within the orbits. Other: None. IMPRESSION: Large area of low attenuation within the left cerebellar hemisphere most compatible with subacute infarct. Small area of low attenuation within the left frontal lobe most compatible with age-indeterminate infarct. These results were called by telephone at the time of interpretation on 01/18/2018 at 4:24 pm to Dr. Dorie Rank , who verbally acknowledged these results. Electronically Signed   By: Lovey Newcomer M.D.   On: 01/18/2018 16:26   Dg Chest Portable 1 View  Result Date: 01/18/2018 CLINICAL DATA:  Altered mental status EXAM: PORTABLE CHEST 1 VIEW COMPARISON:  Chest radiograph 12/04/2017 FINDINGS: Monitoring leads overlie the patient. Stable cardiac and mediastinal contours. Patchy consolidation left lower hemithorax. Small left pleural effusion. No pneumothorax. IMPRESSION: Patchy consolidation left lower hemithorax may be secondary to known bronchiectasis, superimposed infection not excluded in the appropriate clinical setting. Small left effusion. Electronically Signed   By: Lovey Newcomer M.D.   On: 01/18/2018 15:52    Procedures .Critical Care Performed by: Dorie Rank, MD Authorized by: Dorie Rank, MD   Critical care provider statement:    Critical care time (minutes):  30   Critical care was time spent personally by me on the following activities:  Discussions with consultants, evaluation of patient's  response to treatment, examination of patient, ordering and performing treatments and interventions, ordering and review of laboratory studies, ordering and review of radiographic studies, pulse oximetry, re-evaluation of patient's condition, obtaining history from patient or surrogate and review of old charts   (including critical care time)  Medications Ordered in ED Medications  0.9 %  sodium chloride infusion ( Intravenous Stopped 01/18/18 1603)  aspirin chewable tablet 324 mg (0 mg Oral Hold 01/18/18 1550)  0.9 %  sodium chloride infusion ( Intravenous New Bag/Given 01/18/18 1543)  sodium chloride 0.9 % bolus 500 mL (has no administration in time range)  heparin injection 2,800 Units (2,800 Units Intravenous Given 01/18/18 1533)     Initial Impression / Assessment and Plan / ED Course  I have reviewed the triage vital signs and the nursing notes.  Pertinent labs & imaging results that were available during my care of the patient were reviewed by me and considered in my medical decision making (see chart for details).  Clinical Course as of Jan 19 1711  Mon Jan 18, 2018  1552 EKG reviewed by Dr Gwenlyn Found.  Code stemi cancelled   [JK]  1624 CT scan shows left subacute cerebellar infarct   [JK]  1708 Anemia is stable  CBC with Differential/Platelet(!) [JK]  1708 Worsening renal insufficiency with mild hyperkalemia.  Will hydrate  Comprehensive metabolic panel(!) [JK]    Clinical Course User Index [JK] Dorie Rank, MD    Pt presented to the ED with altered mental status.   EMS activated a code stemi.  Pt was given heparin per protocol.  My review of the EKG did not correlate with a STEMI.  Code stemi cancelled by cardiology.  CT scan suggests subacute stroke.  Neurology consulted.  No acute intevention indicated.  Onset unclear. Will consult with medical service for admission  Final Clinical Impressions(s) / ED Diagnoses   Final diagnoses:  Cerebrovascular accident (CVA), unspecified  mechanism ([place])  Dorie Rank, MD 01/18/18 [phone]

## 2018-01-19 ENCOUNTER — Inpatient Hospital Stay (HOSPITAL_COMMUNITY): Payer: Medicare Other

## 2018-01-19 ENCOUNTER — Inpatient Hospital Stay: Admission: RE | Admit: 2018-01-19 | Payer: Medicare Other | Source: Ambulatory Visit

## 2018-01-19 ENCOUNTER — Telehealth: Payer: Self-pay | Admitting: Internal Medicine

## 2018-01-19 ENCOUNTER — Other Ambulatory Visit (HOSPITAL_COMMUNITY): Payer: Medicare Other

## 2018-01-19 ENCOUNTER — Telehealth: Payer: Self-pay | Admitting: Pulmonary Disease

## 2018-01-19 DIAGNOSIS — F1010 Alcohol abuse, uncomplicated: Secondary | ICD-10-CM

## 2018-01-19 DIAGNOSIS — I6300 Cerebral infarction due to thrombosis of unspecified precerebral artery: Secondary | ICD-10-CM

## 2018-01-19 LAB — COMPREHENSIVE METABOLIC PANEL
ALBUMIN: 3.4 g/dL — AB (ref 3.5–5.0)
ALK PHOS: 78 U/L (ref 38–126)
ALT: 11 U/L (ref 0–44)
AST: 21 U/L (ref 15–41)
Anion gap: 7 (ref 5–15)
BILIRUBIN TOTAL: 0.8 mg/dL (ref 0.3–1.2)
BUN: 40 mg/dL — AB (ref 8–23)
CO2: 21 mmol/L — ABNORMAL LOW (ref 22–32)
Calcium: 9 mg/dL (ref 8.9–10.3)
Chloride: 116 mmol/L — ABNORMAL HIGH (ref 98–111)
Creatinine, Ser: 2.38 mg/dL — ABNORMAL HIGH (ref 0.61–1.24)
GFR calc Af Amer: 28 mL/min — ABNORMAL LOW (ref >60)
GFR calc non Af Amer: 24 mL/min — ABNORMAL LOW (ref >60)
GLUCOSE: 121 mg/dL — AB (ref 70–99)
POTASSIUM: 4.3 mmol/L (ref 3.5–5.1)
Sodium: 144 mmol/L (ref 135–145)
TOTAL PROTEIN: 6.8 g/dL (ref 6.5–8.1)

## 2018-01-19 LAB — CBC
HEMATOCRIT: 32.1 % — AB (ref 39.0–52.0)
HEMOGLOBIN: 9.9 g/dL — AB (ref 13.0–17.0)
MCH: 24.6 pg — ABNORMAL LOW (ref 26.0–34.0)
MCHC: 30.8 g/dL (ref 30.0–36.0)
MCV: 79.7 fL (ref 78.0–100.0)
Platelets: 252 10*3/uL (ref 150–400)
RBC: 4.03 MIL/uL — AB (ref 4.22–5.81)
RDW: 12.7 % (ref 11.5–15.5)
WBC: 13.9 10*3/uL — AB (ref 4.0–10.5)

## 2018-01-19 LAB — LIPID PANEL
Cholesterol: 124 mg/dL (ref 0–200)
HDL: 45 mg/dL (ref >40)
LDL Cholesterol: 58 mg/dL (ref 0–99)
TRIGLYCERIDES: 104 mg/dL (ref <150)
Total CHOL/HDL Ratio: 2.8 RATIO
VLDL: 21 mg/dL (ref 0–40)

## 2018-01-19 LAB — HEMOGLOBIN A1C
Hgb A1c MFr Bld: 7.1 % — ABNORMAL HIGH (ref 4.8–5.6)
MEAN PLASMA GLUCOSE: 157.07 mg/dL

## 2018-01-19 MED ORDER — HYDRALAZINE HCL 20 MG/ML IJ SOLN: 10.0000 mg | Freq: Once | INTRAMUSCULAR | Status: DC

## 2018-01-19 MED ORDER — SODIUM BICARBONATE 650 MG PO TABS
650.0000 mg | ORAL_TABLET | Freq: Two times a day (BID) | ORAL | Status: DC
  Administered 2018-01-19 – 2018-01-22 (×6): 650 mg via ORAL
  Filled 2018-01-19 (×6): qty 1

## 2018-01-19 MED ORDER — DIFLUPREDNATE 0.05 % OP EMUL: 1.0000 [drp] | Freq: Four times a day (QID) | OPHTHALMIC | Status: DC

## 2018-01-19 MED ORDER — KETOROLAC TROMETHAMINE 0.5 % OP SOLN
1.0000 [drp] | Freq: Four times a day (QID) | OPHTHALMIC | Status: DC
  Administered 2018-01-20 – 2018-01-22 (×8): 1 [drp] via OPHTHALMIC
  Filled 2018-01-19: qty 5

## 2018-01-19 MED ORDER — OMEGA-3-ACID ETHYL ESTERS 1 G PO CAPS
2.0000 g | ORAL_CAPSULE | Freq: Two times a day (BID) | ORAL | Status: DC
  Administered 2018-01-19 – 2018-01-21 (×4): 2 g via ORAL
  Filled 2018-01-19 (×5): qty 2

## 2018-01-19 MED ORDER — FERROUS GLUCONATE 324 (38 FE) MG PO TABS
324.0000 mg | ORAL_TABLET | Freq: Every day | ORAL | Status: DC
  Administered 2018-01-20 – 2018-01-22 (×3): 324 mg via ORAL
  Filled 2018-01-19 (×3): qty 1

## 2018-01-19 MED ORDER — LORAZEPAM 2 MG/ML IJ SOLN
1.0000 mg | Freq: Once | INTRAMUSCULAR | Status: AC
  Administered 2018-01-19: 1 mg via INTRAVENOUS
  Filled 2018-01-19: qty 1

## 2018-01-19 MED ORDER — TAMSULOSIN HCL 0.4 MG PO CAPS
0.4000 mg | ORAL_CAPSULE | Freq: Every day | ORAL | Status: DC
  Administered 2018-01-20 – 2018-01-22 (×3): 0.4 mg via ORAL
  Filled 2018-01-19 (×3): qty 1

## 2018-01-19 MED ORDER — ATORVASTATIN CALCIUM 20 MG PO TABS
20.0000 mg | ORAL_TABLET | Freq: Every day | ORAL | Status: DC
  Administered 2018-01-19 – 2018-01-22 (×4): 20 mg via ORAL
  Filled 2018-01-19 (×4): qty 1

## 2018-01-19 MED ORDER — PREDNISOLONE ACETATE 1 % OP SUSP
1.0000 [drp] | Freq: Four times a day (QID) | OPHTHALMIC | Status: DC
  Administered 2018-01-19 – 2018-01-22 (×10): 1 [drp] via OPHTHALMIC
  Filled 2018-01-19: qty 5

## 2018-01-19 MED ORDER — CLOPIDOGREL BISULFATE 75 MG PO TABS
75.0000 mg | ORAL_TABLET | Freq: Every day | ORAL | Status: DC
  Administered 2018-01-19 – 2018-01-22 (×4): 75 mg via ORAL
  Filled 2018-01-19 (×4): qty 1

## 2018-01-19 MED ORDER — HALOPERIDOL LACTATE 5 MG/ML IJ SOLN
2.5000 mg | Freq: Once | INTRAMUSCULAR | Status: AC
  Administered 2018-01-19: 2.5 mg via INTRAVENOUS
  Filled 2018-01-19: qty 1

## 2018-01-19 MED ORDER — OFLOXACIN 0.3 % OP SOLN
1.0000 [drp] | Freq: Four times a day (QID) | OPHTHALMIC | Status: DC
  Administered 2018-01-19 – 2018-01-22 (×10): 1 [drp] via OPHTHALMIC
  Filled 2018-01-19: qty 5

## 2018-01-19 MED ORDER — ASPIRIN EC 81 MG PO TBEC
81.0000 mg | DELAYED_RELEASE_TABLET | Freq: Every day | ORAL | Status: DC
  Administered 2018-01-19 – 2018-01-22 (×4): 81 mg via ORAL
  Filled 2018-01-19 (×4): qty 1

## 2018-01-19 NOTE — Progress Notes (Signed)
Occupational Therapy Evaluation Patient Details Name: Timothy [name] MRN: [id number] DOB: 1941/02/14 Today's Date: 01/19/2018    History of Present Illness 77 y.o. male with medical history significant of alcohol abuse, hypertension, diabetes, previous poor vision who is a resident of local nursing facility that was found to be having significant altered mental status. MRI + acute left cerebral hemispheric CVA.   Clinical Impression   Interpreter Y Hin Steva Colder [phone]) used during session. PTA, pt living at what appears to be an assisted living facility. Pt demonstrates a functional decline due to deficits listed below and would benefit from rehab at a SNF. Pt appaers to demonstrate significant visual impairments. Pt currently requires min A with ADL and mod assit with mobility using straight cane due to visual deficits. Will follow acutely to address established goals and facilitate safe DC to next venue of care.     Follow Up Recommendations  SNF;Supervision/Assistance - 24 hour    Equipment Recommendations  None recommended by OT    Recommendations for Other Services       Precautions / Restrictions Precautions Precautions: Fall Precaution Comments: significant visual impariment      Mobility Bed Mobility               General bed mobility comments: OOB in chair  Transfers Overall transfer level: Needs assistance   Transfers: Sit to/from Stand;Stand Pivot Transfers Sit to Stand: Min assist Stand pivot transfers: Min assist       General transfer comment: increased assistance with mobility to mod A due to visual impairment    Balance Overall balance assessment: Needs assistance   Sitting balance-Leahy Scale: Good       Standing balance-Leahy Scale: Fair                             ADL either performed or assessed with clinical judgement   ADL Overall ADL's : Needs assistance/impaired Eating/Feeding: Minimal assistance;Sitting   Grooming:  Minimal assistance;Sitting   Upper Body Bathing: Minimal assistance;Sitting   Lower Body Bathing: Minimal assistance;Sit to/from stand   Upper Body Dressing : Minimal assistance;Sitting   Lower Body Dressing: Minimal assistance;Sit to/from stand   Toilet Transfer: Ambulation;Moderate assistance(straight cane)           Functional mobility during ADLs: Moderate assistance(due to visual impairment)       Vision   Vision Assessment?: Yes Eye Alignment: Within Functional Limits Ocular Range of Motion: Within Functional Limits Alignment/Gaze Preference: Within Defined Limits Tracking/Visual Pursuits: Requires cues, head turns, or add eye shifts to track Saccades: Additional eye shifts occurred during testing;Additional head turns occurred during testing;Decreased speed of saccadic movement Visual Fields: Right visual field deficit Additional Comments: Appears significantly impaired     Perception Perception Comments: will further assess   Praxis      Pertinent Vitals/Pain Pain Assessment: Faces Faces Pain Scale: No hurt     Hand Dominance Right   Extremity/Trunk Assessment Upper Extremity Assessment Upper Extremity Assessment: Generalized weakness   Lower Extremity Assessment Lower Extremity Assessment: Defer to PT evaluation   Cervical / Trunk Assessment Cervical / Trunk Assessment: Normal   Communication Communication Communication: Prefers language other than English(language appears impaired per interpreter)   Cognition Arousal/Alertness: Awake/alert Behavior During Therapy: WFL for tasks assessed/performed Overall Cognitive Status: Difficult to assess  General Comments       Exercises     Shoulder Instructions      Home Living Family/patient expects to be discharged to:: Skilled nursing facility     Type of Home: Assisted living                                  Prior  Functioning/Environment Level of Independence: Independent with assistive device(s)        Comments: used cane; had help with bathing/dressing from staff as needed; walked to the dining room        OT Problem List: Decreased activity tolerance;Impaired balance (sitting and/or standing);Impaired vision/perception;Decreased coordination;Decreased cognition;Decreased safety awareness;Decreased knowledge of use of DME or AE      OT Treatment/Interventions: Self-care/ADL training;Neuromuscular education;DME and/or AE instruction;Therapeutic exercise;Therapeutic activities;Cognitive remediation/compensation;Visual/perceptual remediation/compensation;Patient/family education;Balance training    OT Goals(Current goals can be found in the care plan section) Acute Rehab OT Goals Patient Stated Goal: none stated OT Goal Formulation: With patient(using interpreter) Time For Goal Achievement: 02/02/18 Potential to Achieve Goals: Good  OT Frequency: Min 2X/week   Barriers to D/C:            Co-evaluation              AM-PAC PT "6 Clicks" Daily Activity     Outcome Measure Help from another person eating meals?: A Little Help from another person taking care of personal grooming?: A Little Help from another person toileting, which includes using toliet, bedpan, or urinal?: A Little Help from another person bathing (including washing, rinsing, drying)?: A Little Help from another person to put on and taking off regular upper body clothing?: A Little Help from another person to put on and taking off regular lower body clothing?: A Little 6 Click Score: 18   End of Session Nurse Communication: Mobility status  Activity Tolerance: Patient tolerated treatment well Patient left: in chair;with call bell/phone within reach;with chair alarm set  OT Visit Diagnosis: Unsteadiness on feet (R26.81);Low vision, both eyes (H54.2);Other symptoms and signs involving cognitive function                 Time: 5400-8676 OT Time Calculation (min): 20 min Charges:  OT General Charges $OT Visit: 1 Visit OT Evaluation $OT Eval Moderate Complexity: [place], OT/L  OT Clinical Specialist [phone]   [facility] 01/19/2018, 11:55 AM

## 2018-01-19 NOTE — Evaluation (Signed)
Speech Language Pathology Evaluation Patient Details Name: Akil Hoos MRN: [id number] DOB: 1940-10-17 Today's Date: 01/19/2018 Time: 5093-2671 SLP Time Calculation (min) (ACUTE ONLY): 13 min  Problem List:  Patient Active Problem List   Diagnosis Date Noted  . Altered mental status 01/18/2018  . CVA (cerebral vascular accident) ([place]) 01/18/2018  . Leukocytosis 12/02/2017  . Acute renal failure superimposed on chronic kidney disease ([place]) 12/01/2017  . ARF (acute renal failure) ([place]) 12/01/2017  . 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 PNA (pneumonia) 07/09/2017  . HCAP (healthcare-associated pneumonia)   . Hypertension 06/26/2017  . Hypertensive emergency 06/25/2017  . Acute kidney injury superimposed on chronic kidney disease ([place]) 06/25/2017  . Hyperkalemia 06/25/2017  . 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 Left retinal detachment   . 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 Bilateral cataracts 04/08/2017  . DM (diabetes mellitus), type 2 with renal complications ([place])   . Alcohol abuse   . Poor vision   . Chronic kidney disease 3 02/13/2017  . Anemia of chronic disease 02/13/2017  . 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 Epigastric pain 02/11/2017  . Fall at home 11/16/2013  . Closed TBI (traumatic brain injury) ([place]) 11/16/2013  . SDH (subdural hematoma) ([place]) 11/15/2013   Past Medical History:  Past Medical History:  Diagnosis Date  . Alcohol abuse    Patient denies, but was shared with people from [facility] by his male roommate.  . Bilateral cataracts 04/08/2017  . DM (diabetes mellitus), type 2 with renal complications ([place])    uncertain when diagnosed  . HOH (hard of hearing)   . Hypertension   . Legally blind    B/L  . Poor vision    Past Surgical History:  Past Surgical History:  Procedure Laterality Date  . MEMBRANE PEEL Right 09/29/2017   Procedure: MEMBRANE PEEL WITH AIR GAS;  Surgeon: Jalene Mullet, MD;  Location: [place];  Service:  Ophthalmology;  Laterality: Right;  . PARS PLANA VITRECTOMY Left 06/29/2017   Procedure: PARS PLANA VITRECTOMY WITH 25 GAUGE AND MEMBRANE PEEL WITH GAS EXCHANGE, AIR SILICONE OIL AND PHACO COAGULATION, LASER;  Surgeon: Jalene Mullet, MD;  Location: [place];  Service: Ophthalmology;  Laterality: Left;  . PARS PLANA VITRECTOMY Right 09/18/2017   Procedure: PARS PLANA VITRECTOMY WITH 25 GAUGE MEMBRANE PILL WITH AIR GAS SILICONE OIL AND PHOTOCOAGULATION;  Surgeon: Jalene Mullet, MD;  Location: [place];  Service: Ophthalmology;  Laterality: Right;  . PARS PLANA VITRECTOMY Right 09/29/2017   Procedure: PARS PLANA VITRECTOMY WITH 25 GAUGE;  Surgeon: Jalene Mullet, MD;  Location: [place];  Service: Ophthalmology;  Laterality: Right;  . PHOTOCOAGULATION Right 09/29/2017   Procedure: PHOTOCOAGULATION;  Surgeon: Jalene Mullet, MD;  Location: [place];  Service: Ophthalmology;  Laterality: Right;  . REPAIR OF COMPLEX TRACTION RETINAL DETACHMENT Right 09/29/2017   Procedure: REPAIR OF COMPLEX TRACTION RETINAL DETACHMENT;  Surgeon: Jalene Mullet, MD;  Location: [place];  Service: Ophthalmology;  Laterality: Right;   HPI:  Pt is a 77 yo male who presented from ALF with AMS. CT Head was concerning for large area of low attenuation in the L cerebellar hemisphere; MRI showed patchy acute infarct along the cerebral convexities and left corona radiata. CXR revealed a LLL consolidation. PMH: legally blind, HTN, HOH, DM, bilateral cateracts, alcohol abuse   Assessment / Plan / Recommendation Clinical Impression  Attempted to see pt with use of tele interpreter but unsuccessfully due to visual/hearing deficits and the fact that his dialect was  not available. His visitor (listed as contact) wanted to interpret. She reports that he is more difficult to understand than he usually is, and with further questioning she says it is because his speech is not clear. He is oriented to person only and has difficulty recalling earlier events  of hospital stay. His intellectual awareness is limited, as evidenced by a response of "I don't know" whenever asked about cognitive, communicative, or physical abilities. Recommend additional SLP f/u to maximzie functional cognition, communication, and safety.    SLP Assessment  SLP Recommendation/Assessment: Patient needs continued Speech Lanaguage Pathology Services SLP Visit Diagnosis: Cognitive communication deficit (R41.841);Dysarthria and anarthria (R47.1)    Follow Up Recommendations  (tba)    Frequency and Duration min 2x/week  1 week      SLP Evaluation Cognition  Overall Cognitive Status: Difficult to assess Arousal/Alertness: Awake/alert Orientation Level: Oriented to person;Disoriented to place;Disoriented to situation Attention: Sustained Sustained Attention: Appears intact(during simple, self-feeding task) Memory: Impaired Memory Impairment: Decreased recall of new information Awareness: Impaired Awareness Impairment: Intellectual impairment       Comprehension  Auditory Comprehension Overall Auditory Comprehension: Appears within functional limits for tasks assessed(follows simple one-step commands well)    Expression Expression Primary Mode of Expression: Verbal Verbal Expression Overall Verbal Expression: Other (comment)(difficult to assess)   Oral / Motor  Motor Speech Overall Motor Speech: Impaired(slurred speech per visitor)   GO                    [name] 01/19/2018, 11:03 AM   [name], M.A. [place] Acute Environmental education officer [phone] Office [phone]

## 2018-01-19 NOTE — Evaluation (Signed)
Physical Therapy Evaluation Patient Details Name: Timothy [name] MRN: [id number] DOB: [birth year] Today's Date: 01/19/2018   History of Present Illness  77 y.o. male with medical history significant of alcohol abuse, hypertension, diabetes, previous poor vision who is a resident of local assisted living facility that was found to be having significant altered mental status. MRI + acute left cerebral hemispheric CVA.  Clinical Impression  Pt admitted with above diagnosis. Pt currently with functional limitations due to the deficits listed below (see PT Problem List). Lived at an assisted living facility prior to this admission, and walked with cane to dining hall; Presents with decr balance, incr fallrisk, visual impairment that he states is more and different than prior to admission;   Pt will benefit from skilled PT to increase their independence and safety with mobility to allow discharge to the venue listed below.    Interpreter Y Hin Steva Colder [phone]) present and facilitated communication during session.     Follow Up Recommendations SNF    Equipment Recommendations  Rolling walker with 5" wheels;3in1 (PT)(to be determined)    Recommendations for Other Services       Precautions / Restrictions Precautions Precautions: Fall Precaution Comments: significant visual impariment Restrictions Weight Bearing Restrictions: No      Mobility  Bed Mobility Overal bed mobility: Needs Assistance Bed Mobility: Supine to Sit     Supine to sit: Min assist     General bed mobility comments: min handheld assist to pull to sit  Transfers Overall transfer level: Needs assistance Equipment used: 1 person hand held assist Transfers: Sit to/from [facility] Sit to Stand: Min assist Stand pivot transfers: Mod assist       General transfer comment: increased assistance with mobility to mod A due to visual impairment an ddecr steadiness  Ambulation/Gait Ambulation/Gait  assistance: Mod assist Gait Distance (Feet): (march in place standing in front of recliner) Assistive device: 1 person hand held assist       General Gait Details: cues to move slowly; decr stability in single limb stance; dependent on UE support for dynamic balance  Stairs            Wheelchair Mobility    Modified Rankin (Stroke Patients Only)       Balance Overall balance assessment: Needs assistance   Sitting balance-Leahy Scale: Good       Standing balance-Leahy Scale: Fair                               Pertinent Vitals/Pain Pain Assessment: Faces Pain Score: 0-No pain Faces Pain Scale: No hurt    Home Living Family/patient expects to be discharged to:: Skilled nursing facility     Type of Home: Assisted living                Prior Function Level of Independence: Independent with assistive device(s)         Comments: used cane; had help with bathing/dressing from staff as needed; walked to the dining room     Hand Dominance   Dominant Hand: Right    Extremity/Trunk Assessment   Upper Extremity Assessment Upper Extremity Assessment: Defer to OT evaluation    Lower Extremity Assessment Lower Extremity Assessment: Generalized weakness    Cervical / Trunk Assessment Cervical / Trunk Assessment: Normal  Communication   Communication: Prefers language other than English(language appears impaired per interpreter)  Cognition Arousal/Alertness: Awake/alert Behavior During Therapy: WFL for tasks  assessed/performed Overall Cognitive Status: Impaired/Different from baseline Area of Impairment: Orientation                 Orientation Level: Disoriented to;Place;Situation             General Comments: Pt stated he is not in [place], mentioned his village and his father when asked where we are      General Comments      Exercises     Assessment/Plan    PT Assessment Patient needs continued PT services  PT  Problem List Decreased strength;Decreased activity tolerance;Decreased balance;Decreased mobility;Decreased coordination;Decreased cognition;Decreased knowledge of use of DME;Decreased safety awareness;Decreased knowledge of precautions       PT Treatment Interventions DME instruction;Gait training;Functional mobility training;Therapeutic activities;Therapeutic exercise;Balance training;Neuromuscular re-education;Cognitive remediation;Patient/family education    PT Goals (Current goals can be found in the Care Plan section)  Acute Rehab PT Goals Patient Stated Goal: none stated, but agreeable to getting OOB to chair PT Goal Formulation: Patient unable to participate in goal setting Time For Goal Achievement: 02/02/18 Potential to Achieve Goals: Good    Frequency Min 4X/week   Barriers to discharge        Co-evaluation               AM-PAC PT "6 Clicks" Daily Activity  Outcome Measure Difficulty turning over in bed (including adjusting bedclothes, sheets and blankets)?: None Difficulty moving from lying on back to sitting on the side of the bed? : A Little Difficulty sitting down on and standing up from a chair with arms (e.g., wheelchair, bedside commode, etc,.)?: A Lot Help needed moving to and from a bed to chair (including a wheelchair)?: A Little Help needed walking in hospital room?: A Lot Help needed climbing 3-5 steps with a railing? : A Lot 6 Click Score: 16    End of Session Equipment Utilized During Treatment: Gait belt Activity Tolerance: Patient tolerated treatment well Patient left: in chair;with call bell/phone within reach;with chair alarm set Nurse Communication: Mobility status PT Visit Diagnosis: Unsteadiness on feet (R26.81);Other abnormalities of gait and mobility (R26.89);Difficulty in walking, not elsewhere classified (R26.2)    Time: 7824-2353 PT Time Calculation (min) (ACUTE ONLY): 44 min   Charges:   PT Evaluation $PT Eval Moderate  Complexity: 1 Mod PT Treatments $Gait Training: 8-22 mins $Therapeutic Activity: 8-22 mins        [name], PT  Acute Rehabilitation Services Pager [phone] Office [place] 01/19/2018, 12:49 PM

## 2018-01-19 NOTE — Telephone Encounter (Signed)
Stacy from CT called to make [name] aware that patient was admitted to hospital 01/18/2018 for CVA, CT has been cancelled. Patient will need hospital follow up after discharge. Will route information to BI as FYI. Nothing further needed at this time.

## 2018-01-19 NOTE — Evaluation (Signed)
Clinical/Bedside Swallow Evaluation Patient Details  Name: Timothy [name] MRN: [id number] Date of Birth: 18-Aug-1940  Today's Date: 01/19/2018 Time: SLP Start Time (ACUTE ONLY): 3546 SLP Stop Time (ACUTE ONLY): 5681 SLP Time Calculation (min) (ACUTE ONLY): 17 min  Past Medical History:  Past Medical History:  Diagnosis Date  . Alcohol abuse    Patient denies, but was shared with people from [facility] by his male roommate.  . Bilateral cataracts 04/08/2017  . DM (diabetes mellitus), type 2 with renal complications ([place])    uncertain when diagnosed  . HOH (hard of hearing)   . Hypertension   . Legally blind    B/L  . Poor vision    Past Surgical History:  Past Surgical History:  Procedure Laterality Date  . MEMBRANE PEEL Right 09/29/2017   Procedure: MEMBRANE PEEL WITH AIR GAS;  Surgeon: Jalene Mullet, MD;  Location: [place];  Service: Ophthalmology;  Laterality: Right;  . PARS PLANA VITRECTOMY Left 06/29/2017   Procedure: PARS PLANA VITRECTOMY WITH 25 GAUGE AND MEMBRANE PEEL WITH GAS EXCHANGE, AIR SILICONE OIL AND PHACO COAGULATION, LASER;  Surgeon: Jalene Mullet, MD;  Location: [place];  Service: Ophthalmology;  Laterality: Left;  . PARS PLANA VITRECTOMY Right 09/18/2017   Procedure: PARS PLANA VITRECTOMY WITH 25 GAUGE MEMBRANE PILL WITH AIR GAS SILICONE OIL AND PHOTOCOAGULATION;  Surgeon: Jalene Mullet, MD;  Location: [place];  Service: Ophthalmology;  Laterality: Right;  . PARS PLANA VITRECTOMY Right 09/29/2017   Procedure: PARS PLANA VITRECTOMY WITH 25 GAUGE;  Surgeon: Jalene Mullet, MD;  Location: [place];  Service: Ophthalmology;  Laterality: Right;  . PHOTOCOAGULATION Right 09/29/2017   Procedure: PHOTOCOAGULATION;  Surgeon: Jalene Mullet, MD;  Location: [place];  Service: Ophthalmology;  Laterality: Right;  . REPAIR OF COMPLEX TRACTION RETINAL DETACHMENT Right 09/29/2017   Procedure: REPAIR OF COMPLEX TRACTION RETINAL DETACHMENT;  Surgeon: Jalene Mullet, MD;  Location: [place];  Service:  Ophthalmology;  Laterality: Right;   HPI:  Pt is a 77 yo male who presented from ALF with AMS. CT Head was concerning for large area of low attenuation in the L cerebellar hemisphere; MRI showed patchy acute infarct along the cerebral convexities and left corona radiata. CXR revealed a LLL consolidation. PMH: legally blind, HTN, HOH, DM, bilateral cateracts, alcohol abuse   Assessment / Plan / Recommendation Clinical Impression  Attempted to see pt with use of tele interpreter but unsuccessfully due to visual/hearing deficits and the fact that his dialect was not available. His visitor (listed as contact) insisted on interpreting. Pt says that her prefers softer foods given the condition of his dentition, but with no other overt signs of dysphagia or aspiration. Recommend starting Dys 2 (chopped) diet, thin liquids. Pt will likely need full supervision during meals due to visual deficits. SLP will f/u briefly. SLP Visit Diagnosis: Dysphagia, unspecified (R13.10)    Aspiration Risk  Mild aspiration risk    Diet Recommendation Dysphagia 2 (Fine chop);Thin liquid   Liquid Administration via: Cup;Straw Medication Administration: Whole meds with liquid Supervision: Staff to assist with self feeding;Full supervision/cueing for compensatory strategies Compensations: Slow rate;Small sips/bites Postural Changes: Seated upright at 90 degrees    Other  Recommendations Oral Care Recommendations: Oral care BID   Follow up Recommendations 24 hour supervision/assistance      Frequency and Duration min 1 x/week  1 week       Prognosis Prognosis for Safe Diet Advancement: (baseline diet)      Swallow Study   General HPI: Pt  is a 77 yo male who presented from ALF with AMS. CT Head was concerning for large area of low attenuation in the L cerebellar hemisphere; MRI showed patchy acute infarct along the cerebral convexities and left corona radiata. CXR revealed a LLL consolidation. PMH: legally  blind, HTN, HOH, DM, bilateral cateracts, alcohol abuse Type of Study: Bedside Swallow Evaluation Previous Swallow Assessment: none in chart Diet Prior to this Study: NPO Temperature Spikes Noted: No Respiratory Status: Room air History of Recent Intubation: No Behavior/Cognition: Alert;Cooperative Oral Cavity Assessment: Within Functional Limits Oral Care Completed by SLP: No Oral Cavity - Dentition: Poor condition;Missing dentition Vision: Impaired for self-feeding Self-Feeding Abilities: Needs assist Patient Positioning: Upright in bed Baseline Vocal Quality: Normal    Oral/Motor/Sensory Function     Ice Chips Ice chips: Not tested   Thin Liquid Thin Liquid: Within functional limits Presentation: Cup;Self Fed;Straw    Nectar Thick Nectar Thick Liquid: Not tested   Honey Thick Honey Thick Liquid: Not tested   Puree Puree: Within functional limits Presentation: Self Fed;Spoon   Solid     Solid: Within functional limits Presentation: Self [place] 01/19/2018,10:48 AM  [name], M.A. [place] Acute Environmental education officer [phone] Office [phone]

## 2018-01-19 NOTE — Plan of Care (Signed)
Doing well no new complain will continue to monitor

## 2018-01-19 NOTE — Telephone Encounter (Signed)
Spoke with Dr. Amil Amen regarding patient. She is aware he miss his appointement.

## 2018-01-19 NOTE — Progress Notes (Signed)
PROGRESS NOTE    Timothy [name]  AOZ:[id number] DOB: [birth year] DOA: 01/18/2018 PCP: [name], PA-C      Brief Narrative:  [name] is a 77 y.o. M with dementia, ALF-dwelling, legally blind, HTN, DM, CKD and anemia who presented with change in mental status.  Per ER notes "Patient is a resident of a nursing facility.  They went to give the patient lunch today and he was apparently not acting normally.  EMS reports that the nursing home was not able to clearly tell him what his normal baseline was and when this change in behavior occurred.  The patient is unable to provide [name] any history.  We attempted to use the video translator service but the patient would not answer any questions."  In the field code STEMI was activated due to perceived ECG changes, but this was cancelled by Cardiology on arrival.  Subsequently, CT head was obtained that showed subacute stroke. Neurology evaluated patient and recommended no tPA and so he was admitted to the hospitalist service for stroke work up.   Assessment & Plan:  Stroke -Non-invasive angiography was motion degraded but showed no significant stenoses -Echocardiogram pending -Carotid imaging unremarkable by preliminary read -Lipids ordered: LDL 40s, home atorvastatin 20 mg and Lovaza continued -Aspirin ordered at admission, currently on aspirin 81 and Plavix -Atrial fibrillation: Not present -tPA not given because outside stroke window -Dysphagia screen ordered in ER -PT eval ordered -Smoking cessation: not applicable   Hypertension Permissive HTN for now. -Hold home amlodipine 10, carvedilol 6.25, hydralazine, and Imdur  Blindness -Continue home eye drops  Diabetes HgbA1c 7.1%, good control.  Acute on chronic kidney disease stage IV Baseline Cr 1.8-2.4. Admitted with creatinine 3.3, improved overnight with fluids. -Continue bicarb  Iron deficiency anemia and anemia of chronic disease Stable relative to baseline -Continue  iron  Hyperkalemia Resolved  Chronic bronchiectasis Extensive work up planned by Pulmonology this Fall.  Other medications -Continue Flomax      DVT prophylaxis: Lovenox Code Status: FULL Family Communication: None present MDM and disposition Plan: The below labs and imaging reports were reviewed and summarized above.  Medication management as above.  The patient was admitted with Stroke, subacute, likely 2-3 days before admission. Continue current stroke work up with echocardiogram, Neurology evaluation.  PT eval and likely return to SNF in 1-2 days.   Consultants:   Neurology  Procedures:   Echocardiogram  MRI/MRA head  US Carotids  Antimicrobials:   None    Subjective: Level 5 caveat due to dementia, blindness.  Objective: Vitals:   01/19/18 0142 01/19/18 0327 01/19/18 0537 01/19/18 1405  BP: (!) 188/79 (!) 166/61 (!) 152/65 (!) 177/73  Pulse: 83 78 76 65  Resp: 16 18 18 20   Temp: 97.6 F (36.4 C) 98.1 F (36.7 C) 97.7 F (36.5 C) 97.7 F (36.5 C)  TempSrc: Oral Oral Oral Oral  SpO2: 100% 100% 100% 100%  Weight:      Height:        Intake/Output Summary (Last 24 hours) at 01/19/2018 1825 Last data filed at 01/19/2018 1730 Gross per 24 hour  Intake 4575.16 ml  Output 1275 ml  Net 3300.16 ml   Filed Weights   01/18/18 1523 01/18/18 2038  Weight: 46.9 kg 46.8 kg    Examination: General appearance: Thin adult male, lying in bed, has soiled himself, unable to get out of bed, speaking broken English interspersed with another language, in no acute distress.   HEENT: Anicteric, conjunctiva pink,  lids and lashes normal. No nasal deformity, discharge, epistaxis.  Lips moist, dentition poor, no oral lesions, hearing normal.   Skin: Warm and dry.  no jaundice.  No suspicious rashes or lesions. Cardiac: RRR, nl S1-S2, no murmurs appreciated.  Capillary refill is brisk.  JVP normal.  No LE edema.  Radia  pulses 2+ and symmetric. Respiratory: Normal  respiratory rate and rhythm.  CTAB without rales or wheezes. Abdomen: Abdomen soft.  no TTP. No ascites, distension, hepatosplenomegaly.   MSK: No deformities or effusions. Neuro: Awake and alert but reaches for me because he can see my voice. Moves both upper extremities, does not follow commands, unable to tell if speech is fluent due to language barrier  Psych: unable to assess.    Data Reviewed: I have personally reviewed following labs and imaging studies:  CBC: Recent Labs  Lab 01/18/18 1526 01/19/18 0339  WBC 12.9* 13.9*  NEUTROABS 8.9*  --   HGB 10.2* 9.9*  HCT 33.7* 32.1*  MCV 80.8 79.7  PLT 291 017   Basic Metabolic Panel: Recent Labs  Lab 01/18/18 1526 01/19/18 0339  NA 140 144  K 5.9* 4.3  CL 108 116*  CO2 22 21*  GLUCOSE 168* 121*  BUN 50* 40*  CREATININE 3.26* 2.38*  CALCIUM 9.2 9.0   GFR: Estimated Creatinine Clearance: 16.7 mL/min (A) (by C-G formula based on SCr of 2.38 mg/dL (H)). Liver Function Tests: Recent Labs  Lab 01/18/18 1526 01/19/18 0339  AST 30 21  ALT 14 11  ALKPHOS 83 78  BILITOT 0.3 0.8  PROT 7.3 6.8  ALBUMIN 3.5 3.4*   No results for input(s): LIPASE, AMYLASE in the last 168 hours. No results for input(s): AMMONIA in the last 168 hours. Coagulation Profile: Recent Labs  Lab 01/18/18 1526  INR 0.96   Cardiac Enzymes: Recent Labs  Lab 01/18/18 1526  TROPONINI <0.03   BNP (last 3 results) No results for input(s): PROBNP in the last 8760 hours. HbA1C: Recent Labs    01/19/18 0339  HGBA1C 7.1*   CBG: No results for input(s): GLUCAP in the last 168 hours. Lipid Profile: Recent Labs    01/18/18 1526 01/19/18 0339  CHOL 130 124  HDL 41 45  LDLCALC 45 58  TRIG 218* 104  CHOLHDL 3.2 2.8   Thyroid Function Tests: No results for input(s): TSH, T4TOTAL, FREET4, T3FREE, THYROIDAB in the last 72 hours. Anemia Panel: No results for input(s): VITAMINB12, FOLATE, FERRITIN, TIBC, IRON, RETICCTPCT in the last 72  hours. Urine analysis:    Component Value Date/Time   COLORURINE YELLOW 01/18/2018 1817   APPEARANCEUR HAZY (A) 01/18/2018 1817   LABSPEC 1.013 01/18/2018 1817   PHURINE 5.0 01/18/2018 1817   GLUCOSEU 50 (A) 01/18/2018 1817   HGBUR NEGATIVE 01/18/2018 1817   BILIRUBINUR NEGATIVE 01/18/2018 1817   KETONESUR NEGATIVE 01/18/2018 1817   PROTEINUR >=300 (A) 01/18/2018 1817   UROBILINOGEN 0.2 11/15/2013 0231   NITRITE NEGATIVE 01/18/2018 1817   LEUKOCYTESUR NEGATIVE 01/18/2018 1817   Sepsis Labs: @LABRCNTIP (procalcitonin:4,lacticacidven:4)  ) Recent Results (from the past 240 hour(s))  MRSA PCR Screening     Status: Abnormal   Collection Time: 01/18/18  8:47 PM  Result Value Ref Range Status   MRSA by PCR POSITIVE (A) NEGATIVE Final    Comment:        The GeneXpert MRSA Assay (FDA approved for NASAL specimens only), is one component of a comprehensive MRSA colonization surveillance program. It is not intended to diagnose MRSA  infection nor to guide or monitor treatment for MRSA infections. RESULT CALLED TO, READ BACK BY AND VERIFIED WITH: Dorena Bodo RN 01/18/18 2325 JDW Performed at [facility] [address] [place], [place] [address]          Radiology Studies: Ct Head Wo Contrast  Result Date: 01/18/2018 CLINICAL DATA:  Patient with altered mental status. EXAM: CT HEAD WITHOUT CONTRAST TECHNIQUE: Contiguous axial images were obtained from the base of the skull through the vertex without intravenous contrast. COMPARISON:  Brain CT 11/15/2013. FINDINGS: Brain: Ventricles and sulci are prominent compatible with atrophy. Chronic bilateral thalamic lacunar infarcts. Patchy low attenuation within the left frontal lobe (image 22; series 3). Large area of low attenuation within the left cerebellar hemisphere (image 8; series 3). No mass lesion or mass-effect. Vascular: Unremarkable Skull: Intact. Sinuses/Orbits: Left maxillary sinus mucosal thickening. Mastoid air cells are  unremarkable. Postprocedural changes within the orbits. Other: None. IMPRESSION: Large area of low attenuation within the left cerebellar hemisphere most compatible with subacute infarct. Small area of low attenuation within the left frontal lobe most compatible with age-indeterminate infarct. These results were called by telephone at the time of interpretation on 01/18/2018 at 4:24 pm to Dr. Dorie Rank , who verbally acknowledged these results. Electronically Signed   By: Lovey Newcomer M.D.   On: 01/18/2018 16:26   Mr [name] Head Wo Contrast  Result Date: 01/18/2018 CLINICAL DATA:  Altered mental status. EXAM: MRI HEAD WITHOUT CONTRAST MRA HEAD WITHOUT CONTRAST TECHNIQUE: Multiplanar, multiecho pulse sequences of the brain and surrounding structures were obtained without intravenous contrast. Angiographic images of the head were obtained using MRA technique without contrast. COMPARISON:  Head CT earlier today FINDINGS: MRI HEAD FINDINGS Brain: Left inferior cerebellar infarct is remote. There are patchy acute infarcts that are overall small in size, seen along the bilateral cerebral convexities and at the left corona radiata. The most confluent cortically based infarcts measured 2.4 cm in the right occipital lobe and 2.8 cm in the anterior left frontal lobe. Remote lacunar infarcts in the bilateral thalamus, right caudate head, and pons. Remote hemorrhage in the external capsule on the right with hemosiderin staining. There is moderate ischemic gliosis in the periventricular white matter. No acute hemorrhage, hydrocephalus, or masslike finding. Vascular: Arterial findings below. Preserved dural venous sinus flow voids. Skull and upper cervical spine: No evidence of marrow lesion Sinuses/Orbits: Postoperative globes. Mild mucosal thickening in the left maxillary sinus. MRA HEAD FINDINGS Motion degraded. The carotid, vertebral, and basilar arteries are patent. Probable generalized atheromatous undulation of vessels. No  branch occlusion or flow limiting stenosis suspected. No suspected aneurysm. IMPRESSION: 1. Patchy acute infarct along the cerebral convexities and left corona radiata. These are seen in the anterior and posterior circulation and suggest central embolic disease. 2. Remote left PICA infarct. Multiple remote lacunar infarcts. 3. Remote external capsule hemorrhage on the right. 4. Motion degraded intracranial MRA without acute finding. Electronically Signed   By: Monte Fantasia M.D.   On: 01/18/2018 20:05   Mr Brain Wo Contrast  Result Date: 01/18/2018 CLINICAL DATA:  Altered mental status. EXAM: MRI HEAD WITHOUT CONTRAST MRA HEAD WITHOUT CONTRAST TECHNIQUE: Multiplanar, multiecho pulse sequences of the brain and surrounding structures were obtained without intravenous contrast. Angiographic images of the head were obtained using MRA technique without contrast. COMPARISON:  Head CT earlier today FINDINGS: MRI HEAD FINDINGS Brain: Left inferior cerebellar infarct is remote. There are patchy acute infarcts that are overall small in size,  seen along the bilateral cerebral convexities and at the left corona radiata. The most confluent cortically based infarcts measured 2.4 cm in the right occipital lobe and 2.8 cm in the anterior left frontal lobe. Remote lacunar infarcts in the bilateral thalamus, right caudate head, and pons. Remote hemorrhage in the external capsule on the right with hemosiderin staining. There is moderate ischemic gliosis in the periventricular white matter. No acute hemorrhage, hydrocephalus, or masslike finding. Vascular: Arterial findings below. Preserved dural venous sinus flow voids. Skull and upper cervical spine: No evidence of marrow lesion Sinuses/Orbits: Postoperative globes. Mild mucosal thickening in the left maxillary sinus. MRA HEAD FINDINGS Motion degraded. The carotid, vertebral, and basilar arteries are patent. Probable generalized atheromatous undulation of vessels. No branch  occlusion or flow limiting stenosis suspected. No suspected aneurysm. IMPRESSION: 1. Patchy acute infarct along the cerebral convexities and left corona radiata. These are seen in the anterior and posterior circulation and suggest central embolic disease. 2. Remote left PICA infarct. Multiple remote lacunar infarcts. 3. Remote external capsule hemorrhage on the right. 4. Motion degraded intracranial MRA without acute finding. Electronically Signed   By: Monte Fantasia M.D.   On: 01/18/2018 20:05   Dg Chest Portable 1 View  Result Date: 01/18/2018 CLINICAL DATA:  Altered mental status EXAM: PORTABLE CHEST 1 VIEW COMPARISON:  Chest radiograph 12/04/2017 FINDINGS: Monitoring leads overlie the patient. Stable cardiac and mediastinal contours. Patchy consolidation left lower hemithorax. Small left pleural effusion. No pneumothorax. IMPRESSION: Patchy consolidation left lower hemithorax may be secondary to known bronchiectasis, superimposed infection not excluded in the appropriate clinical setting. Small left effusion. Electronically Signed   By: Lovey Newcomer M.D.   On: 01/18/2018 15:52        Scheduled Meds: .  stroke: mapping our early stages of recovery book   Does not apply Once  . aspirin EC  81 mg Oral Daily  . atorvastatin  20 mg Oral Daily  . Chlorhexidine Gluconate Cloth  6 each Topical Q0600  . clopidogrel  75 mg Oral Daily  . enoxaparin (LOVENOX) injection  30 mg Subcutaneous Q24H  . mupirocin ointment  1 application Nasal BID   Continuous Infusions: . sodium chloride 125 mL/hr at 01/18/18 1543  . sodium chloride 100 mL/hr at 01/19/18 0900     LOS: 1 day    Time spent: 25 minutes    Edwin Dada, MD [facility] 01/19/2018, 6:25 PM     Pager [phone] --- please page though AMION:  [email or web address] Password [email or web address] If 7PM-7AM, please contact night-coverage

## 2018-01-19 NOTE — Progress Notes (Addendum)
STROKE TEAM PROGRESS NOTE  HPI:( Dr Leonel Ramsay )  [name] is a 77 y.o. male who lives at a SNF and was noted to have altered mental status today.  He has a history of alcohol abuse, bilateral cataracts, diabetes, hard of hearing, hypertension, legally blind, poor vision.  Patient was apparently as stated above brought to the hospital secondary to altered mental status.  Per EMS reports the nursing home was not able to clearly tell a normal baseline however it is stated that he was only aware of himself.  While in the hospital EMS performed an EKG which was noted to have some abnormalities.  While in the ED due to possible STEMI patient did receive a heparin injection of 2800 units.  At this point patient was sent for a CT scan for his altered mental status and showed a subacute left cerebellar infarct.  Fourth ventricle was open.  Is it also showed a small area of low attenuation within the left frontal lobe most compatible with age-indeterminate infarct. LKW: Unknown tpa given?: no, due to unknown last known normal Premorbid modified Rankin scale (mRS): 5 NIHSS 7 INTERVAL HISTORY His [name] social worker is at the bedside.  She checks on his at the ALF. She recounted some of his medical hx. She is concerned he is not getting his meds as prescribed. Interpretor arrived during rounds. States he came to the hospital because he could not see. Interpretor helped obtain hx and shared info from MD including dx, tx and prognosis.  Vitals:   01/18/18 2319 01/19/18 0142 01/19/18 0327 01/19/18 0537  BP: (!) 172/77 (!) 188/79 (!) 166/61 (!) 152/65  Pulse: 85 83 78 76  Resp: 17 16 18 18   Temp: 97.7 F (36.5 C) 97.6 F (36.4 C) 98.1 F (36.7 C) 97.7 F (36.5 C)  TempSrc: Oral Oral Oral Oral  SpO2: 100% 100% 100% 100%  Weight:      Height:        CBC:  Recent Labs  Lab 01/18/18 1526 01/19/18 0339  WBC 12.9* 13.9*  NEUTROABS 8.9*  --   HGB 10.2* 9.9*  HCT 33.7* 32.1*  MCV 80.8 79.7  PLT  291 992    Basic Metabolic Panel:  Recent Labs  Lab 01/18/18 1526 01/19/18 0339  NA 140 144  K 5.9* 4.3  CL 108 116*  CO2 22 21*  GLUCOSE 168* 121*  BUN 50* 40*  CREATININE 3.26* 2.38*  CALCIUM 9.2 9.0   Lipid Panel:     Component Value Date/Time   CHOL 124 01/19/2018 0339   CHOL 199 07/24/2017 1135   TRIG 104 01/19/2018 0339   HDL 45 01/19/2018 0339   HDL 63 07/24/2017 1135   CHOLHDL 2.8 01/19/2018 0339   VLDL 21 01/19/2018 0339   LDLCALC 58 01/19/2018 0339   LDLCALC 90 07/24/2017 1135   HgbA1c:  Lab Results  Component Value Date   HGBA1C 7.1 (H) 01/19/2018   Urine Drug Screen:     Component Value Date/Time   LABOPIA NONE DETECTED 01/18/2018 1817   COCAINSCRNUR NONE DETECTED 01/18/2018 1817   LABBENZ NONE DETECTED 01/18/2018 1817   AMPHETMU NONE DETECTED 01/18/2018 1817   THCU NONE DETECTED 01/18/2018 1817   LABBARB NONE DETECTED 01/18/2018 1817    Alcohol Level     Component Value Date/Time   ETH <10 01/18/2018 1628    IMAGING Ct Head Wo Contrast  Result Date: 01/18/2018 CLINICAL DATA:  Patient with altered mental status. EXAM: CT HEAD WITHOUT  CONTRAST TECHNIQUE: Contiguous axial images were obtained from the base of the skull through the vertex without intravenous contrast. COMPARISON:  Brain CT 11/15/2013. FINDINGS: Brain: Ventricles and sulci are prominent compatible with atrophy. Chronic bilateral thalamic lacunar infarcts. Patchy low attenuation within the left frontal lobe (image 22; series 3). Large area of low attenuation within the left cerebellar hemisphere (image 8; series 3). No mass lesion or mass-effect. Vascular: Unremarkable Skull: Intact. Sinuses/Orbits: Left maxillary sinus mucosal thickening. Mastoid air cells are unremarkable. Postprocedural changes within the orbits. Other: None. IMPRESSION: Large area of low attenuation within the left cerebellar hemisphere most compatible with subacute infarct. Small area of low attenuation within the left  frontal lobe most compatible with age-indeterminate infarct. These results were called by telephone at the time of interpretation on 01/18/2018 at 4:24 pm to Dr. Dorie Rank , who verbally acknowledged these results. Electronically Signed   By: Lovey Newcomer M.D.   On: 01/18/2018 16:26   Mr [name] Head Wo Contrast  Result Date: 01/18/2018 CLINICAL DATA:  Altered mental status. EXAM: MRI HEAD WITHOUT CONTRAST MRA HEAD WITHOUT CONTRAST TECHNIQUE: Multiplanar, multiecho pulse sequences of the brain and surrounding structures were obtained without intravenous contrast. Angiographic images of the head were obtained using MRA technique without contrast. COMPARISON:  Head CT earlier today FINDINGS: MRI HEAD FINDINGS Brain: Left inferior cerebellar infarct is remote. There are patchy acute infarcts that are overall small in size, seen along the bilateral cerebral convexities and at the left corona radiata. The most confluent cortically based infarcts measured 2.4 cm in the right occipital lobe and 2.8 cm in the anterior left frontal lobe. Remote lacunar infarcts in the bilateral thalamus, right caudate head, and pons. Remote hemorrhage in the external capsule on the right with hemosiderin staining. There is moderate ischemic gliosis in the periventricular white matter. No acute hemorrhage, hydrocephalus, or masslike finding. Vascular: Arterial findings below. Preserved dural venous sinus flow voids. Skull and upper cervical spine: No evidence of marrow lesion Sinuses/Orbits: Postoperative globes. Mild mucosal thickening in the left maxillary sinus. MRA HEAD FINDINGS Motion degraded. The carotid, vertebral, and basilar arteries are patent. Probable generalized atheromatous undulation of vessels. No branch occlusion or flow limiting stenosis suspected. No suspected aneurysm. IMPRESSION: 1. Patchy acute infarct along the cerebral convexities and left corona radiata. These are seen in the anterior and posterior circulation and  suggest central embolic disease. 2. Remote left PICA infarct. Multiple remote lacunar infarcts. 3. Remote external capsule hemorrhage on the right. 4. Motion degraded intracranial MRA without acute finding. Electronically Signed   By: Monte Fantasia M.D.   On: 01/18/2018 20:05   Mr Brain Wo Contrast  Result Date: 01/18/2018 CLINICAL DATA:  Altered mental status. EXAM: MRI HEAD WITHOUT CONTRAST MRA HEAD WITHOUT CONTRAST TECHNIQUE: Multiplanar, multiecho pulse sequences of the brain and surrounding structures were obtained without intravenous contrast. Angiographic images of the head were obtained using MRA technique without contrast. COMPARISON:  Head CT earlier today FINDINGS: MRI HEAD FINDINGS Brain: Left inferior cerebellar infarct is remote. There are patchy acute infarcts that are overall small in size, seen along the bilateral cerebral convexities and at the left corona radiata. The most confluent cortically based infarcts measured 2.4 cm in the right occipital lobe and 2.8 cm in the anterior left frontal lobe. Remote lacunar infarcts in the bilateral thalamus, right caudate head, and pons. Remote hemorrhage in the external capsule on the right with hemosiderin staining. There is moderate ischemic gliosis in the periventricular white matter.  No acute hemorrhage, hydrocephalus, or masslike finding. Vascular: Arterial findings below. Preserved dural venous sinus flow voids. Skull and upper cervical spine: No evidence of marrow lesion Sinuses/Orbits: Postoperative globes. Mild mucosal thickening in the left maxillary sinus. MRA HEAD FINDINGS Motion degraded. The carotid, vertebral, and basilar arteries are patent. Probable generalized atheromatous undulation of vessels. No branch occlusion or flow limiting stenosis suspected. No suspected aneurysm. IMPRESSION: 1. Patchy acute infarct along the cerebral convexities and left corona radiata. These are seen in the anterior and posterior circulation and suggest  central embolic disease. 2. Remote left PICA infarct. Multiple remote lacunar infarcts. 3. Remote external capsule hemorrhage on the right. 4. Motion degraded intracranial MRA without acute finding. Electronically Signed   By: Monte Fantasia M.D.   On: 01/18/2018 20:05   Dg Chest Portable 1 View  Result Date: 01/18/2018 CLINICAL DATA:  Altered mental status EXAM: PORTABLE CHEST 1 VIEW COMPARISON:  Chest radiograph 12/04/2017 FINDINGS: Monitoring leads overlie the patient. Stable cardiac and mediastinal contours. Patchy consolidation left lower hemithorax. Small left pleural effusion. No pneumothorax. IMPRESSION: Patchy consolidation left lower hemithorax may be secondary to known bronchiectasis, superimposed infection not excluded in the appropriate clinical setting. Small left effusion. Electronically Signed   By: Lovey Newcomer M.D.   On: 01/18/2018 15:52    PHYSICAL EXAM Frail cachectic malnourished-looking elderly Asian male not in distress. He is hard of hearing. He is legally blind. . Afebrile. Head is nontraumatic. Neck is supple without bruit.    Cardiac exam no murmur or gallop. Lungs are clear to auscultation. Distal pulses are well felt. Neurological Exam : limited due to patient being legally blind, hard of hearing and requiring interpreter. Speech appears clear and fluent. He is able to follow one and two-step commands. He does not blink to threat. Pupils sluggishly reactive. Fundi not visualized. His legally blind bilaterally. Face is symmetric. Tongue midline. Motor system exam able to move all 4 extremities against gravity without focal weakness. Strength is symmetric. Deep tendon reflexes are 2+ symmetric. Plantars downgoing. Sensation appears intact bilaterally. Gait cannot be tested.  ASSESSMENT/PLAN [name] is a 77 y.o. male with history of ETOH use, cataracts., DB, HTN, HLD, HOH, blind, dementia presenting with altered mental status and inability to see the same.    Stroke:   Patchy L  Cerebellar convexity and corona radiata infarcts in setting of previous old L PICA infarct, felt to be likely  Embolic  CT head left cerebellar infarct.  Left frontal lobe infarct age indeterminate  MRI patchy left cerebral convexities and corona radiata infarcts.  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  Multiple older Luan Pulling.  Old right external capsule hemorrhage.  MRA motion degraded.  No acute finding  Carotid Doppler pending  2D Echo pending  Given baseline dementia and blindness, not a good anticoagulation candidate.  Therefore, will not do further aggressive work-up looking for an embolic source.    LDL 58  HgbA1c 7.1  Lovenox 30 mg sq daily for VTE prophylaxis  No antithrombotic prior to admission, received 324 mg aspirin on arrival, now on No antithrombotic. Given mild stroke, recommend aspirin 81 mg and plavix 75 mg daily x 3 weeks, then aspirin alone. Orders adjusted.   Therapy recommendations:  SNF   Disposition:  pending  (he lives in an ALF, has a niece locally)  Sees Dr. Altamease Oiler at the [facility]  Hypertension  Elevated but Stable . Permissive hypertension (OK if < 220/120) but gradually normalize in 5-7 days .  Long-term BP goal normotensive  Hyperlipidemia  Home meds:  Liitor 82 and lovaza, not yet resumed in hospital  LDL 58, goal < 70  Will resume lipitor  Continue statin at discharge  Diabetes type II  HgbA1c 7.1, goal < 7.0  Uncontrolled  Other Stroke Risk Factors  Advanced age  Former Cigarette smoker, quit 27 years ago  ETOH use, hx ETOH abuse    Other Active Problems  Baseline dementia  Legally blind. has had recent surgeries that led to worsening decreased vision   Hyperkalemia  Acute on chronic kidney failure  Hard of hearing  Hospital day # [place], MSN, APRN, ANVP-BC, AGPCNP-BC Advanced Practice Stroke Nurse [place] for Schedule & Pager information 01/19/2018 10:21 AM  I have  personally examined this patient, reviewed notes, independently viewed imaging studies, participated in medical decision making and plan of care.ROS completed by me personally and pertinent positives fully documented  I have made any additions or clarifications directly to the above note. Agree with note above.  The patient is a poor historian and difficult to examine. His chief complaints are related to vision but MRI scan shows bilateral frontal convexity and left subcortical embolic infarcts. Since the patient lives alone and due to his limited vision and hearing and ongoing medication compliance issues I do not believe is a good long-term anticoagulation candidate and hence we will not check TEE or prolonged cardiac monitoring. Recommend aspirin and Plavix for 3 weeks followed by aspirin alone. Continue ongoing stroke workup. Long discussion with the patient, Education officer, museum and the interpreter and answered questions. Greater than 50% time during this 35 minute visit was spent on counseling and coordination of care about his embolic strokes and answered questions.  Antony Contras, MD Medical Director [facility] Stroke Center Pager: [phone] 01/19/2018 2:39 PM  To contact Stroke Continuity provider, please refer to [email or web address] After hours, contact General Neurology

## 2018-01-19 NOTE — Telephone Encounter (Signed)
Pat received a call from [place] [facility].  Patient had a stroke on 01/18/2018 and is in the hospital.  [name] can be reached at [phone] if need be.

## 2018-01-19 NOTE — Progress Notes (Signed)
Preliminary notes--Bilateral carotid duplex exam completed. Bilateral ICAs 1-39% stenosis. Right vertebral artery demonstrates bidirectional flow pattern. Left vertebral artery antegrade flow.  Hongying Landry Mellow (RDMS RVT) 01/19/18 5:47 PM

## 2018-01-20 ENCOUNTER — Ambulatory Visit: Payer: Medicare Other | Admitting: Internal Medicine

## 2018-01-20 ENCOUNTER — Inpatient Hospital Stay (HOSPITAL_COMMUNITY): Payer: Medicare Other

## 2018-01-20 DIAGNOSIS — I351 Nonrheumatic aortic (valve) insufficiency: Secondary | ICD-10-CM

## 2018-01-20 DIAGNOSIS — N183 Chronic kidney disease, stage 3 unspecified: Secondary | ICD-10-CM

## 2018-01-20 DIAGNOSIS — N184 Chronic kidney disease, stage 4 (severe): Secondary | ICD-10-CM

## 2018-01-20 DIAGNOSIS — I10 Essential (primary) hypertension: Secondary | ICD-10-CM

## 2018-01-20 DIAGNOSIS — E1121 Type 2 diabetes mellitus with diabetic nephropathy: Secondary | ICD-10-CM

## 2018-01-20 LAB — BASIC METABOLIC PANEL
ANION GAP: 6 (ref 5–15)
BUN: 28 mg/dL — ABNORMAL HIGH (ref 8–23)
CHLORIDE: 116 mmol/L — AB (ref 98–111)
CO2: 21 mmol/L — AB (ref 22–32)
Calcium: 8.7 mg/dL — ABNORMAL LOW (ref 8.9–10.3)
Creatinine, Ser: 1.86 mg/dL — ABNORMAL HIGH (ref 0.61–1.24)
GFR calc non Af Amer: 33 mL/min — ABNORMAL LOW (ref >60)
GFR, EST AFRICAN AMERICAN: 38 mL/min — AB (ref >60)
Glucose, Bld: 133 mg/dL — ABNORMAL HIGH (ref 70–99)
Potassium: 4.3 mmol/L (ref 3.5–5.1)
SODIUM: 143 mmol/L (ref 135–145)

## 2018-01-20 LAB — CBC
HCT: 31.1 % — ABNORMAL LOW (ref 39.0–52.0)
HEMOGLOBIN: 9.6 g/dL — AB (ref 13.0–17.0)
MCH: 24.3 pg — ABNORMAL LOW (ref 26.0–34.0)
MCHC: 30.9 g/dL (ref 30.0–36.0)
MCV: 78.7 fL (ref 78.0–100.0)
Platelets: 228 10*3/uL (ref 150–400)
RBC: 3.95 MIL/uL — AB (ref 4.22–5.81)
RDW: 12.5 % (ref 11.5–15.5)
WBC: 12.7 10*3/uL — AB (ref 4.0–10.5)

## 2018-01-20 LAB — ECHOCARDIOGRAM COMPLETE
Height: 62 in
WEIGHTICAEL: 1650.8 [oz_av]

## 2018-01-20 MED ORDER — HALOPERIDOL LACTATE 5 MG/ML IJ SOLN
2.5000 mg | Freq: Once | INTRAMUSCULAR | Status: AC
  Administered 2018-01-20: 2.5 mg via INTRAVENOUS
  Filled 2018-01-20: qty 1

## 2018-01-20 NOTE — Progress Notes (Signed)
  Echocardiogram 2D Echocardiogram has been performed.  Timothy [name] 01/20/2018, 3:06 PM

## 2018-01-20 NOTE — Progress Notes (Addendum)
PROGRESS NOTE  Timothy [name] GYI:[id number] DOB: 03-Nov-1940 DOA: 01/18/2018 PCP: [name], PA-C  Brief Narrative: 77 year old man PMH alcohol abuse, blindness, dementia per chart, presented with altered mental status, EMS activated code STEMI in the field.  On arrival this was canceled.  Subsequent evaluation revealed acute left cerebral CVA.  Assessment/Plan Acute cerebellar stroke, corona radiata infarcts, felt to be embolic.  Per neurology not a good anticoagulation candidate secondary to baseline dementia and blindness.  Therefore further work-up to look for embolic source was deferred.  Hemoglobin A1c 7.1, LDL 58.  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 --Continue Lipitor, aspirin and Plavix for 3 weeks followed by aspirin alone. --Dysphagia 2 diet with thin liquids per speech therapist  DM type 2, hemoglobin A1c 7.1%. --CBG stable.  Essential HTN --Permissive hypertension.  Resume amlodipine, carvedilol, hydralazine and Imdur on discharge  Legally blind  Dementia with CKD, diabetic retinopathy --Very confused last night, standing in the bed and feces.  Required soft wrist restraints overnight. --Currently awake, confused, appears to be at baseline.  Remove wrist restraints.  Sport and exercise psychologist.  AKI superimposed on CKD stage III. --AKI resolved. CKD appears to be at baseline  Iron deficiency anemia, anemia of chronic disease --stable. Follow-up as an outpatient.  Chronic bronchiectasis.  Follow-up with pulmonology as an outpatient.  PMH alcohol abuse  Entire exam and history conducted with interpretor at bedside   DVT prophylaxis: enoxparin Code Status: Full Family Communication: none Disposition Plan: SNF    Murray Hodgkins, MD  [facility] Direct contact: [phone] --Via amion app OR  --[email or web address]; password [email or web address]  7PM-7AM contact night coverage as above 01/20/2018, 11:50 AM  LOS: 2 days   Consultants:  Neurology    Procedures:    Antimicrobials:    Interval history/Subjective: Complains of right foot pain. "Why are my wrists tied down?" Per RN was standing on bed last night in feces; very confused.  Objective: Vitals:  Vitals:   01/19/18 1405 01/20/18 0537  BP: (!) 177/73 (!) 189/65  Pulse: 65 (!) 59  Resp: 20 18  Temp: 97.7 F (36.5 C) 97.6 F (36.4 C)  SpO2: 100% 100%    Exam:  Constitutional:  . Appears calm and comfortable sitting in chair ENMT:  . grossly normal hearing  . Lips appear normal Respiratory:  . CTA bilaterally, no w/r/r.  . Respiratory effort normal. Cardiovascular:  . RRR, no m/r/g . No LE extremity edema   . Normal pedal pulses bilaterally Abdomen:  . soft Musculoskeletal:  . RUE, LUE, RLE, LLE   o Moves all extremities to command; tone normal, no atrophy, no abnormal movements Skin:  . No rashes, lesions, ulcers Psychiatric:  . Mental status o Mood, affect difficult to gauge o Orientated to person, hospital . judgment and insight difficult to gauge   I have personally reviewed the following:   Labs:  BMP noted.  BUN trending down, 28.  Creatinine 1.86.  Hemoglobin stable, 9.6.  WBC stable 12.7.  Platelets within normal limits.  Imaging studies:  MRI noted  Prelim carotid u/s noted  Scheduled Meds: .  stroke: mapping our early stages of recovery book   Does not apply Once  . aspirin EC  81 mg Oral Daily  . atorvastatin  20 mg Oral Daily  . Chlorhexidine Gluconate Cloth  6 each Topical Q0600  . clopidogrel  75 mg Oral Daily  . Difluprednate  1 drop Right Eye QID  . enoxaparin (LOVENOX) injection  30 mg Subcutaneous Q24H  . ferrous  gluconate  324 mg Oral Q breakfast  . ketorolac  1 drop Right Eye QID  . mupirocin ointment  1 application Nasal BID  . ofloxacin  1 drop Right Eye QID  . omega-3 acid ethyl esters  2 g Oral BID  . prednisoLONE acetate  1 drop Right Eye QID  . sodium bicarbonate  650 mg Oral BID  . tamsulosin  0.4  mg Oral Daily   Continuous Infusions: . sodium chloride 100 mL/hr at 01/20/18 4239    Principal Problem:   CVA (cerebral vascular accident) ([place]) Active Problems:   DM (diabetes mellitus), type 2 with renal complications (H[facility])   Diabetic retinopathy associated with type 2 diabetes mellitus ([place])   Acute kidney injury superimposed on chronic kidney disease ([place])   Legally blind   Hypertension   CKD (chronic kidney disease), stage III ([place])   LOS: 2 days    Time >35 minutes, review of chart, d/w Dr. Leonie Man, use of interpreter at bedside with patient to examine and elicit history.

## 2018-01-20 NOTE — Progress Notes (Signed)
CSW spoke with [facility]. The nursing director, Peter Congo, states that they are able to accept patient back as long as he can stand and pivot. His caregiver reports that she crushes his medications and gives it to him. CSW left voicemail for patient's contact, [name], to discuss return to ALF with physical therapy.   Timothy Locus Dolph Tavano LCSW [phone]

## 2018-01-20 NOTE — Progress Notes (Signed)
Rehab Admissions Coordinator Note:  Patient was screened by Jhonnie Garner for appropriateness for an Inpatient Acute Rehab Consult.  At this time, we are recommending [facility] if pt is unable to return to his ALF at [facility].   Jhonnie Garner 01/20/2018, 2:19 PM  I can be reached at [phone].

## 2018-01-20 NOTE — Progress Notes (Signed)
Physical Therapy Treatment Patient Details Name: Timothy [name] MRN: [id number] DOB: September 07, 1940 Today's Date: 01/20/2018    History of Present Illness 77 y.o. male with medical history significant of alcohol abuse, hypertension, diabetes, previous poor vision who is a resident of local assisted living facility that was found to be having significant altered mental status. MRI + acute left cerebral hemispheric CVA.    PT Comments    Continuing work on functional mobility and activity tolerance;  Session focused on ambulation and balance, with noted problem solving and motor planning and initiation deficits, as well as already documented visual impairments;   Noted that he will likely be able to return to his ALF, and he has participated well with PT and OT so far this admission; Recommend post-acute rehab to maximize independence and safety with mobility prior to return to his ALF; I believe it is worth considering CIR for intensive therapies -- Will place screen for Rehab Admissions Coordinator to weigh in.   Follow Up Recommendations  Post-acute rehab     Equipment Recommendations  Rolling walker with 5" wheels;3in1 (PT)(to be determined)    Recommendations for Other Services       Precautions / Restrictions Precautions Precautions: Fall Precaution Comments: significant visual impariment    Mobility  Bed Mobility Overal bed mobility: Needs Assistance Bed Mobility: Supine to Sit     Supine to sit: Min guard     General bed mobility comments: Cues to scoot to EOB and get feet to the floor; minguard for safety  Transfers Overall transfer level: Needs assistance Equipment used: 1 person hand held assist;Straight cane Transfers: Sit to/from Stand Sit to Stand: Min assist         General transfer comment: increased assistance with mobility to mod A due to visual impairment an decr steadiness; noted difficulty with directions when going to sit down on toilet in the bathroom;  Noting known visual impairments and also difficulty with problem-solving  Ambulation/Gait Ambulation/Gait assistance: Mod assist Gait Distance (Feet): 20 Feet Assistive device: 1 person hand held assist;Straight cane Gait Pattern/deviations: Step-through pattern;Decreased step length - right;Decreased step length - left     General Gait Details: Erratic step length and step width, at times resmbling festination; numerous losses of balance, tending posteriorly, requiring mod/max assist to prevent fall; incontinent of urine at initial standing    Stairs             Wheelchair Mobility    Modified Rankin (Stroke Patients Only) Modified Rankin (Stroke Patients Only) Pre-Morbid Rankin Score: Slight disability Modified Rankin: Moderately severe disability     Balance Overall balance assessment: Needs assistance   Sitting balance-Leahy Scale: Good       Standing balance-Leahy Scale: Poor Standing balance comment: approaching Fair                            Cognition Arousal/Alertness: Awake/alert Behavior During Therapy: WFL for tasks assessed/performed Overall Cognitive Status: Impaired/Different from baseline Area of Impairment: Problem solving                             Problem Solving: Difficulty sequencing;Requires verbal cues;Requires tactile cues        Exercises      General Comments General comments (skin integrity, edema, etc.): [name], interpreter was present and facilitated communication during session      Pertinent Vitals/Pain Pain Assessment: No/denies pain  Home Living                      Prior Function            PT Goals (current goals can now be found in the care plan section) Acute Rehab PT Goals Patient Stated Goal: none stated, but agreeable to getting OOB to chair PT Goal Formulation: Patient unable to participate in goal setting Time For Goal Achievement: 02/02/18 Potential to Achieve Goals:  Good Progress towards PT goals: Progressing toward goals    Frequency    Min 4X/week      PT Plan Current plan remains appropriate    Co-evaluation              AM-PAC PT "6 Clicks" Daily Activity  Outcome Measure  Difficulty turning over in bed (including adjusting bedclothes, sheets and blankets)?: None Difficulty moving from lying on back to sitting on the side of the bed? : None Difficulty sitting down on and standing up from a chair with arms (e.g., wheelchair, bedside commode, etc,.)?: A Little Help needed moving to and from a bed to chair (including a wheelchair)?: A Little Help needed walking in hospital room?: A Lot Help needed climbing 3-5 steps with a railing? : A Lot 6 Click Score: 18    End of Session Equipment Utilized During Treatment: Gait belt Activity Tolerance: Patient tolerated treatment well Patient left: in chair;with call bell/phone within reach;with chair alarm set;with restraints reapplied Nurse Communication: Mobility status PT Visit Diagnosis: Unsteadiness on feet (R26.81);Other abnormalities of gait and mobility (R26.89);Difficulty in walking, not elsewhere classified (R26.2)     Time: 8466-5993 PT Time Calculation (min) (ACUTE ONLY): 31 min  Charges:  $Gait Training: 8-22 mins $Therapeutic Activity: 8-22 mins                     [name], PT  Acute Rehabilitation Services Pager [phone] Office [phone]    Colletta [place] 01/20/2018, 1:11 PM

## 2018-01-20 NOTE — Plan of Care (Signed)
  Problem: Education: Goal: Knowledge of General Education information will improve Description Including pain rating scale, medication(s)/side effects and non-pharmacologic comfort measures Outcome: Progressing   Problem: Health Behavior/Discharge Planning: Goal: Ability to manage health-related needs will improve Outcome: Progressing   Problem: Clinical Measurements: Goal: Ability to maintain clinical measurements within normal limits will improve Outcome: Progressing Goal: Will remain free from infection Outcome: Progressing Goal: Diagnostic test results will improve Outcome: Progressing Goal: Respiratory complications will improve Outcome: Progressing Goal: Cardiovascular complication will be avoided Outcome: Progressing   Problem: Activity: Goal: Risk for activity intolerance will decrease Outcome: Progressing   Problem: Nutrition: Goal: Adequate nutrition will be maintained Outcome: Progressing   Problem: Coping: Goal: Level of anxiety will decrease Outcome: Progressing   Problem: Elimination: Goal: Will not experience complications related to bowel motility Outcome: Progressing Goal: Will not experience complications related to urinary retention Outcome: Progressing   Problem: Pain Managment: Goal: General experience of comfort will improve Outcome: Progressing   Problem: Safety: Goal: Ability to remain free from injury will improve Outcome: Progressing   Problem: Skin Integrity: Goal: Risk for impaired skin integrity will decrease Outcome: Progressing   Problem: Nutrition: Goal: Risk of aspiration will decrease Outcome: Progressing

## 2018-01-20 NOTE — Progress Notes (Addendum)
STROKE TEAM PROGRESS NOTE    INTERVAL HISTORY His [name] interpreter is at the bedside.  Patient states he is doing slightly better today. His vision remains poor. He became quite agitated and restless last night requiring sedation and is now in restraints  Vitals:   01/19/18 0537 01/19/18 1405 01/20/18 0537 01/20/18 1508  BP: (!) 152/65 (!) 177/73 (!) 189/65 (!) 173/60  Pulse: 76 65 (!) 59 62  Resp: 18 20 18 18   Temp: 97.7 F (36.5 C) 97.7 F (36.5 C) 97.6 F (36.4 C) 98 F (36.7 C)  TempSrc: Oral Oral  Axillary  SpO2: 100% 100% 100% 100%  Weight:      Height:        CBC:  Recent Labs  Lab 01/18/18 1526 01/19/18 0339 01/20/18 0405  WBC 12.9* 13.9* 12.7*  NEUTROABS 8.9*  --   --   HGB 10.2* 9.9* 9.6*  HCT 33.7* 32.1* 31.1*  MCV 80.8 79.7 78.7  PLT 291 252 542    Basic Metabolic Panel:  Recent Labs  Lab 01/19/18 0339 01/20/18 0405  NA 144 143  K 4.3 4.3  CL 116* 116*  CO2 21* 21*  GLUCOSE 121* 133*  BUN 40* 28*  CREATININE 2.38* 1.86*  CALCIUM 9.0 8.7*   Lipid Panel:     Component Value Date/Time   CHOL 124 01/19/2018 0339   CHOL 199 07/24/2017 1135   TRIG 104 01/19/2018 0339   HDL 45 01/19/2018 0339   HDL 63 07/24/2017 1135   CHOLHDL 2.8 01/19/2018 0339   VLDL 21 01/19/2018 0339   LDLCALC 58 01/19/2018 0339   LDLCALC 90 07/24/2017 1135   HgbA1c:  Lab Results  Component Value Date   HGBA1C 7.1 (H) 01/19/2018   Urine Drug Screen:     Component Value Date/Time   LABOPIA NONE DETECTED 01/18/2018 1817   COCAINSCRNUR NONE DETECTED 01/18/2018 1817   LABBENZ NONE DETECTED 01/18/2018 1817   AMPHETMU NONE DETECTED 01/18/2018 1817   THCU NONE DETECTED 01/18/2018 1817   LABBARB NONE DETECTED 01/18/2018 1817    Alcohol Level     Component Value Date/Time   ETH <10 01/18/2018 1628    IMAGING Ct Head Wo Contrast  Result Date: 01/18/2018 CLINICAL DATA:  Patient with altered mental status. EXAM: CT HEAD WITHOUT CONTRAST TECHNIQUE: Contiguous  axial images were obtained from the base of the skull through the vertex without intravenous contrast. COMPARISON:  Brain CT 11/15/2013. FINDINGS: Brain: Ventricles and sulci are prominent compatible with atrophy. Chronic bilateral thalamic lacunar infarcts. Patchy low attenuation within the left frontal lobe (image 22; series 3). Large area of low attenuation within the left cerebellar hemisphere (image 8; series 3). No mass lesion or mass-effect. Vascular: Unremarkable Skull: Intact. Sinuses/Orbits: Left maxillary sinus mucosal thickening. Mastoid air cells are unremarkable. Postprocedural changes within the orbits. Other: None. IMPRESSION: Large area of low attenuation within the left cerebellar hemisphere most compatible with subacute infarct. Small area of low attenuation within the left frontal lobe most compatible with age-indeterminate infarct. These results were called by telephone at the time of interpretation on 01/18/2018 at 4:24 pm to Dr. Dorie Rank , who verbally acknowledged these results. Electronically Signed   By: Lovey Newcomer M.D.   On: 01/18/2018 16:26   Mr [name] Head Wo Contrast  Result Date: 01/18/2018 CLINICAL DATA:  Altered mental status. EXAM: MRI HEAD WITHOUT CONTRAST MRA HEAD WITHOUT CONTRAST TECHNIQUE: Multiplanar, multiecho pulse sequences of the brain and surrounding structures were obtained without intravenous contrast.  Angiographic images of the head were obtained using MRA technique without contrast. COMPARISON:  Head CT earlier today FINDINGS: MRI HEAD FINDINGS Brain: Left inferior cerebellar infarct is remote. There are patchy acute infarcts that are overall small in size, seen along the bilateral cerebral convexities and at the left corona radiata. The most confluent cortically based infarcts measured 2.4 cm in the right occipital lobe and 2.8 cm in the anterior left frontal lobe. Remote lacunar infarcts in the bilateral thalamus, right caudate head, and pons. Remote hemorrhage in  the external capsule on the right with hemosiderin staining. There is moderate ischemic gliosis in the periventricular white matter. No acute hemorrhage, hydrocephalus, or masslike finding. Vascular: Arterial findings below. Preserved dural venous sinus flow voids. Skull and upper cervical spine: No evidence of marrow lesion Sinuses/Orbits: Postoperative globes. Mild mucosal thickening in the left maxillary sinus. MRA HEAD FINDINGS Motion degraded. The carotid, vertebral, and basilar arteries are patent. Probable generalized atheromatous undulation of vessels. No branch occlusion or flow limiting stenosis suspected. No suspected aneurysm. IMPRESSION: 1. Patchy acute infarct along the cerebral convexities and left corona radiata. These are seen in the anterior and posterior circulation and suggest central embolic disease. 2. Remote left PICA infarct. Multiple remote lacunar infarcts. 3. Remote external capsule hemorrhage on the right. 4. Motion degraded intracranial MRA without acute finding. Electronically Signed   By: Monte Fantasia M.D.   On: 01/18/2018 20:05   Mr Brain Wo Contrast  Result Date: 01/18/2018 CLINICAL DATA:  Altered mental status. EXAM: MRI HEAD WITHOUT CONTRAST MRA HEAD WITHOUT CONTRAST TECHNIQUE: Multiplanar, multiecho pulse sequences of the brain and surrounding structures were obtained without intravenous contrast. Angiographic images of the head were obtained using MRA technique without contrast. COMPARISON:  Head CT earlier today FINDINGS: MRI HEAD FINDINGS Brain: Left inferior cerebellar infarct is remote. There are patchy acute infarcts that are overall small in size, seen along the bilateral cerebral convexities and at the left corona radiata. The most confluent cortically based infarcts measured 2.4 cm in the right occipital lobe and 2.8 cm in the anterior left frontal lobe. Remote lacunar infarcts in the bilateral thalamus, right caudate head, and pons. Remote hemorrhage in the  external capsule on the right with hemosiderin staining. There is moderate ischemic gliosis in the periventricular white matter. No acute hemorrhage, hydrocephalus, or masslike finding. Vascular: Arterial findings below. Preserved dural venous sinus flow voids. Skull and upper cervical spine: No evidence of marrow lesion Sinuses/Orbits: Postoperative globes. Mild mucosal thickening in the left maxillary sinus. MRA HEAD FINDINGS Motion degraded. The carotid, vertebral, and basilar arteries are patent. Probable generalized atheromatous undulation of vessels. No branch occlusion or flow limiting stenosis suspected. No suspected aneurysm. IMPRESSION: 1. Patchy acute infarct along the cerebral convexities and left corona radiata. These are seen in the anterior and posterior circulation and suggest central embolic disease. 2. Remote left PICA infarct. Multiple remote lacunar infarcts. 3. Remote external capsule hemorrhage on the right. 4. Motion degraded intracranial MRA without acute finding. Electronically Signed   By: Monte Fantasia M.D.   On: 01/18/2018 20:05   Dg Chest Portable 1 View  Result Date: 01/18/2018 CLINICAL DATA:  Altered mental status EXAM: PORTABLE CHEST 1 VIEW COMPARISON:  Chest radiograph 12/04/2017 FINDINGS: Monitoring leads overlie the patient. Stable cardiac and mediastinal contours. Patchy consolidation left lower hemithorax. Small left pleural effusion. No pneumothorax. IMPRESSION: Patchy consolidation left lower hemithorax may be secondary to known bronchiectasis, superimposed infection not excluded in the appropriate clinical setting. Small  left effusion. Electronically Signed   By: Lovey Newcomer M.D.   On: 01/18/2018 15:52    PHYSICAL EXAM Frail cachectic malnourished-looking elderly Asian male not in distress. He is hard of hearing. He is legally blind. . Afebrile. Head is nontraumatic. Neck is supple without bruit.    Cardiac exam no murmur or gallop. Lungs are clear to auscultation.  Distal pulses are well felt. Neurological Exam : limited due to patient being legally blind, hard of hearing and requiring interpreter. Speech appears clear and fluent. He is able to follow one and two-step commands. He does not blink to threat. Pupils sluggishly reactive. Fundi not visualized. His legally blind bilaterally. Face is symmetric. Tongue midline. Motor system exam able to move all 4 extremities against gravity without focal weakness. Strength is symmetric. Deep tendon reflexes are 2+ symmetric. Plantars downgoing. Sensation appears intact bilaterally. Gait cannot be tested.  ASSESSMENT/PLAN [name] is a 76 y.o. male with history of ETOH use, cataracts., DB, HTN, HLD, HOH, blind, dementia presenting with altered mental status and inability to see the same.    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  Left frontal lobe infarct age indeterminate  MRI patchy left cerebral convexities and corona radiata infarcts.  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  Multiple older Luan Pulling.  Old right external capsule hemorrhage.  MRA motion degraded.  No acute finding  Carotid Doppler pending  2D Echo pending  Given baseline dementia and blindness, not a good anticoagulation candidate.  Therefore, will not do further aggressive work-up looking for an embolic source.    LDL 58  HgbA1c 7.1  Lovenox 30 mg sq daily for VTE prophylaxis  No antithrombotic prior to admission, received 324 mg aspirin on arrival, now on No antithrombotic. Given mild stroke, recommend aspirin 81 mg and plavix 75 mg daily x 3 weeks, then aspirin alone. Orders adjusted.   Therapy recommendations:  SNF   Disposition:  pending  (he lives in an ALF, has a niece locally)  Sees Dr. Altamease Oiler at the [facility]  Hypertension  Elevated but Stable . Permissive hypertension (OK if < 220/120) but  gradually normalize in 5-7 days . Long-term BP goal normotensive  Hyperlipidemia  Home meds:  Liitor 82 and lovaza, not yet resumed in hospital  LDL 58, goal < 70  Will resume lipitor  Continue statin at discharge  Diabetes type II  HgbA1c 7.1, goal < 7.0  Uncontrolled  Other Stroke Risk Factors  Advanced age  Former Cigarette smoker, quit 27 years ago  ETOH use, hx ETOH abuse    Other Active Problems  Baseline dementia  Legally blind. has had recent surgeries that led to worsening decreased vision   Hyperkalemia  Acute on chronic kidney failure  Hard of hearing  Hospital day # 2    The patient is a poor historian and difficult to examine. His chief complaints are related to vision but MRI scan shows bilateral frontal convexity and left subcortical embolic infarcts. Since the patient lives alone and due to his limited vision and hearing and ongoing medication compliance issues I do not believe that he is a good long-term anticoagulation candidate and hence we will not check TEE or prolonged cardiac monitoring. Recommend aspirin and Plavix for 3 weeks followed by aspirin alone.  . Long discussion with the patient, Dr. Sarajane Jews  and the  interpreter and answered questions. Greater than 50% time during this 25 minute visit was spent on counseling and coordination of care about his embolic strokes and answered questions. ADDENDUM : 2DEcho is unremarkable. Stroke team will sign off. Call for questions. Antony Contras, MD Medical Director [facility] Pager: [phone] 01/20/2018 3:14 PM  To contact Stroke Continuity provider, please refer to [email or web address] After hours, contact General Neurology

## 2018-01-20 NOTE — Telephone Encounter (Signed)
Thanks for letting us know. BLI

## 2018-01-20 NOTE — Progress Notes (Signed)
CSW attempted to contact [facility] again.  Timothy Locus Chianna Spirito LCSW [phone]

## 2018-01-21 DIAGNOSIS — N189 Chronic kidney disease, unspecified: Secondary | ICD-10-CM

## 2018-01-21 DIAGNOSIS — N179 Acute kidney failure, unspecified: Secondary | ICD-10-CM

## 2018-01-21 MED ORDER — CLOPIDOGREL BISULFATE 75 MG PO TABS: 75.0000 mg | ORAL_TABLET | Freq: Every day | ORAL | Status: DC

## 2018-01-21 MED ORDER — HYDRALAZINE HCL 20 MG/ML IJ SOLN
5.0000 mg | Freq: Four times a day (QID) | INTRAMUSCULAR | Status: DC | PRN
  Administered 2018-01-21: 5 mg via INTRAVENOUS
  Filled 2018-01-21: qty 1

## 2018-01-21 NOTE — Progress Notes (Signed)
[facility] SN[facility] has insurance approval and a bed available when patient is ready for discharge. Timothy [name] signed his admission paperwork.   [name] LCSW [phone]

## 2018-01-21 NOTE — Progress Notes (Signed)
  Speech Language Pathology Treatment: Dysphagia  Patient Details Name: Timothy [name] MRN: [id number] DOB: [birth year] Today's Date: 01/21/2018 Time: 0071-2197 SLP Time Calculation (min) (ACUTE ONLY): 15 min  Assessment / Plan / Recommendation Clinical Impression  Pt was seen with interpreter Timothy [name]) present. He says that his primary complaint is his visual deficits, and that meals have been going well. The interpreter says that the pt's speech is clear and easily intelligible today, improved even from previous date. Pt ate food and drink from his breakfast tray, with SLP modifying the environment to facilitate self-feeding given visual impairments. Min cues were provided for smaller boluses, which may be at least in part related to inability to clearly see what is on his spoon. Min cues were also needed for slower pacing though, but with no signs of aspiration. His sustained attention was appropriate for task and he followed commands well. Recommend to continue with Dys 2 diet, thin liquids, which is most consistent with pt's description of baseline diet. Given improvement observed (and reported) of cognitive/communication skills, it appears as though this may be approaching more of his baseline as well per chart review. Recommend additional SLP f/u be done at next level of care.   HPI HPI: Pt is a 77 yo male who presented from ALF with AMS. CT Head was concerning for large area of low attenuation in the L cerebellar hemisphere; MRI showed patchy acute infarct along the cerebral convexities and left corona radiata. CXR revealed a LLL consolidation. PMH: legally blind, HTN, HOH, DM, bilateral cateracts, alcohol abuse      SLP Plan  All goals met       Recommendations  Diet recommendations: Dysphagia 2 (fine chop);Thin liquid Liquids provided via: Cup;Straw Medication Administration: Whole meds with liquid Supervision: Staff to assist with self feeding Compensations: Slow rate;Small  sips/bites Postural Changes and/or Swallow Maneuvers: Seated upright 90 degrees                Oral Care Recommendations: Oral care BID Follow up Recommendations: 24 hour supervision/assistance;Skilled Nursing facility SLP Visit Diagnosis: Dysphagia, unspecified (R13.10) Plan: All goals met       GO                Timothy [name] 01/21/2018, 10:59 AM  Timothy [name], M.A. [name] [facility] Environmental education officer [phone] Office [phone]

## 2018-01-21 NOTE — Care Management Note (Signed)
Case Management Note  Patient Details  Name: Timothy [name] MRN: [id number] Date of Birth: [birth year]  Subjective/Objective:    Admitted with AMS, hx of  dementia, legally blind, HTN, DM, CKD and anemia. From [facility]F.   [place] "Ebbie Ridge Ksor ([phone]     PCP: Domenica Fail  Action/Plan: Transition to SNF when medically stable. CSW managing disposition to facility....NCM will continue to monitor for needs.  Expected Discharge Date:                  Expected Discharge Plan:  SNF  In-House Referral:  Clinical Social Work  Discharge planning Services  CM Consult  Post Acute Care Choice:    Choice offered to:     DME Arranged:   N/A DME Agency:   N/A  HH Arranged:   N/A HH Agency:   N/A  Status of Service:  Completed, signed off  If discussed at [place] of Stay Meetings, dates discussed:    Additional Comments:  Sharin Mons, RN 01/21/2018, 2:08 PM

## 2018-01-21 NOTE — Progress Notes (Signed)
[facility] came to pick up the patient but his BO was high 221/66. Staff check multiple times his BP but SBP  is still in 200s. MD notified. Will continue to monitor.

## 2018-01-21 NOTE — Discharge Summary (Addendum)
Physician Discharge Summary  Zakariah Urwin EGB:[id number] DOB: Nov 13, 1940 DOA: 01/18/2018  PCP: [name], PA-C  Admit date: 01/18/2018 Discharge date: 01/22/2018  Recommendations for Outpatient Follow-up:   Discharge was held 9/5 for SBP >220. Uneventful night. BP better 9/6. Restart anti-hypertensives. No change in condition. Transfer to SNF today.  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 Per neurology not a good anticoagulation candidate secondary to baseline dementia and blindness.  Therefore further work-up to look for embolic source was deferred.  Hemoglobin A1c 7.1, LDL 58.  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 --Continue Lipitor, aspirin and Plavix for 3 weeks followed by aspirin alone. --Dysphagia 2 diet with thin liquids per speech therapist  DM type 2, hemoglobin A1c 7.1%. --CBG stable as inpatient.  Not on any medications currently.  Blood sugars well controlled with diet.  Follow-up as an outpatient.     Contact information for follow-up providers    [name], PA-C. Schedule an appointment as soon as possible for a visit in 2 week(s).   Specialty:  Physician Assistant Contact information: [place] [address] [phone]        Garvin Fila, MD. Schedule an appointment as soon as possible for a visit.   Specialties:  Neurology, Radiology Why:  Office will contact with appointment Contact information: [place] [address] [phone]            Contact information for after-discharge care    Destination    [facility] .   Service:  Skilled Nursing Contact information: [place] [phone]                   Discharge Diagnoses:  1. 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  2. DM type 2 3. Essential HTN 4. Legally blind 5. Dementia with behavioral  disturbance 6. AKI superimposed on CKD stage III. 7. Iron deficiency anemia, anemia of chronic disease  Discharge Condition: improved Disposition: SNF  Diet recommendation: Diet recommendations: Dysphagia 2 (fine chop);Thin liquid Liquids provided via: Cup;Straw Medication Administration: Whole meds with liquid Supervision: Staff to assist with self feeding Compensations: Slow rate;Small sips/bites Postural Changes and/or Swallow Maneuvers: Seated upright 90 degrees       Filed Weights   01/18/18 1523 01/18/18 2038  Weight: 46.9 kg 46.8 kg    History of present illness:  77 year old man PMH alcohol abuse, blindness, dementia per chart, presented with altered mental status, EMS activated code STEMI in the field.  On arrival this was canceled.  Subsequent evaluation revealed acute left cerebral CVA.  Hospital Course:  Patient was admitted, further evaluation for stroke was conducted.  He was seen by neurology with recommendations as detailed below.  Condition stabilized.  Hospitalization was uncomplicated.  Skilled rehab is recommended and caseworker agreed.  Patient has no involved family.  On the day of discharge I discussed the case with his caseworker who was appreciative of the care and she confirmed there was no involved family members.  Discharge was held 9/6 for SBP >220. Uneventful night. BP better 9/6. Restart anti-hypertensives. No change in condition. Transfer to SNF today.  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 Per neurology not a good anticoagulation candidate secondary to baseline dementia and blindness.  Therefore further work-up to look for embolic source was deferred.  Hemoglobin A1c 7.1, LDL 58.  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 --Continue Lipitor,  aspirin and Plavix for 3 weeks followed by aspirin alone. --Dysphagia 2 diet with thin liquids per speech therapist  DM type 2, hemoglobin A1c 7.1%. --CBG stable as inpatient.  Not on any  medications currently.  Blood sugars well controlled with diet.  Follow-up as an outpatient.  Essential HTN --Permissive hypertension during inpatient stay.  Resumed amlodipine, carvedilol, hydralazine and Imdur on discharge  Legally blind  Dementia with behavioral disturbance yesterday.  Resolved now.  Awake and alert and follows commands. --Stable.  AKI superimposed on CKD stage III. --AKI resolved. CKD appears to be at baseline  Iron deficiency anemia, anemia of chronic disease --stable. Follow-up as an outpatient.  Chronic bronchiectasis.  Follow-up with pulmonology as an outpatient.  PMH alcohol abuse  Consultants:  Neurology   Procedures:    9/6 S: no events overnight O: Afebrile, 98.3, 16, 66, 182/59, Appears calm, comfortable.  Alert.  Cardiovascular regular rate and rhythm.  No murmur, rub or gallop.  Respiratory clear to auscultation bilaterally.  No wheezes, rales or rhonchi.  Normal respiratory effort. Moves all extremities well with excellent tone.  No new neurologic changes noted. Assessment/plan Transfer to skilled nursing facility today.  9/5 assessment: S: feels ok, some pain in knee Interpreter at bedside.  O: Vitals:  Vitals:   01/21/18 2326 01/22/18 0545  BP: (!) 150/80 (!) 182/59  Pulse:  66  Resp:  16  Temp:  98.3 F (36.8 C)  SpO2:  96%    Constitutional:  . Appears calm and comfortable Respiratory:  . CTA bilaterally, no w/r/r.  . Respiratory effort normal.  Cardiovascular:  . RRR, no m/r/g . No LE extremity edema   Musculoskeletal:  . RUE, LUE, RLE, LLE   o strength and tone normal, no atrophy, no abnormal movements Neurologic:  . Grossly non-focal; vision not tested Psychiatric:  . Mental status o Mood, affect appropriate    Discharge Instructions  Discharge Instructions    Ambulatory referral to Neurology   Complete by:  As directed    An appointment is requested in approximately: 4 weeks     Allergies  as of 01/22/2018   No Known Allergies     Medication List    STOP taking these medications   oxyCODONE 5 MG immediate release tablet Commonly known as:  Oxy IR/ROXICODONE     TAKE these medications   albuterol 108 (90 Base) MCG/ACT inhaler Commonly known as:  PROVENTIL HFA;VENTOLIN HFA Inhale 2 puffs into the lungs every 6 (six) hours as needed for wheezing or shortness of breath.   amLODipine 10 MG tablet Commonly known as:  NORVASC Take 1 tablet (10 mg total) by mouth daily.   aspirin 81 MG chewable tablet Chew 1 tablet (81 mg total) by mouth daily.   atorvastatin 20 MG tablet Commonly known as:  LIPITOR Take 1 tablet (20 mg total) by mouth daily.   carvedilol 6.25 MG tablet Commonly known as:  COREG Take 1 tablet (6.25 mg total) by mouth 2 (two) times daily with a meal.   clopidogrel 75 MG tablet Commonly known as:  PLAVIX Take 1 tablet (75 mg total) by mouth daily. Take through 9/23, then take aspirin alone.   DUREZOL 0.05 % Emul Generic drug:  Difluprednate Place 1 drop into the right eye 4 (four) times daily.   ferrous gluconate 324 MG tablet Commonly known as:  FERGON Take 1 tablet (324 mg total) by mouth daily with breakfast.   FLUTTER Devi 1 Device by Does not apply  route as directed.   folic acid 1 MG tablet Commonly known as:  FOLVITE Take 1 tablet (1 mg total) by mouth daily.   hydrALAZINE 10 MG tablet Commonly known as:  APRESOLINE Take 1 tablet (10 mg total) by mouth 3 (three) times daily.   isosorbide mononitrate 30 MG 24 hr tablet Commonly known as:  IMDUR Take 1 tablet (30 mg total) by mouth daily.   ketorolac 0.4 % Soln Commonly known as:  ACULAR Place 1 drop into the right eye 4 (four) times daily.   ofloxacin 0.3 % ophthalmic solution Commonly known as:  OCUFLOX Place 1 drop into the right eye 4 (four) times daily.   omega-3 acid ethyl esters 1 g capsule Commonly known as:  LOVAZA Take 2 capsules (2 g total) by mouth 2 (two) times  daily.   prednisoLONE acetate 1 % ophthalmic suspension Commonly known as:  PRED FORTE Place 1 drop into the right eye 4 (four) times daily.   sodium bicarbonate 650 MG tablet Take 1 tablet (650 mg total) by mouth 2 (two) times daily.   tamsulosin 0.4 MG Caps capsule Commonly known as:  FLOMAX Take 1 capsule (0.4 mg total) by mouth daily.   thiamine 100 MG tablet Commonly known as:  VITAMIN B-1 Take 100 mg by mouth daily.      No Known Allergies  The results of significant diagnostics from this hospitalization (including imaging, microbiology, ancillary and laboratory) are listed below for reference.    Significant Diagnostic Studies: Ct Head Wo Contrast  Result Date: 01/18/2018 CLINICAL DATA:  Patient with altered mental status. EXAM: CT HEAD WITHOUT CONTRAST TECHNIQUE: Contiguous axial images were obtained from the base of the skull through the vertex without intravenous contrast. COMPARISON:  Brain CT 11/15/2013. FINDINGS: Brain: Ventricles and sulci are prominent compatible with atrophy. Chronic bilateral thalamic lacunar infarcts. Patchy low attenuation within the left frontal lobe (image 22; series 3). Large area of low attenuation within the left cerebellar hemisphere (image 8; series 3). No mass lesion or mass-effect. Vascular: Unremarkable Skull: Intact. Sinuses/Orbits: Left maxillary sinus mucosal thickening. Mastoid air cells are unremarkable. Postprocedural changes within the orbits. Other: None. IMPRESSION: Large area of low attenuation within the left cerebellar hemisphere most compatible with subacute infarct. Small area of low attenuation within the left frontal lobe most compatible with age-indeterminate infarct. These results were called by telephone at the time of interpretation on 01/18/2018 at 4:24 pm to Dr. Dorie Rank , who verbally acknowledged these results. Electronically Signed   By: Lovey Newcomer M.D.   On: 01/18/2018 16:26   Mr [name] Head Wo Contrast  Result Date:  01/18/2018 CLINICAL DATA:  Altered mental status. EXAM: MRI HEAD WITHOUT CONTRAST MRA HEAD WITHOUT CONTRAST TECHNIQUE: Multiplanar, multiecho pulse sequences of the brain and surrounding structures were obtained without intravenous contrast. Angiographic images of the head were obtained using MRA technique without contrast. COMPARISON:  Head CT earlier today FINDINGS: MRI HEAD FINDINGS Brain: Left inferior cerebellar infarct is remote. There are patchy acute infarcts that are overall small in size, seen along the bilateral cerebral convexities and at the left corona radiata. The most confluent cortically based infarcts measured 2.4 cm in the right occipital lobe and 2.8 cm in the anterior left frontal lobe. Remote lacunar infarcts in the bilateral thalamus, right caudate head, and pons. Remote hemorrhage in the external capsule on the right with hemosiderin staining. There is moderate ischemic gliosis in the periventricular white matter. No acute hemorrhage, hydrocephalus, or masslike  finding. Vascular: Arterial findings below. Preserved dural venous sinus flow voids. Skull and upper cervical spine: No evidence of marrow lesion Sinuses/Orbits: Postoperative globes. Mild mucosal thickening in the left maxillary sinus. MRA HEAD FINDINGS Motion degraded. The carotid, vertebral, and basilar arteries are patent. Probable generalized atheromatous undulation of vessels. No branch occlusion or flow limiting stenosis suspected. No suspected aneurysm. IMPRESSION: 1. Patchy acute infarct along the cerebral convexities and left corona radiata. These are seen in the anterior and posterior circulation and suggest central embolic disease. 2. Remote left PICA infarct. Multiple remote lacunar infarcts. 3. Remote external capsule hemorrhage on the right. 4. Motion degraded intracranial MRA without acute finding. Electronically Signed   By: Monte Fantasia M.D.   On: 01/18/2018 20:05   Mr Brain Wo Contrast  Result Date:  01/18/2018 CLINICAL DATA:  Altered mental status. EXAM: MRI HEAD WITHOUT CONTRAST MRA HEAD WITHOUT CONTRAST TECHNIQUE: Multiplanar, multiecho pulse sequences of the brain and surrounding structures were obtained without intravenous contrast. Angiographic images of the head were obtained using MRA technique without contrast. COMPARISON:  Head CT earlier today FINDINGS: MRI HEAD FINDINGS Brain: Left inferior cerebellar infarct is remote. There are patchy acute infarcts that are overall small in size, seen along the bilateral cerebral convexities and at the left corona radiata. The most confluent cortically based infarcts measured 2.4 cm in the right occipital lobe and 2.8 cm in the anterior left frontal lobe. Remote lacunar infarcts in the bilateral thalamus, right caudate head, and pons. Remote hemorrhage in the external capsule on the right with hemosiderin staining. There is moderate ischemic gliosis in the periventricular white matter. No acute hemorrhage, hydrocephalus, or masslike finding. Vascular: Arterial findings below. Preserved dural venous sinus flow voids. Skull and upper cervical spine: No evidence of marrow lesion Sinuses/Orbits: Postoperative globes. Mild mucosal thickening in the left maxillary sinus. MRA HEAD FINDINGS Motion degraded. The carotid, vertebral, and basilar arteries are patent. Probable generalized atheromatous undulation of vessels. No branch occlusion or flow limiting stenosis suspected. No suspected aneurysm. IMPRESSION: 1. Patchy acute infarct along the cerebral convexities and left corona radiata. These are seen in the anterior and posterior circulation and suggest central embolic disease. 2. Remote left PICA infarct. Multiple remote lacunar infarcts. 3. Remote external capsule hemorrhage on the right. 4. Motion degraded intracranial MRA without acute finding. Electronically Signed   By: Monte Fantasia M.D.   On: 01/18/2018 20:05   Dg Chest Portable 1 View  Result Date:  01/18/2018 CLINICAL DATA:  Altered mental status EXAM: PORTABLE CHEST 1 VIEW COMPARISON:  Chest radiograph 12/04/2017 FINDINGS: Monitoring leads overlie the patient. Stable cardiac and mediastinal contours. Patchy consolidation left lower hemithorax. Small left pleural effusion. No pneumothorax. IMPRESSION: Patchy consolidation left lower hemithorax may be secondary to known bronchiectasis, superimposed infection not excluded in the appropriate clinical setting. Small left effusion. Electronically Signed   By: Lovey Newcomer M.D.   On: 01/18/2018 15:52    Microbiology: Recent Results (from the past 240 hour(s))  MRSA PCR Screening     Status: Abnormal   Collection Time: 01/18/18  8:47 PM  Result Value Ref Range Status   MRSA by PCR POSITIVE (A) NEGATIVE Final    Comment:        The GeneXpert MRSA Assay (FDA approved for NASAL specimens only), is one component of a comprehensive MRSA colonization surveillance program. It is not intended to diagnose MRSA infection nor to guide or monitor treatment for MRSA infections. RESULT CALLED TO, READ BACK BY AND  VERIFIED WITH: Dorena Bodo RN 01/18/18 2325 JDW Performed at [facility], [place] [address] [place], [place] [address]      Labs: Basic Metabolic Panel: Recent Labs  Lab 01/18/18 1526 01/19/18 0339 01/20/18 0405  NA 140 144 143  K 5.9* 4.3 4.3  CL 108 116* 116*  CO2 22 21* 21*  GLUCOSE 168* 121* 133*  BUN 50* 40* 28*  CREATININE 3.26* 2.38* 1.86*  CALCIUM 9.2 9.0 8.7*   Liver Function Tests: Recent Labs  Lab 01/18/18 1526 01/19/18 0339  AST 30 21  ALT 14 11  ALKPHOS 83 78  BILITOT 0.3 0.8  PROT 7.3 6.8  ALBUMIN 3.5 3.4*   CBC: Recent Labs  Lab 01/18/18 1526 01/19/18 0339 01/20/18 0405  WBC 12.9* 13.9* 12.7*  NEUTROABS 8.9*  --   --   HGB 10.2* 9.9* 9.6*  HCT 33.7* 32.1* 31.1*  MCV 80.8 79.7 78.7  PLT 291 252 228   Cardiac Enzymes: Recent Labs  Lab 01/18/18 1526  TROPONINI <0.03    Principal Problem:    CVA (cerebral vascular accident) ([place]) Active Problems:   DM (diabetes mellitus), type 2 with renal complications ([facility])   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

## 2018-01-21 NOTE — Clinical Social Work Note (Signed)
Clinical Social Work Assessment  Patient Details  Name: Timothy [name] MRN: [id number] Date of Birth: [birth year]  Date of referral:  01/21/18               Reason for consult:  Facility Placement                Permission sought to share information with:  Facility Sport and exercise psychologist, Family Supports Permission granted to share information::  Yes, Verbal Permission Granted  Name::     Academic librarian::  SNFs  Relationship::  Agency representative  Contact Information:  [phone]  Housing/Transportation Living arrangements for the past 2 months:  [place] of Information:  Case Freight forwarder, Facility Patient Interpreter Needed:  [place] Criminal Activity/Legal Involvement Pertinent to Current Situation/Hospitalization:  No - Comment as needed Significant Relationships:  Other Family Members, Community Support Lives with:  Facility Resident Do you feel safe going back to the place where you live?  No Need for family participation in patient care:  Yes (Comment)  Care giving concerns:  CSW received consult for possible SNF placement at time of discharge. Patient uses and interpretor but he is disoriented. CSW spoke with [name], community case Freight forwarder, regarding PT recommendation of SNF placement at time of discharge. Patient resides at [facility], but [name] reported that the facility is not taking care of him like they should. Patient expressed understanding of PT recommendation and is agreeable to SNF placement at time of discharge. CSW to continue to follow and assist with discharge planning needs.   Social Worker assessment / plan:  CSW spoke with [name] concerning possibility of rehab at [facility] before returning home.  Employment status:  Retired Nurse, adult, Medicaid [place] PT Recommendations:  [place] / Referral to community resources:  [place]  Patient/Family's Response  to care:  [name] recognizes need for rehab before returning home and is agreeable to a SNF in [place]. When given the SNF offers, she reported preference for [facility] since it is close to her. She is aware that they do not have an interpretor and is able to schedule help with that. She reports that patient only has a niece that works in the hospital but she is not willing to help with patient's care.   Patient/Family's Understanding of and Emotional Response to Diagnosis, Current Treatment, and Prognosis:  Patient/family is realistic regarding therapy needs and expressed being hopeful for SNF placement. Liana expressed understanding of CSW role and discharge process as well as medical condition. No questions/concerns about plan or treatment.    Emotional Assessment Appearance:  Appears stated age Attitude/Demeanor/Rapport:  Unable to Assess Affect (typically observed):  Unable to Assess Orientation:  (Disoriented) Alcohol / Substance use:  Not Applicable Psych involvement (Current and /or in the community):  No (Comment)  Discharge Needs  Concerns to be addressed:  Care Coordination Readmission within the last 30 days:  No Current discharge risk:  None Barriers to Discharge:  Continued Medical Work up   [employer], [place] 01/21/2018, 8:34 AM

## 2018-01-21 NOTE — Progress Notes (Signed)
Patient was discharged to nursing home ([facility]) by MD order; discharged instructions review and sent to facility with care notes; IV DIC; skin intact; patient will be transported to facility via [place]. Facility was called and report was given to the nurse who is going to receive the patient.

## 2018-01-21 NOTE — Progress Notes (Signed)
[facility] will start insurance pre-authorization process.  Percell Locus [place] [facility] LCSW [phone]

## 2018-01-21 NOTE — [place] FL2 (Signed)
[place] MEDICAID FL2 LEVEL OF CARE SCREENING TOOL     IDENTIFICATION  Patient Name: Timothy [name] Birthdate: 02-Dec-1940 Sex: male Admission Date (Current Location): 01/18/2018  [facility] and [place] Number:  Herbalist and Address:  [facility], [place] [address], [place], [place] [address]      Provider Number: [id number]  Attending Physician Name and Address:  Samuella Cota, MD  Relative Name and Phone Number:  [name] [phone]    Current Level of Care: Hospital Recommended Level of Care: [place] Prior Approval Number:    Date Approved/Denied:   PASRR Number: [id number]  Discharge Plan: SNF    Current Diagnoses: Patient Active Problem List   Diagnosis Date Noted  . CKD (chronic kidney disease), stage III ([place]) 01/20/2018  . CVA (cerebral vascular accident) ([place]) 01/18/2018  . 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 Hypertension 06/26/2017  . Acute kidney injury superimposed on chronic kidney disease ([place]) 06/25/2017  . 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 Left retinal detachment   . 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 Bilateral cataracts 04/08/2017  . DM (diabetes mellitus), type 2 with renal complications ([place])   . Alcohol abuse   . Poor vision   . Anemia of chronic disease 02/13/2017  . 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 Epigastric pain 02/11/2017  . Fall at home 11/16/2013  . Closed TBI (traumatic brain injury) ([place]) 11/16/2013  . SDH (subdural hematoma) ([facility]) 11/15/2013    Orientation RESPIRATION BLADDER Height & Weight     (Disoriented x4)  Normal Incontinent Weight: 46.8 kg Height:  5\' 2"  (157.5 cm)  BEHAVIORAL SYMPTOMS/MOOD NEUROLOGICAL BOWEL NUTRITION STATUS      Continent Diet  AMBULATORY STATUS COMMUNICATION OF NEEDS Skin   Limited Assist Verbally Normal                       Personal Care Assistance Level of Assistance  Bathing, Feeding, Dressing Bathing  Assistance: Limited assistance Feeding assistance: Limited assistance Dressing Assistance: Limited assistance     Functional Limitations Info  Sight, Hearing, Speech Sight Info: Impaired Hearing Info: Adequate Speech Info: (Speaks [place])    [place]  PT (By licensed PT), OT (By licensed OT)     PT Frequency: 5x/week OT Frequency: 3x/week            Contractures Contractures Info: Not present    Additional Factors Info  Isolation Precautions Code Status Info: Full Allergies Info: NKA     Isolation Precautions Info: 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  stroke: mapping our early stages of recovery book   Does not apply Once Elwyn Reach, MD   Stopped at 01/19/18 1002  . 0.9 %  sodium chloride infusion   Intravenous Continuous Elwyn Reach, MD 100 mL/hr at 01/21/18 0301    . acetaminophen (TYLENOL) tablet 650 mg  650 mg Oral Q4H PRN Elwyn Reach, MD   650 mg at 01/21/18 0000   Or  . acetaminophen (TYLENOL) solution 650 mg  650 mg Per Tube Q4H PRN Elwyn Reach, MD       Or  . acetaminophen (TYLENOL) suppository 650 mg  650 mg Rectal Q4H PRN Elwyn Reach, MD      .  aspirin EC tablet 81 mg  81 mg Oral Daily Donzetta Starch, NP   81 mg at 01/20/18 1120  . atorvastatin (LIPITOR) tablet 20 mg  20 mg Oral Daily Burnetta Sabin L, NP   20 mg at 01/20/18 1121  . Chlorhexidine Gluconate Cloth 2 % PADS 6 each  6 each Topical Q0600 Elwyn Reach, MD   6 each at 01/20/18 1127  . clopidogrel (PLAVIX) tablet 75 mg  75 mg Oral Daily Burnetta Sabin L, NP   75 mg at 01/20/18 1121  . Difluprednate 0.05 % EMUL 1 drop  1 drop Right Eye QID Danford, Christopher P, MD      . enoxaparin (LOVENOX) injection 30 mg  30 mg Subcutaneous Q24H Gala Romney L, MD   30 mg at  01/21/18 0000  . ferrous gluconate (FERGON) tablet 324 mg  324 mg Oral Q breakfast Edwin Dada, MD   324 mg at 01/21/18 0811  . ketorolac (ACULAR) 0.5 % ophthalmic solution 1 drop  1 drop Right Eye QID Edwin Dada, MD   1 drop at 01/21/18 7673  . mupirocin ointment (BACTROBAN) 2 % 1 application  1 application Nasal BID Elwyn Reach, MD   1 application at 41/93/79 2359  . ofloxacin (OCUFLOX) 0.3 % ophthalmic solution 1 drop  1 drop Right Eye QID Edwin Dada, MD   1 drop at 01/21/18 0001  . omega-3 acid ethyl esters (LOVAZA) capsule 2 g  2 g Oral BID Edwin Dada, MD   2 g at 01/21/18 0000  . prednisoLONE acetate (PRED FORTE) 1 % ophthalmic suspension 1 drop  1 drop Right Eye QID Edwin Dada, MD   1 drop at 01/21/18 0001  . senna-docusate (Senokot-S) tablet 1 tablet  1 tablet Oral QHS PRN Gala Romney L, MD      . sodium bicarbonate tablet 650 mg  650 mg Oral BID Edwin Dada, MD   650 mg at 01/21/18 0000  . tamsulosin (FLOMAX) capsule 0.4 mg  0.4 mg Oral Daily Danford, Suann Larry, MD   0.4 mg at 01/20/18 1125     Discharge Medications: Please see discharge summary for a list of discharge medications.  Relevant Imaging Results:  Relevant Lab Results:   Additional Information SN: [id number]  Benard Halsted, LCSWA

## 2018-01-21 NOTE — Progress Notes (Signed)
Per MD order patient discharge was canceled. IV team was called to restart IV and patient will received Hydralazine IV per MD order. Will continue to monitor.

## 2018-01-21 NOTE — Clinical Social Work Placement (Signed)
   CLINICAL SOCIAL WORK PLACEMENT  NOTE  Date:  01/21/2018  Patient Details  Name: Timothy [name] MRN: [id number] Date of Birth: 1940/09/01  Clinical Social Work is seeking post-discharge placement for this patient at the [place] level of care (*CSW will initial, date and re-position this form in  chart as items are completed):  Yes   Patient/family provided with [place] Work Department's list of facilities offering this level of care within the geographic area requested by the patient (or if unable, by the patient's family).  Yes   Patient/family informed of their freedom to choose among providers that offer the needed level of care, that participate in Medicare, Medicaid or managed care program needed by the patient, have an available bed and are willing to accept the patient.  Yes   Patient/family informed of 's ownership interest in [facility] and [facility], as well as of the fact that they are under no obligation to receive care at these facilities.  PASRR submitted to EDS on 01/21/18     PASRR number received on 01/21/18     Existing PASRR number confirmed on       FL2 transmitted to all facilities in geographic area requested by pt/family on 01/21/18     FL2 transmitted to all facilities within larger geographic area on       Patient informed that his/her managed care company has contracts with or will negotiate with certain facilities, including the following:        Yes   Patient/family informed of bed offers received.  Patient chooses bed at       Physician recommends and patient chooses bed at      Patient to be transferred to [facility] on 01/21/18.  Patient to be transferred to facility by PTAR     Patient family notified on 01/21/18 of transfer.  Name of family member notified:  Liana     PHYSICIAN Please sign FL2     Additional Comment:     _______________________________________________ Alberteen Sam, LCSW 01/21/2018, 3:26 PM

## 2018-01-21 NOTE — Progress Notes (Signed)
Patient will DC to: [facility] Anticipated DC date: 01/21/18 Family notified: Museum/gallery conservator by: PTAR 4:30pm   Per MD patient ready for DC to [facility]. RN, patient, patient's family, and facility notified of DC. Discharge Summary sent to facility. RN given number for report [phone] Room 146). DC packet on chart. Ambulance transport requested for patient.   CSW signing off.  Cedric Fishman, LCSW Clinical Social Worker [phone]

## 2018-01-21 NOTE — Progress Notes (Signed)
Physical Therapy Treatment Patient Details Name: Timothy [name] MRN: [id number] DOB: 1940-11-06 Today's Date: 01/21/2018    History of Present Illness 77 y.o. male with medical history significant of alcohol abuse, hypertension, diabetes, previous poor vision who is a resident of local assisted living facility that was found to be having significant altered mental status. MRI + acute left cerebral hemispheric CVA.    PT Comments    Patient is progressing very well towards their physical therapy goals, as evidenced by increased ambulation distance to 100 feet using walker and min-moderate assistance for balance. Improved balance noted with use of walker for bilateral hand support, however, patient needs manual assistance of walker for negotiating obstacles and turns (likely impacted by visual deficits as well). Rest of session focused on functional strength training with sit to stands for lower extremity strength/power. D/c plan remains appropriate.    Follow Up Recommendations  SNF     Equipment Recommendations  Rolling walker with 5" wheels;3in1 (PT)    Recommendations for Other Services       Precautions / Restrictions Precautions Precautions: Fall Precaution Comments: significant visual impairment Restrictions Weight Bearing Restrictions: No    Mobility  Bed Mobility               General bed mobility comments: OOB in chair  Transfers Overall transfer level: Needs assistance Equipment used: Rolling walker (2 wheeled) Transfers: Sit to/from Stand Sit to Stand: Min guard         General transfer comment: cueing for turning fully prior to sitting down  Ambulation/Gait Ambulation/Gait assistance: Mod assist;Min assist Gait Distance (Feet): 100 Feet Assistive device: Rolling walker (2 wheeled) Gait Pattern/deviations: Step-through pattern;Decreased stride length Gait velocity: decr   General Gait Details: patient with improved balance noted with walker however  tending to bump into objects and needs manual assistance for turns and negotiating obstacles. cues for proximity to walker    Stairs             Wheelchair Mobility    Modified Rankin (Stroke Patients Only) Modified Rankin (Stroke Patients Only) Pre-Morbid Rankin Score: Slight disability Modified Rankin: Moderately severe disability     Balance Overall balance assessment: Needs assistance   Sitting balance-Leahy Scale: Good       Standing balance-Leahy Scale: Fair Standing balance comment: able to statically stand with supervision at sink and toilet but needs additional support for dynamic balance                            Cognition Arousal/Alertness: Awake/alert Behavior During Therapy: WFL for tasks assessed/performed Overall Cognitive Status: Impaired/Different from baseline Area of Impairment: Problem solving                             Problem Solving: Difficulty sequencing;Requires verbal cues;Requires tactile cues        Exercises Other Exercises Other Exercises: Serial sit to stands x 5 for functional strengthening    General Comments General comments (skin integrity, edema, etc.): [name], interpreter was present and facilitated communication during session      Pertinent Vitals/Pain Pain Assessment: Faces Faces Pain Scale: No hurt Pain Location: States he "feels better today."    Home Living                      Prior Function            PT  Goals (current goals can now be found in the care plan section) Acute Rehab PT Goals Patient Stated Goal: none stated, but agreeable for mobility Progress towards PT goals: Progressing toward goals    Frequency    Min 4X/week      PT Plan Current plan remains appropriate    Co-evaluation              AM-PAC PT "6 Clicks" Daily Activity  Outcome Measure  Difficulty turning over in bed (including adjusting bedclothes, sheets and blankets)?: None Difficulty  moving from lying on back to sitting on the side of the bed? : None Difficulty sitting down on and standing up from a chair with arms (e.g., wheelchair, bedside commode, etc,.)?: A Little Help needed moving to and from a bed to chair (including a wheelchair)?: A Little Help needed walking in hospital room?: A Little Help needed climbing 3-5 steps with a railing? : A Lot 6 Click Score: 19    End of Session Equipment Utilized During Treatment: Gait belt Activity Tolerance: Patient tolerated treatment well Patient left: in chair;with call bell/phone within reach;with chair alarm set Nurse Communication: Mobility status PT Visit Diagnosis: Unsteadiness on feet (R26.81);Other abnormalities of gait and mobility (R26.89);Difficulty in walking, not elsewhere classified (R26.2)     Time: 8887-5797 PT Time Calculation (min) (ACUTE ONLY): 15 min  Charges:  $Gait Training: 8-22 mins                    Ellamae Sia, [place], DPT Acute Rehabilitation Services Pager [phone] Office [phone]    Willy Eddy 01/21/2018, 12:17 PM

## 2018-01-22 MED ORDER — CARVEDILOL 6.25 MG PO TABS
6.2500 mg | ORAL_TABLET | Freq: Two times a day (BID) | ORAL | Status: DC
  Administered 2018-01-22: 6.25 mg via ORAL
  Filled 2018-01-22: qty 1

## 2018-01-22 MED ORDER — AMLODIPINE BESYLATE 10 MG PO TABS
10.0000 mg | ORAL_TABLET | Freq: Every day | ORAL | Status: DC
  Administered 2018-01-22: 10 mg via ORAL
  Filled 2018-01-22: qty 1

## 2018-01-22 MED ORDER — ISOSORBIDE MONONITRATE ER 30 MG PO TB24
30.0000 mg | ORAL_TABLET | Freq: Every day | ORAL | Status: DC
  Administered 2018-01-22: 30 mg via ORAL
  Filled 2018-01-22: qty 1

## 2018-01-22 MED ORDER — HYDRALAZINE HCL 10 MG PO TABS
10.0000 mg | ORAL_TABLET | Freq: Three times a day (TID) | ORAL | Status: DC
  Administered 2018-01-22: 10 mg via ORAL
  Filled 2018-01-22: qty 1

## 2018-01-22 NOTE — Progress Notes (Signed)
Patient is set to discharge to [place] at [facility] today. Patient & friend, [name], aware. Discharge packet given to RN, RN.  PTAR scheduled for transport.    Kingsley Spittle, [place]  [phone]

## 2018-01-22 NOTE — Progress Notes (Signed)
Report called to [facility]. Spoke to [place] LPN. Questions answered. Nurse confirms she received transfer packet. Awaiting PTAR

## 2018-01-22 NOTE — Progress Notes (Signed)
Discharge delayed secondary to hypertension.  S: no events overnight O: Afebrile, 98.3, 16, 66, 182/59, Appears calm, comfortable.  Alert.  Cardiovascular regular rate and rhythm.  No murmur, rub or gallop.  Respiratory clear to auscultation bilaterally.  No wheezes, rales or rhonchi.  Normal respiratory effort. Moves all extremities well with excellent tone.  No new neurologic changes noted.  Assessment/plan Resume antihypertensives. Transfer to skilled nursing facility today.  Murray Hodgkins, MD [facility] [phone]

## 2018-01-22 NOTE — Progress Notes (Signed)
Due to the patient's limited understanding of English, impaired vision and hearing, and the lack of availability of an interpreter, I am unable to complete an NIH assessment.  I have completed a neuro assessment instead and will do so every 4 hours throughout the shift.

## 2018-01-27 ENCOUNTER — Telehealth (INDEPENDENT_AMBULATORY_CARE_PROVIDER_SITE_OTHER): Payer: Self-pay

## 2018-01-27 NOTE — Telephone Encounter (Signed)
Patients social worker called stating that patient recently had a stroke and is now at [facility] for rehab. She is wanting to know if PCP feels as though patient needs continued skilled nursing or if he is ok to return back to [facility] assisted living facility. Social worker did mention that she believes patients health has declined more since being at [facility]. Please advise. [name], CMA   Janie Morning [phone]

## 2018-01-27 NOTE — Telephone Encounter (Signed)
I believe this patient should go to a SNF other than [facility]. Please contact Eden Lathe and see if she can help with this. Thank you.

## 2018-01-29 ENCOUNTER — Telehealth: Payer: Self-pay

## 2018-01-29 NOTE — Telephone Encounter (Signed)
Call placed to [facility] # [phone] with [facility].  She explained that she is going to a team meeting on 02/01/18 @ 1000 @ [place] to discuss the patient's status and discharge plan. She will be accompanied by Bishop Dublin, SW/Center for [facility].  She is concerned that the team at the facility will want to discharge the patient back to [facility] but she feels that he needs SNF/long term placement. She said that she will explain her concerns to the team. Instructed her to ask if the patient can just be transitioned to LTC at their facility. He already has medicaid.  She said that she will suggest this and will call this CM with an update after her meeting.

## 2018-01-29 NOTE — Telephone Encounter (Signed)
Thank you :)

## 2018-01-29 NOTE — Telephone Encounter (Signed)
[name] is aware that PCP states patient needs to go to a SNF other than [facility]. [name], CMA

## 2018-02-01 NOTE — Progress Notes (Deleted)
@Patient  ID: [name], male    DOB: 08-28-1940, 77 y.o.   MRN: [id number]  No chief complaint on file.   Referring provider: Tawny Asal  HPI:  77 year old male former smoker referred to our office on 01/05/2018 for evaluation of cough  PMH: Alcohol abuse, type 2 diabetes, hypertension, poor vision, CKD Smoker/ Smoking History: Former smoker Maintenance:   Pt of: Icard  [employer] Pulmonary Encounters:   01/05/2018-initial office visit-Icard Patient was initially seen in the emergency room in 12/18/2017 for leg pain and weakness.  Patient was referred to our office for evaluation of cough.  Patient is from [place] and only speaks [place].  Patient came here after the war.  All information obtained today is via interpreter.  Patient currently has no other respiratory complaints except for chronic cough which is usually dry, cough has recently started to get worse.  Patient denies fevers, chills, night sweats.  No family with lung disease. Plan: AFB sputum for Mycobacterium culture, noncontrast CT of chest, consider outpatient bronchoscopy  02/01/2018  - Visit   HPI  Tests:   PPD - 2018 negative  01/20/2018-CBC hemoglobin 9.6, MCV 78.7, WBC 12.7 11/22/26- basic metabolic panel creatinine 1.86, BUN 28, GFR 33  February 2019 >>> CT chest reviewed with evidence of groundglass associated nodule of the right upper lobe greater than 1 cm.  Also has significant dilatation of the airways with the right middle lobe and left lower lobe consistent with bronchiectasis and scattered areas of tree-in-bud opacities. 01/18/2018-chest x-ray-patchy consolidation left lower hemothorax 01/18/2018-CT head without contrast- large area of low attenuation within left cerebral hemisphere most compatible with subacute far 01/18/2018- MRI brain without contrast-patchy acute infarct along cerebral convexities and left corona radiata remote left BKA infarct 01/18/2018-MRI head without contrast- patchy  acute infarct along cerebral convexities and left corona radiata 01/20/18-echocardiogram-LV ejection fraction 55 to 31%, grade 1 diastolic dysfunction, pulmonary artery normal-sized  Pt never produced sputum sample results   Chart Review:  01/18/2018-hospitalization CVA >>>Discharge date 01/22/2018 >>>Patient transferred to SNF>>> 01/22/18    Specialty Problems      Pulmonary Problems   Flu syndrome      No Known Allergies  Immunization History  Administered Date(s) Administered  . Influenza,inj,Quad PF,6+ Mos 02/13/2017  . PPD Test 02/11/2017  . Pneumococcal Polysaccharide-23 04/08/2017  . Tdap 11/15/2013    Past Medical History:  Diagnosis Date  . Alcohol abuse    Patient denies, but was shared with people from [facility] by his male roommate.  . Bilateral cataracts 04/08/2017  . DM (diabetes mellitus), type 2 with renal complications ([place])    uncertain when diagnosed  . HOH (hard of hearing)   . Hypertension   . Legally blind    B/L  . Poor vision     Tobacco History: Social History   Tobacco Use  Smoking Status Former Smoker  . Types: Cigarettes  . Last attempt to quit: 05/19/1990  . Years since quitting: 27.7  Smokeless Tobacco Never Used   Counseling given: Not Answered   Outpatient Encounter Medications as of 02/02/2018  Medication Sig  . albuterol (PROVENTIL HFA;VENTOLIN HFA) 108 (90 Base) MCG/ACT inhaler Inhale 2 puffs into the lungs every 6 (six) hours as needed for wheezing or shortness of breath.  Marland Kitchen amLODipine (NORVASC) 10 MG tablet Take 1 tablet (10 mg total) by mouth daily.  Marland Kitchen aspirin 81 MG chewable tablet Chew 1 tablet (81 mg total) by mouth daily.  Marland Kitchen atorvastatin (LIPITOR) 20  MG tablet Take 1 tablet (20 mg total) by mouth daily.  . carvedilol (COREG) 6.25 MG tablet Take 1 tablet (6.25 mg total) by mouth 2 (two) times daily with a meal.  . clopidogrel (PLAVIX) 75 MG tablet Take 1 tablet (75 mg total) by mouth daily. Take through 9/23, then take aspirin  alone.  . Difluprednate (DUREZOL) 0.05 % EMUL Place 1 drop into the right eye 4 (four) times daily.  . ferrous gluconate (FERGON) 324 MG tablet Take 1 tablet (324 mg total) by mouth daily with breakfast.  . folic acid (FOLVITE) 1 MG tablet Take 1 tablet (1 mg total) by mouth daily.  . hydrALAZINE (APRESOLINE) 10 MG tablet Take 1 tablet (10 mg total) by mouth 3 (three) times daily.  . isosorbide mononitrate (IMDUR) 30 MG 24 hr tablet Take 1 tablet (30 mg total) by mouth daily.  Marland Kitchen ketorolac (ACULAR) 0.4 % SOLN Place 1 drop into the right eye 4 (four) times daily.  Marland Kitchen ofloxacin (OCUFLOX) 0.3 % ophthalmic solution Place 1 drop into the right eye 4 (four) times daily.  Marland Kitchen omega-3 acid ethyl esters (LOVAZA) 1 g capsule Take 2 capsules (2 g total) by mouth 2 (two) times daily.  . prednisoLONE acetate (PRED FORTE) 1 % ophthalmic suspension Place 1 drop into the right eye 4 (four) times daily.  Marland Kitchen Respiratory Therapy Supplies (FLUTTER) DEVI 1 Device by Does not apply route as directed.  . sodium bicarbonate 650 MG tablet Take 1 tablet (650 mg total) by mouth 2 (two) times daily.  . tamsulosin (FLOMAX) 0.4 MG CAPS capsule Take 1 capsule (0.4 mg total) by mouth daily.  Marland Kitchen thiamine (VITAMIN B-1) 100 MG tablet Take 100 mg by mouth daily.   No facility-administered encounter medications on file as of 02/02/2018.      Review of Systems  Review of Systems   Physical Exam  There were no vitals taken for this visit.  Wt Readings from Last 5 Encounters:  01/18/18 103 lb 2.8 oz (46.8 kg)  01/05/18 103 lb 6.4 oz (46.9 kg)  12/17/17 105 lb (47.6 kg)  12/06/17 105 lb 9.6 oz (47.9 kg)  11/30/17 108 lb 3.2 oz (49.1 kg)     Physical Exam    Lab Results:  CBC    Component Value Date/Time   WBC 12.7 (H) 01/20/2018 0405   RBC 3.95 (L) 01/20/2018 0405   HGB 9.6 (L) 01/20/2018 0405   HGB 9.8 (L) 11/30/2017 1110   HCT 31.1 (L) 01/20/2018 0405   HCT 30.7 (L) 11/30/2017 1110   PLT 228 01/20/2018 0405    PLT 325 11/30/2017 1110   MCV 78.7 01/20/2018 0405   MCV 77 (L) 11/30/2017 1110   MCH 24.3 (L) 01/20/2018 0405   MCHC 30.9 01/20/2018 0405   RDW 12.5 01/20/2018 0405   RDW 16.1 (H) 11/30/2017 1110   LYMPHSABS 2.6 01/18/2018 1526   LYMPHSABS 2.0 11/30/2017 1110   MONOABS 0.8 01/18/2018 1526   EOSABS 0.6 01/18/2018 1526   EOSABS 0.6 (H) 11/30/2017 1110   BASOSABS 0.1 01/18/2018 1526   BASOSABS 0.0 11/30/2017 1110    BMET    Component Value Date/Time   NA 143 01/20/2018 0405   NA 141 11/30/2017 1110   K 4.3 01/20/2018 0405   CL 116 (H) 01/20/2018 0405   CO2 21 (L) 01/20/2018 0405   GLUCOSE 133 (H) 01/20/2018 0405   BUN 28 (H) 01/20/2018 0405   BUN 38 (H) 11/30/2017 1110   CREATININE 1.86 (H)  01/20/2018 0405   CALCIUM 8.7 (L) 01/20/2018 0405   GFRNONAA 33 (L) 01/20/2018 0405   GFRAA 38 (L) 01/20/2018 0405    BNP No results found for: BNP  ProBNP No results found for: PROBNP  Imaging: Ct Head Wo Contrast  Result Date: 01/18/2018 CLINICAL DATA:  Patient with altered mental status. EXAM: CT HEAD WITHOUT CONTRAST TECHNIQUE: Contiguous axial images were obtained from the base of the skull through the vertex without intravenous contrast. COMPARISON:  Brain CT 11/15/2013. FINDINGS: Brain: Ventricles and sulci are prominent compatible with atrophy. Chronic bilateral thalamic lacunar infarcts. Patchy low attenuation within the left frontal lobe (image 22; series 3). Large area of low attenuation within the left cerebellar hemisphere (image 8; series 3). No mass lesion or mass-effect. Vascular: Unremarkable Skull: Intact. Sinuses/Orbits: Left maxillary sinus mucosal thickening. Mastoid air cells are unremarkable. Postprocedural changes within the orbits. Other: None. IMPRESSION: Large area of low attenuation within the left cerebellar hemisphere most compatible with subacute infarct. Small area of low attenuation within the left frontal lobe most compatible with age-indeterminate infarct.  These results were called by telephone at the time of interpretation on 01/18/2018 at 4:24 pm to Dr. Dorie Rank , who verbally acknowledged these results. Electronically Signed   By: Lovey Newcomer M.D.   On: 01/18/2018 16:26   Mr [name] Head Wo Contrast  Result Date: 01/18/2018 CLINICAL DATA:  Altered mental status. EXAM: MRI HEAD WITHOUT CONTRAST MRA HEAD WITHOUT CONTRAST TECHNIQUE: Multiplanar, multiecho pulse sequences of the brain and surrounding structures were obtained without intravenous contrast. Angiographic images of the head were obtained using MRA technique without contrast. COMPARISON:  Head CT earlier today FINDINGS: MRI HEAD FINDINGS Brain: Left inferior cerebellar infarct is remote. There are patchy acute infarcts that are overall small in size, seen along the bilateral cerebral convexities and at the left corona radiata. The most confluent cortically based infarcts measured 2.4 cm in the right occipital lobe and 2.8 cm in the anterior left frontal lobe. Remote lacunar infarcts in the bilateral thalamus, right caudate head, and pons. Remote hemorrhage in the external capsule on the right with hemosiderin staining. There is moderate ischemic gliosis in the periventricular white matter. No acute hemorrhage, hydrocephalus, or masslike finding. Vascular: Arterial findings below. Preserved dural venous sinus flow voids. Skull and upper cervical spine: No evidence of marrow lesion Sinuses/Orbits: Postoperative globes. Mild mucosal thickening in the left maxillary sinus. MRA HEAD FINDINGS Motion degraded. The carotid, vertebral, and basilar arteries are patent. Probable generalized atheromatous undulation of vessels. No branch occlusion or flow limiting stenosis suspected. No suspected aneurysm. IMPRESSION: 1. Patchy acute infarct along the cerebral convexities and left corona radiata. These are seen in the anterior and posterior circulation and suggest central embolic disease. 2. Remote left PICA infarct.  Multiple remote lacunar infarcts. 3. Remote external capsule hemorrhage on the right. 4. Motion degraded intracranial MRA without acute finding. Electronically Signed   By: Monte Fantasia M.D.   On: 01/18/2018 20:05   Mr Brain Wo Contrast  Result Date: 01/18/2018 CLINICAL DATA:  Altered mental status. EXAM: MRI HEAD WITHOUT CONTRAST MRA HEAD WITHOUT CONTRAST TECHNIQUE: Multiplanar, multiecho pulse sequences of the brain and surrounding structures were obtained without intravenous contrast. Angiographic images of the head were obtained using MRA technique without contrast. COMPARISON:  Head CT earlier today FINDINGS: MRI HEAD FINDINGS Brain: Left inferior cerebellar infarct is remote. There are patchy acute infarcts that are overall small in size, seen along the bilateral cerebral convexities and at  the left corona radiata. The most confluent cortically based infarcts measured 2.4 cm in the right occipital lobe and 2.8 cm in the anterior left frontal lobe. Remote lacunar infarcts in the bilateral thalamus, right caudate head, and pons. Remote hemorrhage in the external capsule on the right with hemosiderin staining. There is moderate ischemic gliosis in the periventricular white matter. No acute hemorrhage, hydrocephalus, or masslike finding. Vascular: Arterial findings below. Preserved dural venous sinus flow voids. Skull and upper cervical spine: No evidence of marrow lesion Sinuses/Orbits: Postoperative globes. Mild mucosal thickening in the left maxillary sinus. MRA HEAD FINDINGS Motion degraded. The carotid, vertebral, and basilar arteries are patent. Probable generalized atheromatous undulation of vessels. No branch occlusion or flow limiting stenosis suspected. No suspected aneurysm. IMPRESSION: 1. Patchy acute infarct along the cerebral convexities and left corona radiata. These are seen in the anterior and posterior circulation and suggest central embolic disease. 2. Remote left PICA infarct. Multiple  remote lacunar infarcts. 3. Remote external capsule hemorrhage on the right. 4. Motion degraded intracranial MRA without acute finding. Electronically Signed   By: Monte Fantasia M.D.   On: 01/18/2018 20:05   Dg Chest Portable 1 View  Result Date: 01/18/2018 CLINICAL DATA:  Altered mental status EXAM: PORTABLE CHEST 1 VIEW COMPARISON:  Chest radiograph 12/04/2017 FINDINGS: Monitoring leads overlie the patient. Stable cardiac and mediastinal contours. Patchy consolidation left lower hemithorax. Small left pleural effusion. No pneumothorax. IMPRESSION: Patchy consolidation left lower hemithorax may be secondary to known bronchiectasis, superimposed infection not excluded in the appropriate clinical setting. Small left effusion. Electronically Signed   By: Lovey Newcomer M.D.   On: 01/18/2018 15:52      Assessment & Plan:     No problem-specific Assessment & Plan notes found for this encounter.     Lauraine Rinne, NP 02/01/2018

## 2018-02-02 ENCOUNTER — Inpatient Hospital Stay: Payer: Medicare Other | Admitting: Pulmonary Disease

## 2018-02-03 ENCOUNTER — Telehealth: Payer: Self-pay

## 2018-02-03 NOTE — Telephone Encounter (Signed)
Call placed to [facility] # [phone] with [facility] [employer] to inquire about the outcome of the meeting with [facility] on 02/01/18. Message left requesting a call back to # [phone].

## 2018-02-04 ENCOUNTER — Ambulatory Visit (INDEPENDENT_AMBULATORY_CARE_PROVIDER_SITE_OTHER): Payer: Medicare Other | Admitting: Physician Assistant

## 2018-02-05 ENCOUNTER — Ambulatory Visit: Payer: Medicare Other | Admitting: Internal Medicine

## 2018-02-08 ENCOUNTER — Telehealth: Payer: Self-pay

## 2018-02-08 NOTE — Telephone Encounter (Signed)
Call received from  [facility] with [facility].  She said that [facility] does not feel that he qualifies for SNF placement. The plan is to discharge him back to [facility] at the end of this week. They will order a home health nurse to work with him 1-2 times/week as well as home PT  Roxanne, SW at [facility] can be contacted if there is any additional information needed.  Veronda Prude stated that she believes this is an appropriate discharge plan at this time.

## 2018-02-18 ENCOUNTER — Telehealth (INDEPENDENT_AMBULATORY_CARE_PROVIDER_SITE_OTHER): Payer: Self-pay | Admitting: Physician Assistant

## 2018-02-18 NOTE — Telephone Encounter (Signed)
[place] therapists from [place] at [facility] requesting verbal orders for  1 week 1   2 week 2 to address swallowing.  Front consoluted with CMA and authorized the verbal order. Junie Panning also stated that Mr. Niranjan BP was 155-77 and just want to inform PCP about it.  Junie Panning [phone]  Thank you Emmit Pomfret

## 2018-02-18 NOTE — Telephone Encounter (Signed)
Noted. Blood pressure is better than previous readings. I will see him on 02/26/18 for his appointment here.

## 2018-02-20 ENCOUNTER — Emergency Department (HOSPITAL_COMMUNITY): Payer: Medicare Other

## 2018-02-20 ENCOUNTER — Observation Stay (HOSPITAL_COMMUNITY)
Admission: EM | Admit: 2018-02-20 | Discharge: 2018-02-23 | Disposition: A | Discharge status: Home / Self Care | Payer: Medicare Other | Attending: Internal Medicine | Admitting: Internal Medicine

## 2018-02-20 ENCOUNTER — Encounter (HOSPITAL_COMMUNITY): Payer: Self-pay

## 2018-02-20 DIAGNOSIS — E1121 Type 2 diabetes mellitus with diabetic nephropathy: Secondary | ICD-10-CM | POA: Diagnosis not present

## 2018-02-20 DIAGNOSIS — G459 Transient cerebral ischemic attack, unspecified: Principal | ICD-10-CM | POA: Insufficient documentation

## 2018-02-20 DIAGNOSIS — I129 Hypertensive chronic kidney disease with stage 1 through stage 4 chronic kidney disease, or unspecified chronic kidney disease: Secondary | ICD-10-CM | POA: Diagnosis not present

## 2018-02-20 DIAGNOSIS — Z87891 Personal history of nicotine dependence: Secondary | ICD-10-CM | POA: Insufficient documentation

## 2018-02-20 DIAGNOSIS — E1129 Type 2 diabetes mellitus with other diabetic kidney complication: Secondary | ICD-10-CM | POA: Diagnosis present

## 2018-02-20 DIAGNOSIS — E1122 Type 2 diabetes mellitus with diabetic chronic kidney disease: Secondary | ICD-10-CM | POA: Insufficient documentation

## 2018-02-20 DIAGNOSIS — I10 Essential (primary) hypertension: Secondary | ICD-10-CM | POA: Diagnosis not present

## 2018-02-20 DIAGNOSIS — Z79899 Other long term (current) drug therapy: Secondary | ICD-10-CM | POA: Insufficient documentation

## 2018-02-20 DIAGNOSIS — E11319 Type 2 diabetes mellitus with unspecified diabetic retinopathy without macular edema: Secondary | ICD-10-CM | POA: Diagnosis not present

## 2018-02-20 DIAGNOSIS — R4182 Altered mental status, unspecified: Secondary | ICD-10-CM

## 2018-02-20 DIAGNOSIS — Z7982 Long term (current) use of aspirin: Secondary | ICD-10-CM | POA: Diagnosis not present

## 2018-02-20 DIAGNOSIS — R29810 Facial weakness: Secondary | ICD-10-CM | POA: Diagnosis present

## 2018-02-20 DIAGNOSIS — N183 Chronic kidney disease, stage 3 unspecified: Secondary | ICD-10-CM | POA: Diagnosis present

## 2018-02-20 DIAGNOSIS — H548 Legal blindness, as defined in USA: Secondary | ICD-10-CM

## 2018-02-20 DIAGNOSIS — R2990 Unspecified symptoms and signs involving the nervous system: Secondary | ICD-10-CM

## 2018-02-20 LAB — CBC WITH DIFFERENTIAL/PLATELET
Abs Immature Granulocytes: 0 10*3/uL (ref 0.0–0.1)
Basophils Absolute: 0.1 10*3/uL (ref 0.0–0.1)
Basophils Relative: 1 %
Eosinophils Absolute: 0.8 10*3/uL — ABNORMAL HIGH (ref 0.0–0.7)
Eosinophils Relative: 8 %
HCT: 29.8 % — ABNORMAL LOW (ref 39.0–52.0)
HEMOGLOBIN: 9 g/dL — AB (ref 13.0–17.0)
IMMATURE GRANULOCYTES: 0 %
LYMPHS ABS: 2.3 10*3/uL (ref 0.7–4.0)
Lymphocytes Relative: 22 %
MCH: 24.1 pg — ABNORMAL LOW (ref 26.0–34.0)
MCHC: 30.2 g/dL (ref 30.0–36.0)
MCV: 79.9 fL (ref 78.0–100.0)
MONOS PCT: 7 %
Monocytes Absolute: 0.7 10*3/uL (ref 0.1–1.0)
NEUTROS ABS: 6.7 10*3/uL (ref 1.7–7.7)
NEUTROS PCT: 62 %
Platelets: 279 10*3/uL (ref 150–400)
RBC: 3.73 MIL/uL — ABNORMAL LOW (ref 4.22–5.81)
RDW: 13 % (ref 11.5–15.5)
WBC: 10.6 10*3/uL — ABNORMAL HIGH (ref 4.0–10.5)

## 2018-02-20 LAB — COMPREHENSIVE METABOLIC PANEL
ALT: 24 U/L (ref 0–44)
AST: 32 U/L (ref 15–41)
Albumin: 3.3 g/dL — ABNORMAL LOW (ref 3.5–5.0)
Alkaline Phosphatase: 128 U/L — ABNORMAL HIGH (ref 38–126)
Anion gap: 6 (ref 5–15)
BUN: 43 mg/dL — AB (ref 8–23)
CO2: 24 mmol/L (ref 22–32)
CREATININE: 2.71 mg/dL — AB (ref 0.61–1.24)
Calcium: 8.4 mg/dL — ABNORMAL LOW (ref 8.9–10.3)
Chloride: 106 mmol/L (ref 98–111)
GFR calc Af Amer: 24 mL/min — ABNORMAL LOW (ref >60)
GFR, EST NON AFRICAN AMERICAN: 21 mL/min — AB (ref >60)
Glucose, Bld: 376 mg/dL — ABNORMAL HIGH (ref 70–99)
Potassium: 5.4 mmol/L — ABNORMAL HIGH (ref 3.5–5.1)
Sodium: 136 mmol/L (ref 135–145)
TOTAL PROTEIN: 6.5 g/dL (ref 6.5–8.1)
Total Bilirubin: 0.3 mg/dL (ref 0.3–1.2)

## 2018-02-20 LAB — URINALYSIS, ROUTINE W REFLEX MICROSCOPIC
BILIRUBIN URINE: NEGATIVE
Bacteria, UA: NONE SEEN
Glucose, UA: >=500 mg/dL — AB
HGB URINE DIPSTICK: NEGATIVE
Ketones, ur: NEGATIVE mg/dL
Leukocytes, UA: NEGATIVE
Nitrite: NEGATIVE
PH: 7 (ref 5.0–8.0)
Protein, ur: 100 mg/dL — AB
SPECIFIC GRAVITY, URINE: 1.011 (ref 1.005–1.030)

## 2018-02-20 LAB — CBG MONITORING, ED: Glucose-Capillary: 312 mg/dL — ABNORMAL HIGH (ref 70–99)

## 2018-02-20 LAB — RAPID URINE DRUG SCREEN, HOSP PERFORMED
Amphetamines: NOT DETECTED
Barbiturates: NOT DETECTED
Benzodiazepines: NOT DETECTED
COCAINE: NOT DETECTED
OPIATES: NOT DETECTED
TETRAHYDROCANNABINOL: NOT DETECTED

## 2018-02-20 LAB — SALICYLATE LEVEL: Salicylate Lvl: <7 mg/dL (ref 2.8–30.0)

## 2018-02-20 LAB — ACETAMINOPHEN LEVEL: Acetaminophen (Tylenol), Serum: <10 ug/mL — ABNORMAL LOW (ref 10–30)

## 2018-02-20 LAB — AMMONIA: Ammonia: 19 umol/L (ref 9–35)

## 2018-02-20 LAB — ETHANOL

## 2018-02-20 MED ORDER — HEPARIN SODIUM (PORCINE) 5000 UNIT/ML IJ SOLN
5000.0000 [IU] | Freq: Three times a day (TID) | INTRAMUSCULAR | Status: DC
  Administered 2018-02-21 – 2018-02-23 (×7): 5000 [IU] via SUBCUTANEOUS
  Filled 2018-02-20 (×8): qty 1

## 2018-02-20 MED ORDER — SODIUM BICARBONATE 650 MG PO TABS
650.0000 mg | ORAL_TABLET | Freq: Two times a day (BID) | ORAL | Status: DC
  Administered 2018-02-21 – 2018-02-23 (×5): 650 mg via ORAL
  Filled 2018-02-20 (×5): qty 1

## 2018-02-20 MED ORDER — ASPIRIN 81 MG PO CHEW
81.0000 mg | CHEWABLE_TABLET | Freq: Every day | ORAL | Status: DC
  Administered 2018-02-21 – 2018-02-23 (×3): 81 mg via ORAL
  Filled 2018-02-20 (×3): qty 1

## 2018-02-20 MED ORDER — VITAMIN B-1 100 MG PO TABS
100.0000 mg | ORAL_TABLET | Freq: Every day | ORAL | Status: DC
  Administered 2018-02-21 – 2018-02-23 (×3): 100 mg via ORAL
  Filled 2018-02-20 (×3): qty 1

## 2018-02-20 MED ORDER — ROSUVASTATIN CALCIUM 5 MG PO TABS
5.0000 mg | ORAL_TABLET | Freq: Every evening | ORAL | Status: DC
  Administered 2018-02-21 – 2018-02-22 (×2): 5 mg via ORAL
  Filled 2018-02-20 (×2): qty 1

## 2018-02-20 MED ORDER — PREDNISOLONE ACETATE 1 % OP SUSP
1.0000 [drp] | Freq: Four times a day (QID) | OPHTHALMIC | Status: DC
  Administered 2018-02-21 – 2018-02-23 (×10): 1 [drp] via OPHTHALMIC
  Filled 2018-02-20: qty 5

## 2018-02-20 MED ORDER — DIFLUPREDNATE 0.05 % OP EMUL: 1.0000 [drp] | Freq: Four times a day (QID) | OPHTHALMIC | Status: DC

## 2018-02-20 MED ORDER — OMEGA-3-ACID ETHYL ESTERS 1 G PO CAPS
2.0000 g | ORAL_CAPSULE | Freq: Two times a day (BID) | ORAL | Status: DC
  Administered 2018-02-21 – 2018-02-23 (×4): 2 g via ORAL
  Filled 2018-02-20 (×5): qty 2

## 2018-02-20 MED ORDER — ALBUTEROL SULFATE (2.5 MG/3ML) 0.083% IN NEBU: 3.0000 mL | INHALATION_SOLUTION | Freq: Four times a day (QID) | RESPIRATORY_TRACT | Status: DC | PRN

## 2018-02-20 MED ORDER — STROKE: EARLY STAGES OF RECOVERY BOOK
Freq: Once | Status: AC
  Administered 2018-02-21
  Filled 2018-02-20: qty 1

## 2018-02-20 MED ORDER — FOLIC ACID 1 MG PO TABS
1.0000 mg | ORAL_TABLET | Freq: Every day | ORAL | Status: DC
  Administered 2018-02-21 – 2018-02-23 (×3): 1 mg via ORAL
  Filled 2018-02-20 (×3): qty 1

## 2018-02-20 MED ORDER — DORZOLAMIDE HCL-TIMOLOL MAL 2-0.5 % OP SOLN
1.0000 [drp] | Freq: Two times a day (BID) | OPHTHALMIC | Status: DC
  Administered 2018-02-21 – 2018-02-23 (×6): 1 [drp] via OPHTHALMIC
  Filled 2018-02-20: qty 10

## 2018-02-20 MED ORDER — SODIUM CHLORIDE 0.9 % IV SOLN
INTRAVENOUS | Status: DC
  Administered 2018-02-21 – 2018-02-22 (×2): 1000 mL via INTRAVENOUS

## 2018-02-20 MED ORDER — ACETAMINOPHEN 325 MG PO TABS
650.0000 mg | ORAL_TABLET | Freq: Four times a day (QID) | ORAL | Status: DC | PRN
  Administered 2018-02-22: 650 mg via ORAL
  Filled 2018-02-20: qty 2

## 2018-02-20 MED ORDER — KETOROLAC TROMETHAMINE 0.5 % OP SOLN
1.0000 [drp] | Freq: Four times a day (QID) | OPHTHALMIC | Status: DC
  Administered 2018-02-21 – 2018-02-23 (×10): 1 [drp] via OPHTHALMIC
  Filled 2018-02-20: qty 5

## 2018-02-20 MED ORDER — INSULIN ASPART 100 UNIT/ML ~~LOC~~ SOLN
0.0000 [IU] | SUBCUTANEOUS | Status: DC
  Administered 2018-02-21: 2 [IU] via SUBCUTANEOUS
  Administered 2018-02-21: 1 [IU] via SUBCUTANEOUS

## 2018-02-20 MED ORDER — LACTATED RINGERS IV BOLUS
1000.0000 mL | Freq: Once | INTRAVENOUS | Status: AC
  Administered 2018-02-20: 1000 mL via INTRAVENOUS

## 2018-02-20 NOTE — ED Triage Notes (Signed)
Pt arrived via [facility] from [facility] in [place] and left side facial droop.  LKN "sometime yesterday" according to facility.  Pt just c/o being cold and is actively shaking.  Pt speaks [place]namese. CBG 484

## 2018-02-20 NOTE — ED Notes (Addendum)
Checked pts brief to make sure he was dry. Brief not wet. Did not put condom cath on due to when I had to remove the one earlier so we could I&O he screamed in pain as I pulled it off, so I regularly checked his brief.

## 2018-02-20 NOTE — ED Notes (Signed)
Patient transported to CT 

## 2018-02-20 NOTE — H&P (Signed)
History and Physical    Timothy [name] DOB: 1941/04/23 DOA: 02/20/2018  PCP: [name], PA-C  Patient coming from: ALF  I have personally briefly reviewed patient's old medical records in [place]  Chief Complaint: Facial droop, L arm weakness  HPI: Timothy [name] is a 77 y.o. male with medical history significant of DM2, HTN, legally blind.  Patient was admitted to hospital last month for multiple embolic strokes with R hemianopsia.  Patient was sent in from ALF today after staff noticed that he had facial droop (not quite clear which side) and LEFT arm weakness.  At ALF there is one resident that speaks his language: [name].   ED Course: In the ED, patient in no distress, but further history taking from patient not possible due to language barrier, he speaks [name], and doesn't understand [place].  BGL 312.  Creat 2.7 (1.8 at discharge last month).  CT head neg.   Review of Systems: Unable to perform due to language barrier.  Past Medical History:  Diagnosis Date  . Alcohol abuse    Patient denies, but was shared with people from [facility] by his male roommate.  . Bilateral cataracts 04/08/2017  . DM (diabetes mellitus), type 2 with renal complications ([place])    uncertain when diagnosed  . HOH (hard of hearing)   . Hypertension   . Legally blind    B/L  . Poor vision     Past Surgical History:  Procedure Laterality Date  . MEMBRANE PEEL Right 09/29/2017   Procedure: MEMBRANE PEEL WITH AIR GAS;  Surgeon: Jalene Mullet, MD;  Location: [place];  Service: Ophthalmology;  Laterality: Right;  . PARS PLANA VITRECTOMY Left 06/29/2017   Procedure: PARS PLANA VITRECTOMY WITH 25 GAUGE AND MEMBRANE PEEL WITH GAS EXCHANGE, AIR SILICONE OIL AND PHACO COAGULATION, LASER;  Surgeon: Jalene Mullet, MD;  Location: [name];  Service: Ophthalmology;  Laterality: Left;  . PARS PLANA VITRECTOMY Right 09/18/2017   Procedure: PARS PLANA  VITRECTOMY WITH 25 GAUGE MEMBRANE PILL WITH AIR GAS SILICONE OIL AND PHOTOCOAGULATION;  Surgeon: Jalene Mullet, MD;  Location: [place];  Service: Ophthalmology;  Laterality: Right;  . PARS PLANA VITRECTOMY Right 09/29/2017   Procedure: PARS PLANA VITRECTOMY WITH 25 GAUGE;  Surgeon: Jalene Mullet, MD;  Location: [place];  Service: Ophthalmology;  Laterality: Right;  . PHOTOCOAGULATION Right 09/29/2017   Procedure: PHOTOCOAGULATION;  Surgeon: Jalene Mullet, MD;  Location: [place];  Service: Ophthalmology;  Laterality: Right;  . 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 His smoking use included cigarettes. He has never used smokeless tobacco. He reports that he drinks alcohol. He reports that he does not use drugs.  No Known Allergies  Family History  Problem Relation Age of Onset  . Lung disease Neg Hx      Prior to Admission medications   Medication Sig Start Date End Date Taking? Authorizing Provider  acetaminophen (TYLENOL) 325 MG tablet Take 650 mg by mouth every 6 (six) hours as needed (general discomfort).   Yes [provider]  albuterol (PROVENTIL HFA;VENTOLIN HFA) 108 (90 Base) MCG/ACT inhaler Inhale 2 puffs into the lungs every 6 (six) hours as needed for wheezing or shortness of breath. 08/24/17  Yes [name], PA-C  amLODipine (NORVASC) 10 MG tablet Take 1 tablet (10 mg total) by mouth daily. 08/24/17  Yes [name], PA-C  aspirin 81 MG chewable tablet Chew 1 tablet (81 mg total) by mouth daily. 08/24/17  Yes [name], PA-C  carvedilol (COREG) 6.25 MG tablet Take 1 tablet (6.25 mg total) by mouth 2 (two) times daily with a meal. 08/24/17  Yes [name], PA-C  Difluprednate (DUREZOL) 0.05 % EMUL Place 1 drop into the right  eye 4 (four) times daily.   Yes [provider]  dorzolamide-timolol (COSOPT) 22.3-6.8 MG/ML ophthalmic solution Place 1 drop into the right eye 2 (two) times daily.   Yes [provider]  ferrous gluconate (FERGON) 324 MG tablet Take 1 tablet (324 mg total) by mouth daily with breakfast. 08/24/17  Yes [name], PA-C  folic acid (FOLVITE) 1 MG tablet Take 1 tablet (1 mg total) by mouth daily. 08/24/17  Yes [name], PA-C  hydrALAZINE (APRESOLINE) 25 MG tablet Take 25 mg by mouth every 8 (eight) hours.   Yes [provider]  isosorbide mononitrate (IMDUR) 30 MG 24 hr tablet Take 1 tablet (30 mg total) by mouth daily. Patient taking differently: Take 30 mg by mouth at bedtime.  11/30/17  Yes [name], PA-C  ketorolac (ACULAR) 0.4 % SOLN Place 1 drop into the right eye 4 (four) times daily.   Yes [provider]  omega-3 acid ethyl esters (LOVAZA) 1 g capsule Take 2 capsules (2 g total) by mouth 2 (two) times daily. 11/30/17 02/28/18 Yes [name], PA-C  prednisoLONE acetate (PRED FORTE) 1 % ophthalmic suspension Place 1 drop into the right eye 4 (four) times daily.   Yes [provider]  rosuvastatin (CRESTOR) 5 MG tablet Take 5 mg by mouth every evening.   Yes [provider]  sodium bicarbonate 650 MG tablet Take 1 tablet (650 mg total) by mouth 2 (two) times daily. 12/06/17  Yes Regalado, Belkys A, MD  tamsulosin (FLOMAX) 0.4 MG CAPS capsule Take 1 capsule (0.4 mg total) by mouth daily. Patient taking differently: Take 0.4 mg by mouth every evening.  12/07/17  Yes Regalado, Belkys A, MD  thiamine (VITAMIN B-1) 100 MG tablet Take 100 mg by mouth daily.   Yes [provider]  clopidogrel (PLAVIX) 75 MG tablet Take 1 tablet (75 mg total) by mouth daily. Take through 9/23, then take aspirin alone. Patient not taking: Reported on 02/20/2018 01/22/18   Samuella Cota, MD  Respiratory Therapy Supplies (FLUTTER)  DEVI 1 Device by Does not apply route as directed. 01/05/18   Garner Nash, DO    Physical Exam: Vitals:   02/20/18 1900 02/20/18 1930 02/20/18 1945 02/20/18 2030  BP: (!) 150/67 (!) 153/56 (!) 154/53 (!) 159/56  Pulse: 68 71 71 79  Resp: 15 16 16 16   Temp:      TempSrc:      SpO2: 100% 100% 100% 100%    Constitutional: NAD, calm, comfortable Eyes: Anisocoria, R > L pupil ENMT: Mucous membranes are moist. Posterior pharynx clear of any exudate or lesions.Normal dentition.  Neck: normal, supple, no masses, no thyromegaly Respiratory: clear to auscultation bilaterally, no wheezing, no crackles. Normal respiratory effort. No accessory muscle use.  Cardiovascular: Regular rate and rhythm, no murmurs / rubs / gallops. No extremity edema. 2+ pedal pulses. No carotid bruits.  Abdomen: no tenderness, no masses palpated. No hepatosplenomegaly. Bowel sounds positive.  Musculoskeletal: no clubbing /  cyanosis. No joint deformity upper and lower extremities. Good ROM, no contractures. Normal muscle tone.  Skin: no rashes, lesions, ulcers. No induration Neurologic: MAE, cant really get good exam due to language barrier.  I see a suggestion of L facial droop but couldn't really get him to smile.  When I asked him several times to smile, patient said "smile" speech seemed pretty clear at least for this single word. Psychiatric: Cant really assess orientation due to language barrier.   Labs on Admission: I have personally reviewed following labs and imaging studies  CBC: Recent Labs  Lab 02/20/18 1739  WBC 10.6*  NEUTROABS 6.7  HGB 9.0*  HCT 29.8*  MCV 79.9  PLT 809   Basic Metabolic Panel: Recent Labs  Lab 02/20/18 1739  NA 136  K 5.4*  CL 106  CO2 24  GLUCOSE 376*  BUN 43*  CREATININE 2.71*  CALCIUM 8.4*   GFR: CrCl cannot be calculated (Unknown ideal weight.). Liver Function Tests: Recent Labs  Lab 02/20/18 1739  AST 32  ALT 24  ALKPHOS 128*  BILITOT 0.3  PROT 6.5    ALBUMIN 3.3*   No results for input(s): LIPASE, AMYLASE in the last 168 hours. Recent Labs  Lab 02/20/18 1740  AMMONIA 19   Coagulation Profile: No results for input(s): INR, PROTIME in the last 168 hours. Cardiac Enzymes: No results for input(s): CKTOTAL, CKMB, CKMBINDEX, TROPONINI in the last 168 hours. BNP (last 3 results) No results for input(s): PROBNP in the last 8760 hours. HbA1C: No results for input(s): HGBA1C in the last 72 hours. CBG: Recent Labs  Lab 02/20/18 1746  GLUCAP 312*   Lipid Profile: No results for input(s): CHOL, HDL, LDLCALC, TRIG, CHOLHDL, LDLDIRECT in the last 72 hours. Thyroid Function Tests: No results for input(s): TSH, T4TOTAL, FREET4, T3FREE, THYROIDAB in the last 72 hours. Anemia Panel: No results for input(s): VITAMINB12, FOLATE, FERRITIN, TIBC, IRON, RETICCTPCT in the last 72 hours. Urine analysis:    Component Value Date/Time   COLORURINE STRAW (A) 02/20/2018 1917   APPEARANCEUR CLEAR 02/20/2018 1917   LABSPEC 1.011 02/20/2018 1917   PHURINE 7.0 02/20/2018 1917   GLUCOSEU >=500 (A) 02/20/2018 [place] NEGATIVE 02/20/2018 [place] NEGATIVE 02/20/2018 [place] 02/20/2018 1917   PROTEINUR 100 (A) 02/20/2018 1917   UROBILINOGEN 0.2 11/15/2013 0231   NITRITE NEGATIVE 02/20/2018 1917   LEUKOCYTESUR NEGATIVE 02/20/2018 1917    Radiological Exams on Admission: Dg Chest 2 View  Result Date: 02/20/2018 CLINICAL DATA:  Altered mental status. EXAM: CHEST - 2 VIEW COMPARISON:  01/18/2018 and older exams. FINDINGS: Cardiac silhouette top-normal in size. No mediastinal or hilar masses. No evidence of adenopathy. Chronic scarring at the left lung base. Chronic bilateral lower lung zone bronchitic changes. Bronchovascular markings are prominent bilaterally. No evidence pneumonia or pulmonary edema. No pleural effusion or pneumothorax. Skeletal structures are demineralized but intact. IMPRESSION: No acute  cardiopulmonary disease. Electronically Signed   By: Lajean Manes M.D.   On: 02/20/2018 18:07   Ct Head Wo Contrast  Result Date: 02/20/2018 CLINICAL DATA:  Left sided facial droop with altered mental status. Last seen normal sometime yesterday. EXAM: CT HEAD WITHOUT CONTRAST TECHNIQUE: Contiguous axial images were obtained from the base of the skull through the vertex without intravenous contrast. COMPARISON:  Head CT and brain MRI, 01/18/2018. FINDINGS: Brain: No evidence of acute infarction, hemorrhage, hydrocephalus, extra-axial collection or mass lesion/mass effect. Old left PICA distribution cerebellar infarct, small  old left lateral frontal lobe infarct, small lacunar infarct along the posterior limb of the left internal capsule with additional small thalamic lacunar infarcts bilaterally. There is ventricular and sulcal enlargement reflecting mild to moderate atrophy. Other patchy areas of hypoattenuation in the white matter consistent with mild chronic microvascular ischemic change. Vascular: No hyperdense vessel or unexpected calcification. Skull: Normal. Negative for fracture or focal lesion. Sinuses/Orbits: Prosthetic left globe, stable. No acute globe or orbital abnormality. Mild left maxillary sinus mucosal thickening. Other: None. IMPRESSION: 1. No acute intracranial abnormalities. 2. Multiple old infarcts, mild to moderate atrophy and mild chronic microvascular ischemic change. Electronically Signed   By: Lajean Manes M.D.   On: 02/20/2018 18:06    EKG: Independently reviewed.  Assessment/Plan Principal Problem:   Stroke-like symptoms Active Problems:   DM (diabetes mellitus), type 2 with renal complications ([facility])   Legally blind   Hypertension   CKD (chronic kidney disease), stage III ([place])    1. Stroke-like symptoms - 1. Difficult as we cant get much of an exam or history from patient due to language barrier 2. Certainly is high risk for stroke given admission for multiple  embolic strokes of unclear origin just last month. 3. MRI brain ordered and pending 4. Neuro consult 5. Currently trying to find out if any staff member working tonight in the hospital speaks [name] to translate Manufacturing systems engineer, it seems). 6. Continue ASA 81, presumably neurology may add Plavix back 7. PT/OT/SLP 8. Will hold off on repeating 2d echo, carotid duplex 9. Tele monitor 10. Continue statin 2. DM2 - 1. Sensitive SSI Q4H for now 3. HTN - Holding BP meds pending MRI results 4. CKD stage 3 - 1. Creat slightly above baseline 2. IVF 1L in ED 3. Repeat BMP in AM  DVT prophylaxis: Heparin [place] Code Status: Full Family Communication: No family in room, not able to get a-hold of anyone by phone Disposition Plan: TBD Consults called: Neurology Admission status: Place in obs - convert to IP if MRI confirms stroke   Yuki Brunsman, [place] [facility] Pager [phone] Only works nights!  If 7AM-7PM, please contact the primary day team physician taking care of patient  [email or web address] Password [facility]  02/20/2018, 8:48 PM

## 2018-02-20 NOTE — ED Notes (Signed)
Attempted to give report; advised by nurse that this room has not been approved/reviewed by floor yet and will call me back.

## 2018-02-20 NOTE — ED Provider Notes (Signed)
[facility] EMERGENCY DEPARTMENT Provider Note   CSN: [id number] Arrival date & time: 02/20/18  1712     History   Chief Complaint Chief Complaint  Patient presents with  . Altered Mental Status  . Facial Droop    HPI Timothy [name] is a 77 y.o. male.  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  Confusion has been ongoing since yesterday.  Questionable facial droop, unknown if new or which side.  Unknown last normal.  No reported falls.  On plavix.  On arrival, patient is alert however not responsive to commands, even with the use of a [place].  He will move all 4 extremities spontaneously and withdraw from pain.  Smells of urine.  Recently admitted for a CVA one month ago.    Per chart review patient recently discharged after a 4 day hospital stay on 01/22/18 for AMS found to have an acute left cerebellar CVA.  Neurology evaluated and deemed patient no a candidate for anticoagulation secondary to baseline dementia and blindness.  He was instructed to continue Lipitor, ASA, and Plavix.  He was discharged to SNF.  Resides at [facility] [phone])  Past Medical History:  Diagnosis Date  . Alcohol abuse    Patient denies, but was shared with people from [facility] by his male roommate.  . Bilateral cataracts 04/08/2017  . DM (diabetes mellitus), type 2 with renal complications ([place])    uncertain when diagnosed  . HOH (hard of hearing)   . Hypertension   . Legally blind    B/L  . Poor vision     Patient Active Problem List   Diagnosis Date Noted  . Stroke-like symptoms 02/20/2018  . CKD (chronic kidney disease), stage III ([place]) 01/20/2018  . CVA (cerebral vascular accident) ([place]) 01/18/2018  . 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 Hypertension  06/26/2017  . Acute kidney injury superimposed on chronic kidney disease ([place]) 06/25/2017  . 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 Left retinal detachment   . 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 Bilateral cataracts 04/08/2017  . DM (diabetes mellitus), type 2 with renal complications ([place])   . Alcohol abuse   . Poor vision   . Anemia of chronic disease 02/13/2017  . Diffuse brain atrophy ([place]) 02/13/2017  . Epigastric pain 02/11/2017  . Fall at home 11/16/2013  . Closed TBI (traumatic brain injury) ([place]) 11/16/2013  . SDH (subdural hematoma) ([place]) 11/15/2013    Past Surgical History:  Procedure Laterality Date  . MEMBRANE PEEL Right 09/29/2017   Procedure: MEMBRANE PEEL WITH AIR GAS;  Surgeon: Jalene Mullet, MD;  Location: [facility];  Service: Ophthalmology;  Laterality: Right;  . PARS PLANA VITRECTOMY Left 06/29/2017   Procedure: PARS PLANA VITRECTOMY WITH 25 GAUGE AND MEMBRANE PEEL WITH GAS EXCHANGE, AIR SILICONE OIL AND PHACO COAGULATION, LASER;  Surgeon: Jalene Mullet, MD;  Location: [facility];  Service: Ophthalmology;  Laterality: Left;  . PARS PLANA VITRECTOMY Right 09/18/2017   Procedure: PARS PLANA VITRECTOMY WITH 25 GAUGE MEMBRANE PILL WITH AIR GAS SILICONE OIL AND PHOTOCOAGULATION;  Surgeon: Jalene Mullet, MD;  Location: [facility];  Service: Ophthalmology;  Laterality: Right;  . PARS PLANA VITRECTOMY Right 09/29/2017   Procedure: PARS PLANA VITRECTOMY WITH 25 GAUGE;  Surgeon: Jalene Mullet, MD;  Location: [facility];  Service: Ophthalmology;  Laterality: Right;  . PHOTOCOAGULATION Right 09/29/2017   Procedure: PHOTOCOAGULATION;  Surgeon: Jalene Mullet, MD;  Location: [facility];  Service: Ophthalmology;  Laterality: Right;  . REPAIR OF COMPLEX TRACTION RETINAL DETACHMENT Right 09/29/2017   Procedure: REPAIR OF COMPLEX TRACTION RETINAL DETACHMENT;  Surgeon: Jalene Mullet, MD;  Location: [facility];  Service: Ophthalmology;  Laterality: Right;        Home  Medications    Prior to Admission medications   Medication Sig Start Date End Date Taking? Authorizing Provider  acetaminophen (TYLENOL) 325 MG tablet Take 650 mg by mouth every 6 (six) hours as needed (general discomfort).   Yes [provider]  albuterol (PROVENTIL HFA;VENTOLIN HFA) 108 (90 Base) MCG/ACT inhaler Inhale 2 puffs into the lungs every 6 (six) hours as needed for wheezing or shortness of breath. 08/24/17  Yes [name], PA-C  amLODipine (NORVASC) 10 MG tablet Take 1 tablet (10 mg total) by mouth daily. 08/24/17  Yes [name], PA-C  aspirin 81 MG chewable tablet Chew 1 tablet (81 mg total) by mouth daily. 08/24/17  Yes [name], PA-C  carvedilol (COREG) 6.25 MG tablet Take 1 tablet (6.25 mg total) by mouth 2 (two) times daily with a meal. 08/24/17  Yes [name], PA-C  Difluprednate (DUREZOL) 0.05 % EMUL Place 1 drop into the right eye 4 (four) times daily.   Yes [provider]  dorzolamide-timolol (COSOPT) 22.3-6.8 MG/ML ophthalmic solution Place 1 drop into the right eye 2 (two) times daily.   Yes [provider]  ferrous gluconate (FERGON) 324 MG tablet Take 1 tablet (324 mg total) by mouth daily with breakfast. 08/24/17  Yes [name], PA-C  folic acid (FOLVITE) 1 MG tablet Take 1 tablet (1 mg total) by mouth daily. 08/24/17  Yes [name], PA-C  hydrALAZINE (APRESOLINE) 25 MG tablet Take 25 mg by mouth every 8 (eight) hours.   Yes [provider]  isosorbide mononitrate (IMDUR) 30 MG 24 hr tablet Take 1 tablet (30 mg total) by mouth daily. Patient taking differently: Take 30 mg by mouth at bedtime.  11/30/17  Yes [name], PA-C  ketorolac (ACULAR) 0.4 % SOLN Place 1 drop into the right eye 4 (four) times daily.   Yes [provider]  omega-3 acid ethyl esters (LOVAZA) 1 g capsule Take 2 capsules (2 g total) by mouth 2 (two) times daily. 11/30/17 02/28/18 Yes [name], PA-C    prednisoLONE acetate (PRED FORTE) 1 % ophthalmic suspension Place 1 drop into the right eye 4 (four) times daily.   Yes [provider]  rosuvastatin (CRESTOR) 5 MG tablet Take 5 mg by mouth every evening.   Yes [provider]  sodium bicarbonate 650 MG tablet Take 1 tablet (650 mg total) by mouth 2 (two) times daily. 12/06/17  Yes Regalado, Belkys A, MD  tamsulosin (FLOMAX) 0.4 MG CAPS capsule Take 1 capsule (0.4 mg total) by mouth daily. Patient taking differently: Take 0.4 mg by mouth every evening.  12/07/17  Yes Regalado, Belkys A, MD  thiamine (VITAMIN B-1) 100 MG tablet Take 100 mg by mouth daily.   Yes [provider]  clopidogrel (PLAVIX) 75 MG tablet Take 1 tablet (75 mg total) by mouth daily. Take through 9/23, then take aspirin alone. Patient not taking: Reported on 02/20/2018 01/22/18   Samuella Cota, MD  Respiratory Therapy Supplies (FLUTTER) DEVI 1 Device by Does  not apply route as directed. 01/05/18   Garner Nash, DO    Family History Family History  Problem Relation Age of Onset  . Lung disease Neg Hx     Social History Social History   Tobacco Use  . Smoking status: Former Smoker    Types: Cigarettes    Last attempt to quit: 05/19/1990    Years since quitting: 27.7  . Smokeless tobacco: Never Used  Substance Use Topics  . 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 Drug use: No     Allergies   Patient has no known allergies.   Review of Systems Review of Systems  Unable to perform ROS: Dementia     Physical Exam Updated Vital Signs BP (!) 178/70 (BP Location: Left Arm)   Pulse 74   Temp 97.9 F (36.6 C)   Resp 15   Wt 47.9 kg   SpO2 100%   BMI 19.31 kg/m   Physical Exam  Constitutional:  Elderly Asian male. Smells of urine.  HENT:  Head: Normocephalic and atraumatic.  Dry mucous membranes. Oropharynx clear.  Facial droop.  Eyes:   Anisocoria as R pupil > L pupil.  R pupil is extending out to the 9 o'clock portion and nonreactive. L pupil is 5mm and reactive.  Unable to cooperate with ocular movements.   Neck: Normal range of motion. Neck supple.  Cardiovascular: Normal rate, normal heart sounds and intact distal pulses.  Pulmonary/Chest: Effort normal and breath sounds normal. He has no wheezes.  Abdominal: Soft. Bowel sounds are normal. He exhibits no distension. There is tenderness (mild suprapubic). There is no guarding.  Musculoskeletal: Normal range of motion. He exhibits no edema.  Neurological: He is alert.  Oriented to self only.  Will not follow commands even with the assistance of a [place].  Responds to painful stimuli, moving all 4 extremities.  Neurovascularly intact throughout.  Skin: Skin is warm and dry. Capillary refill takes less than 2 seconds.   Eyes anisco  ED Treatments / Results  Labs (all labs ordered are listed, but only abnormal results are displayed) Labs Reviewed  CBC WITH DIFFERENTIAL/PLATELET - Abnormal; Notable for the following components:      Result Value   WBC 10.6 (*)    RBC 3.73 (*)    Hemoglobin 9.0 (*)    HCT 29.8 (*)    MCH 24.1 (*)    Eosinophils Absolute 0.8 (*)    All other components within normal limits  COMPREHENSIVE METABOLIC PANEL - Abnormal; Notable for the following components:   Potassium 5.4 (*)    Glucose, Bld 376 (*)    BUN 43 (*)    Creatinine, Ser 2.71 (*)    Calcium 8.4 (*)    Albumin 3.3 (*)    Alkaline Phosphatase 128 (*)    GFR calc non Af Amer 21 (*)    GFR calc Af Amer 24 (*)    All other components within normal limits  URINALYSIS, ROUTINE W REFLEX MICROSCOPIC - Abnormal; Notable for the following components:   Color, Urine STRAW (*)    Glucose, UA >=500 (*)    Protein, ur 100 (*)    All other components within normal limits  ACETAMINOPHEN LEVEL - Abnormal; Notable for the following components:   Acetaminophen (Tylenol),  Serum <10 (*)    All other components within normal limits  CBG MONITORING, ED - Abnormal;  Notable for the following components:   Glucose-Capillary 312 (*)    All other components within normal limits  URINE CULTURE  AMMONIA  ETHANOL  SALICYLATE LEVEL  RAPID URINE DRUG SCREEN, HOSP PERFORMED  HEMOGLOBIN A1C  LIPID PANEL  BASIC METABOLIC PANEL    EKG EKG Interpretation  Date/Time:  Saturday February 20 2018 17:21:45 EDT Ventricular Rate:  70 PR Interval:    QRS Duration: 95 QT Interval:  384 QTC Calculation: 415 R Axis:   81 Text Interpretation:  Sinus rhythm Borderline right axis deviation Left ventricular hypertrophy overall similar previous Confirmed by Elnora Morrison [phone]) on 02/20/2018 7:27:35 PM   Radiology Dg Chest 2 View  Result Date: 02/20/2018 CLINICAL DATA:  Altered mental status. EXAM: CHEST - 2 VIEW COMPARISON:  01/18/2018 and older exams. FINDINGS: Cardiac silhouette top-normal in size. No mediastinal or hilar masses. No evidence of adenopathy. Chronic scarring at the left lung base. Chronic bilateral lower lung zone bronchitic changes. Bronchovascular markings are prominent bilaterally. No evidence pneumonia or pulmonary edema. No pleural effusion or pneumothorax. Skeletal structures are demineralized but intact. IMPRESSION: No acute cardiopulmonary disease. Electronically Signed   By: Lajean Manes M.D.   On: 02/20/2018 18:07   Ct Head Wo Contrast  Result Date: 02/20/2018 CLINICAL DATA:  Left sided facial droop with altered mental status. Last seen normal sometime yesterday. EXAM: CT HEAD WITHOUT CONTRAST TECHNIQUE: Contiguous axial images were obtained from the base of the skull through the vertex without intravenous contrast. COMPARISON:  Head CT and brain MRI, 01/18/2018. FINDINGS: Brain: No evidence of acute infarction, hemorrhage, hydrocephalus, extra-axial collection or mass lesion/mass effect. Old left PICA distribution cerebellar infarct, small old left  lateral frontal lobe infarct, small lacunar infarct along the posterior limb of the left internal capsule with additional small thalamic lacunar infarcts bilaterally. There is ventricular and sulcal enlargement reflecting mild to moderate atrophy. Other patchy areas of hypoattenuation in the white matter consistent with mild chronic microvascular ischemic change. Vascular: No hyperdense vessel or unexpected calcification. Skull: Normal. Negative for fracture or focal lesion. Sinuses/Orbits: Prosthetic left globe, stable. No acute globe or orbital abnormality. Mild left maxillary sinus mucosal thickening. Other: None. IMPRESSION: 1. No acute intracranial abnormalities. 2. Multiple old infarcts, mild to moderate atrophy and mild chronic microvascular ischemic change. Electronically Signed   By: Lajean Manes M.D.   On: 02/20/2018 18:06    Procedures Procedures (including critical care time)  Medications Ordered in ED Medications  thiamine (VITAMIN B-1) tablet 100 mg (has no administration in time range)  sodium bicarbonate tablet 650 mg (has no administration in time range)  rosuvastatin (CRESTOR) tablet 5 mg (has no administration in time range)  ketorolac (ACULAR) 0.5 % ophthalmic solution 1 drop (1 drop Right Eye Given 02/21/18 0017)  omega-3 acid ethyl esters (LOVAZA) capsule 2 g (has no administration in time range)  prednisoLONE acetate (PRED FORTE) 1 % ophthalmic suspension 1 drop (1 drop Right Eye Given 66/4/40 3474)  folic acid (FOLVITE) tablet 1 mg (has no administration in time range)  aspirin chewable tablet 81 mg (has no administration in time range)  albuterol (PROVENTIL) (2.5 MG/3ML) 0.083% nebulizer solution 3 mL (has no administration in time range)  acetaminophen (TYLENOL) tablet 650 mg (has no administration in time range)  dorzolamide-timolol (COSOPT) 22.3-6.8 MG/ML ophthalmic solution 1 drop (1 drop Right Eye Given 02/21/18 0016)  Difluprednate 0.05 % EMUL 1 drop (1 drop Right Eye  Not Given 02/21/18 0017)  insulin aspart (novoLOG) injection 0-9 Units (  has no administration in time range)  heparin injection 5,000 Units (5,000 Units Subcutaneous Given 02/21/18 0027)  0.9 %  sodium chloride infusion (1,000 mLs Intravenous New Bag/Given 02/21/18 0045)  lactated ringers bolus 1,000 mL (0 mLs Intravenous Stopped 02/20/18 2129)   stroke: mapping our early stages of recovery book ( Does not apply Given 02/21/18 0012)     Initial Impression / Assessment and Plan / ED Course  I have reviewed the triage vital signs and the nursing notes.  Pertinent labs & imaging results that were available during my care of the patient were reviewed by me and considered in my medical decision making (see chart for details).    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  Confusion since yesterday.  Unknown last normal.  On arrival patient HDS.  POC glucose 312.  Exam as above difficulty 2/2 language barrier.  Multiple attempts made at contacting facility and family listed which were all unsuccessful.  Video translator attempted however no further information gleaned.  AMS work-up pursued.  Patient found to have an AKI as Cr 2.71 and BUN 43 likely 2/2 prerenal cause (dehydration).  IVF given.  Labs also significant for hyperkalemia of 5.4 and hyperglycemia of 376.  No evidence for DKA. No other electrolyte abnormalities. Ammonia wnl.  Salicylate, acetaminophen, and EtOH undetectable.  CTH without acute intracranial process.  CXR without PNA or acute cardiopulmonary process.  UA without infection.  UDS negative.  Unclear etiology at this time.  Will need admission for further inpatient work-up and likely MRI given recent admission for multiple embolic strokes of unclear origin.  Discussed case with hospitalist who will  admit.  HDS at transfer.  Final Clinical Impressions(s) / ED Diagnoses   Final diagnoses:  Altered mental status, unspecified altered mental status type    ED Discharge Orders    None       Fabian November, MD 02/21/18 4801    Elnora Morrison, MD 02/22/18 [phone]

## 2018-02-21 ENCOUNTER — Other Ambulatory Visit: Payer: Self-pay

## 2018-02-21 ENCOUNTER — Observation Stay (HOSPITAL_COMMUNITY): Payer: Medicare Other

## 2018-02-21 DIAGNOSIS — G459 Transient cerebral ischemic attack, unspecified: Principal | ICD-10-CM

## 2018-02-21 LAB — BASIC METABOLIC PANEL
ANION GAP: 6 (ref 5–15)
BUN: 34 mg/dL — ABNORMAL HIGH (ref 8–23)
CALCIUM: 8.4 mg/dL — AB (ref 8.9–10.3)
CO2: 23 mmol/L (ref 22–32)
Chloride: 111 mmol/L (ref 98–111)
Creatinine, Ser: 2.22 mg/dL — ABNORMAL HIGH (ref 0.61–1.24)
GFR calc Af Amer: 31 mL/min — ABNORMAL LOW (ref >60)
GFR, EST NON AFRICAN AMERICAN: 26 mL/min — AB (ref >60)
GLUCOSE: 152 mg/dL — AB (ref 70–99)
POTASSIUM: 4.7 mmol/L (ref 3.5–5.1)
Sodium: 140 mmol/L (ref 135–145)

## 2018-02-21 LAB — LIPID PANEL
CHOLESTEROL: 132 mg/dL (ref 0–200)
HDL: 45 mg/dL (ref >40)
LDL CALC: 67 mg/dL (ref 0–99)
TRIGLYCERIDES: 102 mg/dL (ref <150)
Total CHOL/HDL Ratio: 2.9 RATIO
VLDL: 20 mg/dL (ref 0–40)

## 2018-02-21 LAB — HEMOGLOBIN A1C
HEMOGLOBIN A1C: 8.1 % — AB (ref 4.8–5.6)
MEAN PLASMA GLUCOSE: 185.77 mg/dL

## 2018-02-21 LAB — URINE CULTURE: CULTURE: NO GROWTH

## 2018-02-21 LAB — MRSA PCR SCREENING: MRSA by PCR: NEGATIVE

## 2018-02-21 LAB — GLUCOSE, CAPILLARY
GLUCOSE-CAPILLARY: 125 mg/dL — AB (ref 70–99)
GLUCOSE-CAPILLARY: 86 mg/dL (ref 70–99)
GLUCOSE-CAPILLARY: 205 mg/dL — AB (ref 70–99)
Glucose-Capillary: 157 mg/dL — ABNORMAL HIGH (ref 70–99)

## 2018-02-21 MED ORDER — HYDRALAZINE HCL 25 MG PO TABS
25.0000 mg | ORAL_TABLET | Freq: Three times a day (TID) | ORAL | Status: DC
  Administered 2018-02-21 – 2018-02-23 (×6): 25 mg via ORAL
  Filled 2018-02-21 (×6): qty 1

## 2018-02-21 MED ORDER — TAMSULOSIN HCL 0.4 MG PO CAPS
0.4000 mg | ORAL_CAPSULE | Freq: Every evening | ORAL | Status: DC
  Administered 2018-02-21 – 2018-02-22 (×2): 0.4 mg via ORAL
  Filled 2018-02-21 (×2): qty 1

## 2018-02-21 MED ORDER — CARVEDILOL 6.25 MG PO TABS
6.2500 mg | ORAL_TABLET | Freq: Two times a day (BID) | ORAL | Status: DC
  Administered 2018-02-21 – 2018-02-23 (×4): 6.25 mg via ORAL
  Filled 2018-02-21 (×4): qty 1

## 2018-02-21 MED ORDER — AMLODIPINE BESYLATE 10 MG PO TABS
10.0000 mg | ORAL_TABLET | Freq: Every day | ORAL | Status: DC
  Administered 2018-02-21 – 2018-02-23 (×3): 10 mg via ORAL
  Filled 2018-02-21 (×3): qty 1

## 2018-02-21 MED ORDER — INSULIN ASPART 100 UNIT/ML ~~LOC~~ SOLN
0.0000 [IU] | Freq: Three times a day (TID) | SUBCUTANEOUS | Status: DC
  Administered 2018-02-22: 2 [IU] via SUBCUTANEOUS
  Administered 2018-02-23: 1 [IU] via SUBCUTANEOUS
  Administered 2018-02-23: 3 [IU] via SUBCUTANEOUS

## 2018-02-21 MED ORDER — ISOSORBIDE MONONITRATE ER 30 MG PO TB24
30.0000 mg | ORAL_TABLET | Freq: Every day | ORAL | Status: DC
  Administered 2018-02-21 – 2018-02-22 (×2): 30 mg via ORAL
  Filled 2018-02-21 (×2): qty 1

## 2018-02-21 MED ORDER — FERROUS GLUCONATE 324 (38 FE) MG PO TABS
324.0000 mg | ORAL_TABLET | Freq: Every day | ORAL | Status: DC
  Administered 2018-02-22 – 2018-02-23 (×2): 324 mg via ORAL
  Filled 2018-02-21 (×2): qty 1

## 2018-02-21 NOTE — NC FL2 (Signed)
[place] MEDICAID FL2 LEVEL OF CARE SCREENING TOOL     IDENTIFICATION  Patient Name: Timothy [name] Birthdate: 1940/12/23 Sex: male Admission Date (Current Location): 02/20/2018  [facility] and [place] Number:  Herbalist and Address:  [facility], [place] [address] [place], [place] [address]      Provider Number: [id number]  Attending Physician Name and Address:  Lady Deutscher, MD  Relative Name and Phone Number:       Current Level of Care: Hospital Recommended Level of Care: Assisted Living Facility(Assisted living facility) Prior Approval Number:    Date Approved/Denied:   PASRR Number:    Discharge Plan: Other (Comment)(Assisted living facility)    Current Diagnoses: Patient Active Problem List   Diagnosis Date Noted  . TIA (transient ischemic attack) 02/20/2018  . CKD (chronic kidney disease), stage III ([place]) 01/20/2018  . CVA (cerebral vascular accident) ([place]) 01/18/2018  . 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 Hypertension 06/26/2017  . Acute kidney injury superimposed on chronic kidney disease ([place]) 06/25/2017  . 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 Left retinal detachment   . 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 Bilateral cataracts 04/08/2017  . DM (diabetes mellitus), type 2 with renal complications ([place])   . Alcohol abuse   . Poor vision   . Anemia of chronic disease 02/13/2017  . Diffuse brain atrophy ([place]) 02/13/2017  . Epigastric pain 02/11/2017  . Fall at home 11/16/2013  . Closed TBI (traumatic brain injury) ([place]) 11/16/2013  . SDH (subdural hematoma) ([facility]) 11/15/2013    Orientation RESPIRATION BLADDER Height & Weight     (disoriented x4)  Normal External catheter, Incontinent(placed 02/20/18) Weight: 105 lb 9.6 oz (47.9 kg) Height:     BEHAVIORAL SYMPTOMS/MOOD NEUROLOGICAL BOWEL NUTRITION STATUS      Continent Diet(DYS 2 diet, thin liquids)  AMBULATORY STATUS COMMUNICATION OF NEEDS  Skin   Limited Assist   Normal                       Personal Care Assistance Level of Assistance  Bathing, Feeding, Dressing Bathing Assistance: Limited assistance Feeding assistance: Independent Dressing Assistance: Limited assistance     Functional Limitations Info  Sight, Hearing, Speech Sight Info: Adequate Hearing Info: Adequate Speech Info: Adequate    SPECIAL CARE FACTORS FREQUENCY                       Contractures Contractures Info: Not present    Additional Factors Info  Code Status, Allergies Code Status Info: Full Code Allergies Info: No known allergies           Current Medications (02/21/2018):  This is the current hospital active medication list Current Facility-Administered Medications  Medication Dose Route Frequency Provider Last Rate Last Dose  . 0.9 %  sodium chloride infusion   Intravenous Continuous Etta Quill, DO 50 mL/hr at 02/21/18 0045 1,000 mL at 02/21/18 0045  . acetaminophen (TYLENOL) tablet 650 mg  650 mg Oral Q6H PRN Etta Quill, DO      . albuterol (PROVENTIL) (2.5 MG/3ML) 0.083% nebulizer solution 3 mL  3 mL Inhalation Q6H PRN Etta Quill, DO      . amLODipine (NORVASC) tablet 10 mg  10 mg Oral Daily Lady Deutscher, MD      . aspirin chewable tablet 81 mg  81 mg Oral Daily Alcario Drought, Jared M, DO   81 mg  at 02/21/18 1100  . carvedilol (COREG) tablet 6.25 mg  6.25 mg Oral BID WC Lady Deutscher, MD      . Difluprednate 0.05 % EMUL 1 drop  1 drop Right Eye QID Alcario Drought, Jared M, DO      . dorzolamide-timolol (COSOPT) 22.3-6.8 MG/ML ophthalmic solution 1 drop  1 drop Right Eye BID Jennette Kettle M, DO   1 drop at 02/21/18 1102  . [START ON 02/22/2018] ferrous gluconate (FERGON) tablet 324 mg  324 mg Oral Q breakfast Lady Deutscher, MD      . folic acid (FOLVITE) tablet 1 mg  1 mg Oral Daily Jennette Kettle M, DO   1 mg at 02/21/18 1100  . heparin injection 5,000 Units  5,000 Units Subcutaneous Q8H Etta Quill, DO   5,000 Units at 02/21/18 0605  . hydrALAZINE (APRESOLINE) tablet 25 mg  25 mg Oral Q8H Lady Deutscher, MD      . insulin aspart (novoLOG) injection 0-9 Units  0-9 Units Subcutaneous Q4H Etta Quill, DO   1 Units at 02/21/18 1222  . isosorbide mononitrate (IMDUR) 24 hr tablet 30 mg  30 mg Oral QHS Lady Deutscher, MD      . ketorolac (ACULAR) 0.5 % ophthalmic solution 1 drop  1 drop Right Eye QID Jennette Kettle M, DO   1 drop at 02/21/18 1101  . omega-3 acid ethyl esters (LOVAZA) capsule 2 g  2 g Oral BID Jennette Kettle M, DO   2 g at 02/21/18 1100  . prednisoLONE acetate (PRED FORTE) 1 % ophthalmic suspension 1 drop  1 drop Right Eye QID Jennette Kettle M, DO   1 drop at 02/21/18 1101  . rosuvastatin (CRESTOR) tablet 5 mg  5 mg Oral QPM Jennette Kettle M, DO      . sodium bicarbonate tablet 650 mg  650 mg Oral BID Jennette Kettle M, DO   650 mg at 02/21/18 1100  . tamsulosin (FLOMAX) capsule 0.4 mg  0.4 mg Oral QPM Lady Deutscher, MD      . thiamine (VITAMIN B-1) tablet 100 mg  100 mg Oral Daily Alcario Drought, Jared M, DO   100 mg at 02/21/18 1100     Discharge Medications: Please see discharge summary for a list of discharge medications.  Relevant Imaging Results:  Relevant Lab Results:   Additional Information SN: [id number]  Eileen Stanford, LCSW

## 2018-02-21 NOTE — Progress Notes (Signed)
PROGRESS NOTE    Timothy [name]  YOV:[id number] DOB: 1940/06/27 DOA: 02/20/2018 PCP: [name], PA-C    Brief Narrative:  Timothy [name] is a 77 y.o. male with medical history significant of DM2, HTN, legally blind.  Patient was admitted to hospital last month for multiple embolic strokes with R hemianopsia. Patient was sent in from ALF today after staff noticed that he had facial droop (not quite clear which side) and LEFT arm weakness. At ALF there is one resident that speaks his language: [name].  In the ED, patient in no distress, but further history taking from patient not possible due to language barrier, he speaks [name], and doesn't understand [place]. BGL 312.  Creat 2.7 (1.8 at discharge last month).  CT head neg. he was admitted for evaluation for stroke work-up.  MRI of the head did not show an acute stroke.  Assessment & Plan:   Principal Problem:   Stroke-like symptoms Active Problems:   DM (diabetes mellitus), type 2 with renal complications (HCC)   Legally blind   Hypertension   CKD (chronic kidney disease), stage III ([place])  1. TIA 1. Difficult as we cant get much of an exam or history from patient due to language barrier 2. MRI brain needed for acute stroke 3. Neuro consult not necessary 4. Unable to find a Optometrist.  Hopefully one will be here tomorrow. 5. Continue ASA 81, presumably neurology may add Plavix back 6. PT/OT/SLP 7. Will hold off on repeating 2d echo, carotid duplex as these were just recently done  8. tele monitor 9. Continue statin   2. DM2 - 1. Sensitive SSI Q4H for now 2.  3. HTN -start home blood pressure medications 4. CKD stage 3 - 1. Creat slightly above baseline 2. IVF 1L in ED 3. Repeat BMP in AM  DVT prophylaxis: Heparin [place] Code Status: Full Family Communication: No family in room, not able to get a-hold of anyone by phone Disposition Plan: TBD Consults called: Neurology gust patient by  phone did not feel neuro consult warranted due to negative MRI Admission status:  Continue observation    Subjective: Patient lying in bed comfortable.  Not indicate any pain  Objective: Vitals:   02/21/18 0442 02/21/18 0500 02/21/18 0812 02/21/18 1240  BP: (!) 156/58  (!) 145/65 (!) 152/56  Pulse: 60 (!) 59 72 65  Resp: 13 20 15 14   Temp: 98.5 F (36.9 C)  97.7 F (36.5 C) (!) 97.5 F (36.4 C)  TempSrc: Oral  Axillary Oral  SpO2: 100% 100% 100%   Weight:        Intake/Output Summary (Last 24 hours) at 02/21/2018 1424 Last data filed at 02/21/2018 1416 Gross per 24 hour  Intake -  Output 550 ml  Net -550 ml   Filed Weights   02/20/18 2300  Weight: 47.9 kg    Examination:  General exam: Appears calm and comfortable  Respiratory system: Clear to auscultation. Respiratory effort normal. Cardiovascular system: S1 & S2 heard, RRR. No JVD, murmurs, rubs, gallops or clicks. No pedal edema. Gastrointestinal system: Abdomen is nondistended, soft and nontender. No organomegaly or masses felt. Normal bowel sounds heard. Central nervous system: Alert and oriented. No focal neurological deficits. Extremities: Symmetric 5 x 5 power. Skin: No rashes, lesions or ulcers Psychiatry: Judgement and insight appear normal. Mood & affect appropriate.     Data Reviewed: I have personally reviewed following labs and imaging studies  CBC: Recent Labs  Lab 02/20/18 1739  WBC 10.6*  NEUTROABS 6.7  HGB 9.0*  HCT 29.8*  MCV 79.9  PLT 008   Basic Metabolic Panel: Recent Labs  Lab 02/20/18 1739 02/21/18 0820  NA 136 140  K 5.4* 4.7  CL 106 111  CO2 24 23  GLUCOSE 376* 152*  BUN 43* 34*  CREATININE 2.71* 2.22*  CALCIUM 8.4* 8.4*   GFR: Estimated Creatinine Clearance: 18.3 mL/min (A) (by C-G formula based on SCr of 2.22 mg/dL (H)). Liver Function Tests: Recent Labs  Lab 02/20/18 1739  AST 32  ALT 24  ALKPHOS 128*  BILITOT 0.3  PROT 6.5  ALBUMIN 3.3*   No results  for input(s): LIPASE, AMYLASE in the last 168 hours. Recent Labs  Lab 02/20/18 1740  AMMONIA 19   Coagulation Profile: No results for input(s): INR, PROTIME in the last 168 hours. Cardiac Enzymes: No results for input(s): CKTOTAL, CKMB, CKMBINDEX, TROPONINI in the last 168 hours. BNP (last 3 results) No results for input(s): PROBNP in the last 8760 hours. HbA1C: Recent Labs    02/21/18 0820  HGBA1C 8.1*   CBG: Recent Labs  Lab 02/20/18 1746 02/21/18 0553 02/21/18 1151  GLUCAP 312* 157* 125*   Lipid Profile: Recent Labs    02/21/18 0820  CHOL 132  HDL 45  LDLCALC 67  TRIG 102  CHOLHDL 2.9   Thyroid Function Tests: No results for input(s): TSH, T4TOTAL, FREET4, T3FREE, THYROIDAB in the last 72 hours. Anemia Panel: No results for input(s): VITAMINB12, FOLATE, FERRITIN, TIBC, IRON, RETICCTPCT in the last 72 hours. Sepsis Labs: No results for input(s): PROCALCITON, LATICACIDVEN in the last 168 hours.  Recent Results (from the past 240 hour(s))  MRSA PCR Screening     Status: None   Collection Time: 02/21/18  8:15 AM  Result Value Ref Range Status   MRSA by PCR NEGATIVE NEGATIVE Final    Comment:        The GeneXpert MRSA Assay (FDA approved for NASAL specimens only), is one component of a comprehensive MRSA colonization surveillance program. It is not intended to diagnose MRSA infection nor to guide or monitor treatment for MRSA infections. Performed at [facility], [place] [address] [place], [place] [address]          Radiology Studies: Dg Chest 2 View  Result Date: 02/20/2018 CLINICAL DATA:  Altered mental status. EXAM: CHEST - 2 VIEW COMPARISON:  01/18/2018 and older exams. FINDINGS: Cardiac silhouette top-normal in size. No mediastinal or hilar masses. No evidence of adenopathy. Chronic scarring at the left lung base. Chronic bilateral lower lung zone bronchitic changes. Bronchovascular markings are prominent bilaterally. No evidence pneumonia  or pulmonary edema. No pleural effusion or pneumothorax. Skeletal structures are demineralized but intact. IMPRESSION: No acute cardiopulmonary disease. Electronically Signed   By: Lajean Manes M.D.   On: 02/20/2018 18:07   Ct Head Wo Contrast  Result Date: 02/20/2018 CLINICAL DATA:  Left sided facial droop with altered mental status. Last seen normal sometime yesterday. EXAM: CT HEAD WITHOUT CONTRAST TECHNIQUE: Contiguous axial images were obtained from the base of the skull through the vertex without intravenous contrast. COMPARISON:  Head CT and brain MRI, 01/18/2018. FINDINGS: Brain: No evidence of acute infarction, hemorrhage, hydrocephalus, extra-axial collection or mass lesion/mass effect. Old left PICA distribution cerebellar infarct, small old left lateral frontal lobe infarct, small lacunar infarct along the posterior limb of the left internal capsule with additional small thalamic lacunar infarcts bilaterally. There is ventricular and sulcal enlargement reflecting mild to  moderate atrophy. Other patchy areas of hypoattenuation in the white matter consistent with mild chronic microvascular ischemic change. Vascular: No hyperdense vessel or unexpected calcification. Skull: Normal. Negative for fracture or focal lesion. Sinuses/Orbits: Prosthetic left globe, stable. No acute globe or orbital abnormality. Mild left maxillary sinus mucosal thickening. Other: None. IMPRESSION: 1. No acute intracranial abnormalities. 2. Multiple old infarcts, mild to moderate atrophy and mild chronic microvascular ischemic change. Electronically Signed   By: Lajean Manes M.D.   On: 02/20/2018 18:06   Mr Brain Wo Contrast  Result Date: 02/21/2018 CLINICAL DATA:  77 y/o M; left-sided facial droop and altered mental status. EXAM: MRI HEAD WITHOUT CONTRAST TECHNIQUE: Multiplanar, multiecho pulse sequences of the brain and surrounding structures were obtained without intravenous contrast. COMPARISON:  02/20/2018 CT head.   01/18/2018 MRI head. FINDINGS: Brain: No acute infarction, hemorrhage, hydrocephalus, extra-axial collection or mass lesion. Stable hemosiderin stained chronic infarctions of the left frontal cortex, right occipital cortex, and left inferior cerebellum. Stable chronic infarct in the right external capsule. Stable moderate chronic microvascular ischemic changes of white matter and volume loss of the brain. Stable small chronic lacunar infarcts within left hemi pons, bilateral thalami, and the right caudate head. Vascular: Normal flow voids.  Stable hemosiderin stained Skull and upper cervical spine: Normal marrow signal. Sinuses/Orbits: Mild ethmoid and left maxillary sinus mucosal thickening. Normal aeration of the mastoid air cells. Right intra-ocular lens replacement. Stable postoperative appearance of the globes bilaterally. Other: None. IMPRESSION: 1. No acute intracranial abnormality identified. 2. Multiple stable chronic infarctions, chronic microvascular ischemic changes, and volume loss of the brain. Electronically Signed   By: Kristine Garbe M.D.   On: 02/21/2018 03:47        Scheduled Meds: . aspirin  81 mg Oral Daily  . Difluprednate  1 drop Right Eye QID  . dorzolamide-timolol  1 drop Right Eye BID  . folic acid  1 mg Oral Daily  . heparin  5,000 Units Subcutaneous Q8H  . insulin aspart  0-9 Units Subcutaneous Q4H  . ketorolac  1 drop Right Eye QID  . omega-3 acid ethyl esters  2 g Oral BID  . prednisoLONE acetate  1 drop Right Eye QID  . rosuvastatin  5 mg Oral QPM  . sodium bicarbonate  650 mg Oral BID  . thiamine  100 mg Oral Daily   Continuous Infusions: . sodium chloride 1,000 mL (02/21/18 0045)     LOS: 0 days    Time spent: 35 minutes    Lady Deutscher, MD, FACP [facility] Pager [phone]  If 7PM-7AM, please contact night-coverage [email or web address] Password [email or web address] 02/21/2018, 2:24 PM

## 2018-02-21 NOTE — Evaluation (Signed)
Occupational Therapy Evaluation Patient Details Name: Timothy [name] MRN: [id number] DOB: [birth year] Today's Date: 02/21/2018    History of Present Illness Pt is a 77 y.o. male with medical history significant of DM2, HTN, legally blind.  Patient was admitted to hospital last month for multiple embolic strokes with R hemianopsia.Patient was admitted from ALF after staff noticed that he had facial droop (not quite clear which side) and LEFT arm weakness. MRI negative for acute infarcts.    Clinical Impression   Pt admitted with the above diagnoses and presents with below problem list. Pt will benefit from continued acute OT to address the below listed deficits and maximize independence with basic ADLs prior to d/c back to SNF. From therapy note 01/19/18: PTA pt used cane; had help with bathing/dressing from staff as needed; walked to the dining room. Pt presents with generalized weakness and impaired balance impacting ADLs. Difficult to assess cognitive status due to language barrier. Pt speaks [place], no interpreter available during OT eval. Pt will benefit from continued rehab back at [facility] after d/c. Plan to follow acutely as well.      Follow Up Recommendations  SNF    Equipment Recommendations  None recommended by OT    Recommendations for Other Services       Precautions / Restrictions Precautions Precautions: Fall Precaution Comments: legally blind, R hemianopsia from CVA 1 month ago Restrictions Weight Bearing Restrictions: No      Mobility Bed Mobility Overal bed mobility: Needs Assistance Bed Mobility: Rolling;Sidelying to Sit Rolling: Min assist Sidelying to sit: Min assist       General bed mobility comments: Min assist for initiation of movement  Transfers Overall transfer level: Needs assistance Equipment used: 1 person hand held assist Transfers: Sit to/from Stand Sit to Stand: Min assist         General transfer comment: min assist for stability to  power to upright in standing    Balance Overall balance assessment: Needs assistance Sitting-balance support: Feet supported Sitting balance-Leahy Scale: Fair Sitting balance - Comments: able to sit EOB    Standing balance support: During functional activity Standing balance-Leahy Scale: Fair Standing balance comment: able to release UE support while performing functional tasks at sink                           ADL either performed or assessed with clinical judgement   ADL Overall ADL's : Needs assistance/impaired Eating/Feeding: NPO;Sitting   Grooming: Minimal assistance;Sitting   Upper Body Bathing: Minimal assistance;Sitting   Lower Body Bathing: Minimal assistance;Sit to/from stand   Upper Body Dressing : Minimal assistance;Sitting   Lower Body Dressing: Minimal assistance;Sit to/from stand   Toilet Transfer: Minimal assistance;Ambulation   Toileting- Clothing Manipulation and Hygiene: Minimal assistance   Tub/ Shower Transfer: Minimal assistance   Functional mobility during ADLs: Minimal assistance General ADL Comments: Pt completed bed mobility, grooming task at sink, and in-room functional mobility.     Vision Baseline Vision/History: Legally blind Additional Comments: R hemianopsia from CVA 1 month ago     Perception     Praxis      Pertinent Vitals/Pain Pain Assessment: Faces Faces Pain Scale: No hurt     Hand Dominance Right   Extremity/Trunk Assessment Upper Extremity Assessment Upper Extremity Assessment: Generalized weakness   Lower Extremity Assessment Lower Extremity Assessment: Generalized weakness       Communication Communication Communication: Prefers language other than English   Cognition Arousal/Alertness:  Awake/alert Behavior During Therapy: WFL for tasks assessed/performed Overall Cognitive Status: No family/caregiver present to determine baseline cognitive functioning                                      General Comments       Exercises     Shoulder Instructions      Home Living Family/patient expects to be discharged to:: Skilled nursing facility                                        Prior Functioning/Environment Level of Independence: Independent with assistive device(s)        Comments: From therapy note during last admission 01/19/18: used cane; had help with bathing/dressing from staff as needed; walked to the dining room        OT Problem List: Impaired balance (sitting and/or standing);Decreased knowledge of use of DME or AE;Decreased knowledge of precautions;Impaired vision/perception;Decreased activity tolerance;Decreased strength      OT Treatment/Interventions: Self-care/ADL training;Therapeutic exercise;Neuromuscular education;DME and/or AE instruction;Therapeutic activities;Patient/family education;Balance training    OT Goals(Current goals can be found in the care plan section) Acute Rehab OT Goals Patient Stated Goal: none stated Time For Goal Achievement: 02/28/18 Potential to Achieve Goals: Good ADL Goals Pt Will Perform Grooming: with set-up;sitting Pt Will Perform Upper Body Bathing: with set-up;sitting Pt Will Perform Lower Body Bathing: with min guard assist;sit to/from stand Pt Will Perform Upper Body Dressing: with set-up;sitting Pt Will Perform Lower Body Dressing: with min guard assist;sit to/from stand Pt Will Transfer to Toilet: with min guard assist;ambulating Pt Will Perform Toileting - Clothing Manipulation and hygiene: with min guard assist  OT Frequency: Min 2X/week   Barriers to D/C:            Co-evaluation PT/OT/SLP Co-Evaluation/Treatment: Yes Reason for Co-Treatment: To address functional/ADL transfers;For patient/therapist safety   OT goals addressed during session: ADL's and self-care      AM-PAC PT "6 Clicks" Daily Activity     Outcome Measure Help from another person eating meals?: None Help from  another person taking care of personal grooming?: A Little Help from another person toileting, which includes using toliet, bedpan, or urinal?: A Little Help from another person bathing (including washing, rinsing, drying)?: A Little Help from another person to put on and taking off regular upper body clothing?: A Little Help from another person to put on and taking off regular lower body clothing?: A Little 6 Click Score: 19   End of Session Equipment Utilized During Treatment: Gait belt Nurse Communication: Mobility status  Activity Tolerance: Patient tolerated treatment well Patient left: in chair;with call bell/phone within reach;with chair alarm set  OT Visit Diagnosis: Unsteadiness on feet (R26.81);Other abnormalities of gait and mobility (R26.89);Muscle weakness (generalized) (M62.81);Low vision, both eyes (H54.2)                Time: 3220-2542 OT Time Calculation (min): 19 min Charges:  OT General Charges $OT Visit: 1 Visit OT Evaluation $OT Eval Low Complexity: [place], OT Acute Rehabilitation Services Pager: [phone] Office: [phone]   Hortencia Pilar 02/21/2018, 11:34 AM

## 2018-02-21 NOTE — Progress Notes (Signed)
Was able to get in touch with Timothy [name], Timothy [name] "Timothy [name]" his social worker at the local [employer].  1) she was able to translate for [name] and talk with patient: to tell him the basics (he was sent in to [facility] because SNF was worried that he was having another stroke, MRI negative for new stroke, patient replied back that he knew he was in hospital and felt okay, apparently he understands a decent amount of english, just doesn't speak it well). 2) Translator available to come in tomorrow (Monday).

## 2018-02-21 NOTE — Evaluation (Signed)
Physical Therapy Evaluation Patient Details Name: Timothy [name] MRN: [id number] DOB: [birth year] Today's Date: 02/21/2018   History of Present Illness  Pt is a 77 y.o. male with medical history significant of DM2, HTN, legally blind.  Patient was admitted to hospital last month for multiple embolic strokes with R hemianopsia.Patient was admitted from ALF after staff noticed that he had facial droop (not quite clear which side) and LEFT arm weakness. MRI negative for acute infarcts.   Clinical Impression  Orders received for PT evaluation. Patient demonstrates deficits in functional mobility as indicated below. Will benefit from continued skilled PT to address deficits and maximize function. Will see as indicated and progress as tolerated.     Follow Up Recommendations (return to ALF and resume previous therapies)    Equipment Recommendations  None recommended by PT    Recommendations for Other Services       Precautions / Restrictions Precautions Precautions: Fall Precaution Comments: legally blind Restrictions Weight Bearing Restrictions: No      Mobility  Bed Mobility Overal bed mobility: Needs Assistance Bed Mobility: Rolling;Sidelying to Sit Rolling: Min assist Sidelying to sit: Min assist       General bed mobility comments: Min assist for initiation of movement  Transfers Overall transfer level: Needs assistance Equipment used: 1 person hand held assist Transfers: Sit to/from Stand Sit to Stand: Min assist         General transfer comment: min assist for stability to power to upright in standing  Ambulation/Gait Ambulation/Gait assistance: Min assist Gait Distance (Feet): 18 Feet Assistive device: 1 person hand held assist Gait Pattern/deviations: Step-through pattern;Decreased stride length;Shuffle;Narrow base of support Gait velocity: decreased   General Gait Details: Min assist for stability and guidance  Stairs            Wheelchair Mobility     Modified Rankin (Stroke Patients Only) Modified Rankin (Stroke Patients Only) Pre-Morbid Rankin Score: Moderate disability Modified Rankin: Moderately severe disability     Balance Overall balance assessment: Needs assistance Sitting-balance support: Feet supported Sitting balance-Leahy Scale: Fair Sitting balance - Comments: able to sit EOB    Standing balance support: During functional activity Standing balance-Leahy Scale: Fair Standing balance comment: able to release UE support while performing functional tasks at sink                             Pertinent Vitals/Pain Pain Assessment: Faces Faces Pain Scale: No hurt    Home Living Family/patient expects to be discharged to:: Skilled nursing facility                      Prior Function Level of Independence: Independent with assistive device(s)         Comments: From therapy note during last admission 01/19/18: used cane; had help with bathing/dressing from staff as needed; walked to the dining room     Hand Dominance   Dominant Hand: Right    Extremity/Trunk Assessment   Upper Extremity Assessment Upper Extremity Assessment: Generalized weakness;Difficult to assess due to impaired cognition    Lower Extremity Assessment Lower Extremity Assessment: Generalized weakness       Communication   Communication: Prefers language other than English  Cognition Arousal/Alertness: Awake/alert Behavior During Therapy: WFL for tasks assessed/performed Overall Cognitive Status: No family/caregiver present to determine baseline cognitive functioning  General Comments      Exercises     Assessment/Plan    PT Assessment Patient needs continued PT services  PT Problem List Decreased strength;Decreased activity tolerance;Decreased balance;Decreased mobility;Decreased coordination;Decreased safety awareness       PT Treatment  Interventions DME instruction;Gait training;Functional mobility training;Therapeutic activities;Therapeutic exercise;Balance training;Neuromuscular re-education;Patient/family education    PT Goals (Current goals can be found in the Care Plan section)  Acute Rehab PT Goals Patient Stated Goal: none stated PT Goal Formulation: Patient unable to participate in goal setting Time For Goal Achievement: 03/07/18 Potential to Achieve Goals: Fair    Frequency Min 2X/week   Barriers to discharge        Co-evaluation PT/OT/SLP Co-Evaluation/Treatment: (co treated for assist with language barrier)             AM-PAC PT "6 Clicks" Daily Activity  Outcome Measure Difficulty turning over in bed (including adjusting bedclothes, sheets and blankets)?: Unable Difficulty moving from lying on back to sitting on the side of the bed? : Unable Difficulty sitting down on and standing up from a chair with arms (e.g., wheelchair, bedside commode, etc,.)?: Unable Help needed moving to and from a bed to chair (including a wheelchair)?: A Little Help needed walking in hospital room?: A Little Help needed climbing 3-5 steps with a railing? : A Lot 6 Click Score: 11    End of Session   Activity Tolerance: Patient tolerated treatment well Patient left: in chair;with call bell/phone within reach;with chair alarm set Nurse Communication: Mobility status PT Visit Diagnosis: Difficulty in walking, not elsewhere classified (R26.2);Other symptoms and signs involving the nervous system (R29.898)    Time: 7591-6384 PT Time Calculation (min) (ACUTE ONLY): 19 min   Charges:   PT Evaluation $PT Eval Moderate Complexity: 1 Mod          Alben Deeds, PT DPT  Board Certified Neurologic Specialist Acute Rehabilitation Services Pager [phone] Office [phone]   Duncan Dull 02/21/2018, 10:49 AM

## 2018-02-21 NOTE — Evaluation (Signed)
Clinical/Bedside Swallow Evaluation Patient Details  Name: Jerred Zaremba MRN: [id number] Date of Birth: [birth year]  Today's Date: 02/21/2018 Time: SLP Start Time (ACUTE ONLY): 61 SLP Stop Time (ACUTE ONLY): 1016 SLP Time Calculation (min) (ACUTE ONLY): 10 min  Past Medical History:  Past Medical History:  Diagnosis Date  . Alcohol abuse    Patient denies, but was shared with people from [facility] by his male roommate.  . Bilateral cataracts 04/08/2017  . DM (diabetes mellitus), type 2 with renal complications ([place])    uncertain when diagnosed  . HOH (hard of hearing)   . Hypertension   . Legally blind    B/L  . Poor vision    Past Surgical History:  Past Surgical History:  Procedure Laterality Date  . MEMBRANE PEEL Right 09/29/2017   Procedure: MEMBRANE PEEL WITH AIR GAS;  Surgeon: Jalene Mullet, MD;  Location: [place];  Service: Ophthalmology;  Laterality: Right;  . PARS PLANA VITRECTOMY Left 06/29/2017   Procedure: PARS PLANA VITRECTOMY WITH 25 GAUGE AND MEMBRANE PEEL WITH GAS EXCHANGE, AIR SILICONE OIL AND PHACO COAGULATION, LASER;  Surgeon: Jalene Mullet, MD;  Location: [place];  Service: Ophthalmology;  Laterality: Left;  . PARS PLANA VITRECTOMY Right 09/18/2017   Procedure: PARS PLANA VITRECTOMY WITH 25 GAUGE MEMBRANE PILL WITH AIR GAS SILICONE OIL AND PHOTOCOAGULATION;  Surgeon: Jalene Mullet, MD;  Location: [place];  Service: Ophthalmology;  Laterality: Right;  . PARS PLANA VITRECTOMY Right 09/29/2017   Procedure: PARS PLANA VITRECTOMY WITH 25 GAUGE;  Surgeon: Jalene Mullet, MD;  Location: [place];  Service: Ophthalmology;  Laterality: Right;  . PHOTOCOAGULATION Right 09/29/2017   Procedure: PHOTOCOAGULATION;  Surgeon: Jalene Mullet, MD;  Location: [place];  Service: Ophthalmology;  Laterality: Right;  . REPAIR OF COMPLEX TRACTION RETINAL DETACHMENT Right 09/29/2017   Procedure: REPAIR OF COMPLEX TRACTION RETINAL DETACHMENT;  Surgeon: Jalene Mullet, MD;  Location: [place];  Service:  Ophthalmology;  Laterality: Right;   HPI:  Pt is a 77 y.o. male with medical history significant of DM2, HTN, legally blind.  Patient was admitted to hospital last month for multiple embolic strokes with R hemianopsia and recommended Dys 2 diet and thin liquids at that time. Patient was admitted from ALF after staff noticed that he had facial droop (not quite clear which side) and LEFT arm weakness. MRI negative for acute infarcts.   Assessment / Plan / Recommendation Clinical Impression  Pt was seen for swallow evaluation without interpeter present. Attempted to find interpreter, but fot this pt's dialect, there was not one available until tomorrow, and pt is NPO pending swallow evaluation. PO trials were administered with oropharyngeal function appearing consistent with function during recent admission. His mastication is prolonged given poor condition of dentition, but he has no overt signs of aspiration and he ultimately clears his mouth well given small, softened bites of solids. Recommend starting Dys 2 diet and thin liquids. Will f/u briefly for tolerance and speech/language evaluation when interpreter is available. SLP Visit Diagnosis: Dysphagia, unspecified (R13.10)    Aspiration Risk  Mild aspiration risk    Diet Recommendation Dysphagia 2 (Fine chop);Thin liquid   Liquid Administration via: Straw;Cup Medication Administration: Whole meds with liquid Supervision: Staff to assist with self feeding;Intermittent supervision to cue for compensatory strategies Compensations: Slow rate;Small sips/bites Postural Changes: Seated upright at 90 degrees    Other  Recommendations Oral Care Recommendations: Oral care BID   Follow up Recommendations (tba)      Frequency and Duration min 1  x/week  1 week       Prognosis Prognosis for Safe Diet Advancement: (baseline diet)      Swallow Study   General HPI: Pt is a 77 y.o. male with medical history significant of DM2, HTN, legally  blind.  Patient was admitted to hospital last month for multiple embolic strokes with R hemianopsia and recommended Dys 2 diet and thin liquids at that time. Patient was admitted from ALF after staff noticed that he had facial droop (not quite clear which side) and LEFT arm weakness. MRI negative for acute infarcts. Type of Study: Bedside Swallow Evaluation Previous Swallow Assessment: BSE during recent admission recommending Dys 2 (poor dentition), thin liquids Diet Prior to this Study: NPO Temperature Spikes Noted: No Respiratory Status: Room air History of Recent Intubation: No Behavior/Cognition: Alert;Cooperative;Other (Comment)(language barrier) Oral Cavity Assessment: Within Functional Limits Oral Care Completed by SLP: No Oral Cavity - Dentition: Poor condition;Missing dentition Vision: Impaired for self-feeding Self-Feeding Abilities: Needs assist Patient Positioning: Upright in chair Baseline Vocal Quality: Normal    Oral/Motor/Sensory Function Overall Oral Motor/Sensory Function: Other (comment)(R facial droop)   Ice Chips Ice chips: Not tested   Thin Liquid Thin Liquid: Within functional limits Presentation: Straw    Nectar Thick Nectar Thick Liquid: Not tested   Honey Thick Honey Thick Liquid: Not tested   Puree Puree: Within functional limits Presentation: Spoon   Solid     Solid: Impaired Oral Phase Impairments: Impaired mastication      [name] 02/21/2018,10:31 AM  [name], M.A. [place] Acute Environmental education officer [phone] Office [phone]

## 2018-02-22 ENCOUNTER — Telehealth (INDEPENDENT_AMBULATORY_CARE_PROVIDER_SITE_OTHER): Payer: Self-pay | Admitting: Physician Assistant

## 2018-02-22 DIAGNOSIS — G459 Transient cerebral ischemic attack, unspecified: Secondary | ICD-10-CM | POA: Diagnosis not present

## 2018-02-22 LAB — BASIC METABOLIC PANEL
ANION GAP: 6 (ref 5–15)
BUN: 30 mg/dL — ABNORMAL HIGH (ref 8–23)
CALCIUM: 8.5 mg/dL — AB (ref 8.9–10.3)
CO2: 23 mmol/L (ref 22–32)
Chloride: 112 mmol/L — ABNORMAL HIGH (ref 98–111)
Creatinine, Ser: 2.33 mg/dL — ABNORMAL HIGH (ref 0.61–1.24)
GFR calc non Af Amer: 25 mL/min — ABNORMAL LOW (ref >60)
GFR, EST AFRICAN AMERICAN: 29 mL/min — AB (ref >60)
Glucose, Bld: 152 mg/dL — ABNORMAL HIGH (ref 70–99)
POTASSIUM: 4.8 mmol/L (ref 3.5–5.1)
Sodium: 141 mmol/L (ref 135–145)

## 2018-02-22 LAB — GLUCOSE, CAPILLARY
GLUCOSE-CAPILLARY: 105 mg/dL — AB (ref 70–99)
GLUCOSE-CAPILLARY: 111 mg/dL — AB (ref 70–99)
GLUCOSE-CAPILLARY: 213 mg/dL — AB (ref 70–99)
Glucose-Capillary: 174 mg/dL — ABNORMAL HIGH (ref 70–99)

## 2018-02-22 MED ORDER — CEPHALEXIN 250 MG PO CAPS: 250.0000 mg | ORAL_CAPSULE | Freq: Four times a day (QID) | ORAL | 0 refills | Status: DC

## 2018-02-22 NOTE — Progress Notes (Signed)
Patient unable to swallow omega 3 capsules.Timothy [name] large. Informed MD.

## 2018-02-22 NOTE — NC FL2 (Addendum)
[place] MEDICAID FL2 LEVEL OF CARE SCREENING TOOL     IDENTIFICATION  Patient Name: Timothy [name] Birthdate: 10/19/1940 Sex: male Admission Date (Current Location): 02/20/2018  [facility] and [place] Number:  Herbalist and Address:  [facility], [place] [address], [place],  [address]      Provider Number: [id number]  Attending Physician Name and Address:  Lady Deutscher, MD  Relative Name and Phone Number:       Current Level of Care: Hospital Recommended Level of Care: Assisted Living Facility(Assisted living facility) Prior Approval Number:    Date Approved/Denied:   PASRR Number:    Discharge Plan: Other (Comment)(Assisted living facility)    Current Diagnoses: Patient Active Problem List   Diagnosis Date Noted  . TIA (transient ischemic attack) 02/20/2018  . CKD (chronic kidney disease), stage III ([place]) 01/20/2018  . CVA (cerebral vascular accident) ([place]) 01/18/2018  . 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 Hypertension 06/26/2017  . Acute kidney injury superimposed on chronic kidney disease ([place]) 06/25/2017  . 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 Left retinal detachment   . 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 Bilateral cataracts 04/08/2017  . DM (diabetes mellitus), type 2 with renal complications ([place])   . Alcohol abuse   . Poor vision   . Anemia of chronic disease 02/13/2017  . Diffuse brain atrophy ([place]) 02/13/2017  . Epigastric pain 02/11/2017  . Fall at home 11/16/2013  . Closed TBI (traumatic brain injury) ([place]) 11/16/2013  . SDH (subdural hematoma) ([facility]) 11/15/2013    Orientation RESPIRATION BLADDER Height & Weight     (disoriented x4)  Normal External catheter, Incontinent(placed 02/20/18) Weight: 105 lb 9.6 oz (47.9 kg) Height:     BEHAVIORAL SYMPTOMS/MOOD NEUROLOGICAL BOWEL NUTRITION STATUS      Continent (puree)  AMBULATORY STATUS COMMUNICATION OF NEEDS Skin   Limited Assist    Normal                       Personal Care Assistance Level of Assistance  Bathing, Feeding, Dressing Bathing Assistance: Limited assistance Feeding assistance: Independent Dressing Assistance: Limited assistance     Functional Limitations Info  Sight, Hearing, Speech Sight Info: Adequate Hearing Info: Adequate Speech Info: Adequate    SPECIAL CARE FACTORS FREQUENCY                    Contractures Contractures Info: Not present    Additional Factors Info  Code Status, Allergies Code Status Info: Full Code Allergies Info: No known allergies           Current Medications (02/22/2018):  This is the current hospital active medication list Current Facility-Administered Medications  Medication Dose Route Frequency Provider Last Rate Last Dose  . 0.9 %  sodium chloride infusion   Intravenous Continuous Lady Deutscher, MD 125 mL/hr at 02/22/18 1221    . acetaminophen (TYLENOL) tablet 650 mg  650 mg Oral Q6H PRN Etta Quill, DO      . albuterol (PROVENTIL) (2.5 MG/3ML) 0.083% nebulizer solution 3 mL  3 mL Inhalation Q6H PRN Etta Quill, DO      . amLODipine (NORVASC) tablet 10 mg  10 mg Oral Daily Lady Deutscher, MD   10 mg at 02/22/18 1021  . aspirin chewable tablet 81 mg  81 mg Oral Daily Etta Quill, DO   81 mg at 02/22/18 1021  . carvedilol (COREG)  tablet 6.25 mg  6.25 mg Oral BID WC Lady Deutscher, MD   6.25 mg at 02/22/18 7867  . Difluprednate 0.05 % EMUL 1 drop  1 drop Right Eye QID Jennette Kettle M, DO      . dorzolamide-timolol (COSOPT) 22.3-6.8 MG/ML ophthalmic solution 1 drop  1 drop Right Eye BID Jennette Kettle M, DO   1 drop at 02/22/18 1022  . ferrous gluconate (FERGON) tablet 324 mg  324 mg Oral Q breakfast Lady Deutscher, MD   324 mg at 02/22/18 6720  . folic acid (FOLVITE) tablet 1 mg  1 mg Oral Daily Jennette Kettle M, DO   1 mg at 02/22/18 1020  . heparin injection 5,000 Units  5,000 Units Subcutaneous Q8H Jennette Kettle  M, DO   5,000 Units at 02/22/18 1419  . hydrALAZINE (APRESOLINE) tablet 25 mg  25 mg Oral Q8H Lady Deutscher, MD   25 mg at 02/22/18 1419  . insulin aspart (novoLOG) injection 0-9 Units  0-9 Units Subcutaneous TID WC Etta Quill, DO   2 Units at 02/22/18 1200  . isosorbide mononitrate (IMDUR) 24 hr tablet 30 mg  30 mg Oral QHS Lady Deutscher, MD   30 mg at 02/21/18 2101  . ketorolac (ACULAR) 0.5 % ophthalmic solution 1 drop  1 drop Right Eye QID Jennette Kettle M, DO   1 drop at 02/22/18 1420  . omega-3 acid ethyl esters (LOVAZA) capsule 2 g  2 g Oral BID Jennette Kettle M, DO   2 g at 02/22/18 1021  . prednisoLONE acetate (PRED FORTE) 1 % ophthalmic suspension 1 drop  1 drop Right Eye QID Jennette Kettle M, DO   1 drop at 02/22/18 1420  . rosuvastatin (CRESTOR) tablet 5 mg  5 mg Oral QPM Jennette Kettle M, DO   5 mg at 02/21/18 1736  . sodium bicarbonate tablet 650 mg  650 mg Oral BID Etta Quill, DO   650 mg at 02/22/18 1020  . tamsulosin (FLOMAX) capsule 0.4 mg  0.4 mg Oral QPM Lady Deutscher, MD   0.4 mg at 02/21/18 1737  . thiamine (VITAMIN B-1) tablet 100 mg  100 mg Oral Daily Alcario Drought, Jared M, DO   100 mg at 02/22/18 1020     Discharge Medications: TAKE these medications   acetaminophen 325 MG tablet Commonly known as:  TYLENOL Take 650 mg by mouth every 6 (six) hours as needed (general discomfort).   albuterol 108 (90 Base) MCG/ACT inhaler Commonly known as:  PROVENTIL HFA;VENTOLIN HFA Inhale 2 puffs into the lungs every 6 (six) hours as needed for wheezing or shortness of breath.   amLODipine 10 MG tablet Commonly known as:  NORVASC Take 1 tablet (10 mg total) by mouth daily.   aspirin 81 MG chewable tablet Chew 1 tablet (81 mg total) by mouth daily.   carvedilol 6.25 MG tablet Commonly known as:  COREG Take 1 tablet (6.25 mg total) by mouth 2 (two) times daily with a meal.   dorzolamide-timolol 22.3-6.8 MG/ML ophthalmic solution Commonly known as:   COSOPT Place 1 drop into the right eye 2 (two) times daily.   DUREZOL 0.05 % Emul Generic drug:  Difluprednate Place 1 drop into the right eye 4 (four) times daily.   ferrous gluconate 324 MG tablet Commonly known as:  FERGON Take 1 tablet (324 mg total) by mouth daily with breakfast.   FLUTTER Devi 1 Device by Does not apply route as directed.  folic acid 1 MG tablet Commonly known as:  FOLVITE Take 1 tablet (1 mg total) by mouth daily.   hydrALAZINE 25 MG tablet Commonly known as:  APRESOLINE Take 25 mg by mouth every 8 (eight) hours.   isosorbide mononitrate 30 MG 24 hr tablet Commonly known as:  IMDUR Take 1 tablet (30 mg total) by mouth daily. What changed:  when to take this   ketorolac 0.4 % Soln Commonly known as:  ACULAR Place 1 drop into the right eye 4 (four) times daily.   omega-3 acid ethyl esters 1 g capsule Commonly known as:  LOVAZA Take 2 capsules (2 g total) by mouth 2 (two) times daily.   prednisoLONE acetate 1 % ophthalmic suspension Commonly known as:  PRED FORTE Place 1 drop into the right eye 4 (four) times daily.   rosuvastatin 5 MG tablet Commonly known as:  CRESTOR Take 5 mg by mouth every evening.   sodium bicarbonate 650 MG tablet Take 1 tablet (650 mg total) by mouth 2 (two) times daily.   tamsulosin 0.4 MG Caps capsule Commonly known as:  FLOMAX Take 1 capsule (0.4 mg total) by mouth daily. What changed:  when to take this   thiamine 100 MG tablet Commonly known as:  VITAMIN B-1 Take 100 mg by mouth daily.     Relevant Imaging Results:  Relevant Lab Results:   Additional Information SN: [id number]  Geralynn Ochs, LCSW

## 2018-02-22 NOTE — Progress Notes (Signed)
CSW acknowledging discharge back to ALF. CSW contacted ALF earlier, they will need to have DC summary and FL2 sent prior to discharge. CSW attempted to fax over; unable to go through, gave incorrect fax number. CSW called again and got another fax number, still doesn't work. CSW attempted to call back to facility multiple times, and received no answer.   CSW to follow up tomorrow on discharge to ALF.  Laveda Abbe, [place] Clinical Social Worker [phone]

## 2018-02-22 NOTE — Discharge Summary (Addendum)
Physician Discharge Summary  Tom Macpherson UGQ:[id number] DOB: [birth year] DOA: 02/20/2018  PCP: [name], PA-C  Admit date: 02/20/2018 Discharge date: 02/22/2018  Admitted From: Living facility Disposition: Assisted living facility  Recommendations for Outpatient Follow-up:  1. Follow up with PCP in 1-2 weeks 2. Please obtain BMP/CBC in one week 3. Please follow up on the following pending results:  Home Health: No Equipment/Devices: None  Discharge Condition: Stable CODE STATUS: Full code Diet recommendation: Heart Healthy  Brief/Interim Summary:  Timothy [name] a 77 y.o.malewith medical history significant ofDM2, HTN, legally blind. Patient was admitted to hospital last month for multiple embolic strokes with R hemianopsia. Patient was sent in from ALF today after staff noticed that he had facial droop (not quite clear which side) and LEFT arm weakness. At ALF there is one resident that speaks his language: [name]. In the ED, patient in no distress, but further history taking from patient not possible due to language barrier, he speaks [name], and doesn't understand [place]. BGL 312. Creat 2.7 (1.8 at discharge last month). CT head neg. he was admitted for evaluation for stroke work-up.  MRI of the head did not show an acute stroke.  The patient was able to be seen by a translator last evening and is aware that he is in the hospital and he did not have a stroke.  He does have some broken [place] and understood from me that he would be going home today and that there were no changes in his medicines.  Patient has reached maximal benefit of hospitalization.  Discharge diagnosis, prognosis, plans, follow-up, medications and treatments discussed with the patient(or responsible party) and is in agreement with the plans as described.  Patient is stable for discharge.  Discharge Diagnoses:  Principal Problem:   TIA (transient ischemic  attack) Active Problems:   DM (diabetes mellitus), type 2 with renal complications (HCC)   Legally blind   Hypertension   CKD (chronic kidney disease), stage III [facility])    Discharge Instructions  Discharge Instructions    Diet - low sodium heart healthy   Complete by:  As directed    Increase activity slowly   Complete by:  As directed      Allergies as of 02/22/2018   No Known Allergies     Medication List    TAKE these medications   acetaminophen 325 MG tablet Commonly known as:  TYLENOL Take 650 mg by mouth every 6 (six) hours as needed (general discomfort).   albuterol 108 (90 Base) MCG/ACT inhaler Commonly known as:  PROVENTIL HFA;VENTOLIN HFA Inhale 2 puffs into the lungs every 6 (six) hours as needed for wheezing or shortness of breath.   amLODipine 10 MG tablet Commonly known as:  NORVASC Take 1 tablet (10 mg total) by mouth daily.   aspirin 81 MG chewable tablet Chew 1 tablet (81 mg total) by mouth daily.   carvedilol 6.25 MG tablet Commonly known as:  COREG Take 1 tablet (6.25 mg total) by mouth 2 (two) times daily with a meal.   dorzolamide-timolol 22.3-6.8 MG/ML ophthalmic solution Commonly known as:  COSOPT Place 1 drop into the right eye 2 (two) times daily.   DUREZOL 0.05 % Emul Generic drug:  Difluprednate Place 1 drop into the right eye 4 (four) times daily.   ferrous gluconate 324 MG tablet Commonly known as:  FERGON Take 1 tablet (324 mg total) by mouth daily with breakfast.   FLUTTER Devi 1 Device by Does not  apply route as directed.   folic acid 1 MG tablet Commonly known as:  FOLVITE Take 1 tablet (1 mg total) by mouth daily.   hydrALAZINE 25 MG tablet Commonly known as:  APRESOLINE Take 25 mg by mouth every 8 (eight) hours.   isosorbide mononitrate 30 MG 24 hr tablet Commonly known as:  IMDUR Take 1 tablet (30 mg total) by mouth daily. What changed:  when to take this   ketorolac 0.4 % Soln Commonly known as:  ACULAR Place  1 drop into the right eye 4 (four) times daily.   omega-3 acid ethyl esters 1 g capsule Commonly known as:  LOVAZA Take 2 capsules (2 g total) by mouth 2 (two) times daily.   prednisoLONE acetate 1 % ophthalmic suspension Commonly known as:  PRED FORTE Place 1 drop into the right eye 4 (four) times daily.   rosuvastatin 5 MG tablet Commonly known as:  CRESTOR Take 5 mg by mouth every evening.   sodium bicarbonate 650 MG tablet Take 1 tablet (650 mg total) by mouth 2 (two) times daily.   tamsulosin 0.4 MG Caps capsule Commonly known as:  FLOMAX Take 1 capsule (0.4 mg total) by mouth daily. What changed:  when to take this   thiamine 100 MG tablet Commonly known as:  VITAMIN B-1 Take 100 mg by mouth daily.      Contact information for after-discharge care    [facility] .   Service:  Assisted Living Contact information: [address] [place] [phone]             No Known Allergies     Procedures/Studies: Dg Chest 2 View  Result Date: 02/20/2018 CLINICAL DATA:  Altered mental status. EXAM: CHEST - 2 VIEW COMPARISON:  01/18/2018 and older exams. FINDINGS: Cardiac silhouette top-normal in size. No mediastinal or hilar masses. No evidence of adenopathy. Chronic scarring at the left lung base. Chronic bilateral lower lung zone bronchitic changes. Bronchovascular markings are prominent bilaterally. No evidence pneumonia or pulmonary edema. No pleural effusion or pneumothorax. Skeletal structures are demineralized but intact. IMPRESSION: No acute cardiopulmonary disease. Electronically Signed   By: Lajean Manes M.D.   On: 02/20/2018 18:07   Ct Head Wo Contrast  Result Date: 02/20/2018 CLINICAL DATA:  Left sided facial droop with altered mental status. Last seen normal sometime yesterday. EXAM: CT HEAD WITHOUT CONTRAST TECHNIQUE: Contiguous axial images were obtained from the base of the skull through the  vertex without intravenous contrast. COMPARISON:  Head CT and brain MRI, 01/18/2018. FINDINGS: Brain: No evidence of acute infarction, hemorrhage, hydrocephalus, extra-axial collection or mass lesion/mass effect. Old left PICA distribution cerebellar infarct, small old left lateral frontal lobe infarct, small lacunar infarct along the posterior limb of the left internal capsule with additional small thalamic lacunar infarcts bilaterally. There is ventricular and sulcal enlargement reflecting mild to moderate atrophy. Other patchy areas of hypoattenuation in the white matter consistent with mild chronic microvascular ischemic change. Vascular: No hyperdense vessel or unexpected calcification. Skull: Normal. Negative for fracture or focal lesion. Sinuses/Orbits: Prosthetic left globe, stable. No acute globe or orbital abnormality. Mild left maxillary sinus mucosal thickening. Other: None. IMPRESSION: 1. No acute intracranial abnormalities. 2. Multiple old infarcts, mild to moderate atrophy and mild chronic microvascular ischemic change. Electronically Signed   By: Lajean Manes M.D.   On: 02/20/2018 18:06   Mr Brain Wo Contrast  Result Date: 02/21/2018 CLINICAL DATA:  77  y/o M; left-sided facial droop and altered mental status. EXAM: MRI HEAD WITHOUT CONTRAST TECHNIQUE: Multiplanar, multiecho pulse sequences of the brain and surrounding structures were obtained without intravenous contrast. COMPARISON:  02/20/2018 CT head.  01/18/2018 MRI head. FINDINGS: Brain: No acute infarction, hemorrhage, hydrocephalus, extra-axial collection or mass lesion. Stable hemosiderin stained chronic infarctions of the left frontal cortex, right occipital cortex, and left inferior cerebellum. Stable chronic infarct in the right external capsule. Stable moderate chronic microvascular ischemic changes of white matter and volume loss of the brain. Stable small chronic lacunar infarcts within left hemi pons, bilateral thalami, and the  right caudate head. Vascular: Normal flow voids.  Stable hemosiderin stained Skull and upper cervical spine: Normal marrow signal. Sinuses/Orbits: Mild ethmoid and left maxillary sinus mucosal thickening. Normal aeration of the mastoid air cells. Right intra-ocular lens replacement. Stable postoperative appearance of the globes bilaterally. Other: None. IMPRESSION: 1. No acute intracranial abnormality identified. 2. Multiple stable chronic infarctions, chronic microvascular ischemic changes, and volume loss of the brain. Electronically Signed   By: Kristine Garbe M.D.   On: 02/21/2018 03:47      Subjective:  Pleasant male no acute respiratory distress denies pain or complaints Discharge Exam: Vitals:   02/22/18 0719 02/22/18 1226  BP: 139/63 (!) 133/42  Pulse: (!) 55 62  Resp: 16 18  Temp: 98.6 F (37 C) 98.6 F (37 C)  SpO2: 100% 99%   Vitals:   02/22/18 0104 02/22/18 0300 02/22/18 0719 02/22/18 1226  BP: (!) 132/57 (!) 136/48 139/63 (!) 133/42  Pulse: (!) 56 60 (!) 55 62  Resp: 15 18 16 18   Temp: 98.2 F (36.8 C) 98.3 F (36.8 C) 98.6 F (37 C) 98.6 F (37 C)  TempSrc: Oral Oral Axillary Oral  SpO2: 100% 100% 100% 99%  Weight:        General: Pt is alert, awake, not in acute distress Cardiovascular: RRR, S1/S2 +, no rubs, no gallops Respiratory: CTA bilaterally, no wheezing, no rhonchi Abdominal: Soft, NT, ND, bowel sounds + Extremities: no edema, no cyanosis    The results of significant diagnostics from this hospitalization (including imaging, microbiology, ancillary and laboratory) are listed below for reference.     Microbiology: Recent Results (from the past 240 hour(s))  Urine culture     Status: None   Collection Time: 02/20/18  7:17 PM  Result Value Ref Range Status   Specimen Description URINE, RANDOM  Final   Special Requests NONE  Final   Culture   Final    NO GROWTH Performed at [facility], [address], [place], [place]  [address]    Report Status 02/21/2018 FINAL  Final  MRSA PCR Screening     Status: None   Collection Time: 02/21/18  8:15 AM  Result Value Ref Range Status   MRSA by PCR NEGATIVE NEGATIVE Final    Comment:        The GeneXpert MRSA Assay (FDA approved for NASAL specimens only), is one component of a comprehensive MRSA colonization surveillance program. It is not intended to diagnose MRSA infection nor to guide or monitor treatment for MRSA infections. Performed at [facility], [place] [address], [place], [place] [address]      Labs: BNP (last 3 results) No results for input(s): BNP in the last 8760 hours. Basic Metabolic Panel: Recent Labs  Lab 02/20/18 1739 02/21/18 0820 02/22/18 0905  NA 136 140 141  K 5.4* 4.7 4.8  CL 106 111  112*  CO2 24 23 23   GLUCOSE 376* 152* 152*  BUN 43* 34* 30*  CREATININE 2.71* 2.22* 2.33*  CALCIUM 8.4* 8.4* 8.5*   Liver Function Tests: Recent Labs  Lab 02/20/18 1739  AST 32  ALT 24  ALKPHOS 128*  BILITOT 0.3  PROT 6.5  ALBUMIN 3.3*   No results for input(s): LIPASE, AMYLASE in the last 168 hours. Recent Labs  Lab 02/20/18 1740  AMMONIA 19   CBC: Recent Labs  Lab 02/20/18 1739  WBC 10.6*  NEUTROABS 6.7  HGB 9.0*  HCT 29.8*  MCV 79.9  PLT 279   Cardiac Enzymes: No results for input(s): CKTOTAL, CKMB, CKMBINDEX, TROPONINI in the last 168 hours. BNP: Invalid input(s): POCBNP CBG: Recent Labs  Lab 02/21/18 1634 02/21/18 2040 02/22/18 0612 02/22/18 1114 02/22/18 1551  GLUCAP 86 205* 105* 174* 111*   D-Dimer No results for input(s): DDIMER in the last 72 hours. Hgb A1c Recent Labs    02/21/18 0820  HGBA1C 8.1*   Lipid Profile Recent Labs    02/21/18 0820  CHOL 132  HDL 45  LDLCALC 67  TRIG 102  CHOLHDL 2.9   Thyroid function studies No results for input(s): TSH, T4TOTAL, T3FREE, THYROIDAB in the last 72 hours.  Invalid input(s): FREET3 Anemia work up No results for input(s): VITAMINB12,  FOLATE, FERRITIN, TIBC, IRON, RETICCTPCT in the last 72 hours. Urinalysis    Component Value Date/Time   COLORURINE STRAW (A) 02/20/2018 1917   APPEARANCEUR CLEAR 02/20/2018 1917   LABSPEC 1.011 02/20/2018 1917   PHURINE 7.0 02/20/2018 1917   GLUCOSEU >=500 (A) 02/20/2018 [place] NEGATIVE 02/20/2018 [place] NEGATIVE 02/20/2018 [place] NEGATIVE 02/20/2018 1917   PROTEINUR 100 (A) 02/20/2018 1917   UROBILINOGEN 0.2 11/15/2013 0231   NITRITE NEGATIVE 02/20/2018 1917   LEUKOCYTESUR NEGATIVE 02/20/2018 1917   Sepsis Labs Invalid input(s): PROCALCITONIN,  WBC,  LACTICIDVEN Microbiology Recent Results (from the past 240 hour(s))  Urine culture     Status: None   Collection Time: 02/20/18  7:17 PM  Result Value Ref Range Status   Specimen Description URINE, RANDOM  Final   Special Requests NONE  Final   Culture   Final    NO GROWTH Performed at [facility], [place] [address] [place], [place] [address]    Report Status 02/21/2018 FINAL  Final  MRSA PCR Screening     Status: None   Collection Time: 02/21/18  8:15 AM  Result Value Ref Range Status   MRSA by PCR NEGATIVE NEGATIVE Final    Comment:        The GeneXpert MRSA Assay (FDA approved for NASAL specimens only), is one component of a comprehensive MRSA colonization surveillance program. It is not intended to diagnose MRSA infection nor to guide or monitor treatment for MRSA infections. Performed at [facility], [place] [address], [place], [place] [address]      Time coordinating discharge: 42 minutes SIGNED:   Lady Deutscher, MD  FACP [facility] 02/22/2018, 4:07 PM Pager   If 7PM-7AM, please contact night-coverage [email or web address] Password [email or web address]

## 2018-02-22 NOTE — Telephone Encounter (Signed)
Thank you :)

## 2018-02-22 NOTE — Telephone Encounter (Signed)
Katie Physical therapist from [facility] called requesting orders for physical therapy 1 time a week for 1 week and 2 times a week for 3 weeks. Front consulted with CMA and was given the authorization to give verbal orders.  Katie [phone]  Thank you Emmit Pomfret

## 2018-02-22 NOTE — Care Management Obs Status (Signed)
[facility] NOTIFICATION   Patient Details  Name: Timothy [name] MRN: [id number] Date of Birth: 1940/09/30   Medicare Observation Status Notification Given:  Yes    Pollie Friar, RN 02/22/2018, 4:26 PM

## 2018-02-23 DIAGNOSIS — G459 Transient cerebral ischemic attack, unspecified: Secondary | ICD-10-CM | POA: Diagnosis not present

## 2018-02-23 LAB — GLUCOSE, CAPILLARY
GLUCOSE-CAPILLARY: 206 mg/dL — AB (ref 70–99)
Glucose-Capillary: 146 mg/dL — ABNORMAL HIGH (ref 70–99)

## 2018-02-23 NOTE — Progress Notes (Addendum)
Pt d/c to [facility] facility. Nurse attempted to call report to facility and will call back again. Pt is stable, no new concerns. D/c instructions will be given to facility nurse when RN is able to reach a staff at [facility] D/c report given to [facility]

## 2018-02-23 NOTE — Progress Notes (Signed)
Physical Therapy Treatment Patient Details Name: Timothy [name] MRN: [id number] DOB: [birth year] Today's Date: 02/23/2018    History of Present Illness Pt is a 77 y.o. male with medical history significant of DM2, HTN, legally blind.  Patient was admitted to hospital last month for multiple embolic strokes with R hemianopsia.Patient was admitted from ALF after staff noticed that he had facial droop (not quite clear which side) and LEFT arm weakness. MRI negative for acute infarcts.     PT Comments    Pt tolerated treatment well. Pt performed AMB with min guard, requires tactile and verbal cues in mobility for safety and direction due to visual deficit. Current plan of care remains appropriate, anticipate D/C soon.   Follow Up Recommendations  Other (comment)(Return to ALF, continue therapies )     Equipment Recommendations  None recommended by PT    Recommendations for Other Services       Precautions / Restrictions Precautions Precautions: Fall Precaution Comments: legally blind Restrictions Weight Bearing Restrictions: No    Mobility  Bed Mobility Overal bed mobility: Needs Assistance Bed Mobility: Supine to Sit;Rolling Rolling: Min assist Sidelying to sit: Min assist Supine to sit: Min assist     General bed mobility comments: Min assist for initiation of movement requires verbal and tactile cues to direct to EOB   Transfers Overall transfer level: Needs assistance   Transfers: Sit to/from Stand Sit to Stand: Min assist         General transfer comment: min assist for stability to power to upright in standing  Ambulation/Gait Ambulation/Gait assistance: Min assist Gait Distance (Feet): 80 Feet   Gait Pattern/deviations: Step-through pattern;Decreased stride length;Shuffle;Narrow base of support Gait velocity: decreased Gait velocity interpretation: <1.8 ft/sec, indicate of risk for recurrent falls General Gait Details: Min assist for stability and requires  tactlie and verbal cues for direction guidance   Stairs             Wheelchair Mobility    Modified Rankin (Stroke Patients Only) Modified Rankin (Stroke Patients Only) Pre-Morbid Rankin Score: Moderate disability Modified Rankin: Moderately severe disability     Balance Overall balance assessment: Needs assistance Sitting-balance support: Feet supported Sitting balance-Leahy Scale: Good Sitting balance - Comments: able to sit EOB    Standing balance support: No upper extremity supported;Single extremity supported Standing balance-Leahy Scale: Fair Standing balance comment: able to balance in standing without UE support, noted use of UE to support when demomstrating loss of balance                              Cognition Arousal/Alertness: Awake/alert Behavior During Therapy: WFL for tasks assessed/performed Overall Cognitive Status: No family/caregiver present to determine baseline cognitive functioning                                        Exercises      General Comments        Pertinent Vitals/Pain Pain Assessment: Faces Faces Pain Scale: No hurt    Home Living                      Prior Function            PT Goals (current goals can now be found in the care plan section) Acute Rehab PT Goals Patient Stated Goal: none stated PT Goal  Formulation: Patient unable to participate in goal setting Time For Goal Achievement: 03/07/18 Potential to Achieve Goals: Fair Progress towards PT goals: Progressing toward goals    Frequency    Min 2X/week      PT Plan Current plan remains appropriate    Co-evaluation              AM-PAC PT "6 Clicks" Daily Activity  Outcome Measure  Difficulty turning over in bed (including adjusting bedclothes, sheets and blankets)?: A Little Difficulty moving from lying on back to sitting on the side of the bed? : A Little Difficulty sitting down on and standing up from a  chair with arms (e.g., wheelchair, bedside commode, etc,.)?: A Little Help needed moving to and from a bed to chair (including a wheelchair)?: A Little Help needed walking in hospital room?: A Little Help needed climbing 3-5 steps with a railing? : A Lot 6 Click Score: 17    End of Session Equipment Utilized During Treatment: Gait belt Activity Tolerance: Patient tolerated treatment well Patient left: in chair;with call bell/phone within reach;with chair alarm set Nurse Communication: Mobility status PT Visit Diagnosis: Difficulty in walking, not elsewhere classified (R26.2);Other symptoms and signs involving the nervous system (B20.100)     Time: 7121-9758 PT Time Calculation (min) (ACUTE ONLY): 17 min  Charges:  $Gait Training: 8-22 mins                     Alanda Slim, SPT    Timothy [name] 02/23/2018, 10:47 AM

## 2018-02-23 NOTE — Discharge Summary (Signed)
Physician Discharge Summary  Timothy [name] NTZ:[id number] DOB: [birth year] DOA: 02/20/2018  PCP: [name], PA-C  Admit date: 02/20/2018 Discharge date: 02/23/2018  Admitted From: Living facility Disposition: Assisted living facility  Recommendations for Outpatient Follow-up:  1. Follow up with PCP in 1-2 weeks 2. Please obtain BMP/CBC in one week 3. Please follow up on the following pending results:  Home Health: No Equipment/Devices: None  Discharge Condition: Stable CODE STATUS: Full code Diet recommendation: Heart Healthy  Brief/Interim Summary:  Timothy [name] a 77 y.o.malewith medical history significant ofDM2, HTN, legally blind. Patient was admitted to hospital last month for multiple embolic strokes with R hemianopsia. Patient was sent in from ALF today after staff noticed that he had facial droop (not quite clear which side) and LEFT arm weakness. At ALF there is one resident that speaks his language: Timothy [name]. In the ED, patient in no distress, but further history taking from patient not possible due to language barrier, he speaks Timothy [name], and doesn't understand [place]. BGL 312. Creat 2.7 (1.8 at discharge last month). CT head neg. he was admitted for evaluation for stroke work-up.  MRI of the head did not show an acute stroke.  The patient was able to be seen by a translator last evening and is aware that he is in the hospital and he did not have a stroke.  He does have some broken [place] and understood from me that he would be going home today and that there were no changes in his medicines.  Patient has reached maximal benefit of hospitalization.  Discharge diagnosis, prognosis, plans, follow-up, medications and treatments discussed with the patient(or responsible party) and is in agreement with the plans as described.  Patient is stable for discharge.  Discharge Diagnoses:  Principal Problem:   TIA (transient ischemic  attack) Active Problems:   DM (diabetes mellitus), type 2 with renal complications (HCC)   Legally blind   Hypertension   CKD (chronic kidney disease), stage III [facility])    Discharge Instructions  Discharge Instructions    Diet - low sodium heart healthy   Complete by:  As directed    Increase activity slowly   Complete by:  As directed      Allergies as of 02/23/2018   No Known Allergies     Medication List    TAKE these medications   acetaminophen 325 MG tablet Commonly known as:  TYLENOL Take 650 mg by mouth every 6 (six) hours as needed (general discomfort).   albuterol 108 (90 Base) MCG/ACT inhaler Commonly known as:  PROVENTIL HFA;VENTOLIN HFA Inhale 2 puffs into the lungs every 6 (six) hours as needed for wheezing or shortness of breath.   amLODipine 10 MG tablet Commonly known as:  NORVASC Take 1 tablet (10 mg total) by mouth daily.   aspirin 81 MG chewable tablet Chew 1 tablet (81 mg total) by mouth daily.   carvedilol 6.25 MG tablet Commonly known as:  COREG Take 1 tablet (6.25 mg total) by mouth 2 (two) times daily with a meal.   dorzolamide-timolol 22.3-6.8 MG/ML ophthalmic solution Commonly known as:  COSOPT Place 1 drop into the right eye 2 (two) times daily.   DUREZOL 0.05 % Emul Generic drug:  Difluprednate Place 1 drop into the right eye 4 (four) times daily.   ferrous gluconate 324 MG tablet Commonly known as:  FERGON Take 1 tablet (324 mg total) by mouth daily with breakfast.   FLUTTER Devi 1 Device by Does not  apply route as directed.   folic acid 1 MG tablet Commonly known as:  FOLVITE Take 1 tablet (1 mg total) by mouth daily.   hydrALAZINE 25 MG tablet Commonly known as:  APRESOLINE Take 25 mg by mouth every 8 (eight) hours.   isosorbide mononitrate 30 MG 24 hr tablet Commonly known as:  IMDUR Take 1 tablet (30 mg total) by mouth daily. What changed:  when to take this   ketorolac 0.4 % Soln Commonly known as:  ACULAR Place  1 drop into the right eye 4 (four) times daily.   omega-3 acid ethyl esters 1 g capsule Commonly known as:  LOVAZA Take 2 capsules (2 g total) by mouth 2 (two) times daily.   prednisoLONE acetate 1 % ophthalmic suspension Commonly known as:  PRED FORTE Place 1 drop into the right eye 4 (four) times daily.   rosuvastatin 5 MG tablet Commonly known as:  CRESTOR Take 5 mg by mouth every evening.   sodium bicarbonate 650 MG tablet Take 1 tablet (650 mg total) by mouth 2 (two) times daily.   tamsulosin 0.4 MG Caps capsule Commonly known as:  FLOMAX Take 1 capsule (0.4 mg total) by mouth daily. What changed:  when to take this   thiamine 100 MG tablet Commonly known as:  VITAMIN B-1 Take 100 mg by mouth daily.      Contact information for after-discharge care    [facility] .   Service:  Assisted Living Contact information: [address] [place] [phone]             No Known Allergies     Procedures/Studies: Dg Chest 2 View  Result Date: 02/20/2018 CLINICAL DATA:  Altered mental status. EXAM: CHEST - 2 VIEW COMPARISON:  01/18/2018 and older exams. FINDINGS: Cardiac silhouette top-normal in size. No mediastinal or hilar masses. No evidence of adenopathy. Chronic scarring at the left lung base. Chronic bilateral lower lung zone bronchitic changes. Bronchovascular markings are prominent bilaterally. No evidence pneumonia or pulmonary edema. No pleural effusion or pneumothorax. Skeletal structures are demineralized but intact. IMPRESSION: No acute cardiopulmonary disease. Electronically Signed   By: Lajean Manes M.D.   On: 02/20/2018 18:07   Ct Head Wo Contrast  Result Date: 02/20/2018 CLINICAL DATA:  Left sided facial droop with altered mental status. Last seen normal sometime yesterday. EXAM: CT HEAD WITHOUT CONTRAST TECHNIQUE: Contiguous axial images were obtained from the base of the skull through the  vertex without intravenous contrast. COMPARISON:  Head CT and brain MRI, 01/18/2018. FINDINGS: Brain: No evidence of acute infarction, hemorrhage, hydrocephalus, extra-axial collection or mass lesion/mass effect. Old left PICA distribution cerebellar infarct, small old left lateral frontal lobe infarct, small lacunar infarct along the posterior limb of the left internal capsule with additional small thalamic lacunar infarcts bilaterally. There is ventricular and sulcal enlargement reflecting mild to moderate atrophy. Other patchy areas of hypoattenuation in the white matter consistent with mild chronic microvascular ischemic change. Vascular: No hyperdense vessel or unexpected calcification. Skull: Normal. Negative for fracture or focal lesion. Sinuses/Orbits: Prosthetic left globe, stable. No acute globe or orbital abnormality. Mild left maxillary sinus mucosal thickening. Other: None. IMPRESSION: 1. No acute intracranial abnormalities. 2. Multiple old infarcts, mild to moderate atrophy and mild chronic microvascular ischemic change. Electronically Signed   By: Lajean Manes M.D.   On: 02/20/2018 18:06   Mr Brain Wo Contrast  Result Date: 02/21/2018 CLINICAL DATA:  77  y/o M; left-sided facial droop and altered mental status. EXAM: MRI HEAD WITHOUT CONTRAST TECHNIQUE: Multiplanar, multiecho pulse sequences of the brain and surrounding structures were obtained without intravenous contrast. COMPARISON:  02/20/2018 CT head.  01/18/2018 MRI head. FINDINGS: Brain: No acute infarction, hemorrhage, hydrocephalus, extra-axial collection or mass lesion. Stable hemosiderin stained chronic infarctions of the left frontal cortex, right occipital cortex, and left inferior cerebellum. Stable chronic infarct in the right external capsule. Stable moderate chronic microvascular ischemic changes of white matter and volume loss of the brain. Stable small chronic lacunar infarcts within left hemi pons, bilateral thalami, and the  right caudate head. Vascular: Normal flow voids.  Stable hemosiderin stained Skull and upper cervical spine: Normal marrow signal. Sinuses/Orbits: Mild ethmoid and left maxillary sinus mucosal thickening. Normal aeration of the mastoid air cells. Right intra-ocular lens replacement. Stable postoperative appearance of the globes bilaterally. Other: None. IMPRESSION: 1. No acute intracranial abnormality identified. 2. Multiple stable chronic infarctions, chronic microvascular ischemic changes, and volume loss of the brain. Electronically Signed   By: Kristine Garbe M.D.   On: 02/21/2018 03:47      Subjective:  Pleasant male no acute respiratory distress denies pain or complaints Discharge Exam: Vitals:   02/23/18 0733 02/23/18 1140  BP: (!) 144/75 (!) 155/60  Pulse: 69 77  Resp: 16 16  Temp: 98.8 F (37.1 C) 98.8 F (37.1 C)  SpO2: 99% 96%   Vitals:   02/23/18 0014 02/23/18 0400 02/23/18 0733 02/23/18 1140  BP: (!) 139/56 (!) 137/49 (!) 144/75 (!) 155/60  Pulse: 64 (!) 57 69 77  Resp: 18 19 16 16   Temp: 98.2 F (36.8 C) 98.3 F (36.8 C) 98.8 F (37.1 C) 98.8 F (37.1 C)  TempSrc: Axillary Axillary Oral Oral  SpO2: 98% 99% 99% 96%  Weight:        General: Pt is alert, awake, not in acute distress Cardiovascular: RRR, S1/S2 +, no rubs, no gallops Respiratory: CTA bilaterally, no wheezing, no rhonchi Abdominal: Soft, NT, ND, bowel sounds + Extremities: no edema, no cyanosis    The results of significant diagnostics from this hospitalization (including imaging, microbiology, ancillary and laboratory) are listed below for reference.     Microbiology: Recent Results (from the past 240 hour(s))  Urine culture     Status: None   Collection Time: 02/20/18  7:17 PM  Result Value Ref Range Status   Specimen Description URINE, RANDOM  Final   Special Requests NONE  Final   Culture   Final    NO GROWTH Performed at [facility], [address], [place],  [place] [address]    Report Status 02/21/2018 FINAL  Final  MRSA PCR Screening     Status: None   Collection Time: 02/21/18  8:15 AM  Result Value Ref Range Status   MRSA by PCR NEGATIVE NEGATIVE Final    Comment:        The GeneXpert MRSA Assay (FDA approved for NASAL specimens only), is one component of a comprehensive MRSA colonization surveillance program. It is not intended to diagnose MRSA infection nor to guide or monitor treatment for MRSA infections. Performed at [facility], [place] [address], [place], [place] [address]      Labs: BNP (last 3 results) No results for input(s): BNP in the last 8760 hours. Basic Metabolic Panel: Recent Labs  Lab 02/20/18 1739 02/21/18 0820 02/22/18 0905  NA 136 140 141  K 5.4* 4.7 4.8  CL 106 111  112*  CO2 24 23 23   GLUCOSE 376* 152* 152*  BUN 43* 34* 30*  CREATININE 2.71* 2.22* 2.33*  CALCIUM 8.4* 8.4* 8.5*   Liver Function Tests: Recent Labs  Lab 02/20/18 1739  AST 32  ALT 24  ALKPHOS 128*  BILITOT 0.3  PROT 6.5  ALBUMIN 3.3*   No results for input(s): LIPASE, AMYLASE in the last 168 hours. Recent Labs  Lab 02/20/18 1740  AMMONIA 19   CBC: Recent Labs  Lab 02/20/18 1739  WBC 10.6*  NEUTROABS 6.7  HGB 9.0*  HCT 29.8*  MCV 79.9  PLT 279   Cardiac Enzymes: No results for input(s): CKTOTAL, CKMB, CKMBINDEX, TROPONINI in the last 168 hours. BNP: Invalid input(s): POCBNP CBG: Recent Labs  Lab 02/22/18 1114 02/22/18 1551 02/22/18 2213 02/23/18 0606 02/23/18 1104  GLUCAP 174* 111* 213* 146* 206*   D-Dimer No results for input(s): DDIMER in the last 72 hours. Hgb A1c Recent Labs    02/21/18 0820  HGBA1C 8.1*   Lipid Profile Recent Labs    02/21/18 0820  CHOL 132  HDL 45  LDLCALC 67  TRIG 102  CHOLHDL 2.9   Thyroid function studies No results for input(s): TSH, T4TOTAL, T3FREE, THYROIDAB in the last 72 hours.  Invalid input(s): FREET3 Anemia work up No results for input(s): VITAMINB12,  FOLATE, FERRITIN, TIBC, IRON, RETICCTPCT in the last 72 hours. Urinalysis    Component Value Date/Time   COLORURINE STRAW (A) 02/20/2018 1917   APPEARANCEUR CLEAR 02/20/2018 1917   LABSPEC 1.011 02/20/2018 1917   PHURINE 7.0 02/20/2018 1917   GLUCOSEU >=500 (A) 02/20/2018 [place] NEGATIVE 02/20/2018 [place] NEGATIVE 02/20/2018 [place] NEGATIVE 02/20/2018 1917   PROTEINUR 100 (A) 02/20/2018 1917   UROBILINOGEN 0.2 11/15/2013 0231   NITRITE NEGATIVE 02/20/2018 1917   LEUKOCYTESUR NEGATIVE 02/20/2018 1917   Sepsis Labs Invalid input(s): PROCALCITONIN,  WBC,  LACTICIDVEN Microbiology Recent Results (from the past 240 hour(s))  Urine culture     Status: None   Collection Time: 02/20/18  7:17 PM  Result Value Ref Range Status   Specimen Description URINE, RANDOM  Final   Special Requests NONE  Final   Culture   Final    NO GROWTH Performed at [facility], [place] [address], [place], [place] [address]    Report Status 02/21/2018 FINAL  Final  MRSA PCR Screening     Status: None   Collection Time: 02/21/18  8:15 AM  Result Value Ref Range Status   MRSA by PCR NEGATIVE NEGATIVE Final    Comment:        The GeneXpert MRSA Assay (FDA approved for NASAL specimens only), is one component of a comprehensive MRSA colonization surveillance program. It is not intended to diagnose MRSA infection nor to guide or monitor treatment for MRSA infections. Performed at [facility], [place] [address] [place], [place] [address]      Time coordinating discharge: 42 minutes SIGNED:   Lady Deutscher, MD  FACP [facility] 02/23/2018, 11:55 AM Pager   If 7PM-7AM, please contact night-coverage [email or web address] Password [email or web address]

## 2018-02-23 NOTE — Progress Notes (Addendum)
Discharge to: [facility] Anticipated discharge date: 02/23/18 Transportation by: [facility]  Report #: [phone]  [place] signing off.  Laveda Abbe LCSW [phone]

## 2018-02-23 NOTE — Progress Notes (Deleted)
[facility] [address] [place] [address] [phone]       OFFICE FOLLOW UP NOTE  [name] Date of Birth:  Nov 24, 1940 Medical Record Number:  [id number]   Reason for Referral:  hospital stroke follow up  CHIEF COMPLAINT:  No chief complaint on file.   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 History obtained from *** and chart review. Reviewed all radiology images and labs personally.  Timothy [name] is a 76 y.o. male with history of ETOH use, cataracts., DB, HTN, HLD, HOH, blind, dementia who presented with altered mental status and worsening vision complaints.  CT had reviewed and showed left cerebellar infarct along with left frontal lobe infarct with indeterminate age.  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  MRA negative for acute findings.  Carotid Doppler showed ***.  2D echo showed an EF of ***.  It was felt as though recent infarcts were likely embolic but due to baseline of dementia and blindness, he was seen to not be at a good anticoagulation candidate therefore no further testing was recommended to assess for embolic source.  LDL 58 and recommended continuation of Lipitor.  A1c 7.1 and recommended continued follow-up and management by PCP.  HTN elevated during admission but stable and recommended long-term BP goal normotensive range.  Recommended DAPT for 3 weeks and aspirin alone.  Per notes, patient was agitated during admission that required physical and chemical restraints most likely related to hospital delirium.  Recommended to return to prior SNF once stabilized and was discharged in stable condition.  Patient return to ED on 02/20/2018 from ALF due to staff observing facial droop and left arm weakness.  Imaging did not show show acute stroke.  Diagnosed with  possible TIA as symptoms resolved and was discharged back to facility.   ROS:   14 system review of systems performed and negative with exception of ***  PMH:  Past Medical History:  Diagnosis Date  . Alcohol abuse    Patient denies, but was shared with people from [facility] by his male roommate.  . Bilateral cataracts 04/08/2017  . DM (diabetes mellitus), type 2 with renal complications ([place])    uncertain when diagnosed  . HOH (hard of hearing)   . Hypertension   . Legally blind    B/L  . Poor vision     PSH:  Past Surgical History:  Procedure Laterality Date  . MEMBRANE PEEL Right 09/29/2017   Procedure: MEMBRANE PEEL WITH AIR GAS;  Surgeon: Jalene Mullet, MD;  Location: [place];  Service: Ophthalmology;  Laterality: Right;  . PARS PLANA VITRECTOMY Left 06/29/2017   Procedure: PARS PLANA VITRECTOMY WITH 25 GAUGE AND MEMBRANE PEEL WITH GAS EXCHANGE, AIR SILICONE OIL AND PHACO COAGULATION, LASER;  Surgeon: Jalene Mullet, MD;  Location: [place];  Service: Ophthalmology;  Laterality: Left;  . PARS PLANA VITRECTOMY Right 09/18/2017   Procedure: PARS PLANA VITRECTOMY WITH 25 GAUGE MEMBRANE PILL WITH AIR GAS SILICONE OIL AND PHOTOCOAGULATION;  Surgeon: Jalene Mullet, MD;  Location: [place];  Service: Ophthalmology;  Laterality: Right;  . PARS PLANA VITRECTOMY Right 09/29/2017   Procedure: PARS PLANA VITRECTOMY WITH 25 GAUGE;  Surgeon: Jalene Mullet, MD;  Location: [place];  Service: Ophthalmology;  Laterality: Right;  . PHOTOCOAGULATION Right 09/29/2017   Procedure: PHOTOCOAGULATION;  Surgeon:  Jalene Mullet, MD;  Location: [place];  Service: Ophthalmology;  Laterality: Right;  . 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 Marital status: Married    Spouse  name: Not on file  . Number of children: 4  . Years of education: 0  . Highest education level: Not on file  Occupational History  . Occupation: unemployed  Social Needs  . Financial resource strain: Not on file  . Food insecurity:    Worry: Not on file    Inability: Not on file  . Transportation needs:    Medical: Not on file    Non-medical: Not on file  Tobacco Use  . Smoking status: Former Smoker    Types: Cigarettes    Last attempt to quit: 05/19/1990    Years since quitting: 27.7  . Smokeless tobacco: Never Used  Substance and Sexual Activity  . 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 Drug use: No  . Sexual activity: Not on file  Lifestyle  . Physical activity:    Days per week: Not on file    Minutes per session: Not on file  . Stress: Not on file  Relationships  . Social connections:    Talks on phone: Not on file    Gets together: Not on file    Attends religious service: Not on file    Active member of club or organization: Not on file    Attends meetings of clubs or organizations: Not on file    Relationship status: Not on file  . Intimate partner violence:    Fear of current or ex partner: Not on file    Emotionally abused: Not on file    Physically abused: Not on file    Forced sexual activity: Not on file  Other Topics Concern  . Not on file  Social History Narrative   Originally from [place]   [place]   Was in camp in [place] for 3-4 months.   [place]ame [place] In [place].  Initially in [place].   He came here with his nephew and lived together until the house where he was living was sold, then went separate ways.  Not clear how long ago this was.   Hmuin Rcom, with whom he was recently lived with at [place], stated he moved from place to place until kicked out until he lived with her for past year. (2018)   Used to work in Consulting civil engineer.      Looking at possibility of  [employer] or [employer].  [place] male already living there.      Never attended school.  Does not read or write in his language of [place].      Left [place] as fleeing communists.  Denies any physical injury/toruture or imprisonment.   He did fight in the war in [place].     Left his wife behind in their home.                  Family History:  Family History  Problem Relation Age of Onset  . Lung disease Neg Hx     Medications:   Current Outpatient Medications on File Prior to Visit  Medication Sig Dispense Refill  . acetaminophen (TYLENOL) 325 MG tablet Take 650 mg by mouth every 6 (six) hours as needed (general discomfort).    Marland Kitchen albuterol (PROVENTIL HFA;VENTOLIN HFA) 108 (90 Base) MCG/ACT inhaler Inhale 2 puffs into the lungs every 6 (six) hours as needed for wheezing or shortness of breath. 1 Inhaler 2  . amLODipine (NORVASC) 10 MG tablet Take 1 tablet (10 mg total) by mouth daily. 30 tablet 5  . aspirin 81 MG chewable tablet Chew 1 tablet (81 mg total) by mouth daily. 30 tablet 11  . carvedilol (COREG) 6.25 MG tablet Take 1 tablet (6.25 mg total) by mouth 2 (two) times daily with a meal. 60 tablet 5  . Difluprednate (DUREZOL) 0.05 % EMUL Place 1 drop into the right eye 4 (four) times daily.    . dorzolamide-timolol (COSOPT) 22.3-6.8 MG/ML ophthalmic solution Place 1 drop into the right eye 2 (two) times daily.    . ferrous gluconate (FERGON) 324 MG tablet Take 1 tablet (324 mg total) by mouth daily with breakfast. 30 tablet 5  . folic acid (FOLVITE) 1 MG tablet Take 1 tablet (1 mg total) by mouth daily. 30 tablet 5  . hydrALAZINE (APRESOLINE) 25 MG tablet Take 25 mg by mouth every 8 (eight) hours.    . isosorbide mononitrate (IMDUR) 30 MG 24 hr tablet Take 1 tablet (30 mg total) by mouth daily. (Patient taking differently: Take 30 mg by mouth at bedtime. ) 30 tablet 5  . ketorolac (ACULAR) 0.4 % SOLN Place 1 drop into the right eye 4 (four) times daily.    Marland Kitchen omega-3 acid  ethyl esters (LOVAZA) 1 g capsule Take 2 capsules (2 g total) by mouth 2 (two) times daily. 90 capsule 1  . prednisoLONE acetate (PRED FORTE) 1 % ophthalmic suspension Place 1 drop into the right eye 4 (four) times daily.    Marland Kitchen Respiratory Therapy Supplies (FLUTTER) DEVI 1 Device by Does not apply route as directed. 1 each 0  . rosuvastatin (CRESTOR) 5 MG tablet Take 5 mg by mouth every evening.    . sodium bicarbonate 650 MG tablet Take 1 tablet (650 mg total) by mouth 2 (two) times daily. 30 tablet 0  . tamsulosin (FLOMAX) 0.4 MG CAPS capsule Take 1 capsule (0.4 mg total) by mouth daily. (Patient taking differently: Take 0.4 mg by mouth every evening. ) 30 capsule 0  . thiamine (VITAMIN B-1) 100 MG tablet Take 100 mg by mouth daily.     Current Facility-Administered Medications on File Prior to Visit  Medication Dose Route Frequency Provider Last Rate Last Dose  . acetaminophen (TYLENOL) tablet 650 mg  650 mg Oral Q6H PRN Etta Quill, DO   650 mg at 02/22/18 2146  . albuterol (PROVENTIL) (2.5 MG/3ML) 0.083% nebulizer solution 3 mL  3 mL Inhalation Q6H PRN Etta Quill, DO      . amLODipine (NORVASC) tablet 10 mg  10 mg Oral Daily Lady Deutscher, MD   10 mg at 02/23/18 1010  . aspirin chewable tablet 81 mg  81 mg Oral Daily Etta Quill, DO   81 mg at 02/23/18 1010  . carvedilol (COREG) tablet 6.25 mg  6.25 mg Oral BID WC Lady Deutscher, MD   6.25 mg at 02/23/18 0800  . dorzolamide-timolol (COSOPT) 22.3-6.8 MG/ML ophthalmic solution 1 drop  1 drop Right Eye BID Jennette Kettle M, DO   1 drop at 02/23/18 1012  . ferrous gluconate (FERGON) tablet  324 mg  324 mg Oral Q breakfast Lady Deutscher, MD   324 mg at 02/23/18 0800  . folic acid (FOLVITE) tablet 1 mg  1 mg Oral Daily Jennette Kettle M, DO   1 mg at 02/23/18 1012  . heparin injection 5,000 Units  5,000 Units Subcutaneous Q8H Jennette Kettle M, DO   5,000 Units at 02/23/18 0541  . hydrALAZINE (APRESOLINE) tablet 25 mg  25  mg Oral Q8H Lady Deutscher, MD   25 mg at 02/23/18 1417  . insulin aspart (novoLOG) injection 0-9 Units  0-9 Units Subcutaneous TID WC Etta Quill, DO   3 Units at 02/23/18 1231  . isosorbide mononitrate (IMDUR) 24 hr tablet 30 mg  30 mg Oral QHS Lady Deutscher, MD   30 mg at 02/22/18 2146  . ketorolac (ACULAR) 0.5 % ophthalmic solution 1 drop  1 drop Right Eye QID Jennette Kettle M, DO   1 drop at 02/23/18 1412  . omega-3 acid ethyl esters (LOVAZA) capsule 2 g  2 g Oral BID Jennette Kettle M, DO   2 g at 02/23/18 1012  . prednisoLONE acetate (PRED FORTE) 1 % ophthalmic suspension 1 drop  1 drop Right Eye QID Jennette Kettle M, DO   1 drop at 02/23/18 1400  . rosuvastatin (CRESTOR) tablet 5 mg  5 mg Oral QPM Jennette Kettle M, DO   5 mg at 02/22/18 1723  . sodium bicarbonate tablet 650 mg  650 mg Oral BID Jennette Kettle M, DO   650 mg at 02/23/18 1011  . tamsulosin (FLOMAX) capsule 0.4 mg  0.4 mg Oral QPM Lady Deutscher, MD   0.4 mg at 02/22/18 1723  . thiamine (VITAMIN B-1) tablet 100 mg  100 mg Oral Daily Alcario Drought, Jared M, DO   100 mg at 02/23/18 1010    Allergies:  No Known Allergies   Physical Exam  There were no vitals filed for this visit. There is no height or weight on file to calculate BMI. No exam data present  General: well developed, well nourished, seated, in no evident distress Head: head normocephalic and atraumatic.   Neck: supple with no carotid or supraclavicular bruits Cardiovascular: regular rate and rhythm, no murmurs Musculoskeletal: no deformity Skin:  no rash/petichiae Vascular:  Normal pulses all extremities  Neurologic Exam Mental Status: Awake and fully alert. Oriented to place and time. Recent and remote memory intact. Attention span, concentration and fund of knowledge appropriate. Mood and affect appropriate.  Cranial Nerves: Fundoscopic exam reveals sharp disc margins. Pupils equal, briskly reactive to light. Extraocular movements full  without nystagmus. Visual fields full to confrontation. Hearing intact. Facial sensation intact. Face, tongue, palate moves normally and symmetrically.  Motor: Normal bulk and tone. Normal strength in all tested extremity muscles. Sensory.: intact to touch , pinprick , position and vibratory sensation.  Coordination: Rapid alternating movements normal in all extremities. Finger-to-nose and heel-to-shin performed accurately bilaterally. Gait and Station: Arises from chair without difficulty. Stance is normal. Gait demonstrates normal stride length and balance . Able to heel, toe and tandem walk without difficulty.  Reflexes: 1+ and symmetric. Toes downgoing.    NIHSS  *** Modified Rankin  ***    Diagnostic Data (Labs, Imaging, Testing)  CT HEAD WO CONTRAST 01/18/2018 IMPRESSION: Large area of low attenuation within the left cerebellar hemisphere most compatible with subacute infarct. Small area of low attenuation within the left frontal lobe most compatible with age-indeterminate infarct.  MR BRAIN WO CONTRAST  MR MRA HEAD  01/18/2018 IMPRESSION: 1. Patchy acute infarct along the cerebral convexities and left corona radiata. These are seen in the anterior and posterior circulation and suggest central embolic disease. 2. Remote left PICA infarct. Multiple remote lacunar infarcts. 3. Remote external capsule hemorrhage on the right. 4. Motion degraded intracranial MRA without acute finding.  ECHOCARDIOGRAM 01/20/2018 Study Conclusions - Left ventricle: The cavity size was normal. Systolic function was   normal. The estimated ejection fraction was in the range of 55%   to 60%. Wall motion was normal; there were no regional wall   motion abnormalities. Doppler parameters are consistent with   abnormal left ventricular relaxation (grade 1 diastolic   dysfunction). - Aortic valve: There was mild regurgitation. - Mitral valve: There was trivial regurgitation. - Left atrium: The atrium  was moderately dilated. - Atrial septum: No obvious defect or patent foramen ovale was   identified by color flow Doppler. There was an atrial septal   aneurysm. - Tricuspid valve: There was trivial regurgitation. - Pericardium, extracardiac: A trivial pericardial effusion was   identified posterior to the heart.   ASSESSMENT: Timothy [name] is a 77 y.o. 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 Vascular risk factors include EtOH use, DM, HTN, HLD and dementia.     PLAN: -Continue {anticoagulants:31417}  and ***  for secondary stroke prevention -F/u with PCP regarding your *** management -continue to monitor BP at home -advised to continue to stay active and maintain a healthy diet -Maintain strict control of hypertension with blood pressure goal below 130/90, diabetes with hemoglobin A1c goal below 6.5% and cholesterol with LDL cholesterol (bad cholesterol) goal below 70 mg/dL. I also advised the patient to eat a healthy diet with plenty of whole grains, cereals, fruits and vegetables, exercise regularly and maintain ideal body weight.  Follow up in *** or call earlier if needed   Greater than 50% of time during this 25 minute visit was spent on counseling,explanation of diagnosis of ***, reviewing risk factor management of ***, planning of further management, discussion with patient and family and coordination of care    Venancio Poisson, [facility]  [facility] [address] [place], [place] [address]  Phone [phone] Fax [phone] Note: This document was prepared with digital dictation and possible smart phrase technology. Any transcriptional errors that result from this process are unintentional.

## 2018-02-23 NOTE — Discharge Summary (Signed)
See discharge summary dated 02/22/2018.  Patient stayed overnight due to inability of facility to accept him.  He is ready for discharge back to facility.  There are no changes in patient condition, diagnoses, medications, or discharge plan.

## 2018-02-24 ENCOUNTER — Telehealth: Payer: Self-pay

## 2018-02-24 ENCOUNTER — Ambulatory Visit: Payer: Self-pay | Admitting: Adult Health

## 2018-02-24 NOTE — Telephone Encounter (Signed)
The facility was unable to bring pt today because of transportation issues.

## 2018-02-25 ENCOUNTER — Encounter: Payer: Self-pay | Admitting: Adult Health

## 2018-02-26 ENCOUNTER — Ambulatory Visit (INDEPENDENT_AMBULATORY_CARE_PROVIDER_SITE_OTHER): Payer: Medicare Other | Admitting: Physician Assistant

## 2018-02-26 ENCOUNTER — Other Ambulatory Visit: Payer: Self-pay

## 2018-02-26 ENCOUNTER — Encounter (INDEPENDENT_AMBULATORY_CARE_PROVIDER_SITE_OTHER): Payer: Self-pay | Admitting: Physician Assistant

## 2018-02-26 VITALS — BP 147/65 | HR 62 | Temp 97.9°F | Ht 62.0 in | Wt 100.0 lb

## 2018-02-26 DIAGNOSIS — Z09 Encounter for follow-up examination after completed treatment for conditions other than malignant neoplasm: Secondary | ICD-10-CM | POA: Diagnosis not present

## 2018-02-26 DIAGNOSIS — I10 Essential (primary) hypertension: Secondary | ICD-10-CM

## 2018-02-26 MED ORDER — ISOSORBIDE MONONITRATE ER 30 MG PO TB24: 30.0000 mg | ORAL_TABLET | Freq: Every day | ORAL | 1 refills | Status: DC

## 2018-02-26 MED ORDER — TAMSULOSIN HCL 0.4 MG PO CAPS: 0.4000 mg | ORAL_CAPSULE | Freq: Every day | ORAL | 1 refills | Status: DC

## 2018-02-26 MED ORDER — AMLODIPINE BESYLATE 10 MG PO TABS: 10.0000 mg | ORAL_TABLET | Freq: Every day | ORAL | 1 refills | Status: DC

## 2018-02-26 MED ORDER — ROSUVASTATIN CALCIUM 5 MG PO TABS: 5.0000 mg | ORAL_TABLET | Freq: Every evening | ORAL | 1 refills | Status: DC

## 2018-02-26 MED ORDER — ASPIRIN 81 MG PO CHEW: 81.0000 mg | CHEWABLE_TABLET | Freq: Every day | ORAL | 1 refills | Status: DC

## 2018-02-26 MED ORDER — HYDRALAZINE HCL 25 MG PO TABS: 25.0000 mg | ORAL_TABLET | Freq: Three times a day (TID) | ORAL | 1 refills | Status: DC

## 2018-02-26 MED ORDER — CARVEDILOL 6.25 MG PO TABS: 6.2500 mg | ORAL_TABLET | Freq: Two times a day (BID) | ORAL | 1 refills | Status: DC

## 2018-02-26 NOTE — Progress Notes (Signed)
Subjective:  Patient ID: Timothy [name], male    DOB: July 22, 1940  Age: 77 y.o. MRN: [id number]  CC: hospital f/u mini stroke  HPI  Timothy [name] a 77 y.o.malewith a medical history of HTN, Hypertriglyceridemia, DM2, CKD3, anemia, blindness, atherosclerosis, and partial hearing loss presents on hospital discharge f/u. Has been to the hospital four different time since his last visit here in July with common diagnosis of hyperkalemia and AKI. In addition, he was diagnosed with CVA on 01/18/18. CVA was felt to be embolic. "Per neurology not a good anticoagulation candidate secondary to baseline dementia and blindness. Therefore further work-up to look for embolic source was deferred". Pt went to hospital on 02/20/18 because of left sided weakness and facial drooping. It was determined he did not have another stroke. He was discharged on 02/23/18 as he had reached maximal benefit at the hospital. Pt states he feels well. Endorses blindness and has moderately unsteady gait. Does not endorse CP, palpitations, SOB, HA, weakness, tingling, numbness, abdominal pain, f/c/n/v, rash, swelling, or GI/GU sxs. Has an upcoming appointment with [facility] on 03/05/18.      Outpatient Medications Prior to Visit  Medication Sig Dispense Refill  . acetaminophen (TYLENOL) 325 MG tablet Take 650 mg by mouth every 6 (six) hours as needed (general discomfort).    Marland Kitchen albuterol (PROVENTIL HFA;VENTOLIN HFA) 108 (90 Base) MCG/ACT inhaler Inhale 2 puffs into the lungs every 6 (six) hours as needed for wheezing or shortness of breath. 1 Inhaler 2  . amLODipine (NORVASC) 10 MG tablet Take 1 tablet (10 mg total) by mouth daily. 30 tablet 5  . aspirin 81 MG chewable tablet Chew 1 tablet (81 mg total) by mouth daily. 30 tablet 11  . carvedilol (COREG) 6.25 MG tablet Take 1 tablet (6.25 mg total) by mouth 2 (two) times daily with a meal. 60 tablet 5  . Difluprednate (DUREZOL) 0.05 % EMUL Place 1 drop into the right eye 4 (four) times daily.     . dorzolamide-timolol (COSOPT) 22.3-6.8 MG/ML ophthalmic solution Place 1 drop into the right eye 2 (two) times daily.    . ferrous gluconate (FERGON) 324 MG tablet Take 1 tablet (324 mg total) by mouth daily with breakfast. 30 tablet 5  . folic acid (FOLVITE) 1 MG tablet Take 1 tablet (1 mg total) by mouth daily. 30 tablet 5  . hydrALAZINE (APRESOLINE) 25 MG tablet Take 25 mg by mouth every 8 (eight) hours.    . isosorbide mononitrate (IMDUR) 30 MG 24 hr tablet Take 1 tablet (30 mg total) by mouth daily. (Patient taking differently: Take 30 mg by mouth at bedtime. ) 30 tablet 5  . ketorolac (ACULAR) 0.4 % SOLN Place 1 drop into the right eye 4 (four) times daily.    Marland Kitchen omega-3 acid ethyl esters (LOVAZA) 1 g capsule Take 2 capsules (2 g total) by mouth 2 (two) times daily. 90 capsule 1  . prednisoLONE acetate (PRED FORTE) 1 % ophthalmic suspension Place 1 drop into the right eye 4 (four) times daily.    Marland Kitchen Respiratory Therapy Supplies (FLUTTER) DEVI 1 Device by Does not apply route as directed. 1 each 0  . rosuvastatin (CRESTOR) 5 MG tablet Take 5 mg by mouth every evening.    . sodium bicarbonate 650 MG tablet Take 1 tablet (650 mg total) by mouth 2 (two) times daily. 30 tablet 0  . tamsulosin (FLOMAX) 0.4 MG CAPS capsule Take 1 capsule (0.4 mg total) by mouth daily. (Patient taking differently:  Take 0.4 mg by mouth every evening. ) 30 capsule 0  . thiamine (VITAMIN B-1) 100 MG tablet Take 100 mg by mouth daily.     No facility-administered medications prior to visit.      ROS Review of Systems  Constitutional: Negative for chills, fever and malaise/fatigue.  Eyes: Negative for blurred vision.       Blindness  Respiratory: Negative for shortness of breath.   Cardiovascular: Negative for chest pain and palpitations.  Gastrointestinal: Negative for abdominal pain and nausea.  Genitourinary: Negative for dysuria and hematuria.  Musculoskeletal: Negative for joint pain and myalgias.  Skin:  Negative for rash.  Neurological: Negative for tingling and headaches.  Psychiatric/Behavioral: Negative for depression. The patient is not nervous/anxious.     Objective:  BP (!) 147/65 (BP Location: Right Arm, Patient Position: Sitting, Cuff Size: Small)   Pulse 62   Temp 97.9 F (36.6 C) (Oral)   Ht 5\' 2"  (1.575 m)   Wt 100 lb (45.4 kg)   SpO2 99%   BMI 18.29 kg/m   BP/Weight 02/26/2018 02/23/2018 06/23/4268  Systolic BP 623 762 -  Diastolic BP 65 60 -  Wt. (Lbs) 100 - 105.6  BMI 18.29 - 19.31      Physical Exam  Constitutional: He is oriented to person, place, and time.  frail, NAD, reserved, using cane for ambulation  HENT:  Head: Normocephalic and atraumatic.  Eyes: No scleral icterus.  Neck: Normal range of motion. Neck supple. No thyromegaly present.  Cardiovascular: Normal rate, regular rhythm and normal heart sounds. Exam reveals no gallop and no friction rub.  No murmur heard. Pulmonary/Chest: Effort normal and breath sounds normal.  Poor air movement throughout.  Musculoskeletal: He exhibits no edema.  Neurological: He is alert and oriented to person, place, and time.  Mild to moderately unsteady gait  Skin: Skin is warm and dry. No rash noted. No erythema. No pallor.  Psychiatric: He has a normal mood and affect. His behavior is normal. Thought content normal.  Vitals reviewed.    Assessment & Plan:   1. Essential hypertension - Refill aspirin 81 MG chewable tablet; Chew 1 tablet (81 mg total) by mouth daily.  Dispense: 90 tablet; Refill: 1 - Refill carvedilol (COREG) 6.25 MG tablet; Take 1 tablet (6.25 mg total) by mouth 2 (two) times daily with a meal.  Dispense: 180 tablet; Refill: 1 - Refill amLODipine (NORVASC) 10 MG tablet; Take 1 tablet (10 mg total) by mouth daily.  Dispense: 90 tablet; Refill: 1 - Basic Metabolic Panel - CBC with Differential - Ambulatory referral to Nephrology   2. Hospital discharge follow-up - Notes reviewed.  - Pt to  f/u with [facility] on 03/05/18  Meds ordered this encounter  Medications  . rosuvastatin (CRESTOR) 5 MG tablet    Sig: Take 1 tablet (5 mg total) by mouth every evening.    Dispense:  90 tablet    Refill:  1    Order Specific Question:   Supervising Provider    Answer:   Charlott Rakes [[id number]]  . aspirin 81 MG chewable tablet    Sig: Chew 1 tablet (81 mg total) by mouth daily.    Dispense:  90 tablet    Refill:  1    Order Specific Question:   Supervising Provider    Answer:   Charlott Rakes [[id number]]  . DISCONTD: tamsulosin (FLOMAX) 0.4 MG CAPS capsule    Sig: Take 1 capsule (0.4 mg total) by mouth daily.  Dispense:  90 capsule    Refill:  1    Order Specific Question:   Supervising Provider    Answer:   Charlott Rakes [[id number]]  . isosorbide mononitrate (IMDUR) 30 MG 24 hr tablet    Sig: Take 1 tablet (30 mg total) by mouth at bedtime.    Dispense:  90 tablet    Refill:  1    Order Specific Question:   Supervising Provider    Answer:   Charlott Rakes [[id number]]  . hydrALAZINE (APRESOLINE) 25 MG tablet    Sig: Take 1 tablet (25 mg total) by mouth every 8 (eight) hours.    Dispense:  270 tablet    Refill:  1    Order Specific Question:   Supervising Provider    Answer:   Charlott Rakes [[id number]]  . carvedilol (COREG) 6.25 MG tablet    Sig: Take 1 tablet (6.25 mg total) by mouth 2 (two) times daily with a meal.    Dispense:  180 tablet    Refill:  1    Order Specific Question:   Supervising Provider    Answer:   Charlott Rakes [[id number]]  . amLODipine (NORVASC) 10 MG tablet    Sig: Take 1 tablet (10 mg total) by mouth daily.    Dispense:  90 tablet    Refill:  1    Order Specific Question:   Supervising Provider    Answer:   Charlott Rakes [[id number]]  . tamsulosin (FLOMAX) 0.4 MG CAPS capsule    Sig: Take 1 capsule (0.4 mg total) by mouth daily.    Dispense:  90 capsule    Refill:  1    Order Specific Question:   Supervising Provider    Answer:   Charlott Rakes [[id number]]    Follow-up:  Return in about 4 weeks (around 03/26/2018) for HTN, CKD.   [name] PA

## 2018-02-26 NOTE — Patient Instructions (Signed)
Chronic Kidney Disease, Adult Chronic kidney disease (CKD) happens when the kidneys are damaged during a time of 3 or more months. The kidneys are two organs that do many important jobs in the body. These jobs include:  Removing wastes and extra fluids from the blood.  Making hormones that maintain the amount of fluid in your tissues and blood vessels.  Making sure that the body has the right amount of fluids and chemicals.  Most of the time, this condition does not go away, but it can usually be controlled. Steps must be taken to slow down the kidney damage or stop it from getting worse. Otherwise, the kidneys may stop working. Follow these instructions at home:  Follow your diet as told by your doctor. You may need to avoid alcohol, salty foods (sodium), and foods that are high in potassium, calcium, and protein.  Take over-the-counter and prescription medicines only as told by your doctor. Do not take any new medicines unless your doctor says you can do that. These include vitamins and minerals. ? Medicines and nutritional supplements can make kidney damage worse. ? Your doctor may need to change how much medicine you take.  Do not use any tobacco products. These include cigarettes, chewing tobacco, and e-cigarettes. If you need help quitting, ask your doctor.  Keep all follow-up visits as told by your doctor. This is important.  Check your blood pressure. Tell your doctor if there are changes to your blood pressure.  Get to a healthy weight. Stay at that weight. If you need help with this, ask your doctor.  Start or continue an exercise plan. Try to exercise at least 30 minutes a day, 5 days a week.  Stay up-to-date with your shots (immunizations) as told by your doctor. Contact a doctor if:  Your symptoms get worse.  You have new symptoms. Get help right away if:  You have symptoms of end-stage kidney disease. These include: ? Headaches. ? Skin that is darker or lighter  than normal. ? Numbness in your hands or feet. ? Easy bruising. ? Having hiccups often. ? Chest pain. ? Shortness of breath. ? Stopping of menstrual periods in women.  You have a fever.  You are making very little pee (urine).  You have pain or bleeding when you pee (urinate). This information is not intended to replace advice given to you by your health care provider. Make sure you discuss any questions you have with your health care provider. Document Released: 07/30/2009 Document Revised: 10/11/2015 Document Reviewed: 01/02/2012 Elsevier Interactive Patient Education  2017 Elsevier Inc.  

## 2018-02-27 LAB — CBC WITH DIFFERENTIAL/PLATELET
BASOS ABS: 0 10*3/uL (ref 0.0–0.2)
Basos: 0 %
EOS (ABSOLUTE): 0.6 10*3/uL — ABNORMAL HIGH (ref 0.0–0.4)
EOS: 5 %
HEMOGLOBIN: 9.5 g/dL — AB (ref 13.0–17.7)
Hematocrit: 29.6 % — ABNORMAL LOW (ref 37.5–51.0)
Immature Grans (Abs): 0 10*3/uL (ref 0.0–0.1)
Immature Granulocytes: 0 %
Lymphocytes Absolute: 1.6 10*3/uL (ref 0.7–3.1)
Lymphs: 14 %
MCH: 24.6 pg — ABNORMAL LOW (ref 26.6–33.0)
MCHC: 32.1 g/dL (ref 31.5–35.7)
MCV: 77 fL — ABNORMAL LOW (ref 79–97)
MONOCYTES: 5 %
MONOS ABS: 0.5 10*3/uL (ref 0.1–0.9)
NEUTROS ABS: 8.6 10*3/uL — AB (ref 1.4–7.0)
Neutrophils: 76 %
PLATELETS: 352 10*3/uL (ref 150–450)
RBC: 3.86 x10E6/uL — ABNORMAL LOW (ref 4.14–5.80)
RDW: 13.7 % (ref 12.3–15.4)
WBC: 11.4 10*3/uL — ABNORMAL HIGH (ref 3.4–10.8)

## 2018-02-27 LAB — BASIC METABOLIC PANEL
BUN / CREAT RATIO: 12 (ref 10–24)
BUN: 34 mg/dL — AB (ref 8–27)
CHLORIDE: 102 mmol/L (ref 96–106)
CO2: 21 mmol/L (ref 20–29)
Calcium: 9.1 mg/dL (ref 8.6–10.2)
Creatinine, Ser: 2.82 mg/dL — ABNORMAL HIGH (ref 0.76–1.27)
GFR calc non Af Amer: 20 mL/min/{1.73_m2} — ABNORMAL LOW (ref >59)
GFR, EST AFRICAN AMERICAN: 24 mL/min/{1.73_m2} — AB (ref >59)
Glucose: 263 mg/dL — ABNORMAL HIGH (ref 65–99)
Potassium: 5.4 mmol/L — ABNORMAL HIGH (ref 3.5–5.2)
SODIUM: 138 mmol/L (ref 134–144)

## 2018-03-05 ENCOUNTER — Telehealth (INDEPENDENT_AMBULATORY_CARE_PROVIDER_SITE_OTHER): Payer: Self-pay

## 2018-03-05 ENCOUNTER — Ambulatory Visit (INDEPENDENT_AMBULATORY_CARE_PROVIDER_SITE_OTHER): Payer: Medicare Other | Admitting: Adult Health

## 2018-03-05 ENCOUNTER — Encounter: Payer: Self-pay | Admitting: Adult Health

## 2018-03-05 VITALS — BP 135/56 | HR 71 | Ht 62.0 in | Wt 105.2 lb

## 2018-03-05 DIAGNOSIS — I63442 Cerebral infarction due to embolism of left cerebellar artery: Secondary | ICD-10-CM

## 2018-03-05 DIAGNOSIS — I10 Essential (primary) hypertension: Secondary | ICD-10-CM

## 2018-03-05 DIAGNOSIS — H548 Legal blindness, as defined in USA: Secondary | ICD-10-CM

## 2018-03-05 DIAGNOSIS — E785 Hyperlipidemia, unspecified: Secondary | ICD-10-CM

## 2018-03-05 DIAGNOSIS — E1121 Type 2 diabetes mellitus with diabetic nephropathy: Secondary | ICD-10-CM | POA: Diagnosis not present

## 2018-03-05 NOTE — Telephone Encounter (Signed)
Timothy [name] with [name] is aware that patient had abnormal but stable labs and that he should see a nephrologist. Referral has been placed and someone will contact either the facility or case manager to schedule patient. [name], CMA

## 2018-03-05 NOTE — Progress Notes (Signed)
[facility] [address] [place]. [place] [address] [phone]       OFFICE FOLLOW UP NOTE  [name] Date of Birth:  Dec 14, 1940 Medical Record Number:  [id number]   Reason for Referral:  hospital stroke follow up  CHIEF COMPLAINT:  Chief Complaint  Patient presents with  . New Patient (Initial Visit)    Hospital follow up. Pt does not speak english. [facility] appointed interpreter, Timothy [name], is here.   . Other    Caretaker from the assisted living facility is Timothy [name].     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 History obtained from patient, caretaker, interpreter and chart review. Reviewed all radiology images and labs personally.  [name] is a 77 y.o. male with history of ETOH use, cataracts., DB, HTN, HLD, HOH, blind, dementia who presented with altered mental status and worsening vision complaints.  CT had reviewed and showed left cerebellar infarct along with left frontal lobe infarct with indeterminate age.  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  MRA negative for acute findings.  Carotid Doppler showed bilateral ICA stenosis of 1 to 39%.  2D echo showed an EF of 55 to 60% without evidence of PFO or cardiac embolism.  It was felt as though recent infarcts were likely embolic but due to baseline of dementia and blindness, he was seen to not be at a good anticoagulation candidate therefore no further testing was recommended to assess for embolic source.  LDL 58 and recommended continuation of Lipitor.  A1c 7.1 and recommended continued follow-up and management by PCP.  HTN elevated during admission but stable and recommended long-term BP goal normotensive range.  Recommended DAPT for 3 weeks and aspirin alone.  Per notes, patient was agitated during admission that required physical  and chemical restraints most likely related to hospital delirium.  Recommended to return to prior SNF once stabilized and was discharged in stable condition.  Patient return to ED on 02/20/2018 from ALF due to staff observing facial droop and left arm weakness.  Imaging did not show show acute stroke.  Diagnosed with possible TIA as symptoms resolved and was discharged back to facility.  Patient is being seen today for hospital follow up and is accompanied by Timothy [name] interpreter. Patient does have a baseline of being legally blind and unable to determine if he continues to have worsening vision since his prior stroke. He is completely blind in right eye but able to see shadows from his left eye. Completed 3 weeks of DAPT and currently on aspirin alone without side effects of bleeding or bruising. Continues to take Crestor without side effects of myalgias. Blood pressure satisfactory at 135/56. He continues to reside at [facility] which he has been a long term resident there prior to his stroke. He denies any complaints with his memory at this time. No further concerns at this time. Denies new or worsening stroke/TIA symptoms.   ROS:   14 system review of systems performed and negative with exception of vision loss  PMH:  Past Medical History:  Diagnosis Date  . Alcohol abuse    Patient denies, but was shared with people from [facility] by his male roommate.  . Bilateral cataracts 04/08/2017  . DM (diabetes mellitus), type 2 with renal complications ([place])    uncertain when diagnosed  .  HOH (hard of hearing)   . Hypertension   . Legally blind    B/L  . Poor vision     PSH:  Past Surgical History:  Procedure Laterality Date  . MEMBRANE PEEL Right 09/29/2017   Procedure: MEMBRANE PEEL WITH AIR GAS;  Surgeon: Jalene Mullet, MD;  Location: [facility];  Service: Ophthalmology;  Laterality: Right;  . PARS PLANA VITRECTOMY Left 06/29/2017   Procedure: PARS PLANA VITRECTOMY WITH 25 GAUGE AND  MEMBRANE PEEL WITH GAS EXCHANGE, AIR SILICONE OIL AND PHACO COAGULATION, LASER;  Surgeon: Jalene Mullet, MD;  Location: [facility];  Service: Ophthalmology;  Laterality: Left;  . PARS PLANA VITRECTOMY Right 09/18/2017   Procedure: PARS PLANA VITRECTOMY WITH 25 GAUGE MEMBRANE PILL WITH AIR GAS SILICONE OIL AND PHOTOCOAGULATION;  Surgeon: Jalene Mullet, MD;  Location: [facility];  Service: Ophthalmology;  Laterality: Right;  . PARS PLANA VITRECTOMY Right 09/29/2017   Procedure: PARS PLANA VITRECTOMY WITH 25 GAUGE;  Surgeon: Jalene Mullet, MD;  Location: [facility];  Service: Ophthalmology;  Laterality: Right;  . PHOTOCOAGULATION Right 09/29/2017   Procedure: PHOTOCOAGULATION;  Surgeon: Jalene Mullet, MD;  Location: [facility];  Service: Ophthalmology;  Laterality: Right;  . 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 Marital status: Married    Spouse name: Not on file  . Number of children: 4  . Years of education: 0  . Highest education level: Not on file  Occupational History  . Occupation: unemployed  Social Needs  . Financial resource strain: Not on file  . Food insecurity:    Worry: Not on file    Inability: Not on file  . Transportation needs:    Medical: Not on file    Non-medical: Not on file  Tobacco Use  . Smoking status: Former Smoker    Types: Cigarettes    Last attempt to quit: 05/19/1990    Years since quitting: 27.8  . Smokeless tobacco: Never Used  Substance and Sexual Activity  . 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 Drug use: No  . Sexual activity: Not on file  Lifestyle  . Physical activity:    Days per week: Not on file    Minutes per session: Not on file  . Stress: Not  on file  Relationships  . Social connections:    Talks on phone: Not on file    Gets together: Not on file    Attends religious service: Not on file    Active member of club or organization: Not on file    Attends meetings of clubs or organizations: Not on file    Relationship status: Not on file  . Intimate partner violence:    Fear of current or ex partner: Not on file    Emotionally abused: Not on file    Physically abused: Not on file    Forced sexual activity: Not on file  Other Topics Concern  . Not on file  Social History Narrative   Originally from [place]   [place]   Was in camp in [place] for 3-4 months.   [place]ame [place] In [place].  Initially in [place].   He came here with his nephew and lived  together until the house where he was living was sold, then went separate ways.  Not clear how long ago this was.   Hmuin Rcom, with whom he was recently lived with at [place], stated he moved from place to place until kicked out until he lived with her for past year. (2018)   Used to work in Consulting civil engineer.      Looking at possibility of [employer] or [employer].  [place] male already living there.      Never attended school.  Does not read or write in his language of [place].      Left [place] as fleeing communists.  Denies any physical injury/toruture or imprisonment.   He did fight in the war in [place].     Left his wife behind in their home.                  Family History:  Family History  Problem Relation Age of Onset  . Lung disease Neg Hx     Medications:   Current Outpatient Medications on File Prior to Visit  Medication Sig Dispense Refill  . acetaminophen (TYLENOL) 325 MG tablet Take 650 mg by mouth every 6 (six) hours as needed (general discomfort).    Marland Kitchen albuterol (PROVENTIL HFA;VENTOLIN HFA) 108 (90 Base) MCG/ACT inhaler Inhale 2 puffs into the lungs every 6 (six) hours as needed for wheezing or shortness of breath. 1 Inhaler 2  .  amLODipine (NORVASC) 10 MG tablet Take 1 tablet (10 mg total) by mouth daily. 90 tablet 1  . aspirin 81 MG chewable tablet Chew 1 tablet (81 mg total) by mouth daily. 90 tablet 1  . carvedilol (COREG) 6.25 MG tablet Take 1 tablet (6.25 mg total) by mouth 2 (two) times daily with a meal. 180 tablet 1  . Difluprednate (DUREZOL) 0.05 % EMUL Place 1 drop into the right eye 4 (four) times daily.    . dorzolamide-timolol (COSOPT) 22.3-6.8 MG/ML ophthalmic solution Place 1 drop into the right eye 2 (two) times daily.    . ferrous gluconate (FERGON) 324 MG tablet Take 1 tablet (324 mg total) by mouth daily with breakfast. 30 tablet 5  . folic acid (FOLVITE) 1 MG tablet Take 1 tablet (1 mg total) by mouth daily. 30 tablet 5  . hydrALAZINE (APRESOLINE) 25 MG tablet Take 1 tablet (25 mg total) by mouth every 8 (eight) hours. 270 tablet 1  . isosorbide mononitrate (IMDUR) 30 MG 24 hr tablet Take 1 tablet (30 mg total) by mouth at bedtime. 90 tablet 1  . ketorolac (ACULAR) 0.4 % SOLN Place 1 drop into the right eye 4 (four) times daily.    . prednisoLONE acetate (PRED FORTE) 1 % ophthalmic suspension Place 1 drop into the right eye 4 (four) times daily.    . rosuvastatin (CRESTOR) 5 MG tablet Take 1 tablet (5 mg total) by mouth every evening. 90 tablet 1  . sodium bicarbonate 650 MG tablet Take 1 tablet (650 mg total) by mouth 2 (two) times daily. 30 tablet 0  . tamsulosin (FLOMAX) 0.4 MG CAPS capsule Take 1 capsule (0.4 mg total) by mouth daily. 90 capsule 1  . thiamine (VITAMIN B-1) 100 MG tablet Take 100 mg by mouth daily.    Marland Kitchen omega-3 acid ethyl esters (LOVAZA) 1 g capsule Take 2 capsules (2 g total) by mouth 2 (two) times daily. 90 capsule 1   No current facility-administered medications on file prior to visit.  Allergies:  No Known Allergies   Physical Exam  Vitals:   03/05/18 0948  BP: (!) 135/56  Pulse: 71  Weight: 105 lb 3.2 oz (47.7 kg)  Height: 5\' 2"  (1.575 m)   Body mass index is  19.24 kg/m. No exam data present  General: well developed, well nourished, seated, in no evident distress Head: head normocephalic and atraumatic.   Neck: supple with no carotid or supraclavicular bruits Cardiovascular: regular rate and rhythm, no murmurs Musculoskeletal: no deformity Skin:  no rash/petichiae Vascular:  Normal pulses all extremities  Neurologic Exam Mental Status: Awake and fully alert. Oriented to place and time. Recent and remote memory intact. Attention span, concentration and fund of knowledge appropriate. Mood and affect appropriate.  Cranial Nerves: Fundoscopic exam unable to see discs. Pupils fixed with right pupil irregular shape.  Patient legally blind.  Hearing intact. Facial sensation intact. Face, tongue, palate moves normally and symmetrically.  Motor: Normal bulk and tone. Normal strength in all tested extremity muscles. Sensory.: intact to touch , pinprick , position and vibratory sensation.  Coordination: Rapid alternating movements normal in all extremities. Finger-to-nose and heel-to-shin performed accurately bilaterally. Gait and Station: Arises from chair without difficulty. Stance is normal. Gait demonstrates normal stride length and balance with assistance of cane. Reflexes: 1+ and symmetric. Toes downgoing.    NIHSS  0 Modified Rankin  0    Diagnostic Data (Labs, Imaging, Testing)  CT HEAD WO CONTRAST 01/18/2018 IMPRESSION: Large area of low attenuation within the left cerebellar hemisphere most compatible with subacute infarct. Small area of low attenuation within the left frontal lobe most compatible with age-indeterminate infarct.  MR BRAIN WO CONTRAST MR MRA HEAD  01/18/2018 IMPRESSION: 1. Patchy acute infarct along the cerebral convexities and left corona radiata. These are seen in the anterior and posterior circulation and suggest central embolic disease. 2. Remote left PICA infarct. Multiple remote lacunar infarcts. 3. Remote  external capsule hemorrhage on the right. 4. Motion degraded intracranial MRA without acute finding.  ECHOCARDIOGRAM 01/20/2018 Study Conclusions - Left ventricle: The cavity size was normal. Systolic function was   normal. The estimated ejection fraction was in the range of 55%   to 60%. Wall motion was normal; there were no regional wall   motion abnormalities. Doppler parameters are consistent with   abnormal left ventricular relaxation (grade 1 diastolic   dysfunction). - Aortic valve: There was mild regurgitation. - Mitral valve: There was trivial regurgitation. - Left atrium: The atrium was moderately dilated. - Atrial septum: No obvious defect or patent foramen ovale was   identified by color flow Doppler. There was an atrial septal   aneurysm. - Tricuspid valve: There was trivial regurgitation. - Pericardium, extracardiac: A trivial pericardial effusion was   identified posterior to the heart.   ASSESSMENT: Timothy [name] is a 77 y.o. 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 Vascular risk factors include EtOH use, DM, HTN, HLD and dementia.  Patient returns today for hospital follow-up and overall has been stable from stroke standpoint without residual deficits or recurring of symptoms.    PLAN: -Continue aspirin 81 mg daily  and crestor  for secondary stroke prevention -F/u with PCP regarding your cholesterol, blood pressure, diabetes management -continue to monitor BP at home -advised to continue to stay active and maintain a healthy diet -Maintain strict control  of hypertension with blood pressure goal below 130/90, diabetes with hemoglobin A1c goal below 6.5% and cholesterol with LDL cholesterol (bad cholesterol) goal below 70 mg/dL. I also advised the patient to eat a healthy diet with plenty of whole grains,  cereals, fruits and vegetables, exercise regularly and maintain ideal body weight.  Follow up in 3 months or call earlier if needed   Greater than 50% of time during this 25 minute visit was spent on counseling,explanation of diagnosis of left cerebellar and coronal radiata infarcts, reviewing risk factor management of DM, HTN, HLD and dementia, planning of further management, discussion with patient and family and coordination of care    Timothy [name], AGNP-BC  [facility] [address] [place], [place] [address]  Phone [phone] Fax [phone] Note: This document was prepared with digital dictation and possible smart phrase technology. Any transcriptional errors that result from this process are unintentional.

## 2018-03-05 NOTE — Patient Instructions (Addendum)
Continue aspirin 81 mg daily  and Crestor  for secondary stroke prevention  Continue to follow up with PCP regarding cholesterol and blood pressure management along with need for management of diabetes  Continue to stay active and maintain a healthy diet  Continue to monitor blood pressure at home  Maintain strict control of hypertension with blood pressure goal below 130/90, diabetes with hemoglobin A1c goal below 6.5% and cholesterol with LDL cholesterol (bad cholesterol) goal below 70 mg/dL. I also advised the patient to eat a healthy diet with plenty of whole grains, cereals, fruits and vegetables, exercise regularly and maintain ideal body weight.  Followup in the future with me in 3 months or call earlier if needed       Thank you for coming to see [name] at [facility]. I hope we have been able to provide you high quality care today.  You may receive a patient satisfaction survey over the next few weeks. We would appreciate your feedback and comments so that we may continue to improve ourselves and the health of our patients.

## 2018-03-05 NOTE — Telephone Encounter (Signed)
-----   Message from [name], PA-C sent at 03/01/2018  1:43 PM EDT ----- Abnormal labs but stable. He should see nephrologist, already referred at last visit.

## 2018-03-08 NOTE — Progress Notes (Signed)
I agree with the above plan 

## 2018-03-09 ENCOUNTER — Ambulatory Visit (INDEPENDENT_AMBULATORY_CARE_PROVIDER_SITE_OTHER): Payer: Medicare Other | Admitting: Physician Assistant

## 2018-03-09 ENCOUNTER — Telehealth (INDEPENDENT_AMBULATORY_CARE_PROVIDER_SITE_OTHER): Payer: Self-pay | Admitting: Physician Assistant

## 2018-03-09 NOTE — Telephone Encounter (Signed)
Wells Guiles from [employer] called stating that they had a meeting today with the Education officer, museum and had concerns. Wells Guiles wanted to know if there was any other way to take omega-3 acid ethyl esters (LOVAZA) 1 g capsule  Due to Mr. Tyge refusing to take the medications since it is hard for him to swallow. Wells Guiles also wanted an order for Accu Chek in order to check his blood sugar at least once a week and would also like an order to check his blood pressure due to Mr.Nathin having a stroke. Wells Guiles also stated she is going to fax a FL2 form and that the orders could be address on the form.   Please Advise [phone]  Thank you Emmit Pomfret

## 2018-03-09 NOTE — Telephone Encounter (Signed)
FWD to PCP. Vineet Kinney S Donyale Falcon, CMA  

## 2018-03-10 ENCOUNTER — Other Ambulatory Visit (INDEPENDENT_AMBULATORY_CARE_PROVIDER_SITE_OTHER): Payer: Self-pay | Admitting: Physician Assistant

## 2018-03-10 MED ORDER — BLOOD GLUCOSE MONITOR KIT: PACK | 0 refills | Status: DC

## 2018-03-10 MED ORDER — BLOOD PRESSURE MONITOR/ARM DEVI: 1.0000 | Freq: Every day | 0 refills | Status: DC | PRN

## 2018-03-10 MED ORDER — EMULSIFIED OMEGA-3 712-1024 MG/15ML PO LIQD: 15.0000 mL | Freq: Every day | ORAL | 5 refills | Status: DC

## 2018-03-10 NOTE — Telephone Encounter (Signed)
I have sent liquid form omega 3 fish oil to [facility]. I will print the prescription for glucometer and BP cuff. I will wait for the FL2.

## 2018-03-26 ENCOUNTER — Ambulatory Visit (INDEPENDENT_AMBULATORY_CARE_PROVIDER_SITE_OTHER): Payer: Medicare Other | Admitting: Internal Medicine

## 2018-03-26 ENCOUNTER — Encounter: Payer: Self-pay | Admitting: Internal Medicine

## 2018-03-26 VITALS — BP 118/60 | HR 64 | Resp 12 | Ht 62.0 in | Wt 97.0 lb

## 2018-03-26 DIAGNOSIS — I6340 Cerebral infarction due to embolism of unspecified cerebral artery: Secondary | ICD-10-CM

## 2018-03-26 DIAGNOSIS — N189 Chronic kidney disease, unspecified: Secondary | ICD-10-CM

## 2018-03-26 DIAGNOSIS — F0391 Unspecified dementia with behavioral disturbance: Secondary | ICD-10-CM | POA: Diagnosis not present

## 2018-03-26 DIAGNOSIS — Z0271 Encounter for disability determination: Secondary | ICD-10-CM

## 2018-03-26 DIAGNOSIS — F0390 Unspecified dementia without behavioral disturbance: Secondary | ICD-10-CM | POA: Insufficient documentation

## 2018-03-26 DIAGNOSIS — D649 Anemia, unspecified: Secondary | ICD-10-CM | POA: Insufficient documentation

## 2018-03-26 DIAGNOSIS — R4189 Other symptoms and signs involving cognitive functions and awareness: Secondary | ICD-10-CM

## 2018-03-26 DIAGNOSIS — D631 Anemia in chronic kidney disease: Secondary | ICD-10-CM

## 2018-03-26 NOTE — Progress Notes (Signed)
Subjective:   Patient ID: Timothy [name], male    DOB: 10/13/1940, 77 y.o.   MRN: [id number]  HPI   Here to be evaluated for Medical Certification for Disability Exceptions.  Patient previously assessed here 03/16/2017 and 04/08/2017 follow up for NH placement.  Has been followed at [facility] by R. Altamease Oiler, PA-C for primary care. Recent hospitalizations,September 2. 2019 for acute left cerebellar stroke and findings of small patchy acute infarcts most obvious in right occipital lobe and anterior left frontal lobe and left corona radiata.  Remote lacunar infarcts in bilateral thalamus, right caudate and pons These are felt to be embolic in origin.  No source of emboli with echo, carotid dopplers, MRA.  Second admission on October 5th, a TIA with unknown side facial drooping and left arm weakness.  Current diagnoses as listed in PMH: Past Medical History:  Diagnosis Date  . Alcohol abuse    Patient denies, but was shared with people from [facility] by his male roommate.  . Anemia    chronic disease/renal disease  . Bilateral cataracts 04/08/2017  . Dementia ([place])   . DM (diabetes mellitus), type 2 with renal complications ([facility])    uncertain when diagnosed  . HOH (hard of hearing)   . Hypertension   . Legally blind    B/L  . Stroke [facility]) 08/8887   Embolic     Past Surgical History:  Procedure Laterality Date  . MEMBRANE PEEL Right 09/29/2017   Procedure: MEMBRANE PEEL WITH AIR GAS;  Surgeon: Jalene Mullet, MD;  Location: [place];  Service: Ophthalmology;  Laterality: Right;  . PARS PLANA VITRECTOMY Left 06/29/2017   Procedure: PARS PLANA VITRECTOMY WITH 25 GAUGE AND MEMBRANE PEEL WITH GAS EXCHANGE, AIR SILICONE OIL AND PHACO COAGULATION, LASER;  Surgeon: Jalene Mullet, MD;  Location: [place];  Service: Ophthalmology;  Laterality: Left;  . PARS PLANA VITRECTOMY Right 09/18/2017   Procedure: PARS PLANA VITRECTOMY WITH 25 GAUGE MEMBRANE PILL WITH AIR GAS SILICONE OIL AND  PHOTOCOAGULATION;  Surgeon: Jalene Mullet, MD;  Location: [place];  Service: Ophthalmology;  Laterality: Right;  . PARS PLANA VITRECTOMY Right 09/29/2017   Procedure: PARS PLANA VITRECTOMY WITH 25 GAUGE;  Surgeon: Jalene Mullet, MD;  Location: [place];  Service: Ophthalmology;  Laterality: Right;  . PHOTOCOAGULATION Right 09/29/2017   Procedure: PHOTOCOAGULATION;  Surgeon: Jalene Mullet, MD;  Location: [place];  Service: Ophthalmology;  Laterality: Right;  . 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 acetaminophen (TYLENOL) 325 MG tablet Take 650 mg by mouth every 6 (six) hours as needed.  Marland Kitchen albuterol (PROVENTIL HFA;VENTOLIN HFA) 108 (90 Base) MCG/ACT inhaler Inhale 2 puffs into the lungs every 6 (six) hours as needed for wheezing or shortness of breath.  Marland Kitchen amLODipine (NORVASC) 10 MG tablet Take 1 tablet (10 mg total) by mouth daily.  Marland Kitchen aspirin 81 MG chewable tablet Chew 1 tablet (81 mg total) by mouth daily.  . carvedilol (COREG) 6.25 MG tablet Take 1 tablet (6.25 mg total) by mouth 2 (two) times daily with a meal.  . Difluprednate (DUREZOL) 0.05 % EMUL Place 1 drop into the right eye 4 (four) times daily.  . dorzolamide-timolol (COSOPT) 22.3-6.8 MG/ML ophthalmic solution Place 1 drop into the right eye 2 (two) times daily.  . ferrous gluconate (FERGON) 324 MG  tablet Take 1 tablet (324 mg total) by mouth daily with breakfast.  . folic acid (FOLVITE) 1 MG tablet Take 1 tablet (1 mg total) by mouth daily.  . hydrALAZINE (APRESOLINE) 25 MG tablet Take 1 tablet (25 mg total) by mouth every 8 (eight) hours.  . isosorbide mononitrate (IMDUR) 30 MG 24 hr tablet Take 1 tablet (30 mg total) by mouth at bedtime.  Marland Kitchen ketorolac (ACULAR) 0.4 % SOLN Place 1 drop into the right eye 4 (four) times daily.  .  Omega-3 Fatty Acids (EMULSIFIED OMEGA-3) [phone] MG/15ML LIQD Take 15 mLs by mouth daily.  . prednisoLONE acetate (PRED FORTE) 1 % ophthalmic suspension Place 1 drop into the right eye 4 (four) times daily.  . rosuvastatin (CRESTOR) 5 MG tablet Take 1 tablet (5 mg total) by mouth every evening.  . sodium bicarbonate 650 MG tablet Take 1 tablet (650 mg total) by mouth 2 (two) times daily.  . tamsulosin (FLOMAX) 0.4 MG CAPS capsule Take 1 capsule (0.4 mg total) by mouth daily.  Marland Kitchen thiamine (VITAMIN B-1) 100 MG tablet Take 100 mg by mouth daily.  . [DISCONTINUED] acetaminophen (TYLENOL) 325 MG tablet Take 650 mg by mouth every 6 (six) hours as needed (general discomfort).    No Known Allergies   Family History  Problem Relation Age of Onset  . Lung disease Neg Hx     Social History   Socioeconomic History  . Marital status: Married    Spouse name: Not on file  . Number of children: 4  . Years of education: 0  . Highest education level: Not on file  Occupational History  . Occupation: unemployed  Social Needs  . Financial resource strain: Not on file  . Food insecurity:    Worry: Not on file    Inability: Not on file  . Transportation needs:    Medical: Not on file    Non-medical: Not on file  Tobacco Use  . Smoking status: Former Smoker    Types: Cigarettes    Last attempt to quit: 05/19/1990    Years since quitting: 27.8  . Smokeless tobacco: Never Used  Substance and Sexual Activity  . Alcohol use: Yes    [name]: UTA he drinks "alot" according to grandson; last drink 01/2017 per Education officer, museum  . Drug use: No  . Sexual activity: Not on file  Lifestyle  . Physical activity:    Days per week: Not on file    Minutes per session: Not on file  . Stress: Not on file  Relationships  . Social connections:    Talks on phone: Not on file    Gets together: Not on file    Attends religious service: Not on file    Active member of club or organization: Not on file    Attends  meetings of clubs or organizations: Not on file    Relationship status: Not on file  . Intimate partner violence:    Fear of current or ex partner: Not on file    Emotionally abused: Not on file    Physically abused: Not on file    Forced sexual activity: Not on file  Other Topics Concern  . Not on file  Social History Narrative   Originally from [place]   [place]   Was in camp in [place] for 3-4 months.   [place] In [place].  Initially in [place].   He came here with his nephew and lived together until the house where  he was living was sold, then went separate ways.  Not clear how long ago this was.   Hmuin Rcom, with whom he was recently lived with at [place], stated he moved from place to place until kicked out until he lived with her for past year. (2018)   Used to work in Consulting civil engineer.      Looking at possibility of [employer] or [facility].  [place] male already living there.      Never attended school.  Does not read or write in his language of [place].      Left [place] as fleeing communists.  Denies any physical injury/torture or imprisonment.   He did fight in the war in [place].     Left his wife behind in their home.                Addendum to social and living situation:  Has been a resident of [facility] since end of 2018. Also, patient unable to share any of above social history with me today.  He did share this information with me in October of 2018 when initially assessed. States he cannot recall much of his history.  Review of Systems    Objective:  Physical Exam   MMSE - Mini Mental State Exam 03/26/2018  Orientation to time 0  Orientation to Place 1  Registration 3  Attention/ Calculation 0  Recall 2  Language- name 2 objects 2  Language- repeat 1  Language- follow 3 step command 2  Language- read & follow direction 0  Write a sentence 0  Copy design 0  Total score 11   Chronically ill appearing male, NAD  HEENT:   Left pupil 2 mm with minimal reactivity to light and dense cataracts.  Right pupil with what appears to be previous iridectomy and deformity/lack of temporal iris and bubble in pupil area.  TMs pearly gray.  Diffuse and severe dental decay.  Mouth is asymmetric with possible mild left lip droop.   Neck:  Supple, No adenopathy, no thyromegaly Chest:  Scant dry crackles in left lower posterior lung field, otherwise CTA CV:  RRR with normal S1 and S2, No S3, S4 or murmur.  No carotid bruits, carotid, radial, femoral, DP pulses normal and equal Abd:  S, NT, No HSM or mass, + BS Neuro:  Alert, oriented only to person and place.  CN II-XII grossly intact-left facial disappears with showing teeth.  Motor 5/5 and DTRs 2+/4 throughout.  Appears to have some truncal ataxia, more pronounced when walking.  Finger to nose to finger with past pointing, but difficult to know how much is due to poor vision.  Rapid alternating motion and heel to shin abnormal.  Romberg mildly positive.  Tremulous gait, requires cane for balance.    Assessment & Plan:  Patient with multiple chronic health issues leading to significant decrease in cognitive/memory abilities. N 648 paperwork completed and sent to [facility]

## 2018-03-29 ENCOUNTER — Encounter (INDEPENDENT_AMBULATORY_CARE_PROVIDER_SITE_OTHER): Payer: Self-pay | Admitting: Physician Assistant

## 2018-03-29 ENCOUNTER — Ambulatory Visit (INDEPENDENT_AMBULATORY_CARE_PROVIDER_SITE_OTHER): Payer: Medicare Other | Admitting: Physician Assistant

## 2018-03-29 ENCOUNTER — Encounter (INDEPENDENT_AMBULATORY_CARE_PROVIDER_SITE_OTHER): Payer: Self-pay

## 2018-03-29 VITALS — Ht 62.0 in | Wt 103.6 lb

## 2018-03-29 DIAGNOSIS — Z5321 Procedure and treatment not carried out due to patient leaving prior to being seen by health care provider: Secondary | ICD-10-CM

## 2018-03-30 NOTE — Progress Notes (Signed)
Pt reported for appointment but no interpretor was available for him. Pt had to reschedule.

## 2018-04-12 ENCOUNTER — Encounter: Payer: Self-pay | Admitting: Internal Medicine

## 2018-04-12 DIAGNOSIS — E785 Hyperlipidemia, unspecified: Secondary | ICD-10-CM | POA: Insufficient documentation

## 2018-04-14 ENCOUNTER — Ambulatory Visit (INDEPENDENT_AMBULATORY_CARE_PROVIDER_SITE_OTHER): Payer: Medicare Other | Admitting: Physician Assistant

## 2018-04-28 ENCOUNTER — Ambulatory Visit (INDEPENDENT_AMBULATORY_CARE_PROVIDER_SITE_OTHER): Payer: Medicare Other | Admitting: Physician Assistant

## 2018-04-28 ENCOUNTER — Encounter (INDEPENDENT_AMBULATORY_CARE_PROVIDER_SITE_OTHER): Payer: Self-pay | Admitting: Physician Assistant

## 2018-04-28 ENCOUNTER — Other Ambulatory Visit: Payer: Self-pay

## 2018-04-28 VITALS — BP 155/69 | HR 62 | Temp 97.7°F | Ht 62.0 in | Wt 101.8 lb

## 2018-04-28 DIAGNOSIS — I129 Hypertensive chronic kidney disease with stage 1 through stage 4 chronic kidney disease, or unspecified chronic kidney disease: Secondary | ICD-10-CM

## 2018-04-28 DIAGNOSIS — N183 Chronic kidney disease, stage 3 unspecified: Secondary | ICD-10-CM

## 2018-04-28 DIAGNOSIS — Z760 Encounter for issue of repeat prescription: Secondary | ICD-10-CM

## 2018-04-28 DIAGNOSIS — I10 Essential (primary) hypertension: Secondary | ICD-10-CM

## 2018-04-28 MED ORDER — ASPIRIN 81 MG PO CHEW: 81.0000 mg | CHEWABLE_TABLET | Freq: Every day | ORAL | 1 refills | Status: DC

## 2018-04-28 MED ORDER — TERAZOSIN HCL 5 MG PO CAPS: 5.0000 mg | ORAL_CAPSULE | Freq: Every day | ORAL | 1 refills | Status: DC

## 2018-04-28 MED ORDER — EMULSIFIED OMEGA-3 712-1024 MG/15ML PO LIQD: 15.0000 mL | Freq: Every day | ORAL | 5 refills | Status: DC

## 2018-04-28 MED ORDER — ISOSORBIDE MONONITRATE ER 30 MG PO TB24: 30.0000 mg | ORAL_TABLET | Freq: Every day | ORAL | 1 refills | Status: DC

## 2018-04-28 MED ORDER — CARVEDILOL 6.25 MG PO TABS: 6.2500 mg | ORAL_TABLET | Freq: Two times a day (BID) | ORAL | 1 refills | Status: DC

## 2018-04-28 MED ORDER — ROSUVASTATIN CALCIUM 5 MG PO TABS: 5.0000 mg | ORAL_TABLET | Freq: Every evening | ORAL | 1 refills | Status: DC

## 2018-04-28 MED ORDER — FOLIC ACID 1 MG PO TABS: 1.0000 mg | ORAL_TABLET | Freq: Every day | ORAL | 1 refills | Status: DC

## 2018-04-28 MED ORDER — AMLODIPINE BESYLATE 10 MG PO TABS: 10.0000 mg | ORAL_TABLET | Freq: Every day | ORAL | 1 refills | Status: DC

## 2018-04-28 MED ORDER — HYDRALAZINE HCL 50 MG PO TABS: 50.0000 mg | ORAL_TABLET | Freq: Three times a day (TID) | ORAL | 1 refills | Status: DC

## 2018-04-28 NOTE — Patient Instructions (Signed)

## 2018-04-28 NOTE — Progress Notes (Signed)
Subjective:  Patient ID: Timothy [name], male    DOB: 02/10/1941  Age: 77 y.o. MRN: [id number]  CC: HTN and CKD  HPI Timothy [name] a 77 y.o.malewith a medical history of HTN, Hypertriglyceridemia, DM2, CKD3, anemia, blindness, atherosclerosis, and partial hearing loss presents to f/u on HTN and CKD. Last BP 147/65 mmHg two months ago. All his anti-hypertensives were refilled. He is at [facility] and has his medications dispensed to him. Does not know which medications he is taking. Initial BP 187/56 mmHg today. Recheck shows 155/69 mmHg. Says he is feeling well and has no complaints.     In regards to CKD3, pt was referred to nephrology at [facility]. Went to [place] on 04/02/18 according to PCA but no notes available for review.    *Live interpreter services used for this encounter.      Outpatient Medications Prior to Visit  Medication Sig Dispense Refill  . acetaminophen (TYLENOL) 325 MG tablet Take 650 mg by mouth every 6 (six) hours as needed.    Marland Kitchen albuterol (PROVENTIL HFA;VENTOLIN HFA) 108 (90 Base) MCG/ACT inhaler Inhale 2 puffs into the lungs every 6 (six) hours as needed for wheezing or shortness of breath. 1 Inhaler 2  . amLODipine (NORVASC) 10 MG tablet Take 1 tablet (10 mg total) by mouth daily. 90 tablet 1  . aspirin 81 MG chewable tablet Chew 1 tablet (81 mg total) by mouth daily. 90 tablet 1  . Blood Pressure Monitoring (BLOOD PRESSURE MONITOR/ARM) DEVI 1 each by Does not apply route daily as needed. 1 Device 0  . carvedilol (COREG) 6.25 MG tablet Take 1 tablet (6.25 mg total) by mouth 2 (two) times daily with a meal. 180 tablet 1  . Difluprednate (DUREZOL) 0.05 % EMUL Place 1 drop into the right eye 4 (four) times daily.    . dorzolamide-timolol (COSOPT) 22.3-6.8 MG/ML ophthalmic solution Place 1 drop into the right eye 2 (two) times daily.    . ferrous gluconate (FERGON) 324 MG tablet Take 1 tablet (324 mg total) by mouth daily with breakfast. 30 tablet 5  .  folic acid (FOLVITE) 1 MG tablet Take 1 tablet (1 mg total) by mouth daily. 30 tablet 5  . hydrALAZINE (APRESOLINE) 25 MG tablet Take 1 tablet (25 mg total) by mouth every 8 (eight) hours. 270 tablet 1  . isosorbide mononitrate (IMDUR) 30 MG 24 hr tablet Take 1 tablet (30 mg total) by mouth at bedtime. 90 tablet 1  . ketorolac (ACULAR) 0.4 % SOLN Place 1 drop into the right eye 4 (four) times daily.    . Omega-3 Fatty Acids (EMULSIFIED OMEGA-3) [phone] MG/15ML LIQD Take 15 mLs by mouth daily. 355 mL 5  . prednisoLONE acetate (PRED FORTE) 1 % ophthalmic suspension Place 1 drop into the right eye 4 (four) times daily.    . rosuvastatin (CRESTOR) 5 MG tablet Take 1 tablet (5 mg total) by mouth every evening. 90 tablet 1  . sodium bicarbonate 650 MG tablet Take 1 tablet (650 mg total) by mouth 2 (two) times daily. 30 tablet 0  . tamsulosin (FLOMAX) 0.4 MG CAPS capsule Take 1 capsule (0.4 mg total) by mouth daily. 90 capsule 1  . thiamine (VITAMIN B-1) 100 MG tablet Take 100 mg by mouth daily.     No facility-administered medications prior to visit.      ROS Review of Systems  Constitutional: Negative for chills, fever and malaise/fatigue.  Eyes: Negative for blurred vision.  Respiratory: Negative for  shortness of breath.   Cardiovascular: Negative for chest pain and palpitations.  Gastrointestinal: Negative for abdominal pain and nausea.  Genitourinary: Negative for dysuria and hematuria.  Musculoskeletal: Negative for joint pain and myalgias.  Skin: Negative for rash.  Neurological: Negative for tingling and headaches.  Psychiatric/Behavioral: Negative for depression. The patient is not nervous/anxious.     Objective:  BP (!) 187/56 (BP Location: Right Arm, Patient Position: Sitting, Cuff Size: Small)   Pulse 62   Temp 97.7 F (36.5 C) (Oral)   Ht 5\' 2"  (1.575 m)   Wt 101 lb 12.8 oz (46.2 kg)   SpO2 99%   BMI 18.62 kg/m   BP/Weight 04/28/2018 03/29/2018 63/0/1601  Systolic BP  093 - 235  Diastolic BP 56 - 60  Wt. (Lbs) 101.8 103.6 97  BMI 18.62 18.95 17.74      Physical Exam  Constitutional: He is oriented to person, place, and time.  Well developed, well nourished, NAD, polite  HENT:  Head: Normocephalic and atraumatic.  Eyes: No scleral icterus.  Neck: Normal range of motion. Neck supple. No thyromegaly present.  Cardiovascular: Normal rate, regular rhythm and normal heart sounds.  No LE edema bilaterally. No carotid bruit bilaterally.  Pulmonary/Chest: Effort normal and breath sounds normal.  Musculoskeletal: He exhibits no edema.  Neurological: He is alert and oriented to person, place, and time.  Skin: Skin is warm and dry. No rash noted. No erythema. No pallor.  Psychiatric: He has a normal mood and affect. His behavior is normal. Thought content normal.  Vitals reviewed.    Assessment & Plan:    1. Essential hypertension - terazosin (HYTRIN) 5 MG capsule; Take 1 capsule (5 mg total) by mouth at bedtime.  Dispense: 90 capsule; Refill: 1 - isosorbide mononitrate (IMDUR) 30 MG 24 hr tablet; Take 1 tablet (30 mg total) by mouth at bedtime.  Dispense: 90 tablet; Refill: 1 - CBC with Differential - Comprehensive metabolic panel - Lipid panel - carvedilol (COREG) 6.25 MG tablet; Take 1 tablet (6.25 mg total) by mouth 2 (two) times daily with a meal.  Dispense: 180 tablet; Refill: 1 - aspirin 81 MG chewable tablet; Chew 1 tablet (81 mg total) by mouth daily.  Dispense: 90 tablet; Refill: 1 - amLODipine (NORVASC) 10 MG tablet; Take 1 tablet (10 mg total) by mouth daily.  Dispense: 90 tablet; Refill: 1 - hydrALAZINE (APRESOLINE) 50 MG tablet; Take 1 tablet (50 mg total) by mouth 3 (three) times daily.  Dispense: 270 tablet; Refill: 1  2. Stage 3 chronic kidney disease [facility]) - Nephrology notes requested during this encounter.  3. Medication refill - rosuvastatin (CRESTOR) 5 MG tablet; Take 1 tablet (5 mg total) by mouth every evening.  Dispense: 90  tablet; Refill: 1 - Omega-3 Fatty Acids (EMULSIFIED OMEGA-3) [phone] MG/15ML LIQD; Take 15 mLs by mouth daily.  Dispense: 355 mL; Refill: 5 - folic acid (FOLVITE) 1 MG tablet; Take 1 tablet (1 mg total) by mouth daily.  Dispense: 90 tablet; Refill: 1   Meds ordered this encounter  Medications  . terazosin (HYTRIN) 5 MG capsule    Sig: Take 1 capsule (5 mg total) by mouth at bedtime.    Dispense:  90 capsule    Refill:  1    Order Specific Question:   Supervising Provider    Answer:   Charlott Rakes [[id number]]  . rosuvastatin (CRESTOR) 5 MG tablet    Sig: Take 1 tablet (5 mg total) by mouth every evening.  Dispense:  90 tablet    Refill:  1    Order Specific Question:   Supervising Provider    Answer:   Charlott Rakes [[id number]]  . Omega-3 Fatty Acids (EMULSIFIED OMEGA-3) [phone] MG/15ML LIQD    Sig: Take 15 mLs by mouth daily.    Dispense:  355 mL    Refill:  5    Order Specific Question:   Supervising Provider    Answer:   Charlott Rakes [[id number]]  . isosorbide mononitrate (IMDUR) 30 MG 24 hr tablet    Sig: Take 1 tablet (30 mg total) by mouth at bedtime.    Dispense:  90 tablet    Refill:  1    Order Specific Question:   Supervising Provider    Answer:   Charlott Rakes [[id number]]  . folic acid (FOLVITE) 1 MG tablet    Sig: Take 1 tablet (1 mg total) by mouth daily.    Dispense:  90 tablet    Refill:  1    If you continue to use alcohol    Order Specific Question:   Supervising Provider    Answer:   Charlott Rakes [[id number]]  . carvedilol (COREG) 6.25 MG tablet    Sig: Take 1 tablet (6.25 mg total) by mouth 2 (two) times daily with a meal.    Dispense:  180 tablet    Refill:  1    Order Specific Question:   Supervising Provider    Answer:   Charlott Rakes [[id number]]  . aspirin 81 MG chewable tablet    Sig: Chew 1 tablet (81 mg total) by mouth daily.    Dispense:  90 tablet    Refill:  1    Order Specific Question:   Supervising Provider    Answer:   Charlott Rakes [[id number]]  .  amLODipine (NORVASC) 10 MG tablet    Sig: Take 1 tablet (10 mg total) by mouth daily.    Dispense:  90 tablet    Refill:  1    Order Specific Question:   Supervising Provider    Answer:   Charlott Rakes [[id number]]  . hydrALAZINE (APRESOLINE) 50 MG tablet    Sig: Take 1 tablet (50 mg total) by mouth 3 (three) times daily.    Dispense:  270 tablet    Refill:  1    Order Specific Question:   Supervising Provider    Answer:   Charlott Rakes [[id number]]    Follow-up: Return in about 8 weeks (around 06/23/2018) for HTN f/u.   [name] PA

## 2018-04-29 ENCOUNTER — Other Ambulatory Visit (INDEPENDENT_AMBULATORY_CARE_PROVIDER_SITE_OTHER): Payer: Self-pay | Admitting: Physician Assistant

## 2018-04-29 LAB — CBC WITH DIFFERENTIAL/PLATELET
BASOS ABS: 0.1 10*3/uL (ref 0.0–0.2)
Basos: 1 %
EOS (ABSOLUTE): 0.5 10*3/uL — ABNORMAL HIGH (ref 0.0–0.4)
Eos: 5 %
HEMOGLOBIN: 10.3 g/dL — AB (ref 13.0–17.7)
Hematocrit: 33.3 % — ABNORMAL LOW (ref 37.5–51.0)
Immature Grans (Abs): 0 10*3/uL (ref 0.0–0.1)
Immature Granulocytes: 0 %
LYMPHS ABS: 1.8 10*3/uL (ref 0.7–3.1)
Lymphs: 19 %
MCH: 24.4 pg — ABNORMAL LOW (ref 26.6–33.0)
MCHC: 30.9 g/dL — AB (ref 31.5–35.7)
MCV: 79 fL (ref 79–97)
Monocytes Absolute: 0.5 10*3/uL (ref 0.1–0.9)
Monocytes: 5 %
NEUTROS PCT: 70 %
Neutrophils Absolute: 6.6 10*3/uL (ref 1.4–7.0)
PLATELETS: 325 10*3/uL (ref 150–450)
RBC: 4.22 x10E6/uL (ref 4.14–5.80)
RDW: 14.2 % (ref 12.3–15.4)
WBC: 9.4 10*3/uL (ref 3.4–10.8)

## 2018-04-29 LAB — COMPREHENSIVE METABOLIC PANEL
ALT: 16 IU/L (ref 0–44)
AST: 23 IU/L (ref 0–40)
Albumin/Globulin Ratio: 1.6 (ref 1.2–2.2)
Albumin: 4.2 g/dL (ref 3.5–4.8)
Alkaline Phosphatase: 118 IU/L — ABNORMAL HIGH (ref 39–117)
BUN / CREAT RATIO: 17 (ref 10–24)
BUN: 39 mg/dL — AB (ref 8–27)
Bilirubin Total: <0.2 mg/dL (ref 0.0–1.2)
CALCIUM: 9.2 mg/dL (ref 8.6–10.2)
CO2: 21 mmol/L (ref 20–29)
CREATININE: 2.33 mg/dL — AB (ref 0.76–1.27)
Chloride: 104 mmol/L (ref 96–106)
GFR calc non Af Amer: 26 mL/min/{1.73_m2} — ABNORMAL LOW (ref >59)
GFR, EST AFRICAN AMERICAN: 30 mL/min/{1.73_m2} — AB (ref >59)
GLOBULIN, TOTAL: 2.7 g/dL (ref 1.5–4.5)
Glucose: 341 mg/dL — ABNORMAL HIGH (ref 65–99)
Potassium: 6.1 mmol/L — ABNORMAL HIGH (ref 3.5–5.2)
SODIUM: 138 mmol/L (ref 134–144)
Total Protein: 6.9 g/dL (ref 6.0–8.5)

## 2018-04-29 LAB — LIPID PANEL
CHOL/HDL RATIO: 2.4 ratio (ref 0.0–5.0)
Cholesterol, Total: 132 mg/dL (ref 100–199)
HDL: 56 mg/dL (ref >39)
LDL Calculated: 54 mg/dL (ref 0–99)
Triglycerides: 111 mg/dL (ref 0–149)
VLDL Cholesterol Cal: 22 mg/dL (ref 5–40)

## 2018-04-29 MED ORDER — SODIUM POLYSTYRENE SULFONATE 15 GM/60ML PO SUSP: 30.0000 g | Freq: Once | ORAL | 0 refills | Status: AC

## 2018-04-30 ENCOUNTER — Telehealth (INDEPENDENT_AMBULATORY_CARE_PROVIDER_SITE_OTHER): Payer: Self-pay

## 2018-04-30 NOTE — Telephone Encounter (Signed)
Wells Guiles at [facility] is aware that patients potassium is elevated, kayexalate sent to [place] to help lower potassium. Anemia mildly improved, CKD stable. [name], CMA

## 2018-04-30 NOTE — Telephone Encounter (Signed)
-----   Message from [name], PA-C sent at 04/29/2018 12:04 PM EST ----- Potassium elevated. I have sent Kayexalate to [place] to help lower potassium. Anemia mildly improved. CKD stable.

## 2018-05-26 ENCOUNTER — Other Ambulatory Visit (INDEPENDENT_AMBULATORY_CARE_PROVIDER_SITE_OTHER): Payer: Self-pay | Admitting: Physician Assistant

## 2018-05-26 DIAGNOSIS — H548 Legal blindness, as defined in USA: Secondary | ICD-10-CM

## 2018-05-26 DIAGNOSIS — N183 Chronic kidney disease, stage 3 unspecified: Secondary | ICD-10-CM

## 2018-05-26 DIAGNOSIS — D631 Anemia in chronic kidney disease: Secondary | ICD-10-CM

## 2018-05-26 DIAGNOSIS — D638 Anemia in other chronic diseases classified elsewhere: Secondary | ICD-10-CM

## 2018-06-08 ENCOUNTER — Ambulatory Visit: Payer: Medicare Other | Admitting: Adult Health

## 2018-06-08 ENCOUNTER — Telehealth: Payer: Self-pay

## 2018-06-08 NOTE — Telephone Encounter (Signed)
Patient was a no call/no show for their appointment today.   

## 2018-06-09 ENCOUNTER — Encounter: Payer: Self-pay | Admitting: Adult Health

## 2018-06-23 ENCOUNTER — Encounter (INDEPENDENT_AMBULATORY_CARE_PROVIDER_SITE_OTHER): Payer: Self-pay | Admitting: Primary Care

## 2018-06-23 ENCOUNTER — Other Ambulatory Visit: Payer: Self-pay

## 2018-06-23 ENCOUNTER — Ambulatory Visit (INDEPENDENT_AMBULATORY_CARE_PROVIDER_SITE_OTHER): Payer: Medicare Other | Admitting: Primary Care

## 2018-06-23 VITALS — BP 135/63 | HR 65 | Temp 97.6°F | Resp 20 | Wt 108.0 lb

## 2018-06-23 DIAGNOSIS — H547 Unspecified visual loss: Secondary | ICD-10-CM

## 2018-06-23 DIAGNOSIS — G459 Transient cerebral ischemic attack, unspecified: Secondary | ICD-10-CM

## 2018-06-23 DIAGNOSIS — Z8673 Personal history of transient ischemic attack (TIA), and cerebral infarction without residual deficits: Secondary | ICD-10-CM | POA: Diagnosis not present

## 2018-06-23 DIAGNOSIS — I10 Essential (primary) hypertension: Secondary | ICD-10-CM

## 2018-06-23 DIAGNOSIS — Z760 Encounter for issue of repeat prescription: Secondary | ICD-10-CM

## 2018-06-23 MED ORDER — HYDRALAZINE HCL 50 MG PO TABS: 50.0000 mg | ORAL_TABLET | Freq: Three times a day (TID) | ORAL | 1 refills | Status: DC

## 2018-06-23 MED ORDER — ISOSORBIDE MONONITRATE ER 30 MG PO TB24: 30.0000 mg | ORAL_TABLET | Freq: Every day | ORAL | 1 refills | Status: DC

## 2018-06-23 MED ORDER — EMULSIFIED OMEGA-3 712-1024 MG/15ML PO LIQD: 15.0000 mL | Freq: Every day | ORAL | 5 refills | Status: DC

## 2018-06-23 MED ORDER — ASPIRIN 81 MG PO CHEW: 81.0000 mg | CHEWABLE_TABLET | Freq: Every day | ORAL | 1 refills | Status: DC

## 2018-06-23 MED ORDER — CARVEDILOL 6.25 MG PO TABS: 6.2500 mg | ORAL_TABLET | Freq: Two times a day (BID) | ORAL | 1 refills | Status: DC

## 2018-06-23 MED ORDER — AMLODIPINE BESYLATE 10 MG PO TABS: 10.0000 mg | ORAL_TABLET | Freq: Every day | ORAL | 1 refills | Status: DC

## 2018-06-23 NOTE — Assessment & Plan Note (Deleted)
Bp has improved with medication adjustment  Continue on all current antihypertensive medications

## 2018-06-23 NOTE — Patient Instructions (Signed)

## 2018-06-23 NOTE — Progress Notes (Signed)
Follow up HTN Pt accompanied by caregiver from SNF: [facility]. He is also accompanied by the interpreter.

## 2018-06-23 NOTE — Assessment & Plan Note (Signed)
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

## 2018-06-23 NOTE — Progress Notes (Signed)
Subjective:  Patient ID: Timothy [name], male    DOB: 06-Dec-1940  Age: 78 y.o. MRN: [id number]  CC: Hypertension   HPI Timothy [name] presents for Bp f/u after medication change. Bp has improves no new changes made.  Outpatient Medications Prior to Visit  Medication Sig Dispense Refill  . amLODipine (NORVASC) 10 MG tablet Take 1 tablet (10 mg total) by mouth daily. 90 tablet 1  . aspirin 81 MG chewable tablet Chew 1 tablet (81 mg total) by mouth daily. 90 tablet 1  . carvedilol (COREG) 6.25 MG tablet Take 1 tablet (6.25 mg total) by mouth 2 (two) times daily with a meal. 180 tablet 1  . dorzolamide-timolol (COSOPT) 22.3-6.8 MG/ML ophthalmic solution Place 1 drop into the right eye 2 (two) times daily.    . folic acid (FOLVITE) 1 MG tablet Take 1 tablet (1 mg total) by mouth daily. 90 tablet 1  . hydrALAZINE (APRESOLINE) 50 MG tablet Take 1 tablet (50 mg total) by mouth 3 (three) times daily. 270 tablet 1  . isosorbide mononitrate (IMDUR) 30 MG 24 hr tablet Take 1 tablet (30 mg total) by mouth at bedtime. 90 tablet 1  . Omega-3 Fatty Acids (EMULSIFIED OMEGA-3) [phone] MG/15ML LIQD Take 15 mLs by mouth daily. 355 mL 5  . rosuvastatin (CRESTOR) 5 MG tablet Take 1 tablet (5 mg total) by mouth every evening. 90 tablet 1  . terazosin (HYTRIN) 5 MG capsule Take 1 capsule (5 mg total) by mouth at bedtime. 90 capsule 1  . Difluprednate (DUREZOL) 0.05 % EMUL Place 1 drop into the right eye 4 (four) times daily.    Marland Kitchen ketorolac (ACULAR) 0.4 % SOLN Place 1 drop into the right eye 4 (four) times daily.    . prednisoLONE acetate (PRED FORTE) 1 % ophthalmic suspension Place 1 drop into the right eye 4 (four) times daily.     No facility-administered medications prior to visit.     ROS Review of Systems  Constitutional: Negative.   HENT: Positive for dental problem.   Eyes: Positive for visual disturbance.  Respiratory: Negative.   Cardiovascular: Negative.   Gastrointestinal: Negative.    Musculoskeletal: Positive for gait problem.  Skin: Negative.   Psychiatric/Behavioral: Negative.     Objective:  BP 135/63 (BP Location: Left Arm, Cuff Size: Normal)   Pulse 65   Temp 97.6 F (36.4 C) (Oral)   Resp 20   Wt 108 lb (49 kg)   SpO2 99%   BMI 19.75 kg/m   BP/Weight 06/23/2018 04/28/2018 66/10/3014  Systolic BP 010 932 -  Diastolic BP 63 69 -  Wt. (Lbs) 108 101.8 103.6  BMI 19.75 18.62 18.95    Hypertension  Timothy [name] is compliance with low sodium diet.  He is compliant with medications. He is not compliant with exercise. Exercise: The patient does not participate in regular exercise at present..  Lab Data BMP Latest Ref Rng & Units 04/28/2018 02/26/2018 02/22/2018  Glucose 65 - 99 mg/dL 341(H) 263(H) 152(H)  BUN 8 - 27 mg/dL 39(H) 34(H) 30(H)  Creatinine 0.76 - 1.27 mg/dL 2.33(H) 2.82(H) 2.33(H)  BUN/Creat Ratio 10 - 24 17 12  -  Sodium 134 - 144 mmol/L 138 138 141  Potassium 3.5 - 5.2 mmol/L 6.1(H) 5.4(H) 4.8  Chloride 96 - 106 mmol/L 104 102 112(H)  CO2 20 - 29 mmol/L 21 21 23   Calcium 8.6 - 10.2 mg/dL 9.2 9.1 8.5(L)     Physical Exam HENT:     Head: Normocephalic.  Right Ear: Ear canal normal.     Left Ear: Ear canal normal.     Nose: Nose normal.     Mouth/Throat:     Mouth: Mucous membranes are moist.  Eyes:     Comments: Non reactive and light and ROM partially blind  Neck:     Musculoskeletal: Normal range of motion.  Cardiovascular:     Rate and Rhythm: Normal rate and regular rhythm.  Pulmonary:     Effort: Pulmonary effort is normal.     Breath sounds: Normal breath sounds.  Abdominal:     General: Abdomen is flat.  Musculoskeletal:        General: Deformity present.  Skin:    General: Skin is warm.  Neurological:     Mental Status: He is alert and oriented to person, place, and time.  Psychiatric:        Mood and Affect: Mood normal.     Timothy [name] was seen today for hypertension.  Diagnoses and all orders for this  visit:  TIA (transient ischemic attack)  Hypertension, unspecified type  Poor vision    Timothy [name] was seen today for hypertension.  Diagnoses and all orders for this visit:  TIA (transient ischemic attack)  Hypertension, unspecified type  Poor vision    There are no diagnoses linked to this encounter.  Follow-up:  6months Kerin Perna NP   Subjective:    Patient here for follow-up of elevated blood pressure.  He is not exercising and is adherent to a low-salt diet.  Blood pressure is well controlled at home. Cardiac symptoms: none. Patient denies: chest pressure/discomfort. Cardiovascular risk factors: hypertension. History of target organ damage: chronic kidney disease.  The following portions of the patient's history were reviewed and updated as appropriate: He  has a past medical history of Alcohol abuse, Anemia, Bilateral cataracts (04/08/2017), Dementia ([place]), DM (diabetes mellitus), type 2 with renal complications ([place]), HOH (hard of hearing), Hypertension, Legally blind, and Stroke ([place]) (01/2018)..    Objective:     Assessment:    Hypertension, stage 1  . Evidence of target organ damage: chronic kidney disease.    Plan:    Dietary sodium restriction. cont current tBp med

## 2018-07-12 ENCOUNTER — Ambulatory Visit (INDEPENDENT_AMBULATORY_CARE_PROVIDER_SITE_OTHER): Payer: Medicare Other | Admitting: Adult Health

## 2018-07-12 ENCOUNTER — Encounter: Payer: Self-pay | Admitting: Adult Health

## 2018-07-12 VITALS — BP 129/60 | HR 64 | Wt 109.0 lb

## 2018-07-12 DIAGNOSIS — I10 Essential (primary) hypertension: Secondary | ICD-10-CM

## 2018-07-12 DIAGNOSIS — E785 Hyperlipidemia, unspecified: Secondary | ICD-10-CM | POA: Diagnosis not present

## 2018-07-12 DIAGNOSIS — E1121 Type 2 diabetes mellitus with diabetic nephropathy: Secondary | ICD-10-CM | POA: Diagnosis not present

## 2018-07-12 DIAGNOSIS — I63442 Cerebral infarction due to embolism of left cerebellar artery: Secondary | ICD-10-CM

## 2018-07-12 NOTE — Progress Notes (Signed)
[facility] [address] [place] [place] [address] [phone]       OFFICE FOLLOW UP NOTE  [name] Date of Birth:  1941-03-30 Medical Record Number:  [id number]   Reason for Referral:  hospital stroke follow up  CHIEF COMPLAINT:  Chief Complaint  Patient presents with  . Follow-up    Stroke follow up room 9 pt with interpreter Mike Gip is the language,pt with Teresa(staff)   works at [employer] pt has a cane     HPI: 07/12/18 VISIT [name] is being seen today for follow-up visit regarding stroke in 01/2018 and is accompanied by ALF staff and interpreter.  He continues to reside at [facility].  He has been stable from a stroke standpoint without new or worsening stroke/TIA symptoms.  Underlying history of legal blindness with cataracts and dementia.  He is unable to state whether his vision is worse at this time but facility aide does state that they are working him up for potential eye surgery.  Per aide, memory has been stable.  He remains in a high functioning level with use of his cane despite memory impairments.  Continues on aspirin without side effects of bleeding or bruising and Crestor without side effects myalgias.  Blood pressure today satisfactory at 129/60.  Denies new or worsening stroke/TIA symptoms.     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 History obtained from patient, caretaker, interpreter and chart review. Reviewed all radiology images and labs personally.  [name] is a 78 y.o. male with history of ETOH use, cataracts., DB, HTN, HLD, HOH, blind, dementia who presented with altered mental status and worsening vision complaints.  CT had reviewed and showed left cerebellar infarct along with left frontal lobe infarct with indeterminate age.  MRI brain reviewed and showed patchy left cerebellar  convexities and corona radiata infarcts along with old left PICA infarcts.  MRA negative for acute findings.  Carotid Doppler showed bilateral ICA stenosis of 1 to 39%.  2D echo showed an EF of 55 to 60% without evidence of PFO or cardiac embolism.  It was felt as though recent infarcts were likely embolic but due to baseline of dementia and blindness, he was seen to not be at a good anticoagulation candidate therefore no further testing was recommended to assess for embolic source.  LDL 58 and recommended continuation of Lipitor.  A1c 7.1 and recommended continued follow-up and management by PCP.  HTN elevated during admission but stable and recommended long-term BP goal normotensive range.  Recommended DAPT for 3 weeks and aspirin alone.  Per notes, patient was agitated during admission that required physical and chemical restraints most likely related to hospital delirium.  Recommended to return to prior SNF once stabilized and was discharged in stable condition.  Patient return to ED on 02/20/2018 from ALF due to staff observing facial droop and left arm weakness.  Imaging did not show show acute stroke.  Diagnosed with possible TIA as symptoms resolved and was discharged back to facility.  Patient is being seen today for hospital follow up and is accompanied by [place] [facility] interpreter. Patient does have a baseline of being legally blind and unable to determine if he continues to have worsening vision since his prior stroke. He is completely blind in right eye but able to see shadows from his left eye. Completed 3 weeks  of DAPT and currently on aspirin alone without side effects of bleeding or bruising. Continues to take Crestor without side effects of myalgias. Blood pressure satisfactory at 135/56. He continues to reside at [facility] which he has been a long term resident there prior to his stroke. He denies any complaints with his memory at this time. No further concerns at this time. Denies new or  worsening stroke/TIA symptoms.   ROS:   14 system review of systems performed and negative with exception of eye redness, light sensitivity, loss of vision, eye pain, blurred vision, hearing loss, cough and facial drooping  PMH:  Past Medical History:  Diagnosis Date  . Alcohol abuse    Patient denies, but was shared with people from [facility] by his male roommate.  . Anemia    chronic disease/renal disease  . Bilateral cataracts 04/08/2017  . Dementia ([place])   . DM (diabetes mellitus), type 2 with renal complications ([place])    uncertain when diagnosed  . HOH (hard of hearing)   . Hypertension   . Legally blind    B/L  . Stroke [facility]) 01/9832   Embolic    PSH:  Past Surgical History:  Procedure Laterality Date  . MEMBRANE PEEL Right 09/29/2017   Procedure: MEMBRANE PEEL WITH AIR GAS;  Surgeon: Jalene Mullet, MD;  Location: [place];  Service: Ophthalmology;  Laterality: Right;  . PARS PLANA VITRECTOMY Left 06/29/2017   Procedure: PARS PLANA VITRECTOMY WITH 25 GAUGE AND MEMBRANE PEEL WITH GAS EXCHANGE, AIR SILICONE OIL AND PHACO COAGULATION, LASER;  Surgeon: Jalene Mullet, MD;  Location: [place];  Service: Ophthalmology;  Laterality: Left;  . PARS PLANA VITRECTOMY Right 09/18/2017   Procedure: PARS PLANA VITRECTOMY WITH 25 GAUGE MEMBRANE PILL WITH AIR GAS SILICONE OIL AND PHOTOCOAGULATION;  Surgeon: Jalene Mullet, MD;  Location: [place];  Service: Ophthalmology;  Laterality: Right;  . PARS PLANA VITRECTOMY Right 09/29/2017   Procedure: PARS PLANA VITRECTOMY WITH 25 GAUGE;  Surgeon: Jalene Mullet, MD;  Location: [place];  Service: Ophthalmology;  Laterality: Right;  . PHOTOCOAGULATION Right 09/29/2017   Procedure: PHOTOCOAGULATION;  Surgeon: Jalene Mullet, MD;  Location: [place];  Service: Ophthalmology;  Laterality: Right;  . 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 Marital status: Married    Spouse name: Not on file  . Number of children: 4  . Years of education: 0  . Highest education level: Not on file  Occupational History  . Occupation: unemployed  Social Needs  . Financial resource strain: Not on file  . Food insecurity:    Worry: Not on file    Inability: Not on file  . Transportation needs:    Medical: Not on file    Non-medical: Not on file  Tobacco Use  . Smoking status: Former Smoker    Types: Cigarettes    Last attempt to quit: 05/19/1990    Years since quitting: 28.1  . Smokeless tobacco: Never Used  Substance and Sexual Activity  . 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 Drug use: No  . Sexual activity: Not on file  Lifestyle  . Physical activity:    Days per week: Not on file  Minutes per session: Not on file  . Stress: Not on file  Relationships  . Social connections:    Talks on phone: Not on file    Gets together: Not on file    Attends religious service: Not on file    Active member of club or organization: Not on file    Attends meetings of clubs or organizations: Not on file    Relationship status: Not on file  . Intimate partner violence:    Fear of current or ex partner: Not on file    Emotionally abused: Not on file    Physically abused: Not on file    Forced sexual activity: Not on file  Other Topics Concern  . Not on file  Social History Narrative   Originally from [place]   [place]   Was in camp in [place] for 3-4 months.   [place] In [place].  Initially in [place].   He came here with his nephew and lived together until the house where he was living was sold, then went separate ways.  Not clear how long ago this was.   Hmuin Rcom, with whom he was recently lived with at [place], stated he  moved from place to place until kicked out until he lived with her for past year. (2018)   Used to work in Consulting civil engineer.      Looking at possibility of [employer] or [employer].  [place] male already living there.      Never attended school.  Does not read or write in his language of [place].      Left [place] as fleeing communists.  Denies any physical injury/torture or imprisonment.   He did fight in the war in [place].     Left his wife behind in their home.                  Family History:  Family History  Problem Relation Age of Onset  . Lung disease Neg Hx     Medications:   Current Outpatient Medications on File Prior to Visit  Medication Sig Dispense Refill  . amLODipine (NORVASC) 10 MG tablet Take 1 tablet (10 mg total) by mouth daily. 90 tablet 1  . aspirin 81 MG chewable tablet Chew 1 tablet (81 mg total) by mouth daily. 90 tablet 1  . carvedilol (COREG) 6.25 MG tablet Take 1 tablet (6.25 mg total) by mouth 2 (two) times daily with a meal. 180 tablet 1  . Difluprednate (DUREZOL) 0.05 % EMUL Place 1 drop into the right eye 4 (four) times daily.    . dorzolamide-timolol (COSOPT) 22.3-6.8 MG/ML ophthalmic solution Place 1 drop into the right eye 2 (two) times daily.    . folic acid (FOLVITE) 1 MG tablet Take 1 tablet (1 mg total) by mouth daily. 90 tablet 1  . hydrALAZINE (APRESOLINE) 50 MG tablet Take 1 tablet (50 mg total) by mouth 3 (three) times daily. 270 tablet 1  . isosorbide mononitrate (IMDUR) 30 MG 24 hr tablet Take 1 tablet (30 mg total) by mouth at bedtime. 90 tablet 1  . ketorolac (ACULAR) 0.4 % SOLN Place 1 drop into the right eye 4 (four) times daily.    . Omega-3 Fatty Acids (EMULSIFIED OMEGA-3) 756-4332 MG/15ML LIQD Take 15 mLs by mouth daily. 355 mL 5  . prednisoLONE acetate (PRED FORTE) 1 % ophthalmic suspension Place 1 drop into the right eye 4 (four) times daily.    . rosuvastatin (  CRESTOR) 5 MG tablet Take 1 tablet (5 mg total) by mouth every evening.  90 tablet 1  . terazosin (HYTRIN) 5 MG capsule Take 1 capsule (5 mg total) by mouth at bedtime. 90 capsule 1   No current facility-administered medications on file prior to visit.     Allergies:  No Known Allergies   Physical Exam  Vitals:   07/12/18 0958  BP: 129/60  Pulse: 64  Weight: 109 lb (49.4 kg)   Body mass index is 19.94 kg/m. No exam data present  General: Pleasant elderly male, seated, in no evident distress Head: head normocephalic and atraumatic.   Neck: supple with no carotid or supraclavicular bruits Cardiovascular: regular rate and rhythm, no murmurs Musculoskeletal: no deformity Skin:  no rash/petichiae Vascular:  Normal pulses all extremities  Neurologic Exam Mental Status: Awake and fully alert. Oriented to place and time per interpreter. Attention span, concentration and fund of knowledge diminished. Mood and affect appropriate and cooperative with exam.  Cranial Nerves:  Pupils fixed with right pupil irregular shape.  Patient legally blind.  Hearing intact. Facial sensation intact. Face, tongue, palate moves normally and symmetrically.  Motor: Normal bulk and tone. Normal strength in all tested extremity muscles. Sensory.: intact to touch , pinprick , position and vibratory sensation.  Coordination: Rapid alternating movements normal in all extremities. Finger-to-nose and heel-to-shin performed accurately bilaterally. Gait and Station: Arises from chair without difficulty. Stance is normal. Gait demonstrates normal stride length and balance with assistance of cane. Reflexes: 1+ and symmetric. Toes downgoing.       Diagnostic Data (Labs, Imaging, Testing)  CT HEAD WO CONTRAST 01/18/2018 IMPRESSION: Large area of low attenuation within the left cerebellar hemisphere most compatible with subacute infarct. Small area of low attenuation within the left frontal lobe most compatible with age-indeterminate infarct.  MR BRAIN WO CONTRAST MR MRA HEAD    01/18/2018 IMPRESSION: 1. Patchy acute infarct along the cerebral convexities and left corona radiata. These are seen in the anterior and posterior circulation and suggest central embolic disease. 2. Remote left PICA infarct. Multiple remote lacunar infarcts. 3. Remote external capsule hemorrhage on the right. 4. Motion degraded intracranial MRA without acute finding.  ECHOCARDIOGRAM 01/20/2018 Study Conclusions - Left ventricle: The cavity size was normal. Systolic function was   normal. The estimated ejection fraction was in the range of 55%   to 60%. Wall motion was normal; there were no regional wall   motion abnormalities. Doppler parameters are consistent with   abnormal left ventricular relaxation (grade 1 diastolic   dysfunction). - Aortic valve: There was mild regurgitation. - Mitral valve: There was trivial regurgitation. - Left atrium: The atrium was moderately dilated. - Atrial septum: No obvious defect or patent foramen ovale was   identified by color flow Doppler. There was an atrial septal   aneurysm. - Tricuspid valve: There was trivial regurgitation. - Pericardium, extracardiac: A trivial pericardial effusion was   identified posterior to the heart.   ASSESSMENT: Braylynn Ghan is a 78 y.o. 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 Vascular risk factors include EtOH use, DM, HTN, HLD and dementia.  He is being seen today for follow-up visit and has been stable from a stroke standpoint without new or worsening stroke/TIA symptoms.    PLAN: -Continue aspirin 81 mg daily  and crestor  for  secondary stroke prevention -F/u with PCP regarding your cholesterol, blood pressure, diabetes management -continue to monitor BP at facility -advised to continue to stay active and maintain a healthy diet -Maintain strict  control of hypertension with blood pressure goal below 130/90, diabetes with hemoglobin A1c goal below 6.5% and cholesterol with LDL cholesterol (bad cholesterol) goal below 70 mg/dL. I also advised the patient to eat a healthy diet with plenty of whole grains, cereals, fruits and vegetables, exercise regularly and maintain ideal body weight.  As he has been stable from a stroke standpoint and is followed closely at facility, he can follow-up in this office as needed regarding his stroke but did advise to call with questions, concerns or need of future follow-up appointment  Greater than 50% of time during this 25 minute visit was spent on counseling,explanation of diagnosis of left cerebellar and coronal radiata infarcts, reviewing risk factor management of DM, HTN, HLD and dementia, planning of further management, discussion with patient and family and coordination of care    Venancio Poisson, AGNP-BC  [facility] [address] [place], [place] [address]  Phone [phone] Fax [phone] Note: This document was prepared with digital dictation and possible smart phrase technology. Any transcriptional errors that result from this process are unintentional.

## 2018-07-12 NOTE — Patient Instructions (Signed)
Overall, stable from stroke standpoint without reported new or worsening stroke/TIA symptoms.  Continue current treatment plan.  Continue aspirin 81 mg daily  and Crestor for secondary stroke prevention  Continue to follow up with PCP regarding cholesterol and blood pressure management along with routine monitoring of lipid panel  Continue to stay active with ambulation of a cane for fall prevention and maintain a healthy diet  Continue to monitor blood pressure routinely  Maintain strict control of hypertension with blood pressure goal below 130/90, diabetes with hemoglobin A1c goal below 6.5% and cholesterol with LDL cholesterol (bad cholesterol) goal below 70 mg/dL. I also advised the patient to eat a healthy diet with plenty of whole grains, cereals, fruits and vegetables, exercise regularly and maintain ideal body weight.  As he is closely monitored at current facility and has been stable from a stroke standpoint, he will be recommended to follow-up as needed but please call with any questions, concerns or need of future follow-up appointment regarding his stroke       Thank you for coming to see [name] at [facility]. I hope we have been able to provide you high quality care today.  You may receive a patient satisfaction survey over the next few weeks. We would appreciate your feedback and comments so that we may continue to improve ourselves and the health of our patients.

## 2018-07-12 NOTE — Progress Notes (Signed)
I agree with the above plan 

## 2018-09-22 ENCOUNTER — Telehealth: Payer: Self-pay | Admitting: *Deleted

## 2018-09-22 ENCOUNTER — Ambulatory Visit: Payer: Medicare Other | Admitting: Primary Care

## 2018-09-22 ENCOUNTER — Other Ambulatory Visit: Payer: Self-pay

## 2018-09-22 NOTE — Telephone Encounter (Signed)
MA spoke with Timothy [name] The care director and she reports that pateint receives his medications daily. BP's are not checked daily, they are checked at random according to the director. Patient had BP's that ranged from 120/55 to 150/80 in the month of April. No reports for May have been collected.  Timothy [name] reports "he's been doing good though". MA informed director of information being forwarded to the PCP and a follow up phone call being made if warranted.

## 2019-02-14 IMAGING — CT CT HEAD W/O CM
4 series · 15 of 47 positions shown, 17 images · non-contrast
Comparison: Head CT and brain MRI, 01/18/2018.

CLINICAL DATA: Left sided facial droop with altered mental status.
Last seen normal sometime yesterday.

EXAM:
CT HEAD WITHOUT CONTRAST
TECHNIQUE: Contiguous axial images were obtained from the base of the skull
through the vertex without intravenous contrast.

[Series 3: head wo · axial · 0.43mm/px · z∈[-87,+28]mm · 7 of 31 slices shown, 9 images]
[im 4/31  brain]
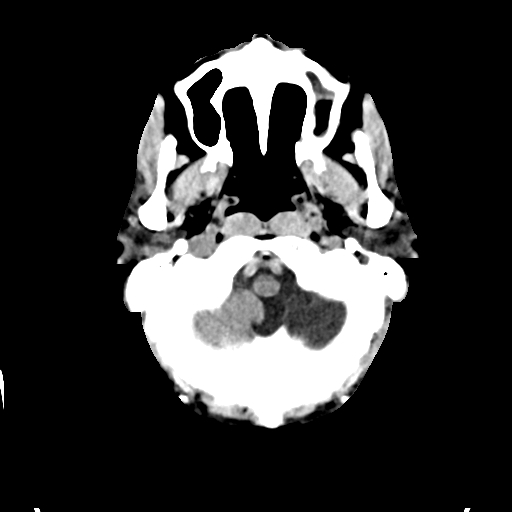
[im 4/31  bone]
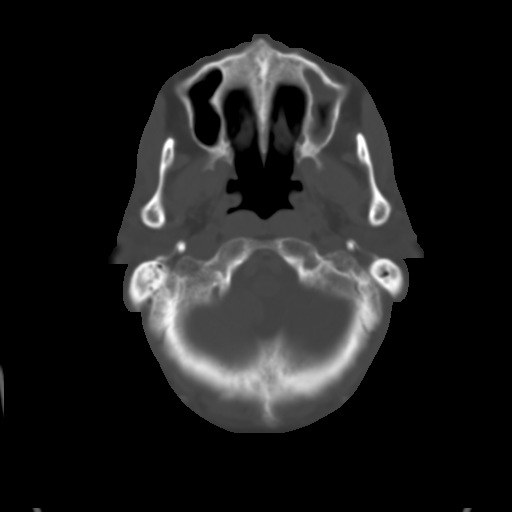
[im 8/31  brain]
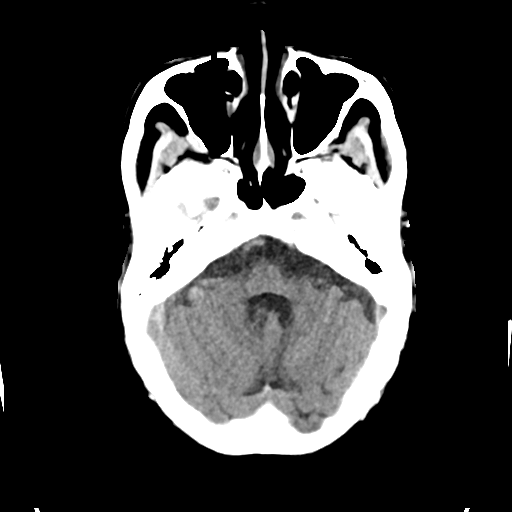
[im 12/31  brain]
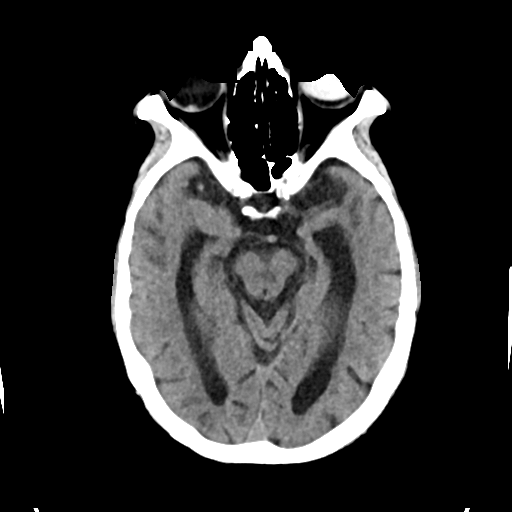
[im 16/31  brain]
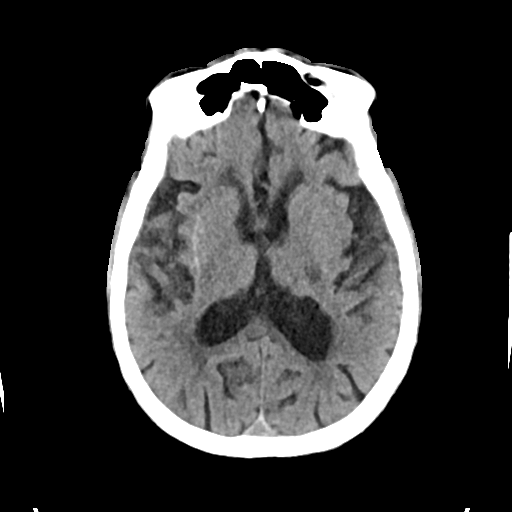
[im 19/31  brain]
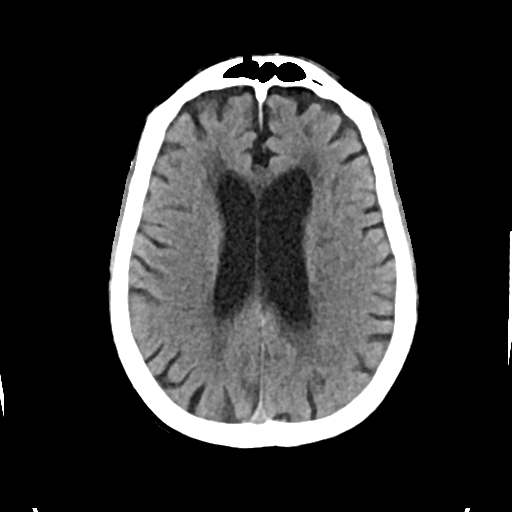
[im 19/31  bone]
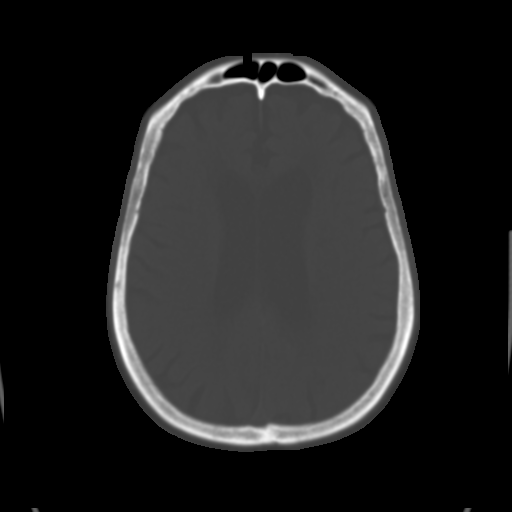
[im 23/31  brain]
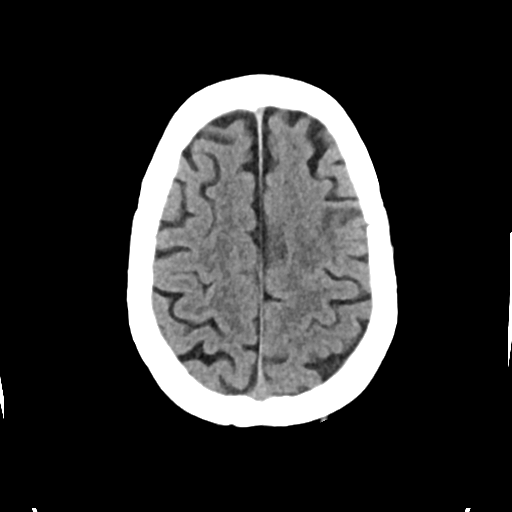
[im 27/31  brain]
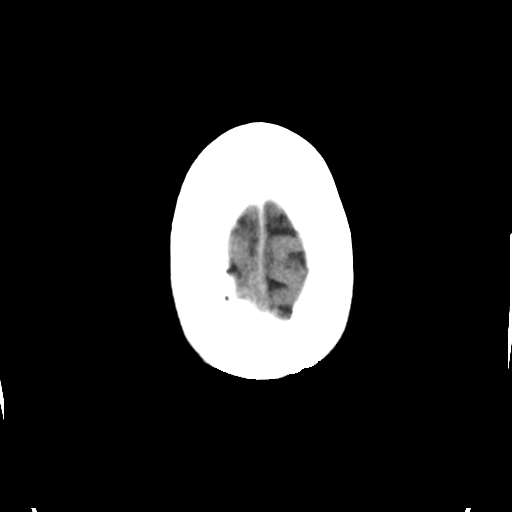

[Series 4: head bone · axial · 0.43mm/px · z∈[-88,-72]mm · 2 of 76 slices shown]
[im 8/76  bone]
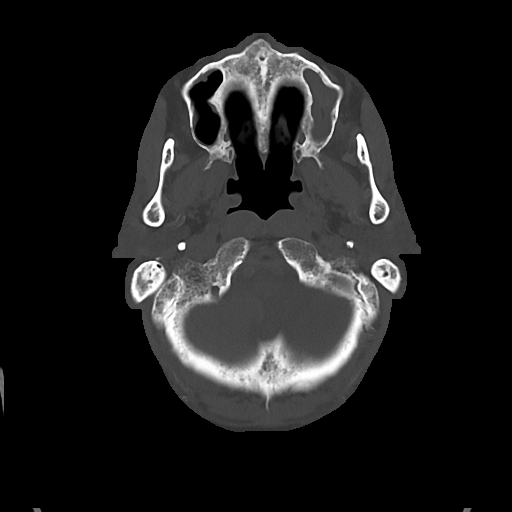
[im 16/76  bone]
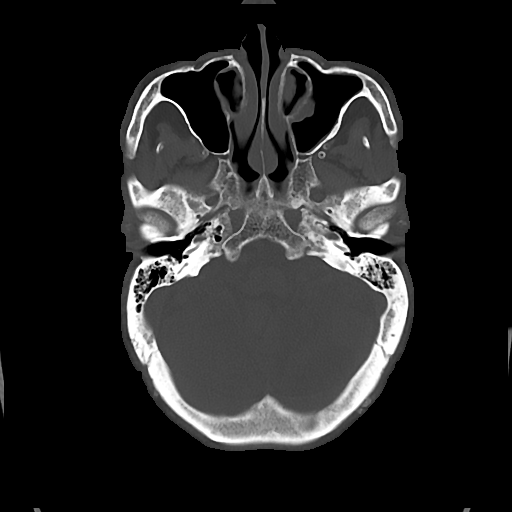

[Series 5: cor soft · coronal · 0.32mm/px · 3 of 60 slices shown]
[im 20/60  brain]
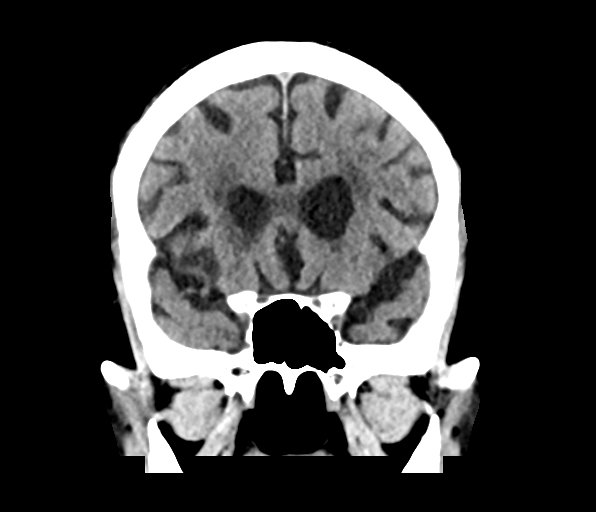
[im 27/60  brain]
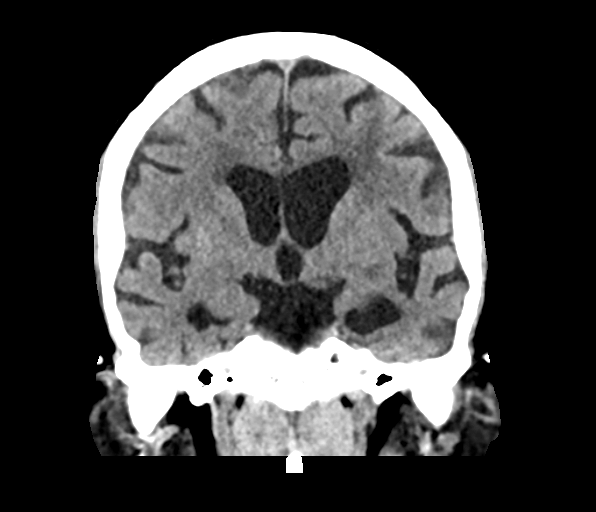
[im 33/60  brain]
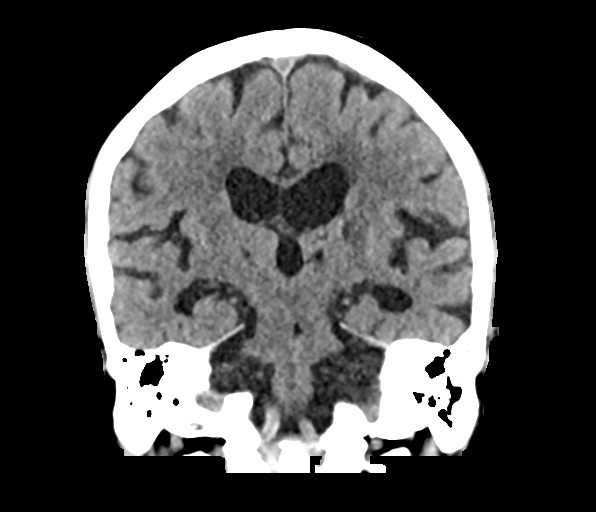

[Series 6: sag soft · sagittal · 0.32mm/px · 3 of 49 slices shown]
[im 17/49  brain]
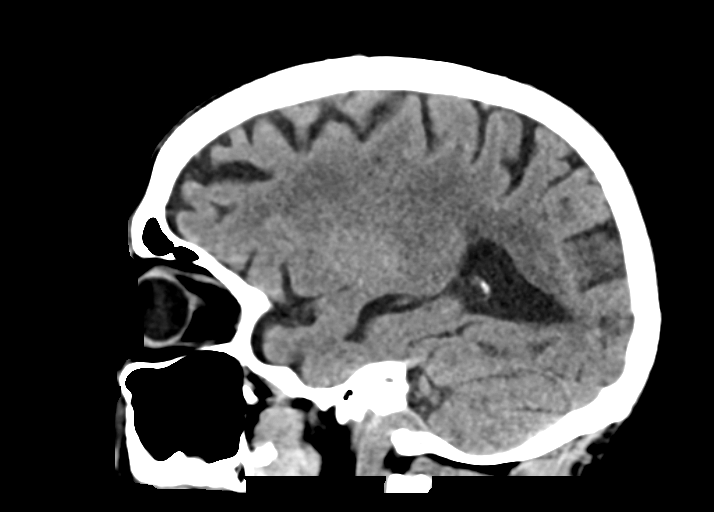
[im 25/49  brain]
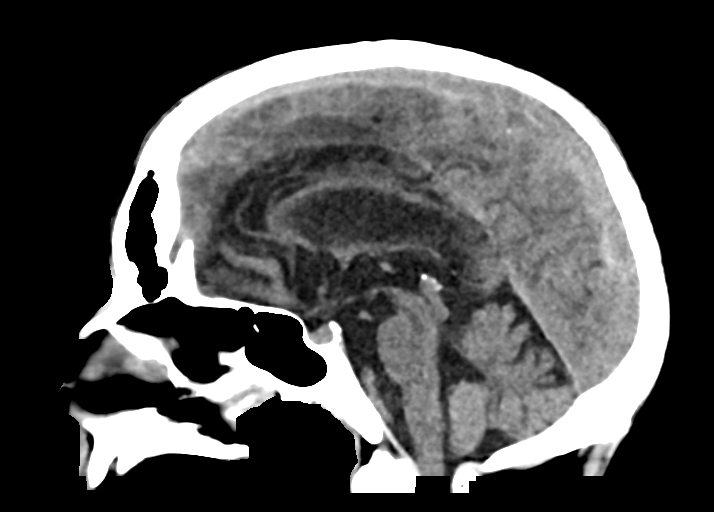
[im 33/49  brain]
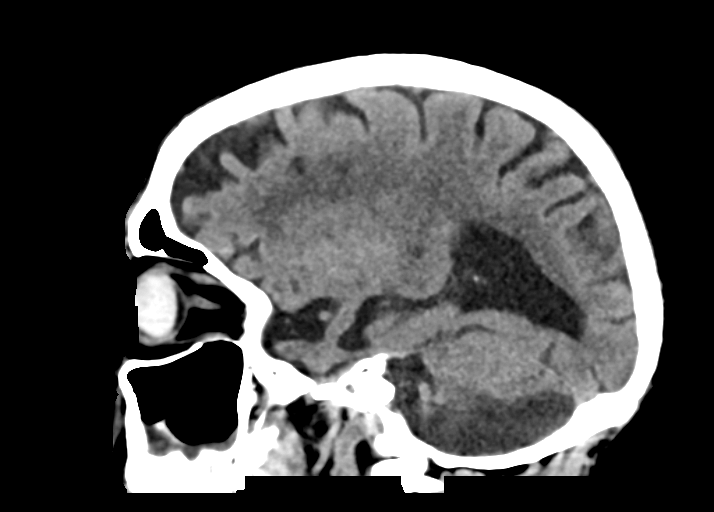

[15 of 47 positions shown; findings below may reference images not displayed]

FINDINGS: Brain: No evidence of acute infarction, hemorrhage, hydrocephalus,
extra-axial collection or mass lesion/mass effect.

Old left PICA distribution cerebellar infarct, small old left
lateral frontal lobe infarct, small lacunar infarct along the
posterior limb of the left internal capsule with additional small
thalamic lacunar infarcts bilaterally. There is ventricular and
sulcal enlargement reflecting mild to moderate atrophy. Other patchy
areas of hypoattenuation in the white matter consistent with mild
chronic microvascular ischemic change.

Vascular: No hyperdense vessel or unexpected calcification.

Skull: Normal. Negative for fracture or focal lesion.

Sinuses/Orbits: Prosthetic left globe, stable. No acute globe or
orbital abnormality. Mild left maxillary sinus mucosal thickening.

Other: None.
IMPRESSION: 1. No acute intracranial abnormalities.
2. Multiple old infarcts, mild to moderate atrophy and mild chronic
microvascular ischemic change.

## 2019-02-22 ENCOUNTER — Telehealth: Payer: Self-pay

## 2019-02-22 NOTE — Telephone Encounter (Signed)
Call placed to Wheeler # [phone] and informed her that the signed FL2 has been faxed to her from [facility].

## 2020-03-07 ENCOUNTER — Ambulatory Visit (INDEPENDENT_AMBULATORY_CARE_PROVIDER_SITE_OTHER): Payer: Medicare Other | Admitting: Primary Care

## 2020-06-16 ENCOUNTER — Inpatient Hospital Stay (HOSPITAL_COMMUNITY)
Admission: EM | Admit: 2020-06-16 | Discharge: 2020-06-29 | DRG: 640 | Disposition: A | Discharge status: Hospice | Payer: Medicare Other | Source: Skilled Nursing Facility | Attending: Internal Medicine | Admitting: Internal Medicine

## 2020-06-16 ENCOUNTER — Encounter (HOSPITAL_COMMUNITY): Payer: Self-pay

## 2020-06-16 ENCOUNTER — Observation Stay (HOSPITAL_COMMUNITY): Payer: Medicare Other

## 2020-06-16 ENCOUNTER — Emergency Department (HOSPITAL_COMMUNITY): Payer: Medicare Other

## 2020-06-16 ENCOUNTER — Other Ambulatory Visit: Payer: Self-pay

## 2020-06-16 DIAGNOSIS — Z7982 Long term (current) use of aspirin: Secondary | ICD-10-CM

## 2020-06-16 DIAGNOSIS — I10 Essential (primary) hypertension: Secondary | ICD-10-CM | POA: Diagnosis present

## 2020-06-16 DIAGNOSIS — R4182 Altered mental status, unspecified: Secondary | ICD-10-CM

## 2020-06-16 DIAGNOSIS — N19 Unspecified kidney failure: Secondary | ICD-10-CM

## 2020-06-16 DIAGNOSIS — E1129 Type 2 diabetes mellitus with other diabetic kidney complication: Secondary | ICD-10-CM | POA: Diagnosis present

## 2020-06-16 DIAGNOSIS — N184 Chronic kidney disease, stage 4 (severe): Secondary | ICD-10-CM | POA: Diagnosis present

## 2020-06-16 DIAGNOSIS — Z515 Encounter for palliative care: Secondary | ICD-10-CM

## 2020-06-16 DIAGNOSIS — N179 Acute kidney failure, unspecified: Secondary | ICD-10-CM | POA: Diagnosis present

## 2020-06-16 DIAGNOSIS — Z66 Do not resuscitate: Secondary | ICD-10-CM | POA: Diagnosis present

## 2020-06-16 DIAGNOSIS — E870 Hyperosmolality and hypernatremia: Principal | ICD-10-CM

## 2020-06-16 DIAGNOSIS — E785 Hyperlipidemia, unspecified: Secondary | ICD-10-CM | POA: Diagnosis present

## 2020-06-16 DIAGNOSIS — Z20822 Contact with and (suspected) exposure to covid-19: Secondary | ICD-10-CM | POA: Diagnosis present

## 2020-06-16 DIAGNOSIS — E8809 Other disorders of plasma-protein metabolism, not elsewhere classified: Secondary | ICD-10-CM | POA: Diagnosis present

## 2020-06-16 DIAGNOSIS — R64 Cachexia: Secondary | ICD-10-CM | POA: Diagnosis present

## 2020-06-16 DIAGNOSIS — E860 Dehydration: Secondary | ICD-10-CM | POA: Diagnosis present

## 2020-06-16 DIAGNOSIS — D72829 Elevated white blood cell count, unspecified: Secondary | ICD-10-CM | POA: Diagnosis present

## 2020-06-16 DIAGNOSIS — G9341 Metabolic encephalopathy: Secondary | ICD-10-CM | POA: Diagnosis not present

## 2020-06-16 DIAGNOSIS — Z87891 Personal history of nicotine dependence: Secondary | ICD-10-CM

## 2020-06-16 DIAGNOSIS — Z79899 Other long term (current) drug therapy: Secondary | ICD-10-CM

## 2020-06-16 DIAGNOSIS — E872 Acidosis: Secondary | ICD-10-CM | POA: Diagnosis present

## 2020-06-16 DIAGNOSIS — I129 Hypertensive chronic kidney disease with stage 1 through stage 4 chronic kidney disease, or unspecified chronic kidney disease: Secondary | ICD-10-CM | POA: Diagnosis present

## 2020-06-16 DIAGNOSIS — D631 Anemia in chronic kidney disease: Secondary | ICD-10-CM | POA: Diagnosis present

## 2020-06-16 DIAGNOSIS — E43 Unspecified severe protein-calorie malnutrition: Secondary | ICD-10-CM | POA: Diagnosis present

## 2020-06-16 DIAGNOSIS — E1122 Type 2 diabetes mellitus with diabetic chronic kidney disease: Secondary | ICD-10-CM | POA: Diagnosis present

## 2020-06-16 DIAGNOSIS — H548 Legal blindness, as defined in USA: Secondary | ICD-10-CM

## 2020-06-16 DIAGNOSIS — R54 Age-related physical debility: Secondary | ICD-10-CM | POA: Diagnosis present

## 2020-06-16 DIAGNOSIS — Z681 Body mass index (BMI) 19 or less, adult: Secondary | ICD-10-CM

## 2020-06-16 DIAGNOSIS — F0150 Vascular dementia without behavioral disturbance: Secondary | ICD-10-CM | POA: Diagnosis present

## 2020-06-16 DIAGNOSIS — S069X9S Unspecified intracranial injury with loss of consciousness of unspecified duration, sequela: Secondary | ICD-10-CM

## 2020-06-16 DIAGNOSIS — Z8673 Personal history of transient ischemic attack (TIA), and cerebral infarction without residual deficits: Secondary | ICD-10-CM

## 2020-06-16 DIAGNOSIS — Z87820 Personal history of traumatic brain injury: Secondary | ICD-10-CM

## 2020-06-16 DIAGNOSIS — F1010 Alcohol abuse, uncomplicated: Secondary | ICD-10-CM | POA: Diagnosis present

## 2020-06-16 DIAGNOSIS — D638 Anemia in other chronic diseases classified elsewhere: Secondary | ICD-10-CM | POA: Diagnosis present

## 2020-06-16 DIAGNOSIS — E11319 Type 2 diabetes mellitus with unspecified diabetic retinopathy without macular edema: Secondary | ICD-10-CM | POA: Diagnosis present

## 2020-06-16 DIAGNOSIS — R339 Retention of urine, unspecified: Secondary | ICD-10-CM | POA: Diagnosis present

## 2020-06-16 DIAGNOSIS — N189 Chronic kidney disease, unspecified: Secondary | ICD-10-CM | POA: Diagnosis present

## 2020-06-16 DIAGNOSIS — F0390 Unspecified dementia without behavioral disturbance: Secondary | ICD-10-CM | POA: Diagnosis present

## 2020-06-16 LAB — I-STAT VENOUS BLOOD GAS, ED
Acid-Base Excess: 1 mmol/L (ref 0.0–2.0)
Bicarbonate: 27.3 mmol/L (ref 20.0–28.0)
Calcium, Ion: 1.09 mmol/L — ABNORMAL LOW (ref 1.15–1.40)
HCT: 25 % — ABNORMAL LOW (ref 39.0–52.0)
Hemoglobin: 8.5 g/dL — ABNORMAL LOW (ref 13.0–17.0)
O2 Saturation: 99 %
Potassium: 4.6 mmol/L (ref 3.5–5.1)
Sodium: 159 mmol/L — ABNORMAL HIGH (ref 135–145)
TCO2: 29 mmol/L (ref 22–32)
pCO2, Ven: 54 mmHg (ref 44.0–60.0)
pH, Ven: 7.312 (ref 7.250–7.430)
pO2, Ven: 145 mmHg — ABNORMAL HIGH (ref 32.0–45.0)

## 2020-06-16 LAB — COMPREHENSIVE METABOLIC PANEL
ALT: 7 U/L (ref 0–44)
AST: 12 U/L — ABNORMAL LOW (ref 15–41)
Albumin: 2.8 g/dL — ABNORMAL LOW (ref 3.5–5.0)
Alkaline Phosphatase: 81 U/L (ref 38–126)
Anion gap: 13 (ref 5–15)
BUN: 136 mg/dL — ABNORMAL HIGH (ref 8–23)
CO2: 24 mmol/L (ref 22–32)
Calcium: 8.1 mg/dL — ABNORMAL LOW (ref 8.9–10.3)
Chloride: 120 mmol/L — ABNORMAL HIGH (ref 98–111)
Creatinine, Ser: 7.88 mg/dL — ABNORMAL HIGH (ref 0.61–1.24)
GFR, Estimated: 6 mL/min — ABNORMAL LOW (ref >60)
Glucose, Bld: 274 mg/dL — ABNORMAL HIGH (ref 70–99)
Potassium: 4.9 mmol/L (ref 3.5–5.1)
Sodium: 157 mmol/L — ABNORMAL HIGH (ref 135–145)
Total Bilirubin: 0.4 mg/dL (ref 0.3–1.2)
Total Protein: 6.5 g/dL (ref 6.5–8.1)

## 2020-06-16 LAB — CBC
HCT: 27.8 % — ABNORMAL LOW (ref 39.0–52.0)
Hemoglobin: 8.4 g/dL — ABNORMAL LOW (ref 13.0–17.0)
MCH: 25.3 pg — ABNORMAL LOW (ref 26.0–34.0)
MCHC: 30.2 g/dL (ref 30.0–36.0)
MCV: 83.7 fL (ref 80.0–100.0)
Platelets: 244 10*3/uL (ref 150–400)
RBC: 3.32 MIL/uL — ABNORMAL LOW (ref 4.22–5.81)
RDW: 15.2 % (ref 11.5–15.5)
WBC: 12.8 10*3/uL — ABNORMAL HIGH (ref 4.0–10.5)
nRBC: 0 % (ref 0.0–0.2)

## 2020-06-16 LAB — URINALYSIS, ROUTINE W REFLEX MICROSCOPIC
Bilirubin Urine: NEGATIVE
Glucose, UA: 150 mg/dL — AB
Hgb urine dipstick: NEGATIVE
Ketones, ur: NEGATIVE mg/dL
Leukocytes,Ua: NEGATIVE
Nitrite: NEGATIVE
Protein, ur: >=300 mg/dL — AB
Specific Gravity, Urine: 1.013 (ref 1.005–1.030)
pH: 6 (ref 5.0–8.0)

## 2020-06-16 LAB — OSMOLALITY: Osmolality: 400 mOsm/kg (ref 275–295)

## 2020-06-16 LAB — CBG MONITORING, ED
Glucose-Capillary: 223 mg/dL — ABNORMAL HIGH (ref 70–99)
Glucose-Capillary: 126 mg/dL — ABNORMAL HIGH (ref 70–99)

## 2020-06-16 LAB — OSMOLALITY, URINE: Osmolality, Ur: 425 mOsm/kg (ref 300–900)

## 2020-06-16 LAB — TROPONIN I (HIGH SENSITIVITY)
Troponin I (High Sensitivity): 85 ng/L — ABNORMAL HIGH (ref <18)
Troponin I (High Sensitivity): 80 ng/L — ABNORMAL HIGH (ref <18)

## 2020-06-16 LAB — SODIUM, URINE, RANDOM: Sodium, Ur: 56 mmol/L

## 2020-06-16 LAB — MAGNESIUM: Magnesium: 2.7 mg/dL — ABNORMAL HIGH (ref 1.7–2.4)

## 2020-06-16 LAB — LACTIC ACID, PLASMA: Lactic Acid, Venous: 1.4 mmol/L (ref 0.5–1.9)

## 2020-06-16 LAB — SARS CORONAVIRUS 2 BY RT PCR (HOSPITAL ORDER, PERFORMED IN ~~LOC~~ HOSPITAL LAB): SARS Coronavirus 2: NEGATIVE

## 2020-06-16 MED ORDER — FERROUS SULFATE 325 (65 FE) MG PO TABS
325.0000 mg | ORAL_TABLET | Freq: Every day | ORAL | Status: DC
  Administered 2020-06-19 – 2020-06-23 (×5): 325 mg via ORAL
  Filled 2020-06-16 (×5): qty 1

## 2020-06-16 MED ORDER — ROSUVASTATIN CALCIUM 5 MG PO TABS
5.0000 mg | ORAL_TABLET | Freq: Every evening | ORAL | Status: DC
  Administered 2020-06-18 – 2020-06-22 (×5): 5 mg via ORAL
  Filled 2020-06-16 (×5): qty 1

## 2020-06-16 MED ORDER — SODIUM BICARBONATE 650 MG PO TABS
650.0000 mg | ORAL_TABLET | Freq: Three times a day (TID) | ORAL | Status: DC
  Administered 2020-06-16: 650 mg via ORAL
  Filled 2020-06-16 (×2): qty 1

## 2020-06-16 MED ORDER — DEXTROSE 5 % IV SOLN: INTRAVENOUS | Status: DC

## 2020-06-16 MED ORDER — CARVEDILOL 6.25 MG PO TABS
6.2500 mg | ORAL_TABLET | Freq: Two times a day (BID) | ORAL | Status: DC
  Administered 2020-06-18 – 2020-06-23 (×10): 6.25 mg via ORAL
  Filled 2020-06-16 (×12): qty 1

## 2020-06-16 MED ORDER — SODIUM CHLORIDE 0.9% FLUSH
3.0000 mL | Freq: Two times a day (BID) | INTRAVENOUS | Status: DC
  Administered 2020-06-17 – 2020-06-23 (×12): 3 mL via INTRAVENOUS

## 2020-06-16 MED ORDER — ENSURE ENLIVE PO LIQD
Freq: Three times a day (TID) | ORAL | Status: DC
  Administered 2020-06-21 – 2020-06-29 (×6): 237 mL via ORAL
  Filled 2020-06-16: qty 237

## 2020-06-16 MED ORDER — HEPARIN SODIUM (PORCINE) 5000 UNIT/ML IJ SOLN
5000.0000 [IU] | Freq: Three times a day (TID) | INTRAMUSCULAR | Status: DC
  Administered 2020-06-16 – 2020-06-21 (×16): 5000 [IU] via SUBCUTANEOUS
  Filled 2020-06-16 (×16): qty 1

## 2020-06-16 MED ORDER — POLYETHYLENE GLYCOL 3350 17 G PO PACK: 17.0000 g | PACK | Freq: Every day | ORAL | Status: DC | PRN

## 2020-06-16 MED ORDER — HYPROMELLOSE (GONIOSCOPIC) 2.5 % OP SOLN
2.0000 [drp] | Freq: Three times a day (TID) | OPHTHALMIC | Status: DC
  Administered 2020-06-17 – 2020-06-23 (×18): 2 [drp] via OPHTHALMIC
  Filled 2020-06-16: qty 15

## 2020-06-16 MED ORDER — ACETAMINOPHEN 325 MG PO TABS
650.0000 mg | ORAL_TABLET | Freq: Four times a day (QID) | ORAL | Status: DC | PRN
  Administered 2020-06-18 – 2020-06-26 (×2): 650 mg via ORAL
  Filled 2020-06-16 (×2): qty 2

## 2020-06-16 MED ORDER — DICYCLOMINE HCL 20 MG PO TABS
20.0000 mg | ORAL_TABLET | Freq: Every day | ORAL | Status: DC
  Administered 2020-06-19 – 2020-06-29 (×11): 20 mg via ORAL
  Filled 2020-06-16 (×12): qty 1

## 2020-06-16 MED ORDER — ISOSORBIDE MONONITRATE ER 30 MG PO TB24
30.0000 mg | ORAL_TABLET | Freq: Every day | ORAL | Status: DC
  Administered 2020-06-17 – 2020-06-22 (×3): 30 mg via ORAL
  Filled 2020-06-16 (×5): qty 1

## 2020-06-16 MED ORDER — FOLIC ACID 1 MG PO TABS
1.0000 mg | ORAL_TABLET | Freq: Every day | ORAL | Status: DC
  Administered 2020-06-19 – 2020-06-23 (×5): 1 mg via ORAL
  Filled 2020-06-16 (×5): qty 1

## 2020-06-16 MED ORDER — SODIUM CHLORIDE 0.9 % IV BOLUS
1000.0000 mL | Freq: Once | INTRAVENOUS | Status: AC
  Administered 2020-06-16: 1000 mL via INTRAVENOUS

## 2020-06-16 MED ORDER — AMLODIPINE BESYLATE 10 MG PO TABS
10.0000 mg | ORAL_TABLET | Freq: Every day | ORAL | Status: DC
  Administered 2020-06-19 – 2020-06-23 (×5): 10 mg via ORAL
  Filled 2020-06-16 (×5): qty 1

## 2020-06-16 MED ORDER — INSULIN ASPART 100 UNIT/ML ~~LOC~~ SOLN
0.0000 [IU] | Freq: Three times a day (TID) | SUBCUTANEOUS | Status: DC
  Administered 2020-06-17: 2 [IU] via SUBCUTANEOUS
  Administered 2020-06-17: 1 [IU] via SUBCUTANEOUS
  Administered 2020-06-18: 2 [IU] via SUBCUTANEOUS

## 2020-06-16 MED ORDER — DEXTROSE 5 % IV SOLN: INTRAVENOUS | Status: AC

## 2020-06-16 MED ORDER — ASPIRIN 81 MG PO CHEW
81.0000 mg | CHEWABLE_TABLET | Freq: Every day | ORAL | Status: DC
  Administered 2020-06-19 – 2020-06-23 (×5): 81 mg via ORAL
  Filled 2020-06-16 (×5): qty 1

## 2020-06-16 MED ORDER — TERAZOSIN HCL 5 MG PO CAPS
5.0000 mg | ORAL_CAPSULE | Freq: Every day | ORAL | Status: DC
  Administered 2020-06-16 – 2020-06-22 (×5): 5 mg via ORAL
  Filled 2020-06-16 (×8): qty 1

## 2020-06-16 MED ORDER — ACETAMINOPHEN 650 MG RE SUPP: 650.0000 mg | Freq: Four times a day (QID) | RECTAL | Status: DC | PRN

## 2020-06-16 MED ORDER — INSULIN ASPART 100 UNIT/ML ~~LOC~~ SOLN: 0.0000 [IU] | Freq: Every day | SUBCUTANEOUS | Status: DC

## 2020-06-16 MED ORDER — LOPERAMIDE HCL 2 MG PO CAPS: 2.0000 mg | ORAL_CAPSULE | ORAL | Status: DC | PRN

## 2020-06-16 NOTE — ED Notes (Signed)
Urine sent to lab WITH a culture

## 2020-06-16 NOTE — ED Provider Notes (Signed)
[facility] EMERGENCY DEPARTMENT Provider Note   CSN: [id number] Arrival date & time: 06/16/20  1310     History No chief complaint on file.   Jshaun Keyon is a 80 y.o. male.  HPI Shortly after the patient's EKG was obtained at 1338, I was shown the tracing.  See notes under work-up tab  Patient presenting by EMS from nursing home for "altered mental status."  He is unable to give any history.  Minimal documentation arrives with patient.  During transport EMS noted that his 12-lead monitor was sinus rhythm with LVH and repolarization abnormality.  Level 5 caveat-dementia    Past Medical History:  Diagnosis Date  . Alcohol abuse    Patient denies, but was shared with people from [facility] by his male roommate.  . Anemia    chronic disease/renal disease  . Bilateral cataracts 04/08/2017  . Dementia ([place])   . DM (diabetes mellitus), type 2 with renal complications ([place])    uncertain when diagnosed  . HOH (hard of hearing)   . Hypertension   . Legally blind    B/L  . Stroke [facility]) [id number]   Embolic    Patient Active Problem List   Diagnosis Date Noted  . Dyslipidemia 04/12/2018  . Dementia ([place])   . Anemia   . TIA (transient ischemic attack) 02/20/2018  . CKD (chronic kidney disease), stage III ([place]) 01/20/2018  . CVA (cerebral vascular accident) ([place]) 01/18/2018  . Stroke ([place]) 01/17/2018  . Hypertension 06/26/2017  . Acute kidney injury superimposed on chronic kidney disease ([place]) 06/25/2017  . Left retinal detachment   . 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 Bilateral cataracts 04/08/2017  . DM (diabetes mellitus), type 2 with renal complications ([place])   . Alcohol abuse   . Poor vision   . Anemia of chronic disease 02/13/2017  . Diffuse brain atrophy ([place]) 02/13/2017  . Epigastric pain 02/11/2017  . Fall at home 11/16/2013  . Closed TBI (traumatic brain injury) ([place]) 11/16/2013  . SDH (subdural  hematoma) ([place]) 11/15/2013    Past Surgical History:  Procedure Laterality Date  . MEMBRANE PEEL Right 09/29/2017   Procedure: MEMBRANE PEEL WITH AIR GAS;  Surgeon: Jalene Mullet, MD;  Location: [place] [facility];  Service: Ophthalmology;  Laterality: Right;  . PARS PLANA VITRECTOMY Left 06/29/2017   Procedure: PARS PLANA VITRECTOMY WITH 25 GAUGE AND MEMBRANE PEEL WITH GAS EXCHANGE, AIR SILICONE OIL AND PHACO COAGULATION, LASER;  Surgeon: Jalene Mullet, MD;  Location: [place];  Service: Ophthalmology;  Laterality: Left;  . PARS PLANA VITRECTOMY Right 09/18/2017   Procedure: PARS PLANA VITRECTOMY WITH 25 GAUGE MEMBRANE PILL WITH AIR GAS SILICONE OIL AND PHOTOCOAGULATION;  Surgeon: Jalene Mullet, MD;  Location: [place];  Service: Ophthalmology;  Laterality: Right;  . PARS PLANA VITRECTOMY Right 09/29/2017   Procedure: PARS PLANA VITRECTOMY WITH 25 GAUGE;  Surgeon: Jalene Mullet, MD;  Location: [place];  Service: Ophthalmology;  Laterality: Right;  . PHOTOCOAGULATION Right 09/29/2017   Procedure: PHOTOCOAGULATION;  Surgeon: Jalene Mullet, MD;  Location: [place];  Service: Ophthalmology;  Laterality: Right;  . REPAIR OF COMPLEX TRACTION RETINAL DETACHMENT Right 09/29/2017   Procedure: REPAIR OF COMPLEX TRACTION RETINAL DETACHMENT;  Surgeon: Jalene Mullet, MD;  Location: [place];  Service: Ophthalmology;  Laterality: Right;       Family History  Problem Relation Age of Onset  . Lung disease Neg Hx     Social History   Tobacco Use  .  Smoking status: Former Smoker    Types: Cigarettes    Quit date: 05/19/1990    Years since quitting: 30.0  . Smokeless tobacco: Never Used  Vaping Use  . Vaping Use: Never used  Substance Use Topics  . 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 Drug use: No    Home Medications Prior to Admission medications   Medication Sig Start Date End Date Taking? Authorizing Provider  amLODipine (NORVASC) 10 MG  tablet Take 1 tablet (10 mg total) by mouth daily. 06/23/18  Yes Kerin Perna, NP  aspirin 81 MG chewable tablet Chew 1 tablet (81 mg total) by mouth daily. 06/23/18  Yes Kerin Perna, NP  Carboxymethylcellulose Sodium (REFRESH TEARS OP) Place 1 drop into both eyes 3 (three) times daily.   Yes [provider]  carvedilol (COREG) 6.25 MG tablet Take 1 tablet (6.25 mg total) by mouth 2 (two) times daily with a meal. 06/23/18  Yes Kerin Perna, NP  dicyclomine (BENTYL) 20 MG tablet Take 20 mg by mouth daily.   Yes [provider]  ferrous sulfate 325 (65 FE) MG tablet Take 325 mg by mouth daily.   Yes [provider]  folic acid (FOLVITE) 1 MG tablet Take 1 tablet (1 mg total) by mouth daily. 04/28/18  Yes [name], PA-C  hydrALAZINE (APRESOLINE) 50 MG tablet Take 1 tablet (50 mg total) by mouth 3 (three) times daily. 06/23/18  Yes Kerin Perna, NP  isosorbide mononitrate (IMDUR) 30 MG 24 hr tablet Take 1 tablet (30 mg total) by mouth at bedtime. 06/23/18  Yes Kerin Perna, NP  loperamide (IMODIUM) 2 MG capsule Take 2-4 mg by mouth See admin instructions. Take 2 capsules (4 mg) by mouth as needed after loose bowel movement, then take 1 capsule (2 mg) after each additional loose bowel movement (max 8 capsules (16 mg) in 24 hours   Yes [provider]  Nutritional Supplements (NUTRITIONAL SUPPLEMENT PO) Take 1 each by mouth 3 (three) times daily. Mighty Shake   Yes [provider]  rosuvastatin (CRESTOR) 5 MG tablet Take 1 tablet (5 mg total) by mouth every evening. 04/28/18  Yes [name], PA-C  sodium bicarbonate 650 MG tablet Take 650 mg by mouth 3 (three) times daily.   Yes [provider]  terazosin (HYTRIN) 5 MG capsule Take 1 capsule (5 mg total) by mouth at bedtime. 04/28/18  Yes [name], PA-C    Allergies    Patient has no known allergies.  Review of Systems   Review of Systems   Unable to perform ROS: Dementia    Physical Exam Updated Vital Signs BP 136/86   Pulse 69   Temp (!) 96.9 F (36.1 C) (Rectal)   Resp 15   Ht '5\' 2"'$  (1.575 m)   Wt 49.4 kg   SpO2 99%   BMI 19.92 kg/m   Physical Exam Vitals and nursing note reviewed.  Constitutional:      General: He is not in acute distress.    Appearance: He is well-developed and well-nourished. He is ill-appearing. He is not toxic-appearing or diaphoretic.     Comments: Elderly, frail, tremulous  HENT:     Head: Normocephalic and atraumatic.     Right Ear: External ear normal.     Left Ear: External ear normal.  Eyes:     Extraocular Movements: EOM  normal.     Conjunctiva/sclera: Conjunctivae normal.     Comments: Opaque anterior right eye consistent with chronic blindness  Neck:     Trachea: Phonation normal.  Cardiovascular:     Rate and Rhythm: Normal rate and regular rhythm.     Heart sounds: Normal heart sounds.  Pulmonary:     Effort: Pulmonary effort is normal. No respiratory distress.     Breath sounds: Normal breath sounds. No stridor.  Chest:     Chest wall: No bony tenderness.  Abdominal:     General: There is no distension.     Palpations: Abdomen is soft. There is no mass.     Tenderness: There is no abdominal tenderness.     Hernia: No hernia is present.  Musculoskeletal:        General: Normal range of motion.     Cervical back: Normal range of motion and neck supple.     Right lower leg: No edema.     Left lower leg: No edema.  Skin:    General: Skin is warm, dry and intact.     Coloration: Skin is not jaundiced or pale.  Neurological:     Mental Status: He is alert.     Cranial Nerves: No cranial nerve deficit.     Motor: No abnormal muscle tone.     Comments: Normal vocalizations, not purposefully answering questions.  Psychiatric:        Mood and Affect: Mood and affect and mood normal.        Behavior: Behavior normal.     ED Results / Procedures / Treatments    Labs (all labs ordered are listed, but only abnormal results are displayed) Labs Reviewed  COMPREHENSIVE METABOLIC PANEL - Abnormal; Notable for the following components:      Result Value   Sodium 157 (*)    Chloride 120 (*)    Glucose, Bld 274 (*)    BUN 136 (*)    Creatinine, Ser 7.88 (*)    Calcium 8.1 (*)    Albumin 2.8 (*)    AST 12 (*)    GFR, Estimated 6 (*)    All other components within normal limits  CBC - Abnormal; Notable for the following components:   WBC 12.8 (*)    RBC 3.32 (*)    Hemoglobin 8.4 (*)    HCT 27.8 (*)    MCH 25.3 (*)    All other components within normal limits  URINALYSIS, ROUTINE W REFLEX MICROSCOPIC - Abnormal; Notable for the following components:   APPearance HAZY (*)    Glucose, UA 150 (*)    Protein, ur >=300 (*)    Bacteria, UA RARE (*)    All other components within normal limits  CBG MONITORING, ED - Abnormal; Notable for the following components:   Glucose-Capillary 223 (*)    All other components within normal limits  I-STAT VENOUS BLOOD GAS, ED - Abnormal; Notable for the following components:   pO2, Ven 145.0 (*)    Sodium 159 (*)    Calcium, Ion 1.09 (*)    HCT 25.0 (*)    Hemoglobin 8.5 (*)    All other components within normal limits  TROPONIN I (HIGH SENSITIVITY) - Abnormal; Notable for the following components:   Troponin I (High Sensitivity) 85 (*)    All other components within normal limits  TROPONIN I (HIGH SENSITIVITY) - Abnormal; Notable for the following components:   Troponin I (High Sensitivity) 80 (*)  All other components within normal limits  SARS CORONAVIRUS 2 BY RT PCR (HOSPITAL ORDER, [facility])  CULTURE, BLOOD (ROUTINE X 2)  CULTURE, BLOOD (ROUTINE X 2)  LACTIC ACID, PLASMA  SODIUM, URINE, RANDOM  OSMOLALITY, URINE  OSMOLALITY    EKG EKG Interpretation  Date/Time:  Saturday June 16 2020 13:49:59 EST Ventricular Rate:  77 PR Interval:  128 QRS  Duration: 104 QT Interval:  420 QTC Calculation: 476 R Axis:   87 Text Interpretation: Sinus rhythm Borderline right axis deviation LVH with secondary repolarization abnormality ST depr, consider ischemia, inferior leads Anterior ST elevation, probably due to LVH Borderline prolonged QT interval Artifact in lead(s) I II III aVR aVL aVF V1 V2 V3 V4 V5 Since last tracing of earlier today artifact is more pronounced Otherwise no significant change Confirmed by Daleen Bo [phone]) on 06/16/2020 1:53:12 PM   Radiology DG Chest Port 1 View  Result Date: 06/16/2020 CLINICAL DATA:  Altered mental status. EXAM: PORTABLE CHEST 1 VIEW COMPARISON:  Radiograph 02/20/2018, CT 07/09/2017 FINDINGS: Chronic ill-defined opacity at the left lung base, but progressed from prior exam. Bronchiectasis noted on prior CT. No other focal airspace disease. The heart is normal in size normal mediastinal contours. No significant pleural effusion. No pulmonary edema. No pneumothorax. No acute osseous abnormalities are seen. IMPRESSION: Chronic but progressed left basilar opacity, corresponding to bronchiectasis and mucous plugging on prior CT. Electronically Signed   By: Keith Rake M.D.   On: 06/16/2020 16:47    Procedures .Critical Care Performed by: Daleen Bo, MD Authorized by: Daleen Bo, MD   Critical care provider statement:    Critical care time (minutes):  75   Critical care start time:  06/16/2020 1:50 PM   Critical care end time:  06/16/2020 6:38 PM   Critical care time was exclusive of:  Separately billable procedures and treating other patients   Critical care was necessary to treat or prevent imminent or life-threatening deterioration of the following conditions:  Metabolic crisis   Critical care was time spent personally by me on the following activities:  Blood draw for specimens, development of treatment plan with patient or surrogate, discussions with consultants, evaluation of patient's  response to treatment, examination of patient, obtaining history from patient or surrogate, ordering and performing treatments and interventions, ordering and review of laboratory studies, pulse oximetry, re-evaluation of patient's condition, review of old charts and ordering and review of radiographic studies     Medications Ordered in ED Medications  sodium chloride 0.9 % bolus 1,000 mL (0 mLs Intravenous Stopped 06/16/20 1558)    ED Course  I have reviewed the triage vital signs and the nursing notes.  Pertinent labs & imaging results that were available during my care of the patient were reviewed by me and considered in my medical decision making (see chart for details).  Clinical Course as of 06/16/20 Bosie Helper  Sat Jun 16, 2020  1357 I have asked secretary to contact the STEMI doctor, concerning the patient's abnormal EKG and his presenting complaints. [EW]  [id number] Patient presenting with abnormal EKG, possible STEMI, but no good history for acute MI.  He has dementia and lives in a nursing home.  He is presenting for evaluation of altered mental status.  No documented CODE STATUS with paperwork from his facility. [EW]  1400 Case has been discussed with Dr. Quay Burow, regarding EKG.  We will proceed with interpretation of LVH, causing EKG abnormality. [EW]  1625 I was  able to reach her social worker, [name], phone number on chart, regarding this patient.  She is well aware of him.  She states that he does not have healthcare power of attorney.  She states that he has significant dementia which makes it difficult for him to know what his history is.  The patient lives in a skilled nursing facility.  It remains unclear exactly why he was sent here other than for reported altered mental status.   [name] was able to speak to the patient on speaker phone, and help him to understand he needs to stay in the hospital for further care and treatment.  We plan on admitting for dehydration, AKI. [EW]   1740 After spontaneously voiding, on the stretcher, urinary bladder scanning was done revealing greater than 350 cc urine.  I asked the tech to do an in and out catheter to drain the urinary bladder.  He recovered 700 cc of urine from the urinary bladder [EW]    Clinical Course User Index [EW] Daleen Bo, MD   MDM Rules/Calculators/A&P                           Patient Vitals for the past 24 hrs:  BP Temp Temp src Pulse Resp SpO2 Height Weight  06/16/20 1815 136/86 -- -- 69 15 99 % -- --  06/16/20 1745 121/61 -- -- 72 19 100 % -- --  06/16/20 1744 -- -- -- 72 19 100 % -- --  06/16/20 1743 -- -- -- 77 (!) 25 96 % -- --  06/16/20 1742 -- -- -- -- 18 -- -- --  06/16/20 1700 121/61 -- -- 72 18 100 % -- --  06/16/20 1630 (!) 152/72 -- -- 73 18 100 % -- --  06/16/20 1557 (!) 155/59 -- -- 72 15 100 % -- --  06/16/20 1557 -- (!) 96.9 F (36.1 C) Rectal -- -- -- -- --  06/16/20 1556 -- -- -- 73 19 98 % -- --  06/16/20 1555 -- -- -- 76 17 99 % -- --  06/16/20 1530 (!) 163/51 -- -- 77 18 100 % -- --  06/16/20 1525 -- -- -- 76 18 100 % -- --  06/16/20 1524 -- -- -- 77 18 100 % -- --  06/16/20 1523 -- -- -- 79 18 100 % -- --  06/16/20 1522 -- -- -- 77 (!) 24 99 % -- --  06/16/20 1521 -- -- -- 77 18 99 % -- --  06/16/20 1520 -- -- -- 77 (!) 21 99 % -- --  06/16/20 1519 -- -- -- 77 (!) 22 99 % -- --  06/16/20 1518 -- -- -- 79 18 100 % -- --  06/16/20 1515 -- -- -- 75 14 100 % -- --  06/16/20 1514 -- -- -- 81 (!) 23 100 % -- --  06/16/20 1445 (!) 161/57 -- -- 74 (!) 22 100 % -- --  06/16/20 1430 (!) 140/105 -- -- 77 (!) 21 100 % -- --  06/16/20 1414 -- -- -- -- -- -- '5\' 2"'$  (1.575 m) 49.4 kg  06/16/20 1400 93/80 -- -- 78 19 99 % -- --    6:38 PM Reevaluation with update and discussion. After initial assessment and treatment, an updated evaluation reveals no change in clinical status, continues to be confused.  He does appear to be more comfortable. Daleen Bo   Medical Decision  Making:  This  patient is presenting for evaluation of altered mental status, which does require a range of treatment options, and is a complaint that involves a moderate risk of morbidity and mortality. The differential diagnoses include acute illness, metabolic disorder, CNS abnormality. I decided to review old records, and in summary elderly patient arriving from nursing home with abnormal EKG.  Discussed no EKG with cardiologist on arrival, he suspects changes are related to LVH only.  I obtained additional historical information from social worker who knows the patient.  Clinical Laboratory Tests Ordered, included CBC, Metabolic panel, Urinalysis and Troponin. Review indicates hyponatremia, elevated creatinine, low calcium, high glucose, high BUN, low albumin, anemia. Radiologic Tests Ordered, included chest x-ray.  I independently Visualized: Radiograph images, which show chronic lung disease    Critical Interventions-evaluation, IV fluids, laboratory testing, radiographs, observation, reassessment.  Foley catheterization ordered for urinary bladder retention.  Found greater than 700 cc of urine, on In-N-Out testing.  He will need ongoing evaluation for recurrent bladder outlet obstruction  After These Interventions, the Patient was reevaluated and was found with significantly elevated sodium, and creatinine.  I suspect that this is largely related to poor oral intake.  History is difficult because of dementia and his nursing home could not be contacted by the Education officer, museum.  Patient does not have a healthcare power of attorney.  He is apparently full code however there are no family members in the area to discuss this with.  By report of social work, his niece is estranged, and his other family is in [place].  Patient does have  urinary retention, with at least 700 cc urine in urinary bladder.  This will have to be followed expectantly but and likely contributes to the significant elevation of  creatinine.  Note that the patient did spontaneously void while in the ED.  Doubt sepsis, or hemodynamic instability.  IV fluid resuscitation begun in the ED.  CRITICAL CARE-yes Performed by: Daleen Bo  Nursing Notes Reviewed/ Care Coordinated Applicable Imaging Reviewed Interpretation of Laboratory Data incorporated into ED treatment  5:52 PM-Consult complete with on-call admitting service for unassigned medical patient. Patient case explained and discussed.  He agrees to admit patient for further evaluation and treatment. Call ended at 6:30 PM   Final Clinical Impression(s) / ED Diagnoses Final diagnoses:  Altered mental status, unspecified altered mental status type  AKI (acute kidney injury) ([place])  Hypernatremia  Urinary retention  Dementia without behavioral disturbance, unspecified dementia type [facility])    Rx / DC Orders ED Discharge Orders    None       Daleen Bo, MD 06/16/20 1839

## 2020-06-16 NOTE — ED Notes (Signed)
Pt SW [place] [phone]

## 2020-06-16 NOTE — ED Notes (Signed)
Bladder scanner completed. Result: 354m

## 2020-06-16 NOTE — ED Triage Notes (Addendum)
Patient arrived by [facility] from [facility] due to concern for more somnolent behavior than normal per staff. Patient speaks [place]et[place] and will open eyes when spoken to also answering questions in [place].. VSS and BS 295, no hx of diabetes. Patient complains of posterior neck pain, EMS and NH staff deny injury

## 2020-06-16 NOTE — H&P (Addendum)
History and Physical   Timothy [name] PC:[id number] DOB: 1940/10/18 DOA: 06/16/2020  PCP: [name], PA-C   Patient coming from: [facility] facility  Chief Complaint: Increased somnolence  HPI: Timothy [name] is a 80 y.o. male with medical history significant of CKD 3, alcohol use, anemia of chronic disease, TBI, CVA, dementia, diabetes, hyperlipidemia, hypertension, legally blind in both eyes who presents with reports of increased somnolence at his nursing facility.  As above nursing facility note he had increased somnolence today.  He will open his eyes when spoken to.  His native tongue is [place] and he speaks some English per reports.  He currently will respond to questions but but not always appropriately and in a mix of some English and [place]agn[place].  He did state that he is blind. He does state that he is blind.  Unable to complete review of systems.  Does appear to indicate no when asked if he has any pain.  Due to his lack of family unable to obtain additional collateral beyond what EDP obtained from his Education officer, museum.  Which is that he has no family in the area and has no healthcare power of attorney.  Some further details below under CODE STATUS.  ED Course: Vital signs in ED significant for blood pressure in the 0000000 to 0000000 systolic.  Otherwise vital signs stable.  Lab work-up showed sodium of 137, chloride 120, BUN 136, creatinine 7.8 up from baseline of 2.32 years ago, glucose 274.  LFTs with calcium of 8.1 which corrects to normal considering albumin of 2.8.  CBC showed hemoglobin 8.4 which relatively stable compared to his previous 2 years ago.  WBC mildly elevated at 12.8.  Lactic acid normal with repeat pending.  Troponin trend 85 and 80 on repeat.  Respiratory panel negative for Covid.  Urinalysis, serum awesome, and urine awesome's, urine sodium ordered and are pending.  Blood cultures pending.  VBG showed pH 7.31 and PCO2 54.  Chest x-ray showed chronic left basilar  opacity corresponding to bronchiectasis and mucous plugging on prior CT, possible progression of this opacity.  Review of Systems: Unable to be obtained due to patient's dementia, altered mental status, language barrier   Past Medical History:  Diagnosis Date  . Alcohol abuse    Patient denies, but was shared with people from [facility] by his male roommate.  . Anemia    chronic disease/renal disease  . Bilateral cataracts 04/08/2017  . Dementia ([place])   . DM (diabetes mellitus), type 2 with renal complications ([place])    uncertain when diagnosed  . HOH (hard of hearing)   . Hypertension   . Legally blind    B/L  . Stroke [facility]) [id number]   Embolic    Past Surgical History:  Procedure Laterality Date  . MEMBRANE PEEL Right 09/29/2017   Procedure: MEMBRANE PEEL WITH AIR GAS;  Surgeon: Jalene Mullet, MD;  Location: [place];  Service: Ophthalmology;  Laterality: Right;  . PARS PLANA VITRECTOMY Left 06/29/2017   Procedure: PARS PLANA VITRECTOMY WITH 25 GAUGE AND MEMBRANE PEEL WITH GAS EXCHANGE, AIR SILICONE OIL AND PHACO COAGULATION, LASER;  Surgeon: Jalene Mullet, MD;  Location: [place];  Service: Ophthalmology;  Laterality: Left;  . PARS PLANA VITRECTOMY Right 09/18/2017   Procedure: PARS PLANA VITRECTOMY WITH 25 GAUGE MEMBRANE PILL WITH AIR GAS SILICONE OIL AND PHOTOCOAGULATION;  Surgeon: Jalene Mullet, MD;  Location: [place];  Service: Ophthalmology;  Laterality: Right;  . PARS PLANA VITRECTOMY Right 09/29/2017   Procedure:  PARS PLANA VITRECTOMY WITH 25 GAUGE;  Surgeon: Jalene Mullet, MD;  Location: [place];  Service: Ophthalmology;  Laterality: Right;  . PHOTOCOAGULATION Right 09/29/2017   Procedure: PHOTOCOAGULATION;  Surgeon: Jalene Mullet, MD;  Location: [place];  Service: Ophthalmology;  Laterality: Right;  . 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 His smoking use included cigarettes. He has never used smokeless tobacco. He reports current alcohol use. He reports that he does not use drugs.  Unable to be confirmed due to mental status and language barrier  No Known Allergies  Family History  Problem Relation Age of Onset  . Lung disease Neg Hx   Chart reviewed on admission  Prior to Admission medications   Medication Sig Start Date End Date Taking? Authorizing Provider  amLODipine (NORVASC) 10 MG tablet Take 1 tablet (10 mg total) by mouth daily. 06/23/18  Yes Kerin Perna, NP  aspirin 81 MG chewable tablet Chew 1 tablet (81 mg total) by mouth daily. 06/23/18  Yes Kerin Perna, NP  Carboxymethylcellulose Sodium (REFRESH TEARS OP) Place 1 drop into both eyes 3 (three) times daily.   Yes [provider]  carvedilol (COREG) 6.25 MG tablet Take 1 tablet (6.25 mg total) by mouth 2 (two) times daily with a meal. 06/23/18  Yes Kerin Perna, NP  dicyclomine (BENTYL) 20 MG tablet Take 20 mg by mouth daily.   Yes [provider]  ferrous sulfate 325 (65 FE) MG tablet Take 325 mg by mouth daily.   Yes [provider]  folic acid (FOLVITE) 1 MG tablet Take 1 tablet (1 mg total) by mouth daily. 04/28/18  Yes [name], PA-C  hydrALAZINE (APRESOLINE) 50 MG tablet Take 1 tablet (50 mg total) by mouth 3 (three) times daily. 06/23/18  Yes Kerin Perna, NP  isosorbide mononitrate (IMDUR) 30 MG 24 hr tablet Take 1 tablet (30 mg total) by mouth at bedtime. 06/23/18  Yes Kerin Perna, NP  loperamide (IMODIUM) 2 MG capsule Take 2-4 mg by mouth See admin instructions. Take 2 capsules (4 mg) by mouth as needed after loose bowel movement, then take 1 capsule (2 mg) after each additional loose bowel movement (max 8 capsules (16 mg) in 24 hours   Yes [provider]  Nutritional Supplements  (NUTRITIONAL SUPPLEMENT PO) Take 1 each by mouth 3 (three) times daily. Mighty Shake   Yes [provider]  rosuvastatin (CRESTOR) 5 MG tablet Take 1 tablet (5 mg total) by mouth every evening. 04/28/18  Yes [name], PA-C  sodium bicarbonate 650 MG tablet Take 650 mg by mouth 3 (three) times daily.   Yes [provider]  terazosin (HYTRIN) 5 MG capsule Take 1 capsule (5 mg total) by mouth at bedtime. 04/28/18  Yes [name], PA-C    Physical Exam: Vitals:   06/16/20 1743 06/16/20 1744 06/16/20 1745 06/16/20 1815  BP:   121/61 136/86  Pulse: 77 72 72 69  Resp: (!) '25 19 19 15  '$ Temp:      TempSrc:      SpO2: 96% 100% 100% 99%  Weight:      Height:       Physical Exam Constitutional:      Comments: Thin, Chronically  ill-appearing elderly male  HENT:     Head: Normocephalic and atraumatic.     Mouth/Throat:     Mouth: Mucous membranes are moist.     Pharynx: Oropharynx is clear.  Eyes:     Extraocular Movements: Extraocular movements intact.     Comments: Cloudiness consistent with chronic vision loss  Cardiovascular:     Rate and Rhythm: Normal rate and regular rhythm.     Pulses: Normal pulses.     Heart sounds: Normal heart sounds.  Pulmonary:     Effort: Pulmonary effort is normal. No respiratory distress.     Breath sounds: Normal breath sounds.  Abdominal:     General: Bowel sounds are normal. There is no distension.     Palpations: Abdomen is soft.     Tenderness: There is no abdominal tenderness.  Musculoskeletal:        General: No swelling or deformity.  Skin:    General: Skin is warm and dry.  Neurological:     General: No focal deficit present.     Mental Status: Mental status is at baseline.    Labs on Admission: I have personally reviewed following labs and imaging studies  CBC: Recent Labs  Lab 06/16/20 1344 06/16/20 1450  WBC 12.8*  --   HGB 8.4* 8.5*  HCT 27.8* 25.0*  MCV 83.7  --   PLT 244  --     Basic  Metabolic Panel: Recent Labs  Lab 06/16/20 1344 06/16/20 1450  NA 157* 159*  K 4.9 4.6  CL 120*  --   CO2 24  --   GLUCOSE 274*  --   BUN 136*  --   CREATININE 7.88*  --   CALCIUM 8.1*  --     GFR: Estimated Creatinine Clearance: 5.1 mL/min (A) (by C-G formula based on SCr of 7.88 mg/dL (H)).  Liver Function Tests: Recent Labs  Lab 06/16/20 1344  AST 12*  ALT 7  ALKPHOS 81  BILITOT 0.4  PROT 6.5  ALBUMIN 2.8*    Urine analysis:    Component Value Date/Time   COLORURINE YELLOW 06/16/2020 1743   APPEARANCEUR HAZY (A) 06/16/2020 1743   LABSPEC 1.013 06/16/2020 1743   PHURINE 6.0 06/16/2020 1743   GLUCOSEU 150 (A) 06/16/2020 1743   HGBUR NEGATIVE 06/16/2020 1743   [place] 06/16/2020 1743   [place] 06/16/2020 1743   PROTEINUR >=300 (A) 06/16/2020 1743   UROBILINOGEN 0.2 11/15/2013 0231   NITRITE NEGATIVE 06/16/2020 1743   LEUKOCYTESUR NEGATIVE 06/16/2020 1743    Radiological Exams on Admission: DG Chest Port 1 View  Result Date: 06/16/2020 CLINICAL DATA:  Altered mental status. EXAM: PORTABLE CHEST 1 VIEW COMPARISON:  Radiograph 02/20/2018, CT 07/09/2017 FINDINGS: Chronic ill-defined opacity at the left lung base, but progressed from prior exam. Bronchiectasis noted on prior CT. No other focal airspace disease. The heart is normal in size normal mediastinal contours. No significant pleural effusion. No pulmonary edema. No pneumothorax. No acute osseous abnormalities are seen. IMPRESSION: Chronic but progressed left basilar opacity, corresponding to bronchiectasis and mucous plugging on prior CT. Electronically Signed   By: Keith Rake M.D.   On: 06/16/2020 16:47    EKG: Independently reviewed.  Normal sinus rhythm, LVH with repolarization, repolarization abnormality more prominent from previous.  Assessment/Plan Principal Problem:   Acute metabolic encephalopathy Active Problems:   Anemia of chronic disease   DM (diabetes mellitus),  type 2 with renal complications (HCC)   Acute kidney injury superimposed on  chronic kidney disease (HCC)   Legally blind   Hypertension   History of CVA (cerebrovascular accident)   Dementia ([place])   Dyslipidemia   Uremia   Hypernatremia  Dementia History of CVA and TBI Acute metabolic encephalopathy > Patient presented due to increased somnolence compared to his baseline per nursing home staff. > Is able to be alert and respond to questions though not necessarily appropriately and in a mix of broken English and native tongue. > Likely due to combination of hypernatremia at 157 and uremia with BUN of 136 in the setting of acute on chronic renal failure suspected to be secondary to obstructive Kolls giving significant retention of 700 cc post void. > Does have mild leukocytosis this could be reactive, checking urinalysis and blood cultures for completeness.  Chest x-ray showed chronic basilar opacity which correspond to bronchiectasis and mucous plugging on prior CT. - Correct renal function and electrolytes as below and monitor for improvement - Follow-up urinalysis and blood culture  Acute renal failure on CKD 3 Hypernatremia Uremia > Labs showed sodium 137, BUN 136, creatinine 7.8 from a baseline of 2.32 years ago. > Renal failure likely due to obstructive causes no history of BPH elicited but he is on terazosin and he did have 700 cc output after voiding on in and out cath > Received 1 L normal saline bolus in ED - Renal ultrasound - As needed bladder scans - As needed and out cath - We will place Foley if retention recurs - D5W at 75 cc an hour for 18 hours to replace free water deficit of about 1.4 L - Trend Na overnight and Daily, Hope to see correction of Na of 10-12 in 24 hours  Anemia of chronic disease > History of this hemoglobin is currently 0.4 which is a little bit down from his previous baseline a couple years ago - Continue iron and folate supplementation - Trend  CBC  Hyperlipidemia History of CVA - Continue home aspirin and rosuvastatin  Diabetes > Glucose 274 on BMP will have to monitor closely given he is on D5W overnight - SSI with nightly coverage  Hypertension - Continue home amlodipine, Imdur, carvedilol -Hold home hydralazine for now  Troponinemia > Troponin initially elevated to 85 with 80 on repeat.  Due to increase in ST changes due to LVH with repolarization abnormality, EDP spoke with on-call STEMI provider who agreed that the changes continue to be due to LVH.  Legally blind in both eyes - Continue home eyedrops  DVT prophylaxis: Heparin  Code Status:   Full  Family Communication:  None available to contact.  EDP did speak with his social worker who is aware of the patient.  Social worker states that the patient has no healthcare power of attorney and does not have any family living in the area.  He is a resident of SNF and has significant dementia so they were unable to change his CODE STATUS previously due to his lack of insight and no others to act as power of attorney.   Disposition Plan:   Patient is from:  Nursing facility  Anticipated DC to:  Nursing facility  Anticipated DC date:  1-5 days  Anticipated DC barriers: Demented and does not have healthcare power of attorney   Consults called:  None  Admission status:  Observation, telemetry  Severity of Illness: The appropriate patient status for this patient is OBSERVATION. Observation status is judged to be reasonable and necessary in order to provide the required  intensity of service to ensure the patient's safety. The patient's presenting symptoms, physical exam findings, and initial radiographic and laboratory data in the context of their medical condition is felt to place them at decreased risk for further clinical deterioration. Furthermore, it is anticipated that the patient will be medically stable for discharge from the hospital within 2 midnights of admission. The  following factors support the patient status of observation.   " The patient's presenting symptoms include increased somnolence. " The physical exam findings include thin elderly male with confusion. " The initial radiographic and laboratory data are sodium 137, BUN 136, creatinine 7.8, glucose 274, hemoglobin 8.4, WBC 12.8, troponin 85 with 80 on repeat.Marcelyn Bruins MD [facility]  How to contact the [facility] Attending or Consulting provider [place] or covering provider during after hours [place], for this patient?   1. Check the care team in [facility] and look for a) attending/consulting [facility] provider listed and b) the [facility] team listed 2. Log into [email or web address] and use [place]'s universal password to access. If you do not have the password, please contact the hospital operator. 3. Locate the [facility] provider you are looking for under [facility] and page to a number that you can be directly reached. 4. If you still have difficulty reaching the provider, please page the [facility] (Director on Call) for the Hospitalists listed on amion for assistance.  06/16/2020, 7:01 PM

## 2020-06-16 NOTE — ED Notes (Signed)
Date and time results received: 06/16/20   Test: Osmolality   Critical Value: 400 Name of Provider Notified: Trilby Drummer alexander

## 2020-06-17 DIAGNOSIS — G9341 Metabolic encephalopathy: Secondary | ICD-10-CM | POA: Diagnosis present

## 2020-06-17 DIAGNOSIS — E870 Hyperosmolality and hypernatremia: Secondary | ICD-10-CM | POA: Diagnosis present

## 2020-06-17 DIAGNOSIS — N189 Chronic kidney disease, unspecified: Secondary | ICD-10-CM

## 2020-06-17 DIAGNOSIS — D638 Anemia in other chronic diseases classified elsewhere: Secondary | ICD-10-CM | POA: Diagnosis not present

## 2020-06-17 DIAGNOSIS — Z8673 Personal history of transient ischemic attack (TIA), and cerebral infarction without residual deficits: Secondary | ICD-10-CM | POA: Diagnosis not present

## 2020-06-17 DIAGNOSIS — N171 Acute kidney failure with acute cortical necrosis: Secondary | ICD-10-CM | POA: Diagnosis not present

## 2020-06-17 DIAGNOSIS — N184 Chronic kidney disease, stage 4 (severe): Secondary | ICD-10-CM | POA: Diagnosis present

## 2020-06-17 DIAGNOSIS — Z681 Body mass index (BMI) 19 or less, adult: Secondary | ICD-10-CM | POA: Diagnosis not present

## 2020-06-17 DIAGNOSIS — Z515 Encounter for palliative care: Secondary | ICD-10-CM | POA: Diagnosis not present

## 2020-06-17 DIAGNOSIS — S069X9S Unspecified intracranial injury with loss of consciousness of unspecified duration, sequela: Secondary | ICD-10-CM | POA: Diagnosis not present

## 2020-06-17 DIAGNOSIS — F0391 Unspecified dementia with behavioral disturbance: Secondary | ICD-10-CM | POA: Diagnosis not present

## 2020-06-17 DIAGNOSIS — E43 Unspecified severe protein-calorie malnutrition: Secondary | ICD-10-CM | POA: Diagnosis present

## 2020-06-17 DIAGNOSIS — N179 Acute kidney failure, unspecified: Secondary | ICD-10-CM

## 2020-06-17 DIAGNOSIS — R339 Retention of urine, unspecified: Secondary | ICD-10-CM | POA: Diagnosis present

## 2020-06-17 DIAGNOSIS — R64 Cachexia: Secondary | ICD-10-CM | POA: Diagnosis present

## 2020-06-17 DIAGNOSIS — R54 Age-related physical debility: Secondary | ICD-10-CM | POA: Diagnosis present

## 2020-06-17 DIAGNOSIS — E785 Hyperlipidemia, unspecified: Secondary | ICD-10-CM | POA: Diagnosis present

## 2020-06-17 DIAGNOSIS — Z66 Do not resuscitate: Secondary | ICD-10-CM | POA: Diagnosis present

## 2020-06-17 DIAGNOSIS — H548 Legal blindness, as defined in USA: Secondary | ICD-10-CM | POA: Diagnosis present

## 2020-06-17 DIAGNOSIS — F0150 Vascular dementia without behavioral disturbance: Secondary | ICD-10-CM | POA: Diagnosis present

## 2020-06-17 DIAGNOSIS — E1122 Type 2 diabetes mellitus with diabetic chronic kidney disease: Secondary | ICD-10-CM | POA: Diagnosis present

## 2020-06-17 DIAGNOSIS — E860 Dehydration: Secondary | ICD-10-CM | POA: Diagnosis present

## 2020-06-17 DIAGNOSIS — Z20822 Contact with and (suspected) exposure to covid-19: Secondary | ICD-10-CM | POA: Diagnosis present

## 2020-06-17 DIAGNOSIS — R4182 Altered mental status, unspecified: Secondary | ICD-10-CM | POA: Diagnosis not present

## 2020-06-17 DIAGNOSIS — Z87820 Personal history of traumatic brain injury: Secondary | ICD-10-CM | POA: Diagnosis not present

## 2020-06-17 DIAGNOSIS — Z7189 Other specified counseling: Secondary | ICD-10-CM | POA: Diagnosis not present

## 2020-06-17 DIAGNOSIS — E872 Acidosis: Secondary | ICD-10-CM | POA: Diagnosis present

## 2020-06-17 DIAGNOSIS — F0390 Unspecified dementia without behavioral disturbance: Secondary | ICD-10-CM | POA: Diagnosis present

## 2020-06-17 DIAGNOSIS — D631 Anemia in chronic kidney disease: Secondary | ICD-10-CM | POA: Diagnosis present

## 2020-06-17 LAB — BASIC METABOLIC PANEL
Anion gap: 14 (ref 5–15)
BUN: 121 mg/dL — ABNORMAL HIGH (ref 8–23)
CO2: 22 mmol/L (ref 22–32)
Calcium: 7.9 mg/dL — ABNORMAL LOW (ref 8.9–10.3)
Chloride: 123 mmol/L — ABNORMAL HIGH (ref 98–111)
Creatinine, Ser: 7 mg/dL — ABNORMAL HIGH (ref 0.61–1.24)
GFR, Estimated: 7 mL/min — ABNORMAL LOW (ref >60)
Glucose, Bld: 156 mg/dL — ABNORMAL HIGH (ref 70–99)
Potassium: 4.6 mmol/L (ref 3.5–5.1)
Sodium: 159 mmol/L — ABNORMAL HIGH (ref 135–145)

## 2020-06-17 LAB — RENAL FUNCTION PANEL
Albumin: 2.7 g/dL — ABNORMAL LOW (ref 3.5–5.0)
Anion gap: 14 (ref 5–15)
BUN: 121 mg/dL — ABNORMAL HIGH (ref 8–23)
CO2: 24 mmol/L (ref 22–32)
Calcium: 8.3 mg/dL — ABNORMAL LOW (ref 8.9–10.3)
Chloride: 125 mmol/L — ABNORMAL HIGH (ref 98–111)
Creatinine, Ser: 6.88 mg/dL — ABNORMAL HIGH (ref 0.61–1.24)
GFR, Estimated: 7 mL/min — ABNORMAL LOW (ref >60)
Glucose, Bld: 161 mg/dL — ABNORMAL HIGH (ref 70–99)
Phosphorus: 6.8 mg/dL — ABNORMAL HIGH (ref 2.5–4.6)
Potassium: 4.7 mmol/L (ref 3.5–5.1)
Sodium: 163 mmol/L (ref 135–145)

## 2020-06-17 LAB — PHOSPHORUS: Phosphorus: 6.6 mg/dL — ABNORMAL HIGH (ref 2.5–4.6)

## 2020-06-17 LAB — CBG MONITORING, ED
Glucose-Capillary: 136 mg/dL — ABNORMAL HIGH (ref 70–99)
Glucose-Capillary: 129 mg/dL — ABNORMAL HIGH (ref 70–99)
Glucose-Capillary: 200 mg/dL — ABNORMAL HIGH (ref 70–99)
Glucose-Capillary: 203 mg/dL — ABNORMAL HIGH (ref 70–99)

## 2020-06-17 LAB — CBC
HCT: 30.8 % — ABNORMAL LOW (ref 39.0–52.0)
Hemoglobin: 8.8 g/dL — ABNORMAL LOW (ref 13.0–17.0)
MCH: 24.7 pg — ABNORMAL LOW (ref 26.0–34.0)
MCHC: 28.6 g/dL — ABNORMAL LOW (ref 30.0–36.0)
MCV: 86.5 fL (ref 80.0–100.0)
Platelets: 234 10*3/uL (ref 150–400)
RBC: 3.56 MIL/uL — ABNORMAL LOW (ref 4.22–5.81)
RDW: 14.9 % (ref 11.5–15.5)
WBC: 11.2 10*3/uL — ABNORMAL HIGH (ref 4.0–10.5)
nRBC: 0 % (ref 0.0–0.2)

## 2020-06-17 LAB — SODIUM
Sodium: 158 mmol/L — ABNORMAL HIGH (ref 135–145)
Sodium: 156 mmol/L — ABNORMAL HIGH (ref 135–145)
Sodium: 157 mmol/L — ABNORMAL HIGH (ref 135–145)

## 2020-06-17 LAB — TSH: TSH: 2.271 u[IU]/mL (ref 0.350–4.500)

## 2020-06-17 LAB — HEMOGLOBIN A1C
Hgb A1c MFr Bld: 6.6 % — ABNORMAL HIGH (ref 4.8–5.6)
Mean Plasma Glucose: 142.72 mg/dL

## 2020-06-17 MED ORDER — DEXTROSE 5 % IV SOLN: INTRAVENOUS | Status: AC

## 2020-06-17 MED ORDER — HYDRALAZINE HCL 20 MG/ML IJ SOLN: 10.0000 mg | INTRAMUSCULAR | Status: DC | PRN

## 2020-06-17 NOTE — ED Notes (Signed)
Date and time results received: 06/17/20 0645 (use smartphrase ".now" to insert current time)  Test: Saodium Critical Value: 163  Name of Provider Notified: Chieu  Orders Received? Or Actions Taken?: Review orders

## 2020-06-17 NOTE — Progress Notes (Signed)
PROGRESS NOTE    Timothy [name]  [id number] DOB: 1940/09/26 DOA: 06/16/2020 PCP: [name], PA-C    Brief Narrative:  80 y.o. male with medical history significant of CKD 3, alcohol use, anemia of chronic disease, TBI, CVA, dementia, diabetes, hyperlipidemia, hypertension, legally blind in both eyes who presents with reports of increased somnolence at his nursing facility.  Assessment & Plan:   Principal Problem:   Acute metabolic encephalopathy Active Problems:   Anemia of chronic disease   DM (diabetes mellitus), type 2 with renal complications (HCC)   Acute kidney injury superimposed on chronic kidney disease (HCC)   Legally blind   Hypertension   History of CVA (cerebrovascular accident)   Dementia ([place])   Dyslipidemia   Uremia   Hypernatremia   Dementia History of CVA and TBI Acute metabolic encephalopathy > Patient presented due to increased somnolence compared to his baseline per nursing home staff. > Suspect secondary to presenting hypernatremia and acute renal failure > Follow-up urinalysis, reviewed neg -Cont on hypotonic fluids, correct sodium per below  Acute renal failure on CKD 3 Hypernatremia Uremia > Presenting sodium 137, BUN 136, creatinine 7.8 from a baseline of 2.32 years ago. > Concerns for obstructive uropathy with 700cc noted on presentation - Renal ultrasound reviewed, findings of medical renal disease -Continue with bladder scan qshift and I/o caths if >300cc. If pt requires I/O cath x24hrs, would place indwelling cath at that time -Continued on d5w at 100cc/hr. Serial sodium reviewed, trending down -repeat bmet in AM  Anemia of chronic disease > History of this hemoglobin is currently 0.4 which is a little bit down from his previous baseline a couple years ago - Continue iron and folate supplementation - Repeat cbc in AM  Hyperlipidemia History of CVA - Continue home aspirin and rosuvastatin as tolerated  Diabetes >  Presenting glucose 274  - Continue SSI coverage as needed  Hypertension - Continued on home amlodipine, Imdur, carvedilol -Hold home hydralazine for now -Will continue on prn hydralazine  Troponinemia > Troponin initially elevated to 85 with 80 on repeat.  Due to increase in ST changes due to LVH with repolarization abnormality, EDP spoke with on-call STEMI provider who agreed that the changes continue to be due to LVH.  Legally blind in both eyes - Continue home eyedrops  DVT prophylaxis: heparin subq Code Status: Full Family Communication: Pt in room, family not at bedside  Status is: Observation  The patient will require care spanning > 2 midnights and should be moved to inpatient because: Persistent severe electrolyte disturbances, Unsafe d/c plan and Inpatient level of care appropriate due to severity of illness  Dispo: The patient is from: SNF              Anticipated d/c is to: SNF              Anticipated d/c date is: > 3 days              Patient currently is not medically stable to d/c.   Difficult to place patient No    Consultants:      Procedures:     Antimicrobials: Anti-infectives (From admission, onward)   None       Subjective: Unable to assess given current mentation  Objective: Vitals:   06/17/20 1505 06/17/20 1530 06/17/20 1600 06/17/20 1800  BP: (!) 170/65 (!) 159/59 (!) 159/55 134/66  Pulse: 72 70 69 68  Resp: '15 14 15 15  '$ Temp:   97.7  F (36.5 C) (!) 97.5 F (36.4 C)  TempSrc:   Axillary Axillary  SpO2: 99% 100% 100% 100%  Weight:      Height:       No intake or output data in the 24 hours ending 06/17/20 1837 Filed Weights   06/16/20 1414  Weight: 49.4 kg    Examination:  General exam: Appears calm and comfortable  Respiratory system: Respiratory effort normal, no audible wheezing Cardiovascular system: perfused, no le edema. Gastrointestinal system: nondistended, nontender Central nervous system: Alert and oriented.  No focal neurological deficits. Extremities: Symmetric 5 x 5 power. Skin: No rashes, lesions Psychiatry: unable to assess given mentation   Data Reviewed: I have personally reviewed following labs and imaging studies  CBC: Recent Labs  Lab 06/16/20 1344 06/16/20 1450 06/17/20 0603  WBC 12.8*  --  11.2*  HGB 8.4* 8.5* 8.8*  HCT 27.8* 25.0* 30.8*  MCV 83.7  --  86.5  PLT 244  --  [id number]   Basic Metabolic Panel: Recent Labs  Lab 06/16/20 1344 06/16/20 1450 06/16/20 1652 06/17/20 0030 06/17/20 0603 06/17/20 1158 06/17/20 1614  NA 157* 159*  --  159* 163* 158* 156*  K 4.9 4.6  --  4.6 4.7  --   --   CL 120*  --   --  123* 125*  --   --   CO2 24  --   --  22 24  --   --   GLUCOSE 274*  --   --  156* 161*  --   --   BUN 136*  --   --  121* 121*  --   --   CREATININE 7.88*  --   --  7.00* 6.88*  --   --   CALCIUM 8.1*  --   --  7.9* 8.3*  --   --   MG  --   --  2.7*  --   --   --   --   PHOS  --   --   --  6.6* 6.8*  --   --    GFR: Estimated Creatinine Clearance: 5.9 mL/min (A) (by C-G formula based on SCr of 6.88 mg/dL (H)). Liver Function Tests: Recent Labs  Lab 06/16/20 1344 06/17/20 0603  AST 12*  --   ALT 7  --   ALKPHOS 81  --   BILITOT 0.4  --   PROT 6.5  --   ALBUMIN 2.8* 2.7*   No results for input(s): LIPASE, AMYLASE in the last 168 hours. No results for input(s): AMMONIA in the last 168 hours. Coagulation Profile: No results for input(s): INR, PROTIME in the last 168 hours. Cardiac Enzymes: No results for input(s): CKTOTAL, CKMB, CKMBINDEX, TROPONINI in the last 168 hours. BNP (last 3 results) No results for input(s): PROBNP in the last 8760 hours. HbA1C: Recent Labs    06/16/20 1400  HGBA1C 6.6*   CBG: Recent Labs  Lab 06/16/20 2152 06/17/20 0458 06/17/20 0758 06/17/20 1206 06/17/20 1615  GLUCAP 126* 136* 129* 200* 203*   Lipid Profile: No results for input(s): CHOL, HDL, LDLCALC, TRIG, CHOLHDL, LDLDIRECT in the last 72 hours. Thyroid  Function Tests: Recent Labs    06/16/20 1400  TSH 2.271   Anemia Panel: No results for input(s): VITAMINB12, FOLATE, FERRITIN, TIBC, IRON, RETICCTPCT in the last 72 hours. Sepsis Labs: Recent Labs  Lab 06/16/20 1400  LATICACIDVEN 1.4    Recent Results (from the past 240 hour(s))  Culture, blood (  routine x 2)     Status: None (Preliminary result)   Collection Time: 06/16/20  2:00 PM   Specimen: BLOOD RIGHT ARM  Result Value Ref Range Status   Specimen Description BLOOD RIGHT ARM  Final   Special Requests   Final    BOTTLES DRAWN AEROBIC AND ANAEROBIC Blood Culture adequate volume   Culture   Final    NO GROWTH < 24 HOURS Performed at [facility], [place] [address] [place], [place] [address]    Report Status PENDING  Incomplete  SARS Coronavirus 2 by RT PCR (hospital order, performed in [facility] hospital lab) Nasopharyngeal Nasopharyngeal Swab     Status: None   Collection Time: 06/16/20  2:36 PM   Specimen: Nasopharyngeal Swab  Result Value Ref Range Status   SARS Coronavirus 2 NEGATIVE NEGATIVE Final    Comment: (NOTE) SARS-CoV-2 target nucleic acids are NOT DETECTED.  The SARS-CoV-2 RNA is generally detectable in upper and lower respiratory specimens during the acute phase of infection. The lowest concentration of SARS-CoV-2 viral copies this assay can detect is 250 copies / mL. A negative result does not preclude SARS-CoV-2 infection and should not be used as the sole basis for treatment or other patient management decisions.  A negative result may occur with improper specimen collection / handling, submission of specimen other than nasopharyngeal swab, presence of viral mutation(s) within the areas targeted by this assay, and inadequate number of viral copies (<250 copies / mL). A negative result must be combined with clinical observations, patient history, and epidemiological information.  Fact Sheet for Patients:    StrictlyIdeas.no  Fact Sheet for Healthcare Providers: BankingDealers.co.za  This test is not yet approved or  cleared by the [place] FDA and has been authorized for detection and/or diagnosis of SARS-CoV-2 by FDA under an Emergency Use Authorization (EUA).  This EUA will remain in effect (meaning this test can be used) for the duration of the COVID-19 declaration under Section 564(b)(1) of the Act, 21 U.S.C. section 360bbb-3(b)(1), unless the authorization is terminated or revoked sooner.  Performed at [facility], [place] [address] [place], [place] [address]   Culture, blood (routine x 2)     Status: None (Preliminary result)   Collection Time: 06/16/20  4:10 PM   Specimen: BLOOD  Result Value Ref Range Status   Specimen Description BLOOD SITE NOT SPECIFIED  Final   Special Requests   Final    BOTTLES DRAWN AEROBIC AND ANAEROBIC Blood Culture results may not be optimal due to an excessive volume of blood received in culture bottles   Culture   Final    NO GROWTH < 24 HOURS Performed at [facility], [place] [address], [place], [place] [address]    Report Status PENDING  Incomplete     Radiology Studies: US RENAL  Result Date: 06/16/2020 CLINICAL DATA:  Acute renal failure EXAM: RENAL / URINARY TRACT ULTRASOUND COMPLETE COMPARISON:  None. FINDINGS: Right Kidney: Renal measurements: 10.6 x 4.7 x 4.2 cm. = volume: 110 mL. Diffuse increased echogenicity is noted. No mass or hydronephrosis is noted. Left Kidney: Renal measurements: 8.6 x 5.0 x 4.0 cm = volume: 90 mL. Increased echogenicity is noted. No mass lesion or hydronephrosis is noted. Bladder: Appears normal for degree of bladder distention. Other: None. IMPRESSION: Changes consistent with medical renal disease. Electronically Signed   By: Inez Catalina M.D.   On: 06/16/2020 21:50   DG Chest [facility]  Result Date: 06/16/2020 CLINICAL DATA:  Altered mental status.  EXAM: PORTABLE CHEST 1 VIEW COMPARISON:  Radiograph 02/20/2018, CT 07/09/2017 FINDINGS: Chronic ill-defined opacity at the left lung base, but progressed from prior exam. Bronchiectasis noted on prior CT. No other focal airspace disease. The heart is normal in size normal mediastinal contours. No significant pleural effusion. No pulmonary edema. No pneumothorax. No acute osseous abnormalities are seen. IMPRESSION: Chronic but progressed left basilar opacity, corresponding to bronchiectasis and mucous plugging on prior CT. Electronically Signed   By: Keith Rake M.D.   On: 06/16/2020 16:47    Scheduled Meds: . amLODipine  10 mg Oral Daily  . aspirin  81 mg Oral Daily  . carvedilol  6.25 mg Oral BID WC  . dicyclomine  20 mg Oral Daily  . feeding supplement   Oral TID  . ferrous sulfate  325 mg Oral Daily  . folic acid  1 mg Oral Daily  . heparin  5,000 Units Subcutaneous Q8H  . hydroxypropyl methylcellulose / hypromellose  2 drop Both Eyes TID  . insulin aspart  0-5 Units Subcutaneous QHS  . insulin aspart  0-6 Units Subcutaneous TID WC  . isosorbide mononitrate  30 mg Oral QHS  . rosuvastatin  5 mg Oral QPM  . sodium chloride flush  3 mL Intravenous Q12H  . terazosin  5 mg Oral QHS   Continuous Infusions: . dextrose 100 mL/hr at 06/17/20 1618     LOS: 0 days   Marylu Lund, MD [facility] Pager On Amion  If 7PM-7AM, please contact night-coverage 06/17/2020, 6:37 PM

## 2020-06-17 NOTE — ED Notes (Signed)
Patient moved to bed 34, Report given to [facility].

## 2020-06-17 NOTE — TOC Initial Note (Signed)
Transition of Care [facility]) - Initial/Assessment Note    Patient Details  Name: Timothy [name] MRN: [id number] Date of Birth: 1940/08/17  Transition of Care [facility]) CM/SW Contact:    Archie Endo, LCSW Phone Number: 06/17/2020, 2:52 PM  Clinical Narrative:                 W met with patient's social worker from [employer], Janie Morning [phone]) to discuss this patient. [name] reports she has been working with the patient for several years and assisted him in getting placed [facility] in 2017. [name] reports patient is now blind, demented, and very hard of hearing. [name] reports the patient is able to understand some [place]. [name] is requesting patient be placed in SNF for long term placement as he is unable to care for himself.  Expected Discharge Plan: [place] Barriers to Discharge: Continued Medical Work up,Insurance Authorization   Patient Goals and CMS Choice        Expected Discharge Plan and Services Expected Discharge Plan: [place] arrangements for the past 2 months: [place] ([place])                                      Prior Living Arrangements/Services Living arrangements for the past 2 months: [place] ([place]) Lives with:: Self Patient language and need for interpreter reviewed:: Yes Do you feel safe going back to the place where you live?: Yes      Need for Family Participation in Patient Care: Yes (Comment) Care giver support system in place?: No (comment)   Criminal Activity/Legal Involvement Pertinent to Current Situation/Hospitalization: No - Comment as needed  Activities of Daily Living      Permission Sought/Granted Permission sought to share information with : Case Manager    Share Information with NAME: Janie Morning, MSW  Permission granted to share info w AGENCY: [employer]  Permission granted to share info  w Relationship: Social Worker  Permission granted to share info w Contact Information: [phone]  Emotional Assessment Appearance:: Appears stated age Attitude/Demeanor/Rapport: Unable to Assess Affect (typically observed): Unable to Assess Orientation: : Fluctuating Orientation (Suspected and/or reported [place]) (Demented, very hard of hearing, and visually impaired) Alcohol / Substance Use: Not Applicable Psych Involvement: No (comment)  Admission diagnosis:  Acute metabolic encephalopathy [D66.44] Patient Active Problem List   Diagnosis Date Noted  . Acute metabolic encephalopathy 03/47/4259  . Uremia 06/16/2020  . Hypernatremia 06/16/2020  . Dyslipidemia 04/12/2018  . Dementia ([place])   . Anemia   . TIA (transient ischemic attack) 02/20/2018  . CKD (chronic kidney disease), stage III ([place]) 01/20/2018  . History of CVA (cerebrovascular accident) 01/18/2018  . Stroke ([place]) 01/17/2018  . Hypertension 06/26/2017  . Acute kidney injury superimposed on chronic kidney disease ([place]) 06/25/2017  . 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 Left retinal detachment   . 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 Bilateral cataracts 04/08/2017  . DM (diabetes mellitus), type 2 with renal complications ([place])   . Alcohol abuse   . Poor vision   . Anemia of chronic disease 02/13/2017  . Diffuse brain atrophy ([place]) 02/13/2017  . Epigastric pain 02/11/2017  . Fall at home 11/16/2013  . Closed TBI (traumatic brain injury) ([place]) 11/16/2013  . SDH (subdural hematoma) ([place]) 11/15/2013  PCP:  [name], PA-C Pharmacy:  No Pharmacies Listed    Social Determinants of Health (SDOH) Interventions    Readmission Risk Interventions No flowsheet data found.
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
## 2020-06-17 NOTE — ED Notes (Signed)
Tele Breakfast order placed 

## 2020-06-17 NOTE — Progress Notes (Addendum)
CSW completed assessment with patient's community social worker Janie Morning at [phone].  Madilyn Fireman, MSW, LCSW-A Transitions of Care  Clinical Social Worker I [phone]

## 2020-06-17 NOTE — ED Notes (Signed)
pts social worker asking for case worker or social worker here at hospital to help find new placement for pt. Notified case worker at hospital.

## 2020-06-17 NOTE — NC FL2 (Signed)
[place] LEVEL OF CARE SCREENING TOOL     IDENTIFICATION  Patient Name: Timothy [name] Birthdate: [birth year] Sex: male Admission Date (Current Location): 06/16/2020  [facility] and [place] Number:  Herbalist and Address:         Provider Number: [id number]  Attending Physician Name and Address:  Donne Hazel, MD  Relative Name and Phone Number:  Janie Morning, MSW [phone]    Current Level of Care: Hospital Recommended Level of Care: [place] Prior Approval Number:    Date Approved/Denied:   PASRR Number: [id number]  Discharge Plan: SNF    Current Diagnoses: Patient Active Problem List   Diagnosis Date Noted  . Acute metabolic encephalopathy [id number]  . Uremia 06/16/2020  . Hypernatremia 06/16/2020  . Dyslipidemia 04/12/2018  . Dementia ([place])   . Anemia   . TIA (transient ischemic attack) 02/20/2018  . CKD (chronic kidney disease), stage III ([place]) 01/20/2018  . History of CVA (cerebrovascular accident) 01/18/2018  . Stroke ([place]) 01/17/2018  . Hypertension 06/26/2017  . Acute kidney injury superimposed on chronic kidney disease ([place]) 06/25/2017  . 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 Left retinal detachment   . 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 Bilateral cataracts 04/08/2017  . DM (diabetes mellitus), type 2 with renal complications ([place])   . Alcohol abuse   . Poor vision   . Anemia of chronic disease 02/13/2017  . Diffuse brain atrophy ([place]) 02/13/2017  . Epigastric pain 02/11/2017  . Fall at home 11/16/2013  . Closed TBI (traumatic brain injury) ([place]) 11/16/2013  . SDH (subdural hematoma) (H[facility]) 11/15/2013    Orientation RESPIRATION BLADDER Height & Weight      (Demented)  Normal Incontinent Weight: 108 lb 14.5 oz (49.4 kg) Height:  '5\' 2"'$  (157.5 cm)  BEHAVIORAL SYMPTOMS/MOOD NEUROLOGICAL BOWEL NUTRITION STATUS      Incontinent Diet  AMBULATORY STATUS COMMUNICATION OF  NEEDS Skin   Extensive Assist Non-Verbally Normal                       Personal Care Assistance Level of Assistance  Bathing,Dressing,Feeding Bathing Assistance: Maximum assistance Feeding assistance: Maximum assistance Dressing Assistance: Maximum assistance     Functional Limitations Info  Sight,Speech,Hearing Sight Info: Impaired Hearing Info: Impaired Speech Info: Impaired    SPECIAL CARE FACTORS FREQUENCY  PT (By licensed PT),OT (By licensed OT)     PT Frequency: 5x weekly OT Frequency: 5x weekly            Contractures Contractures Info: Not present    Additional Factors Info  Insulin Sliding Scale,Code Status,Allergies Code Status Info: Full Allergies Info: None   Insulin Sliding Scale Info: Novolog       Current Medications (06/17/2020):  This is the current hospital active medication list Current Facility-Administered Medications  Medication Dose Route Frequency Provider Last Rate Last Admin  . acetaminophen (TYLENOL) tablet 650 mg  650 mg Oral Q6H PRN Marcelyn Bruins, MD       Or  . acetaminophen (TYLENOL) suppository 650 mg  650 mg Rectal Q6H PRN Marcelyn Bruins, MD      . amLODipine (NORVASC) tablet 10 mg  10 mg Oral Daily Marcelyn Bruins, MD      . aspirin chewable tablet 81 mg  81 mg Oral Daily Marcelyn Bruins, MD      . carvedilol (COREG) tablet 6.25 mg  6.25 mg Oral BID WC Trilby Drummer,  Candace Gallus, MD      . dicyclomine (BENTYL) tablet 20 mg  20 mg Oral Daily Marcelyn Bruins, MD      . feeding supplement (ENSURE ENLIVE / ENSURE PLUS) liquid   Oral TID Marcelyn Bruins, MD      . ferrous sulfate tablet 325 mg  325 mg Oral Daily Marcelyn Bruins, MD      . folic acid (FOLVITE) tablet 1 mg  1 mg Oral Daily Marcelyn Bruins, MD      . heparin injection 5,000 Units  5,000 Units Subcutaneous Q8H Marcelyn Bruins, MD   5,000 Units at 06/17/20 1438  . hydroxypropyl methylcellulose / hypromellose (ISOPTO TEARS / GONIOVISC)  2.5 % ophthalmic solution 2 drop  2 drop Both Eyes TID Marcelyn Bruins, MD      . insulin aspart (novoLOG) injection 0-5 Units  0-5 Units Subcutaneous QHS Marcelyn Bruins, MD      . insulin aspart (novoLOG) injection 0-6 Units  0-6 Units Subcutaneous TID WC Marcelyn Bruins, MD   1 Units at 06/17/20 1210  . isosorbide mononitrate (IMDUR) 24 hr tablet 30 mg  30 mg Oral QHS Marcelyn Bruins, MD      . loperamide (IMODIUM) capsule 2 mg  2 mg Oral PRN Marcelyn Bruins, MD      . polyethylene glycol (MIRALAX / GLYCOLAX) packet 17 g  17 g Oral Daily PRN Marcelyn Bruins, MD      . rosuvastatin (CRESTOR) tablet 5 mg  5 mg Oral QPM Marcelyn Bruins, MD      . sodium chloride flush (NS) 0.9 % injection 3 mL  3 mL Intravenous Q12H Marcelyn Bruins, MD   3 mL at 06/17/20 1020  . terazosin (HYTRIN) capsule 5 mg  5 mg Oral QHS Marcelyn Bruins, MD   5 mg at 06/16/20 2221   Current Outpatient Medications  Medication Sig Dispense Refill  . amLODipine (NORVASC) 10 MG tablet Take 1 tablet (10 mg total) by mouth daily. 90 tablet 1  . aspirin 81 MG chewable tablet Chew 1 tablet (81 mg total) by mouth daily. 90 tablet 1  . Carboxymethylcellulose Sodium (REFRESH TEARS OP) Place 1 drop into both eyes 3 (three) times daily.    . carvedilol (COREG) 6.25 MG tablet Take 1 tablet (6.25 mg total) by mouth 2 (two) times daily with a meal. 180 tablet 1  . dicyclomine (BENTYL) 20 MG tablet Take 20 mg by mouth daily.    . ferrous sulfate 325 (65 FE) MG tablet Take 325 mg by mouth daily.    . folic acid (FOLVITE) 1 MG tablet Take 1 tablet (1 mg total) by mouth daily. 90 tablet 1  . hydrALAZINE (APRESOLINE) 50 MG tablet Take 1 tablet (50 mg total) by mouth 3 (three) times daily. 270 tablet 1  . isosorbide mononitrate (IMDUR) 30 MG 24 hr tablet Take 1 tablet (30 mg total) by mouth at bedtime. 90 tablet 1  . loperamide (IMODIUM) 2 MG capsule Take 2-4 mg by mouth See admin instructions. Take 2 capsules  (4 mg) by mouth as needed after loose bowel movement, then take 1 capsule (2 mg) after each additional loose bowel movement (max 8 capsules (16 mg) in 24 hours    . Nutritional Supplements (NUTRITIONAL SUPPLEMENT PO) Take 1 each by mouth 3 (three) times daily. Mighty Shake    . rosuvastatin (CRESTOR) 5 MG tablet Take 1 tablet (5 mg total) by  mouth every evening. 90 tablet 1  . sodium bicarbonate 650 MG tablet Take 650 mg by mouth 3 (three) times daily.    Marland Kitchen terazosin (HYTRIN) 5 MG capsule Take 1 capsule (5 mg total) by mouth at bedtime. 90 capsule 1     Discharge Medications: Please see discharge summary for a list of discharge medications.  Relevant Imaging Results:  Relevant Lab Results:   Additional Information SSN: [id number]  Archie Endo, LCSW

## 2020-06-17 NOTE — ED Notes (Signed)
Patient requesting water. Swallow screen failed. Will keep npo. Dr. Wyline Copas made aware and speech consult requested. Will continue to monitor.

## 2020-06-17 NOTE — Progress Notes (Signed)
Overnight update  Sodium not improving. Now 159 from 157. Some improvement in BUN and Cr. - Increase D5W rate to 100 mL per hour - Discontinue sodium bicarb tablets

## 2020-06-17 NOTE — ED Notes (Signed)
Spoke with Doc. Will start fluids that where ordered at midnight and give insulin. Recheck sodium at 12pm.

## 2020-06-18 DIAGNOSIS — R4182 Altered mental status, unspecified: Secondary | ICD-10-CM | POA: Diagnosis not present

## 2020-06-18 DIAGNOSIS — D638 Anemia in other chronic diseases classified elsewhere: Secondary | ICD-10-CM

## 2020-06-18 DIAGNOSIS — G9341 Metabolic encephalopathy: Secondary | ICD-10-CM | POA: Diagnosis not present

## 2020-06-18 DIAGNOSIS — N171 Acute kidney failure with acute cortical necrosis: Secondary | ICD-10-CM

## 2020-06-18 LAB — RENAL FUNCTION PANEL
Albumin: 2.2 g/dL — ABNORMAL LOW (ref 3.5–5.0)
Anion gap: 11 (ref 5–15)
BUN: 102 mg/dL — ABNORMAL HIGH (ref 8–23)
CO2: 23 mmol/L (ref 22–32)
Calcium: 7.8 mg/dL — ABNORMAL LOW (ref 8.9–10.3)
Chloride: 121 mmol/L — ABNORMAL HIGH (ref 98–111)
Creatinine, Ser: 6.22 mg/dL — ABNORMAL HIGH (ref 0.61–1.24)
GFR, Estimated: 8 mL/min — ABNORMAL LOW (ref >60)
Glucose, Bld: 229 mg/dL — ABNORMAL HIGH (ref 70–99)
Phosphorus: 5.6 mg/dL — ABNORMAL HIGH (ref 2.5–4.6)
Potassium: 4.2 mmol/L (ref 3.5–5.1)
Sodium: 155 mmol/L — ABNORMAL HIGH (ref 135–145)

## 2020-06-18 LAB — SODIUM
Sodium: 152 mmol/L — ABNORMAL HIGH (ref 135–145)
Sodium: 155 mmol/L — ABNORMAL HIGH (ref 135–145)
Sodium: 152 mmol/L — ABNORMAL HIGH (ref 135–145)
Sodium: 151 mmol/L — ABNORMAL HIGH (ref 135–145)

## 2020-06-18 LAB — GLUCOSE, CAPILLARY
Glucose-Capillary: 256 mg/dL — ABNORMAL HIGH (ref 70–99)
Glucose-Capillary: 64 mg/dL — ABNORMAL LOW (ref 70–99)
Glucose-Capillary: 76 mg/dL (ref 70–99)
Glucose-Capillary: 149 mg/dL — ABNORMAL HIGH (ref 70–99)

## 2020-06-18 MED ORDER — DEXTROSE 5 % IV SOLN: INTRAVENOUS | Status: DC

## 2020-06-18 NOTE — Progress Notes (Signed)
Inpatient Diabetes Program Recommendations  AACE/ADA: New Consensus Statement on Inpatient Glycemic Control (2015)  Target Ranges:  Prepandial:   less than 140 mg/dL      Peak postprandial:   less than 180 mg/dL (1-2 hours)      Critically ill patients:  140 - 180 mg/dL   Lab Results  Component Value Date   GLUCAP 76 06/18/2020   HGBA1C 6.6 (H) 06/16/2020    Review of Glycemic Control Results for Timothy [name], Timothy [name] (MRN [id number]) as of 06/18/2020 12:40  Ref. Range 06/18/2020 08:28 06/18/2020 12:01 06/18/2020 12:40  Glucose-Capillary Latest Ref Range: 70 - 99 mg/dL 256 (H) 64 (L) 76   Diabetes history: Type 2 DM Outpatient Diabetes medications: none Current orders for Inpatient glycemic control: Novolog 0-6 units TID & HS  Inpatient Diabetes Program Recommendations:    Noted hypoglycemia following Novolog 2 units of correction. Given patient's mental status, age and renal status would consider discontinuing Novolog 0-5 units QHS.   Thanks, Bronson Curb, MSN, RNC-OB Diabetes Coordinator [phone] (8a-5p)

## 2020-06-18 NOTE — Evaluation (Addendum)
Clinical/Bedside Swallow Evaluation Patient Details  Name: Timothy [name] MRN: [id number] Date of Birth: [birth year]  Today's Date: 06/18/2020 Time: SLP Start Time (ACUTE ONLY): 0836 SLP Stop Time (ACUTE ONLY): 0850 SLP Time Calculation (min) (ACUTE ONLY): 14 min  Past Medical History:  Past Medical History:  Diagnosis Date  . Alcohol abuse    Patient denies, but was shared with people from [facility] by his male roommate.  . Anemia    chronic disease/renal disease  . Bilateral cataracts 04/08/2017  . Dementia ([place])   . DM (diabetes mellitus), type 2 with renal complications ([place])    uncertain when diagnosed  . HOH (hard of hearing)   . Hypertension   . Legally blind    B/L  . Stroke [facility]) [id number]   Embolic   Past Surgical History:  Past Surgical History:  Procedure Laterality Date  . MEMBRANE PEEL Right 09/29/2017   Procedure: MEMBRANE PEEL WITH AIR GAS;  Surgeon: Jalene Mullet, MD;  Location: [place];  Service: Ophthalmology;  Laterality: Right;  . PARS PLANA VITRECTOMY Left 06/29/2017   Procedure: PARS PLANA VITRECTOMY WITH 25 GAUGE AND MEMBRANE PEEL WITH GAS EXCHANGE, AIR SILICONE OIL AND PHACO COAGULATION, LASER;  Surgeon: Jalene Mullet, MD;  Location: [place];  Service: Ophthalmology;  Laterality: Left;  . PARS PLANA VITRECTOMY Right 09/18/2017   Procedure: PARS PLANA VITRECTOMY WITH 25 GAUGE MEMBRANE PILL WITH AIR GAS SILICONE OIL AND PHOTOCOAGULATION;  Surgeon: Jalene Mullet, MD;  Location: [place];  Service: Ophthalmology;  Laterality: Right;  . PARS PLANA VITRECTOMY Right 09/29/2017   Procedure: PARS PLANA VITRECTOMY WITH 25 GAUGE;  Surgeon: Jalene Mullet, MD;  Location: [place];  Service: Ophthalmology;  Laterality: Right;  . PHOTOCOAGULATION Right 09/29/2017   Procedure: PHOTOCOAGULATION;  Surgeon: Jalene Mullet, MD;  Location: [place];  Service: Ophthalmology;  Laterality: Right;  . 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 male with medical history significant of CKD 3, alcohol use, anemia of chronic disease, TBI, CVA, dementia, diabetes, hyperlipidemia, hypertension, legally blind in both eyes who presented with reports of increased somnolence at his nursing facility. Pt's native tongue is [place] and he speaks some English per reports. CXR 1/29: Chronic but progressed left basilar opacity, corresponding to  bronchiectasis and mucous plugging on prior CT. BSE 02/21/2018 recommended a dyspahgia 2 diet with thin liquids.   Assessment / Plan / Recommendation Clinical Impression  Pt was seen for bedside swallow evaluation. The evaluation was completed without translation/interpreation due to the dialect of his language being unavailable and since pt was NPO pending the evaluation. However, he responded to some questions in [place]. Pt did not cooperate with oral mechanism exam and required encouragement to accept trials. Limited oral inspection revealed reduced/absent dentition and a dark lingual surface with dried secretions. Pt did not allow oral care. Pt tolerated puree solids and up to three consecutive swallows of thin liquids via straw without overt s/sx of aspiration and oral phase appeared [facility]. All other consistencies were refused despite encouragement. A dysphagia 1 diet with thin liquids will be initiated at this time. SLP will follow to assess diet tolerance and his ability to safely tolerate more advanced consistencies. SLP Visit Diagnosis: Dysphagia, unspecified (R13.10)    Aspiration Risk  Mild aspiration risk    Diet Recommendation Dysphagia 1 (  Puree);Thin liquid   Liquid Administration via: Cup;Straw Medication Administration: Crushed with puree Supervision: Staff to assist with self feeding;Full supervision/cueing for compensatory strategies Compensations:  Slow rate;Small sips/bites Postural Changes: Seated upright at 90 degrees    Other  Recommendations Oral Care Recommendations: Oral care BID   Follow up Recommendations None      Frequency and Duration min 2x/week  1 week       Prognosis Prognosis for Safe Diet Advancement: Good Barriers to Reach Goals: Cognitive deficits      Swallow Study   General Date of Onset: 06/17/20 HPI: Pt is an 80 y.o. male with medical history significant of CKD 3, alcohol use, anemia of chronic disease, TBI, CVA, dementia, diabetes, hyperlipidemia, hypertension, legally blind in both eyes who presented with reports of increased somnolence at his nursing facility. Pt's native tongue is [place] and he speaks some English per reports. CXR 1/29: Chronic but progressed left basilar opacity, corresponding to  bronchiectasis and mucous plugging on prior CT. BSE 02/21/2018 recommended a dyspahgia 2 diet with thin liquids. Type of Study: Bedside Swallow Evaluation Previous Swallow Assessment: See HPI Diet Prior to this Study: NPO Temperature Spikes Noted: No Respiratory Status: Room air History of Recent Intubation: No Behavior/Cognition: Alert;Requires cueing;Doesn't follow directions Oral Cavity Assessment: Dried secretions Oral Care Completed by SLP: No (Pt did not allow) Oral Cavity - Dentition:  (UTA; but appeared limited/absent) Vision: Impaired for self-feeding Self-Feeding Abilities: Total assist Patient Positioning: Upright in bed;Postural control adequate for testing Baseline Vocal Quality: Normal Volitional Cough: Cognitively unable to elicit Volitional Swallow: Unable to elicit    Oral/Motor/Sensory Function Overall Oral Motor/Sensory Function:  (UTA)   Ice Chips Ice chips: Not tested (pt refused)   Thin Liquid Thin Liquid: Within functional limits Presentation: Straw    Nectar Thick Nectar Thick Liquid: Not tested   Honey Thick Honey Thick Liquid: Not tested   Puree Puree: Within  functional limits Presentation: Spoon   Solid     Solid: Not tested (pt refused)     Timothy [name] I. Hardin Negus, [place], [place] Office number [phone] Pager [place] 06/18/2020,9:08 AM

## 2020-06-18 NOTE — Progress Notes (Signed)
PROGRESS NOTE    Timothy [name]  [id number] DOB: November 12, 1940 DOA: 06/16/2020 PCP: [name], PA-C    Brief Narrative:  80 y.o. male with medical history significant of CKD 3, alcohol use, anemia of chronic disease, TBI, CVA, dementia, diabetes, hyperlipidemia, hypertension, legally blind in both eyes who presents with reports of increased somnolence at his nursing facility.  Assessment & Plan:   Principal Problem:   Acute metabolic encephalopathy Active Problems:   Anemia of chronic disease   DM (diabetes mellitus), type 2 with renal complications (HCC)   Acute kidney injury superimposed on chronic kidney disease (HCC)   Legally blind   Hypertension   History of CVA (cerebrovascular accident)   Dementia ([place])   Dyslipidemia   Uremia   Hypernatremia   Dementia History of CVA and TBI Acute metabolic encephalopathy > Patient presented due to increased somnolence compared to his baseline per nursing home staff. > Suspect secondary to presenting hypernatremia and acute renal failure > Follow-up urinalysis, reviewed neg - Pt is continued on hypotonic fluids -Mentation seems improved this AM. Cont IVF as tolerated  Acute renal failure on CKD 3 Hypernatremia Uremia > Presenting sodium 137, BUN 136, creatinine 7.8 from a baseline of 2.32 years ago. > Concerns for obstructive uropathy with 700cc noted on presentation - Renal ultrasound reviewed, findings of medical renal disease -Continue with bladder scan q8h and I/o caths if >300cc. If pt requires I/O cath x24hrs, would place indwelling cath at that time -Continued on d5w at 100cc/hr. Serial sodium levels are trending down -Recheck bmet in AM  Anemia of chronic disease > History of this hemoglobin is currently 0.4 which is a little bit down from his previous baseline a couple years ago - Continue iron and folate supplementation - cont to follow cbc trends  Hyperlipidemia History of CVA - Continue home aspirin  and rosuvastatin as tolerated  Diabetes > Presenting glucose 274  - Continue SSI coverage -Diabetic coordinator following  Hypertension - Continued on home amlodipine, Imdur, carvedilol -Hold home hydralazine for now -Continue on PRN hydralazine  Troponinemia > Troponin initially elevated to 85 with 80 on repeat.  Due to increase in ST changes due to LVH with repolarization abnormality, EDP spoke with on-call STEMI provider who agreed that the changes continue to be due to LVH.  Legally blind in both eyes - Continue home eyedrops as needed  DVT prophylaxis: heparin subq Code Status: Full Family Communication: Pt in room, family not at bedside  Status is: Observation  The patient will require care spanning > 2 midnights and should be moved to inpatient because: Persistent severe electrolyte disturbances, Unsafe d/c plan and Inpatient level of care appropriate due to severity of illness  Dispo: The patient is from: SNF              Anticipated d/c is to: SNF              Anticipated d/c date is: > 3 days              Patient currently is not medically stable to d/c.   Difficult to place patient No   Consultants:      Procedures:     Antimicrobials: Anti-infectives (From admission, onward)   None      Subjective: Unable to assess given mentation, pt seems more alert this AM  Objective: Vitals:   06/17/20 2129 06/18/20 0423 06/18/20 0500 06/18/20 0800  BP: (!) 159/59 (!) 143/54  109/81  Pulse: 69  68  Resp: '18 17  14  '$ Temp: (!) 96.8 F (36 C) (!) 97.5 F (36.4 C)  98 F (36.7 C)  TempSrc:  Axillary  Axillary  SpO2: 100% 100%    Weight:   36.9 kg   Height:        Intake/Output Summary (Last 24 hours) at 06/18/2020 1530 Last data filed at 06/18/2020 0426 Gross per 24 hour  Intake 900.35 ml  Output --  Net 900.35 ml   Filed Weights   06/16/20 1414 06/18/20 0500  Weight: 49.4 kg 36.9 kg    Examination: General exam: Awake, laying in bed, in  nad Respiratory system: Normal respiratory effort, no audible wheezing Cardiovascular system: regular rate, s1, s2 Gastrointestinal system: Soft, nondistended, positive BS Central nervous system: CN2-12 grossly intact, strength intact Extremities: Perfused, no clubbing Skin: Poor skin turgor, no notable skin lesions seen Psychiatry: unable to assess given mentation  Data Reviewed: I have personally reviewed following labs and imaging studies  CBC: Recent Labs  Lab 06/16/20 1344 06/16/20 1450 06/17/20 0603  WBC 12.8*  --  11.2*  HGB 8.4* 8.5* 8.8*  HCT 27.8* 25.0* 30.8*  MCV 83.7  --  86.5  PLT 244  --  [id number]   Basic Metabolic Panel: Recent Labs  Lab 06/16/20 1344 06/16/20 1450 06/16/20 1652 06/17/20 0030 06/17/20 0603 06/17/20 1158 06/17/20 1614 06/17/20 2005 06/18/20 0107 06/18/20 0441 06/18/20 1241  NA 157* 159*  --  159* 163*   < > 156* 157* 155* 152* 155*  K 4.9 4.6  --  4.6 4.7  --   --   --  4.2  --   --   CL 120*  --   --  123* 125*  --   --   --  121*  --   --   CO2 24  --   --  22 24  --   --   --  23  --   --   GLUCOSE 274*  --   --  156* 161*  --   --   --  229*  --   --   BUN 136*  --   --  121* 121*  --   --   --  102*  --   --   CREATININE 7.88*  --   --  7.00* 6.88*  --   --   --  6.22*  --   --   CALCIUM 8.1*  --   --  7.9* 8.3*  --   --   --  7.8*  --   --   MG  --   --  2.7*  --   --   --   --   --   --   --   --   PHOS  --   --   --  6.6* 6.8*  --   --   --  5.6*  --   --    < > = values in this interval not displayed.   GFR: Estimated Creatinine Clearance: 4.9 mL/min (A) (by C-G formula based on SCr of 6.22 mg/dL (H)). Liver Function Tests: Recent Labs  Lab 06/16/20 1344 06/17/20 0603 06/18/20 0107  AST 12*  --   --   ALT 7  --   --   ALKPHOS 81  --   --   BILITOT 0.4  --   --   PROT 6.5  --   --   ALBUMIN  2.8* 2.7* 2.2*   No results for input(s): LIPASE, AMYLASE in the last 168 hours. No results for input(s): AMMONIA in the last 168  hours. Coagulation Profile: No results for input(s): INR, PROTIME in the last 168 hours. Cardiac Enzymes: No results for input(s): CKTOTAL, CKMB, CKMBINDEX, TROPONINI in the last 168 hours. BNP (last 3 results) No results for input(s): PROBNP in the last 8760 hours. HbA1C: Recent Labs    06/16/20 1400  HGBA1C 6.6*   CBG: Recent Labs  Lab 06/17/20 1206 06/17/20 1615 06/18/20 0828 06/18/20 1201 06/18/20 1240  GLUCAP 200* 203* 256* 64* 76   Lipid Profile: No results for input(s): CHOL, HDL, LDLCALC, TRIG, CHOLHDL, LDLDIRECT in the last 72 hours. Thyroid Function Tests: Recent Labs    06/16/20 1400  TSH 2.271   Anemia Panel: No results for input(s): VITAMINB12, FOLATE, FERRITIN, TIBC, IRON, RETICCTPCT in the last 72 hours. Sepsis Labs: Recent Labs  Lab 06/16/20 1400  LATICACIDVEN 1.4    Recent Results (from the past 240 hour(s))  Culture, blood (routine x 2)     Status: None (Preliminary result)   Collection Time: 06/16/20  2:00 PM   Specimen: BLOOD RIGHT ARM  Result Value Ref Range Status   Specimen Description BLOOD RIGHT ARM  Final   Special Requests   Final    BOTTLES DRAWN AEROBIC AND ANAEROBIC Blood Culture adequate volume   Culture   Final    NO GROWTH 2 DAYS Performed at [facility], [place] [address], [place], [place] [address]    Report Status PENDING  Incomplete  SARS Coronavirus 2 by RT PCR (hospital order, performed in [facility] hospital lab) Nasopharyngeal Nasopharyngeal Swab     Status: None   Collection Time: 06/16/20  2:36 PM   Specimen: Nasopharyngeal Swab  Result Value Ref Range Status   SARS Coronavirus 2 NEGATIVE NEGATIVE Final    Comment: (NOTE) SARS-CoV-2 target nucleic acids are NOT DETECTED.  The SARS-CoV-2 RNA is generally detectable in upper and lower respiratory specimens during the acute phase of infection. The lowest concentration of SARS-CoV-2 viral copies this assay can detect is 250 copies / mL. A negative result does  not preclude SARS-CoV-2 infection and should not be used as the sole basis for treatment or other patient management decisions.  A negative result may occur with improper specimen collection / handling, submission of specimen other than nasopharyngeal swab, presence of viral mutation(s) within the areas targeted by this assay, and inadequate number of viral copies (<250 copies / mL). A negative result must be combined with clinical observations, patient history, and epidemiological information.  Fact Sheet for Patients:   StrictlyIdeas.no  Fact Sheet for Healthcare Providers: BankingDealers.co.za  This test is not yet approved or  cleared by the [place] FDA and has been authorized for detection and/or diagnosis of SARS-CoV-2 by FDA under an Emergency Use Authorization (EUA).  This EUA will remain in effect (meaning this test can be used) for the duration of the COVID-19 declaration under Section 564(b)(1) of the Act, 21 U.S.C. section 360bbb-3(b)(1), unless the authorization is terminated or revoked sooner.  Performed at [facility], [place] [address] [place], [place] [address]   Culture, blood (routine x 2)     Status: None (Preliminary result)   Collection Time: 06/16/20  4:10 PM   Specimen: BLOOD  Result Value Ref Range Status   Specimen Description BLOOD SITE NOT SPECIFIED  Final   Special Requests   Final  BOTTLES DRAWN AEROBIC AND ANAEROBIC Blood Culture results may not be optimal due to an excessive volume of blood received in culture bottles   Culture   Final    NO GROWTH 2 DAYS Performed at [place] [address] [place],  [address]    Report Status PENDING  Incomplete     Radiology Studies: US RENAL  Result Date: 06/16/2020 CLINICAL DATA:  Acute renal failure EXAM: RENAL / URINARY TRACT ULTRASOUND COMPLETE COMPARISON:  None. FINDINGS: Right Kidney: Renal measurements: 10.6 x 4.7 x 4.2  cm. = volume: 110 mL. Diffuse increased echogenicity is noted. No mass or hydronephrosis is noted. Left Kidney: Renal measurements: 8.6 x 5.0 x 4.0 cm = volume: 90 mL. Increased echogenicity is noted. No mass lesion or hydronephrosis is noted. Bladder: Appears normal for degree of bladder distention. Other: None. IMPRESSION: Changes consistent with medical renal disease. Electronically Signed   By: Inez Catalina M.D.   On: 06/16/2020 21:50   DG Chest Port 1 View  Result Date: 06/16/2020 CLINICAL DATA:  Altered mental status. EXAM: PORTABLE CHEST 1 VIEW COMPARISON:  Radiograph 02/20/2018, CT 07/09/2017 FINDINGS: Chronic ill-defined opacity at the left lung base, but progressed from prior exam. Bronchiectasis noted on prior CT. No other focal airspace disease. The heart is normal in size normal mediastinal contours. No significant pleural effusion. No pulmonary edema. No pneumothorax. No acute osseous abnormalities are seen. IMPRESSION: Chronic but progressed left basilar opacity, corresponding to bronchiectasis and mucous plugging on prior CT. Electronically Signed   By: Keith Rake M.D.   On: 06/16/2020 16:47    Scheduled Meds: . amLODipine  10 mg Oral Daily  . aspirin  81 mg Oral Daily  . carvedilol  6.25 mg Oral BID WC  . dicyclomine  20 mg Oral Daily  . feeding supplement   Oral TID  . ferrous sulfate  325 mg Oral Daily  . folic acid  1 mg Oral Daily  . heparin  5,000 Units Subcutaneous Q8H  . hydroxypropyl methylcellulose / hypromellose  2 drop Both Eyes TID  . insulin aspart  0-5 Units Subcutaneous QHS  . isosorbide mononitrate  30 mg Oral QHS  . rosuvastatin  5 mg Oral QPM  . sodium chloride flush  3 mL Intravenous Q12H  . terazosin  5 mg Oral QHS   Continuous Infusions: . dextrose       LOS: 1 day   Marylu Lund, MD [facility] Pager On Amion  If 7PM-7AM, please contact night-coverage 06/18/2020, 3:30 PM

## 2020-06-19 DIAGNOSIS — N179 Acute kidney failure, unspecified: Secondary | ICD-10-CM | POA: Diagnosis not present

## 2020-06-19 DIAGNOSIS — N189 Chronic kidney disease, unspecified: Secondary | ICD-10-CM | POA: Diagnosis not present

## 2020-06-19 DIAGNOSIS — G9341 Metabolic encephalopathy: Secondary | ICD-10-CM | POA: Diagnosis not present

## 2020-06-19 LAB — RENAL FUNCTION PANEL
Albumin: 2.2 g/dL — ABNORMAL LOW (ref 3.5–5.0)
Anion gap: 11 (ref 5–15)
BUN: 88 mg/dL — ABNORMAL HIGH (ref 8–23)
CO2: 22 mmol/L (ref 22–32)
Calcium: 7.7 mg/dL — ABNORMAL LOW (ref 8.9–10.3)
Chloride: 116 mmol/L — ABNORMAL HIGH (ref 98–111)
Creatinine, Ser: 5.6 mg/dL — ABNORMAL HIGH (ref 0.61–1.24)
GFR, Estimated: 10 mL/min — ABNORMAL LOW (ref >60)
Glucose, Bld: 277 mg/dL — ABNORMAL HIGH (ref 70–99)
Phosphorus: 4.8 mg/dL — ABNORMAL HIGH (ref 2.5–4.6)
Potassium: 4.2 mmol/L (ref 3.5–5.1)
Sodium: 149 mmol/L — ABNORMAL HIGH (ref 135–145)

## 2020-06-19 LAB — GLUCOSE, CAPILLARY
Glucose-Capillary: 213 mg/dL — ABNORMAL HIGH (ref 70–99)
Glucose-Capillary: 248 mg/dL — ABNORMAL HIGH (ref 70–99)
Glucose-Capillary: 118 mg/dL — ABNORMAL HIGH (ref 70–99)
Glucose-Capillary: 143 mg/dL — ABNORMAL HIGH (ref 70–99)

## 2020-06-19 LAB — SODIUM
Sodium: 148 mmol/L — ABNORMAL HIGH (ref 135–145)
Sodium: 147 mmol/L — ABNORMAL HIGH (ref 135–145)
Sodium: 146 mmol/L — ABNORMAL HIGH (ref 135–145)
Sodium: 145 mmol/L (ref 135–145)
Sodium: 146 mmol/L — ABNORMAL HIGH (ref 135–145)

## 2020-06-19 MED ORDER — INSULIN ASPART 100 UNIT/ML ~~LOC~~ SOLN: 0.0000 [IU] | Freq: Three times a day (TID) | SUBCUTANEOUS | Status: DC

## 2020-06-19 MED ORDER — INSULIN ASPART 100 UNIT/ML ~~LOC~~ SOLN
0.0000 [IU] | Freq: Three times a day (TID) | SUBCUTANEOUS | Status: DC
  Administered 2020-06-19 – 2020-06-20 (×3): 2 [IU] via SUBCUTANEOUS
  Administered 2020-06-20 – 2020-06-21 (×2): 1 [IU] via SUBCUTANEOUS
  Administered 2020-06-21: 2 [IU] via SUBCUTANEOUS

## 2020-06-19 NOTE — Progress Notes (Signed)
Inpatient Diabetes Program Recommendations  AACE/ADA: New Consensus Statement on Inpatient Glycemic Control (2015)  Target Ranges:  Prepandial:   less than 140 mg/dL      Peak postprandial:   less than 180 mg/dL (1-2 hours)      Critically ill patients:  140 - 180 mg/dL   Lab Results  Component Value Date   GLUCAP 213 (H) 06/19/2020   HGBA1C 6.6 (H) 06/16/2020    Review of Glycemic Control Results for Timothy [name], Timothy [name] (MRN [id number]) as of 06/19/2020 10:20  Ref. Range 06/18/2020 12:01 06/18/2020 12:40 06/18/2020 16:38 06/19/2020 07:44  Glucose-Capillary Latest Ref Range: 70 - 99 mg/dL 64 (L) 76 149 (H) 213 (H)    Diabetes history: Type 2 DM Outpatient Diabetes medications: none Current orders for Inpatient glycemic control: Novolog 0-5 units HS  Inpatient Diabetes Program Recommendations:    Given patient's mental status, age and renal status would consider discontinuing Novolog 0-5 units QHS.  Could consider adding Novolog custom scale to start if CBGs >200 mg/dL.  Secure chat sent to md.   Thanks, Bronson Curb, MSN, RNC-OB Diabetes Coordinator [phone] (8a-5p)

## 2020-06-19 NOTE — Progress Notes (Signed)
PROGRESS NOTE    Timothy [name]  [id number] DOB: 1941-02-19 DOA: 06/16/2020 PCP: [name], PA-C    Brief Narrative:  80 y.o. male with medical history significant of CKD 3, alcohol use, anemia of chronic disease, TBI, CVA, dementia, diabetes, hyperlipidemia, hypertension, legally blind in both eyes who presents with reports of increased somnolence at his nursing facility.  Assessment & Plan:   Principal Problem:   Acute metabolic encephalopathy Active Problems:   Anemia of chronic disease   DM (diabetes mellitus), type 2 with renal complications (HCC)   Acute kidney injury superimposed on chronic kidney disease (HCC)   Legally blind   Hypertension   History of CVA (cerebrovascular accident)   Dementia ([place])   Dyslipidemia   Uremia   Hypernatremia   Dementia History of CVA and TBI Acute metabolic encephalopathy > Patient presented due to increased somnolence compared to his baseline per nursing home staff. > Suspect secondary to presenting hypernatremia and acute renal failure - Pt is continued on hypotonic fluids per below -Mentation now improved and pt is conversing appropriately  Acute renal failure on CKD 3 Hypernatremia Uremia > Presenting sodium 157, BUN 136, creatinine 7.8 from a baseline of 2.32 years ago. > Bladder scan with 700cc noted on presentation - Renal ultrasound reviewed, findings of medical renal disease -Pt presented clinically dehydrated with dry mucus membranes and poor skin turgor -Continue with bladder scan q8h and I/o caths if >300cc. If pt requires I/O cath x24hrs, would place indwelling cath at that time -Continued on d5w at 100cc/hr. Serial sodium levels are trending down -Pt is voiding. Cr is trending down to 5.6 today with IVF -Repeat bmet in AM  Anemia of chronic disease - Continue iron and folate supplementation - cont to follow cbc trends -Remains hemodynamically stable  Hyperlipidemia History of CVA - Continue home  aspirin and rosuvastatin as tolerated  Diabetes > Presenting glucose 274  - Continue SSI coverage -Diabetic coordinator following - Reduced SSI coverage to very sensitive without night coverage  Hypertension - Continued on home amlodipine, Imdur, carvedilol - BP is currently controlled on current regimen. As BP rises, would then resume home scheduled hydralazine -Continue on PRN hydralazine  Troponinemia > Troponin initially elevated to 85 with 80 on repeat.  Due to increase in ST changes due to LVH with repolarization abnormality, EDP spoke with on-call STEMI provider who agreed that the changes continue to be due to LVH. -Remains stable at this time  Legally blind in both eyes - Continue home eyedrops as needed  DVT prophylaxis: heparin subq Code Status: Full Family Communication: Pt in room, family not at bedside  Status is: Inpatient  Requires continued inpatient admission because: Persistent severe electrolyte disturbances, Unsafe d/c plan and Inpatient level of care appropriate due to severity of illness  Dispo: The patient is from: SNF              Anticipated d/c is to: SNF              Anticipated d/c date is: > 3 days              Patient currently is not medically stable to d/c.   Difficult to place patient No   Consultants:      Procedures:     Antimicrobials: Anti-infectives (From admission, onward)   None      Subjective: Reports feeing hungry. Acknowledges he is blind and needs assistance   Objective: Vitals:   06/18/20 2028 06/19/20 0500  06/19/20 0517 06/19/20 0700  BP: (!) 105/34  (!) 125/31 (!) 154/107  Pulse: 66  71 69  Resp: '20  18 18  '$ Temp: 98.1 F (36.7 C)  97.9 F (36.6 C) 97.9 F (36.6 C)  TempSrc: Axillary  Axillary Axillary  SpO2: 100%  96%   Weight:  37.7 kg    Height:        Intake/Output Summary (Last 24 hours) at 06/19/2020 1534 Last data filed at 06/19/2020 0341 Gross per 24 hour  Intake --  Output 997 ml  Net  -997 ml   Filed Weights   06/16/20 1414 06/18/20 0500 06/19/20 0500  Weight: 49.4 kg 36.9 kg 37.7 kg    Examination: General exam: Conversant, in no acute distress Respiratory system: normal chest rise, clear, no audible wheezing Cardiovascular system: regular rhythm, s1-s2 Gastrointestinal system: Nondistended, nontender, pos BS Central nervous system: No seizures, no tremors Extremities: No cyanosis, no joint deformities Skin: No rashes, no pallor Psychiatry: Affect normal // no auditory hallucinations   Data Reviewed: I have personally reviewed following labs and imaging studies  CBC: Recent Labs  Lab 06/16/20 1344 06/16/20 1450 06/17/20 0603  WBC 12.8*  --  11.2*  HGB 8.4* 8.5* 8.8*  HCT 27.8* 25.0* 30.8*  MCV 83.7  --  86.5  PLT 244  --  [id number]   Basic Metabolic Panel: Recent Labs  Lab 06/16/20 1344 06/16/20 1450 06/16/20 1652 06/17/20 0030 06/17/20 0603 06/17/20 1158 06/18/20 0107 06/18/20 0441 06/18/20 2144 06/19/20 0041 06/19/20 0327 06/19/20 0907 06/19/20 1339  NA 157* 159*  --  159* 163*   < > 155*   < > 151* 149* 148* 147* 146*  K 4.9 4.6  --  4.6 4.7  --  4.2  --   --  4.2  --   --   --   CL 120*  --   --  123* 125*  --  121*  --   --  116*  --   --   --   CO2 24  --   --  22 24  --  23  --   --  22  --   --   --   GLUCOSE 274*  --   --  156* 161*  --  229*  --   --  277*  --   --   --   BUN 136*  --   --  121* 121*  --  102*  --   --  88*  --   --   --   CREATININE 7.88*  --   --  7.00* 6.88*  --  6.22*  --   --  5.60*  --   --   --   CALCIUM 8.1*  --   --  7.9* 8.3*  --  7.8*  --   --  7.7*  --   --   --   MG  --   --  2.7*  --   --   --   --   --   --   --   --   --   --   PHOS  --   --   --  6.6* 6.8*  --  5.6*  --   --  4.8*  --   --   --    < > = values in this interval not displayed.   GFR: Estimated Creatinine Clearance: 5.5 mL/min (A) (by C-G formula based on  SCr of 5.6 mg/dL (H)). Liver Function Tests: Recent Labs  Lab 06/16/20 1344  06/17/20 0603 06/18/20 0107 06/19/20 0041  AST 12*  --   --   --   ALT 7  --   --   --   ALKPHOS 81  --   --   --   BILITOT 0.4  --   --   --   PROT 6.5  --   --   --   ALBUMIN 2.8* 2.7* 2.2* 2.2*   No results for input(s): LIPASE, AMYLASE in the last 168 hours. No results for input(s): AMMONIA in the last 168 hours. Coagulation Profile: No results for input(s): INR, PROTIME in the last 168 hours. Cardiac Enzymes: No results for input(s): CKTOTAL, CKMB, CKMBINDEX, TROPONINI in the last 168 hours. BNP (last 3 results) No results for input(s): PROBNP in the last 8760 hours. HbA1C: No results for input(s): HGBA1C in the last 72 hours. CBG: Recent Labs  Lab 06/18/20 1201 06/18/20 1240 06/18/20 1638 06/19/20 0744 06/19/20 1216  GLUCAP 64* 76 149* 213* 248*   Lipid Profile: No results for input(s): CHOL, HDL, LDLCALC, TRIG, CHOLHDL, LDLDIRECT in the last 72 hours. Thyroid Function Tests: No results for input(s): TSH, T4TOTAL, FREET4, T3FREE, THYROIDAB in the last 72 hours. Anemia Panel: No results for input(s): VITAMINB12, FOLATE, FERRITIN, TIBC, IRON, RETICCTPCT in the last 72 hours. Sepsis Labs: Recent Labs  Lab 06/16/20 1400  LATICACIDVEN 1.4    Recent Results (from the past 240 hour(s))  Culture, blood (routine x 2)     Status: None (Preliminary result)   Collection Time: 06/16/20  2:00 PM   Specimen: BLOOD RIGHT ARM  Result Value Ref Range Status   Specimen Description BLOOD RIGHT ARM  Final   Special Requests   Final    BOTTLES DRAWN AEROBIC AND ANAEROBIC Blood Culture adequate volume   Culture   Final    NO GROWTH 2 DAYS Performed at [facility], [place] [address], [place], [place] [address]    Report Status PENDING  Incomplete  SARS Coronavirus 2 by RT PCR (hospital order, performed in [facility] hospital lab) Nasopharyngeal Nasopharyngeal Swab     Status: None   Collection Time: 06/16/20  2:36 PM   Specimen: Nasopharyngeal Swab  Result Value Ref  Range Status   SARS Coronavirus 2 NEGATIVE NEGATIVE Final    Comment: (NOTE) SARS-CoV-2 target nucleic acids are NOT DETECTED.  The SARS-CoV-2 RNA is generally detectable in upper and lower respiratory specimens during the acute phase of infection. The lowest concentration of SARS-CoV-2 viral copies this assay can detect is 250 copies / mL. A negative result does not preclude SARS-CoV-2 infection and should not be used as the sole basis for treatment or other patient management decisions.  A negative result may occur with improper specimen collection / handling, submission of specimen other than nasopharyngeal swab, presence of viral mutation(s) within the areas targeted by this assay, and inadequate number of viral copies (<250 copies / mL). A negative result must be combined with clinical observations, patient history, and epidemiological information.  Fact Sheet for Patients:   StrictlyIdeas.no  Fact Sheet for Healthcare Providers: BankingDealers.co.za  This test is not yet approved or  cleared by the [place] FDA and has been authorized for detection and/or diagnosis of SARS-CoV-2 by FDA under an Emergency Use Authorization (EUA).  This EUA will remain in effect (meaning this test can be used) for the duration of the COVID-19 declaration  under Section 564(b)(1) of the Act, 21 U.S.C. section 360bbb-3(b)(1), unless the authorization is terminated or revoked sooner.  Performed at [facility], [place] [address], [place], [place] [address]   Culture, blood (routine x 2)     Status: None (Preliminary result)   Collection Time: 06/16/20  4:10 PM   Specimen: BLOOD  Result Value Ref Range Status   Specimen Description BLOOD SITE NOT SPECIFIED  Final   Special Requests   Final    BOTTLES DRAWN AEROBIC AND ANAEROBIC Blood Culture results may not be optimal due to an excessive volume of blood received in culture bottles   Culture    Final    NO GROWTH 2 DAYS Performed at [facility], [place] [address], [place], [place] [address]    Report Status PENDING  Incomplete     Radiology Studies: No results found.  Scheduled Meds: . amLODipine  10 mg Oral Daily  . aspirin  81 mg Oral Daily  . carvedilol  6.25 mg Oral BID WC  . dicyclomine  20 mg Oral Daily  . feeding supplement   Oral TID  . ferrous sulfate  325 mg Oral Daily  . folic acid  1 mg Oral Daily  . heparin  5,000 Units Subcutaneous Q8H  . hydroxypropyl methylcellulose / hypromellose  2 drop Both Eyes TID  . insulin aspart  0-6 Units Subcutaneous TID WC  . isosorbide mononitrate  30 mg Oral QHS  . rosuvastatin  5 mg Oral QPM  . sodium chloride flush  3 mL Intravenous Q12H  . terazosin  5 mg Oral QHS   Continuous Infusions: . dextrose 100 mL/hr at 06/18/20 1821     LOS: 2 days   Marylu Lund, MD [facility] Pager On Amion  If 7PM-7AM, please contact night-coverage 06/19/2020, 3:34 PM

## 2020-06-20 DIAGNOSIS — G9341 Metabolic encephalopathy: Secondary | ICD-10-CM | POA: Diagnosis not present

## 2020-06-20 DIAGNOSIS — N189 Chronic kidney disease, unspecified: Secondary | ICD-10-CM | POA: Diagnosis not present

## 2020-06-20 DIAGNOSIS — D638 Anemia in other chronic diseases classified elsewhere: Secondary | ICD-10-CM | POA: Diagnosis not present

## 2020-06-20 DIAGNOSIS — N179 Acute kidney failure, unspecified: Secondary | ICD-10-CM | POA: Diagnosis not present

## 2020-06-20 LAB — RENAL FUNCTION PANEL
Albumin: 2.1 g/dL — ABNORMAL LOW (ref 3.5–5.0)
Anion gap: 9 (ref 5–15)
BUN: 75 mg/dL — ABNORMAL HIGH (ref 8–23)
CO2: 22 mmol/L (ref 22–32)
Calcium: 7.4 mg/dL — ABNORMAL LOW (ref 8.9–10.3)
Chloride: 110 mmol/L (ref 98–111)
Creatinine, Ser: 4.75 mg/dL — ABNORMAL HIGH (ref 0.61–1.24)
GFR, Estimated: 12 mL/min — ABNORMAL LOW (ref >60)
Glucose, Bld: 245 mg/dL — ABNORMAL HIGH (ref 70–99)
Phosphorus: 3.9 mg/dL (ref 2.5–4.6)
Potassium: 4.3 mmol/L (ref 3.5–5.1)
Sodium: 141 mmol/L (ref 135–145)

## 2020-06-20 LAB — SODIUM
Sodium: 141 mmol/L (ref 135–145)
Sodium: 140 mmol/L (ref 135–145)
Sodium: 138 mmol/L (ref 135–145)

## 2020-06-20 LAB — GLUCOSE, CAPILLARY
Glucose-Capillary: 226 mg/dL — ABNORMAL HIGH (ref 70–99)
Glucose-Capillary: 237 mg/dL — ABNORMAL HIGH (ref 70–99)
Glucose-Capillary: 190 mg/dL — ABNORMAL HIGH (ref 70–99)
Glucose-Capillary: 153 mg/dL — ABNORMAL HIGH (ref 70–99)

## 2020-06-20 NOTE — Progress Notes (Signed)
  Speech Language Pathology Treatment: Dysphagia  Patient Details Name: Timothy [name] MRN: [id number] DOB: [birth year] Today's Date: 06/20/2020 Time: [id number] SLP Time Calculation (min) (ACUTE ONLY): 10 min  Assessment / Plan / Recommendation Clinical Impression  Pt was seen for dysphagia treatment. He was alert and cooperative during the session. Pt was able to follow some commands and responded to multiple questions in [place]. He tolerated puree, dysphagia 2, and dysphagia 3 solids without overt s/sx of aspiration. Mastication was [facility] for dysphagia 2, but prolonged for dyspahgia 3 secondary to edentulous status. Pt exhibited a delayed cough with 1/6 boluses of thin liquids when his positioning was suboptimal, but no additional difficulty was noted after repositioning. A dysphagia 2 diet with thin liquids is recommended at this time. SLP will continue to follow.    HPI HPI: Pt is an 80 y.o. male with medical history significant of CKD 3, alcohol use, anemia of chronic disease, TBI, CVA, dementia, diabetes, hyperlipidemia, hypertension, legally blind in both eyes who presented with reports of increased somnolence at his nursing facility. Pt's native tongue is [place] and he speaks some English per reports. CXR 1/29: Chronic but progressed left basilar opacity, corresponding to  bronchiectasis and mucous plugging on prior CT. BSE 02/21/2018 recommended a dyspahgia 2 diet with thin liquids.      SLP Plan  Continue with current plan of care       Recommendations  Diet recommendations: Dysphagia 2 (fine chop);Thin liquid Liquids provided via: Cup;Straw Medication Administration: Crushed with puree Supervision: Patient able to self feed;Staff to assist with self feeding Compensations: Slow rate;Small sips/bites                Oral Care Recommendations: Oral care BID Follow up Recommendations: None SLP Visit Diagnosis: Dysphagia, unspecified (R13.10) Plan: Continue with current plan of  care                Ellard Nan I. Hardin Negus, [place], [place] Office number [phone] Pager [phone]       Horton Marshall 06/20/2020, 9:59 AM

## 2020-06-20 NOTE — Evaluation (Signed)
Occupational Therapy Evaluation/Discharge Patient Details Name: Timothy [name] MRN: [id number] DOB: 1940/12/02 Today's Date: 06/20/2020    History of Present Illness 80 year old male with history of dementia, chronic kidney disease stage III, alcohol use, anemia of chronic disease, TBI, prior CVA, diabetes, hyperlipidemia, hypertension, legal blindness comes into the hospital with increased somnolence at his nursing home. Pt admitted for further work up.   Clinical Impression   PTA, pt admitted from SNF with diagnoses above. Pt A&Ox1 and unable to convey PLOF. Pt likely at baseline for ADLs/mobility. Pt overall Max A x 2 for bed mobility and Total A x 2 for sit to stand and pivot to chair. Pt with poor base of support in standing, requiring extensive assist to maintain upright posture. Pt Max A for simple grooming ADLs, Total A for UB/LB ADLs. When answering questions via interpreter, pt perseverating on addition of "I do not know. I am blind" throughout session. Pt with inconsistent following of commands, likely at baseline. Recommend DC back to SNF once medically stable. No further skilled OT services indicated at this time. OT to sign off. Please reconsult if needs change.     Follow Up Recommendations  SNF;Supervision/Assistance - 24 hour (return to SNF)    Equipment Recommendations  None recommended by OT    Recommendations for Other Services       Precautions / Restrictions Precautions Precautions: Fall;Other (comment) Precaution Comments: blindness Restrictions Weight Bearing Restrictions: No      Mobility Bed Mobility Overal bed mobility: Needs Assistance Bed Mobility: Supine to Sit     Supine to sit: Max assist;+2 for physical assistance;+2 for safety/equipment;HOB elevated     General bed mobility comments: Pt Max A x 2 for bed mobility to sit EOB. Pt initially reaching out with B UE to assist in sitting EOB but attempting to return to bed to sleep    Transfers Overall  transfer level: Needs assistance Equipment used: Rolling walker (2 wheeled) Transfers: Sit to/from [facility] Sit to Stand: Total assist;+2 physical assistance;+2 safety/equipment Stand pivot transfers: Total assist;+2 physical assistance;+2 safety/equipment       General transfer comment: Total A x 2 for sit to stand (used RW as pt reports walking previously though pt reaching out to therapist for support). Pt with forward flexed positioning, poor base of support with feet far forward in standing, requiring Total A x 2 to maintain standing for posterior hygiene. Pt Total A x 2 for pivot to char. Extensive hands on assist needed throughout to prevent fall    Balance Overall balance assessment: Needs assistance Sitting-balance support: Feet supported;Bilateral upper extremity supported Sitting balance-Leahy Scale: Poor Sitting balance - Comments: reliant on UE support, Min A from therapist though pt attempting to return to supine to sleep (likely not LOBs)   Standing balance support: Bilateral upper extremity supported;During functional activity Standing balance-Leahy Scale: Zero Standing balance comment: Total A to maintain standing balance                           ADL either performed or assessed with clinical judgement   ADL Overall ADL's : Needs assistance/impaired;At baseline Eating/Feeding: Maximal assistance;Bed level   Grooming: Maximal assistance;Bed level;Wash/dry face Grooming Details (indicate cue type and reason): when cued, did initiate to wipe face but was not thorough with task. Touched face with washcloth then reached arm out to hand it back Upper Body Bathing: Total assistance;Bed level   Lower Body Bathing: Total assistance;Bed  level   Upper Body Dressing : Total assistance;Bed level   Lower Body Dressing: Total assistance;Bed level Lower Body Dressing Details (indicate cue type and reason): Total A to fix socks that were coming off in  bed Toilet Transfer: Total assistance;+2 for physical assistance;+2 for safety/equipment Toilet Transfer Details (indicate cue type and reason): simulated to recliner Toileting- Clothing Manipulation and Hygiene: Total assistance;+2 for physical assistance;+2 for safety/equipment;Sit to/from stand Toileting - Clothing Manipulation Details (indicate cue type and reason): Total A x 2-3 for thorough cleaning after BM in standing. 2 person to hold pt up while 3rd person assisting with hygiene       General ADL Comments: Likely at baseline, likely ADLs at bed level at SNF     Vision Baseline Vision/History: Legally blind Vision Assessment?: Vision impaired- to be further tested in functional context Additional Comments: legally blind     Perception     Praxis      Pertinent Vitals/Pain Pain Assessment: Faces Faces Pain Scale: No hurt Pain Intervention(s): Monitored during session     Hand Dominance  (unsure)   Extremity/Trunk Assessment Upper Extremity Assessment Upper Extremity Assessment: Generalized weakness;Difficult to assess due to impaired cognition   Lower Extremity Assessment Lower Extremity Assessment: Defer to PT evaluation   Cervical / Trunk Assessment Cervical / Trunk Assessment: Kyphotic   Communication Communication Communication: Prefers language other than [place];Interpreter utilized Quarry manager, Leodis [place])   Cognition Arousal/Alertness: Awake/alert Behavior During Therapy: Restless;Flat affect Overall Cognitive Status: Impaired/Different from baseline Area of Impairment: Orientation;Attention;Memory;Following commands;Safety/judgement;Problem solving;Awareness                 Orientation Level: Disoriented to;Time;Place;Situation Current Attention Level: Sustained Memory: Decreased short-term memory Following Commands: Follows one step commands inconsistently Safety/Judgement: Decreased awareness of safety;Decreased awareness of  deficits Awareness: Intellectual Problem Solving: Difficulty sequencing;Requires verbal cues;Requires tactile cues;Slow processing General Comments: Pt A&Ox1. Reports he does not know where he is or where he came from (attempted to reorient appropriately). Pt perseverating on "I do not know; i am blind" when answering questions with interpreter. Inconsistent following of commands. incontinent of bowels on entry, attempting to lay back throughout session   General Comments       Exercises     Shoulder Instructions      Home Living Family/patient expects to be discharged to:: Skilled nursing facility                                 Additional Comments: Admitted from SNF      Prior Functioning/Environment Level of Independence: Needs assistance  Gait / Transfers Assistance Needed: pt reports being able to walk but likely not recently ADL's / Homemaking Assistance Needed: Pt reports unsure of level of assistance for ADLs   Comments: Pt with hx of dementia, poor historian. Unsure of true PLOF as pt perseverating on being blind and oriented to self only        OT Problem List:        OT Treatment/Interventions:      OT Goals(Current goals can be found in the care plan section) Acute Rehab OT Goals Patient Stated Goal: none stated OT Goal Formulation: Patient unable to participate in goal setting  OT Frequency:     Barriers to D/C:            Co-evaluation PT/OT/SLP Co-Evaluation/Treatment: Yes Reason for Co-Treatment: Necessary to address cognition/behavior during functional activity;For patient/therapist safety;To address functional/ADL  transfers   OT goals addressed during session: ADL's and self-care      AM-PAC OT "6 Clicks" Daily Activity     Outcome Measure Help from another person eating meals?: A Lot Help from another person taking care of personal grooming?: A Lot Help from another person toileting, which includes using toliet, bedpan, or  urinal?: Total Help from another person bathing (including washing, rinsing, drying)?: Total Help from another person to put on and taking off regular upper body clothing?: Total Help from another person to put on and taking off regular lower body clothing?: Total 6 Click Score: 8   End of Session Equipment Utilized During Treatment: Gait belt;Rolling walker Nurse Communication: Mobility status;Other (comment) (RN present during much of session)  Activity Tolerance: Other (comment) (limited by cognition) Patient left: in chair;with call bell/phone within reach;with chair alarm set  OT Visit Diagnosis: Muscle weakness (generalized) (M62.81);Other symptoms and signs involving cognitive function                Time: [id number] OT Time Calculation (min): 29 min Charges:  OT General Charges $OT Visit: 1 Visit OT Evaluation $OT Eval Moderate Complexity: 1 Mod  Layla Maw, OTR/L  Layla Maw 06/20/2020, 1:49 PM

## 2020-06-20 NOTE — Evaluation (Signed)
Physical Therapy Evaluation Patient Details Name: Timothy [name] MRN: [id number] DOB: June 05, 1940 Today's Date: 06/20/2020   History of Present Illness  80 year old male with history of dementia, chronic kidney disease stage III, alcohol use, anemia of chronic disease, TBI, prior CVA, diabetes, hyperlipidemia, hypertension, legal blindness comes into the hospital with increased somnolence at his nursing home. Pt admitted for further work up.  Clinical Impression   Patient evaluated by Physical Therapy with no further acute PT needs identified, as he is very likely at his functional baseline.  PTA, pt admitted from SNF; Pt A&Ox1 and unable to convey PLOF. Pt likely at baseline for ADLs/mobility. Pt overall Max A x 2 for bed mobility and Total A x 2 for sit to stand and pivot to chair. Pt with poor base of support in standing, requiring extensive assist to maintain upright posture; When answering questions via interpreter, pt perseverating on addition of "I do not know. I am blind" throughout session. Pt with inconsistent following of commands, likely at baseline. Recommend DC back to SNF once medically stable. See below for any follow-up Physical Therapy or equipment needs. PT is signing off. Thank you for this referral.     Follow Up Recommendations SNF    Equipment Recommendations  Wheelchair (measurements PT);Wheelchair cushion (measurements PT)    Recommendations for Other Services       Precautions / Restrictions Precautions Precautions: Fall;Other (comment) Precaution Comments: blindness Restrictions Weight Bearing Restrictions: No      Mobility  Bed Mobility Overal bed mobility: Needs Assistance Bed Mobility: Supine to Sit     Supine to sit: Max assist;+2 for physical assistance;+2 for safety/equipment;HOB elevated     General bed mobility comments: Pt Max A x 2 for bed mobility to sit EOB. Pt initially reaching out with B UE to assist in sitting EOB but attempting to return to  bed to sleep    Transfers Overall transfer level: Needs assistance Equipment used: Rolling walker (2 wheeled) Transfers: Sit to/from [facility] Sit to Stand: Total assist;+2 physical assistance;+2 safety/equipment Stand pivot transfers: Total assist;+2 physical assistance;+2 safety/equipment       General transfer comment: Total A x 2 for sit to stand (used RW as pt reports walking previously though pt reaching out to therapist for support). Pt with forward flexed positioning, poor base of support with feet far forward in standing, requiring Total A x 2 to maintain standing for posterior hygiene. Pt Total A x 2 for pivot to char. Extensive hands on assist needed throughout to prevent fall  Ambulation/Gait                Stairs            Wheelchair Mobility    Modified Rankin (Stroke Patients Only)       Balance Overall balance assessment: Needs assistance Sitting-balance support: Feet supported;Bilateral upper extremity supported Sitting balance-Leahy Scale: Poor Sitting balance - Comments: reliant on UE support, Min A from therapist though pt attempting to return to supine to sleep (likely not LOBs)   Standing balance support: Bilateral upper extremity supported;During functional activity Standing balance-Leahy Scale: Zero Standing balance comment: Total A to maintain standing balance                             Pertinent Vitals/Pain Pain Assessment: Faces Faces Pain Scale: No hurt Pain Intervention(s): Monitored during session    Home Living Family/patient expects to be discharged to::  Skilled nursing facility                 Additional Comments: Admitted from SNF    Prior Function Level of Independence: Needs assistance   Gait / Transfers Assistance Needed: pt reports being able to walk but likely not recently  ADL's / Homemaking Assistance Needed: Pt reports unsure of level of assistance for ADLs  Comments: Pt  with hx of dementia, poor historian. Unsure of true PLOF as pt perseverating on being blind and oriented to self only     Hand Dominance   Dominant Hand:  (unsure)    Extremity/Trunk Assessment   Upper Extremity Assessment Upper Extremity Assessment: Defer to OT evaluation    Lower Extremity Assessment Lower Extremity Assessment: Generalized weakness    Cervical / Trunk Assessment Cervical / Trunk Assessment: Kyphotic  Communication   Communication: Prefers language other than [place];Interpreter utilized [employer]ar[employer] manager, Leodis [place])  Cognition Arousal/Alertness: Awake/alert Behavior During Therapy: Restless;Flat affect Overall Cognitive Status: Impaired/Different from baseline Area of Impairment: Orientation;Attention;Memory;Following commands;Safety/judgement;Problem solving;Awareness                 Orientation Level: Disoriented to;Time;Place;Situation Current Attention Level: Sustained Memory: Decreased short-term memory Following Commands: Follows one step commands inconsistently Safety/Judgement: Decreased awareness of safety;Decreased awareness of deficits Awareness: Intellectual Problem Solving: Difficulty sequencing;Requires verbal cues;Requires tactile cues;Slow processing General Comments: Pt A&Ox1. Reports he does not know where he is or where he came from (attempted to reorient appropriately). Pt perseverating on "I do not know; i am blind" when answering questions with interpreter. Inconsistent following of commands. incontinent of bowels on entry, attempting to lay back throughout session      General Comments General comments (skin integrity, edema, etc.): Pt's nurse in room to assist as well with hygeine as pt had been incontininet of stool    Exercises     Assessment/Plan    PT Assessment All further PT needs can be met in the next venue of care  PT Problem List Decreased strength;Decreased activity tolerance;Decreased balance;Decreased  mobility;Decreased cognition;Decreased knowledge of use of DME;Decreased safety awareness       PT Treatment Interventions      PT Goals (Current goals can be found in the Care Plan section)  Acute Rehab PT Goals Patient Stated Goal: none stated PT Goal Formulation: All assessment and education complete, DC therapy    Frequency     Barriers to discharge        Co-evaluation PT/OT/SLP Co-Evaluation/Treatment: Yes Reason for Co-Treatment: Necessary to address cognition/behavior during functional activity;For patient/therapist safety;To address functional/ADL transfers PT goals addressed during session: Mobility/safety with mobility OT goals addressed during session: ADL's and self-care       AM-PAC PT "6 Clicks" Mobility  Outcome Measure Help needed turning from your back to your side while in a flat bed without using bedrails?: A Little Help needed moving from lying on your back to sitting on the side of a flat bed without using bedrails?: A Lot Help needed moving to and from a bed to a chair (including a wheelchair)?: Total Help needed standing up from a chair using your arms (e.g., wheelchair or bedside chair)?: Total Help needed to walk in hospital room?: Total Help needed climbing 3-5 steps with a railing? : Total 6 Click Score: 9    End of Session Equipment Utilized During Treatment: Gait belt Activity Tolerance: Other (comment) (Decr participation and ability to follow commands) Patient left: in chair;with call bell/phone within reach;with chair alarm set  Nurse Communication: Mobility status PT Visit Diagnosis: Muscle weakness (generalized) (M62.81);Other abnormalities of gait and mobility (R26.89);Repeated falls (R29.6)    Time: 2761-4709 PT Time Calculation (min) (ACUTE ONLY): 29 min   Charges:   PT Evaluation $PT Eval Moderate Complexity: 1 Mod          [name], [place]  Acute Rehabilitation Services Pager [phone] Office [phone]   Colletta [place] 06/20/2020, 4:10 PM

## 2020-06-20 NOTE — Plan of Care (Signed)

## 2020-06-20 NOTE — Progress Notes (Signed)
PROGRESS NOTE  Timothy [name] PC:[id number] DOB: 1941/03/15 DOA: 06/16/2020 PCP: [name], PA-C   LOS: 3 days   Brief Narrative / Interim history: 80 year old male with history of dementia, chronic kidney disease stage III, alcohol use, anemia of chronic disease, TBI, prior CVA, diabetes, hyperlipidemia, hypertension, legal blindness comes into the hospital with increased somnolence at his nursing home.  Apparently he does not have any family in the area and there is no healthcare power of attorney.  Subjective / 24h Interval events: No complaints, confused  Assessment & Plan: Principal Problem History of CVA, prior TBI, acute metabolic encephalopathy-presented with increased somnolence compared to his baseline per nursing home, possibly in the setting of hyponatremia, and acute kidney injury.  Continue fluids  Active Problems Acute kidney injury on chronic kidney disease stage IV, hypernatremia-baseline creatinine about 2.3, on presentation 7.8.  Renal ultrasound showed medical renal disease, continue fluids, sodium is improving.  Creatinine improving  Anemia of chronic disease -Continue iron, folate  Hyperlipidemia, history of CVA -Continue aspirin, statin  DM2-continue sliding scale  Hypertension-continue home medications  Elevated troponin -Flat, not in a pattern consistent with ACS, likely demand ischemia. Due to increase in ST changes due to LVH with repolarization abnormality, EDP spoke with on-call STEMI provider who agreed that the changes continue to be due to LVH.  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 amLODipine  10 mg Oral Daily  . aspirin  81 mg Oral Daily  . carvedilol  6.25 mg Oral BID WC  . dicyclomine  20 mg Oral Daily  . feeding supplement   Oral TID  . ferrous sulfate  325 mg Oral Daily  . folic acid  1 mg Oral Daily  . heparin  5,000 Units Subcutaneous Q8H  . hydroxypropyl methylcellulose / hypromellose  2 drop Both Eyes TID  . insulin  aspart  0-6 Units Subcutaneous TID WC  . isosorbide mononitrate  30 mg Oral QHS  . rosuvastatin  5 mg Oral QPM  . sodium chloride flush  3 mL Intravenous Q12H  . terazosin  5 mg Oral QHS   Continuous Infusions: . dextrose 100 mL/hr at 06/20/20 0739   PRN Meds:.acetaminophen **OR** acetaminophen, hydrALAZINE, loperamide, polyethylene glycol  Diet Orders (From admission, onward)    Start     Ordered   06/20/20 0954  DIET DYS 2 Room service appropriate? No; Fluid consistency: Thin  Diet effective now       Question Answer Comment  Room service appropriate? No   Fluid consistency: Thin      06/20/20 0953          DVT prophylaxis: heparin injection 5,000 Units Start: 06/16/20 2200     Code Status: Full Code  Family Communication: no family at bedside   Status is: Inpatient  Remains inpatient appropriate because:Inpatient level of care appropriate due to severity of illness  Dispo: The patient is from: SNF              Anticipated d/c is to: SNF              Anticipated d/c date is: 2 days              Patient currently is not medically stable to d/c.   Difficult to place patient No  Level of care: Telemetry Medical  Consultants:  none  Procedures:  None   Microbiology  None   Antimicrobials: None     Objective: Vitals:   06/19/20 2032  06/20/20 0500 06/20/20 0507 06/20/20 0739  BP: (!) 105/26  (!) 113/29 (!) 127/52  Pulse: 69  70 67  Resp: 20  18   Temp: 98.1 F (36.7 C)  97.9 F (36.6 C) 98 F (36.7 C)  TempSrc: Axillary  Axillary Oral  SpO2: 100%  99% 98%  Weight:  38.7 kg    Height:        Intake/Output Summary (Last 24 hours) at 06/20/2020 1226 Last data filed at 06/20/2020 0341 Gross per 24 hour  Intake --  Output 600 ml  Net -600 ml   Filed Weights   06/18/20 0500 06/19/20 0500 06/20/20 0500  Weight: 36.9 kg 37.7 kg 38.7 kg    Examination:  Constitutional: NAD Eyes: no scleral icterus ENMT: Mucous membranes are moist.  Neck: normal,  supple Respiratory: clear to auscultation bilaterally, no wheezing, no crackles. Normal respiratory effort. No accessory muscle use.  Cardiovascular: Regular rate and rhythm, no murmurs / rubs / gallops. No LE edema. Good peripheral pulses Abdomen: non distended, no tenderness. Bowel sounds positive.  Musculoskeletal: no clubbing / cyanosis.  Skin: no rashes Neurologic: non focal   Data Reviewed: I have independently reviewed following labs and imaging studies   CBC: Recent Labs  Lab 06/16/20 1344 06/16/20 1450 06/17/20 0603  WBC 12.8*  --  11.2*  HGB 8.4* 8.5* 8.8*  HCT 27.8* 25.0* 30.8*  MCV 83.7  --  86.5  PLT 244  --  [id number]   Basic Metabolic Panel: Recent Labs  Lab 06/16/20 1652 06/17/20 0030 06/17/20 0603 06/17/20 1158 06/18/20 0107 06/18/20 0441 06/19/20 0041 06/19/20 0327 06/19/20 1733 06/19/20 2022 06/20/20 0102 06/20/20 0413 06/20/20 0812  NA  --  159* 163*   < > 155*   < > 149*   < > 145 146* 141 141 140  K  --  4.6 4.7  --  4.2  --  4.2  --   --   --   --  4.3  --   CL  --  123* 125*  --  121*  --  116*  --   --   --   --  110  --   CO2  --  22 24  --  23  --  22  --   --   --   --  22  --   GLUCOSE  --  156* 161*  --  229*  --  277*  --   --   --   --  245*  --   BUN  --  121* 121*  --  102*  --  88*  --   --   --   --  75*  --   CREATININE  --  7.00* 6.88*  --  6.22*  --  5.60*  --   --   --   --  4.75*  --   CALCIUM  --  7.9* 8.3*  --  7.8*  --  7.7*  --   --   --   --  7.4*  --   MG 2.7*  --   --   --   --   --   --   --   --   --   --   --   --   PHOS  --  6.6* 6.8*  --  5.6*  --  4.8*  --   --   --   --  3.9  --    < > =  values in this interval not displayed.   Liver Function Tests: Recent Labs  Lab 06/16/20 1344 06/17/20 0603 06/18/20 0107 06/19/20 0041 06/20/20 0413  AST 12*  --   --   --   --   ALT 7  --   --   --   --   ALKPHOS 81  --   --   --   --   BILITOT 0.4  --   --   --   --   PROT 6.5  --   --   --   --   ALBUMIN 2.8* 2.7* 2.2*  2.2* 2.1*   Coagulation Profile: No results for input(s): INR, PROTIME in the last 168 hours. HbA1C: No results for input(s): HGBA1C in the last 72 hours. CBG: Recent Labs  Lab 06/19/20 1216 06/19/20 1607 06/19/20 2036 06/20/20 0752 06/20/20 1112  GLUCAP 248* 118* 143* 226* 237*    Recent Results (from the past 240 hour(s))  Culture, blood (routine x 2)     Status: None (Preliminary result)   Collection Time: 06/16/20  2:00 PM   Specimen: BLOOD RIGHT ARM  Result Value Ref Range Status   Specimen Description BLOOD RIGHT ARM  Final   Special Requests   Final    BOTTLES DRAWN AEROBIC AND ANAEROBIC Blood Culture adequate volume   Culture   Final    NO GROWTH 4 DAYS Performed at [facility], [place] [address], [place], [place] [address]    Report Status PENDING  Incomplete  SARS Coronavirus 2 by RT PCR (hospital order, performed in [facility] hospital lab) Nasopharyngeal Nasopharyngeal Swab     Status: None   Collection Time: 06/16/20  2:36 PM   Specimen: Nasopharyngeal Swab  Result Value Ref Range Status   SARS Coronavirus 2 NEGATIVE NEGATIVE Final    Comment: (NOTE) SARS-CoV-2 target nucleic acids are NOT DETECTED.  The SARS-CoV-2 RNA is generally detectable in upper and lower respiratory specimens during the acute phase of infection. The lowest concentration of SARS-CoV-2 viral copies this assay can detect is 250 copies / mL. A negative result does not preclude SARS-CoV-2 infection and should not be used as the sole basis for treatment or other patient management decisions.  A negative result may occur with improper specimen collection / handling, submission of specimen other than nasopharyngeal swab, presence of viral mutation(s) within the areas targeted by this assay, and inadequate number of viral copies (<250 copies / mL). A negative result must be combined with clinical observations, patient history, and epidemiological information.  Fact Sheet for Patients:    StrictlyIdeas.no  Fact Sheet for Healthcare Providers: BankingDealers.co.za  This test is not yet approved or  cleared by the [place] FDA and has been authorized for detection and/or diagnosis of SARS-CoV-2 by FDA under an Emergency Use Authorization (EUA).  This EUA will remain in effect (meaning this test can be used) for the duration of the COVID-19 declaration under Section 564(b)(1) of the Act, 21 U.S.C. section 360bbb-3(b)(1), unless the authorization is terminated or revoked sooner.  Performed at [facility], [place] [address] [place], [place] [address]   Culture, blood (routine x 2)     Status: None (Preliminary result)   Collection Time: 06/16/20  4:10 PM   Specimen: BLOOD  Result Value Ref Range Status   Specimen Description BLOOD SITE NOT SPECIFIED  Final   Special Requests   Final    BOTTLES DRAWN AEROBIC AND ANAEROBIC Blood Culture results  may not be optimal due to an excessive volume of blood received in culture bottles   Culture   Final    NO GROWTH 4 DAYS Performed at [facility], [place] [address], [place], [place] [address]    Report Status PENDING  Incomplete     Radiology Studies: No results found.  Marzetta Board, MD, PhD [facility]  Between 7 am - 7 pm I am available, please contact me via Amion or Securechat  Between 7 pm - 7 am I am not available, please contact night coverage MD/APP via Amion

## 2020-06-21 DIAGNOSIS — G9341 Metabolic encephalopathy: Secondary | ICD-10-CM | POA: Diagnosis not present

## 2020-06-21 LAB — CULTURE, BLOOD (ROUTINE X 2)
Culture: NO GROWTH
Culture: NO GROWTH
Special Requests: ADEQUATE

## 2020-06-21 LAB — RENAL FUNCTION PANEL
Albumin: 2 g/dL — ABNORMAL LOW (ref 3.5–5.0)
Anion gap: 7 (ref 5–15)
BUN: 66 mg/dL — ABNORMAL HIGH (ref 8–23)
CO2: 21 mmol/L — ABNORMAL LOW (ref 22–32)
Calcium: 7.1 mg/dL — ABNORMAL LOW (ref 8.9–10.3)
Chloride: 105 mmol/L (ref 98–111)
Creatinine, Ser: 4.28 mg/dL — ABNORMAL HIGH (ref 0.61–1.24)
GFR, Estimated: 13 mL/min — ABNORMAL LOW (ref >60)
Glucose, Bld: 246 mg/dL — ABNORMAL HIGH (ref 70–99)
Phosphorus: 3.5 mg/dL (ref 2.5–4.6)
Potassium: 4.3 mmol/L (ref 3.5–5.1)
Sodium: 133 mmol/L — ABNORMAL LOW (ref 135–145)

## 2020-06-21 LAB — GLUCOSE, CAPILLARY
Glucose-Capillary: 232 mg/dL — ABNORMAL HIGH (ref 70–99)
Glucose-Capillary: 128 mg/dL — ABNORMAL HIGH (ref 70–99)
Glucose-Capillary: 165 mg/dL — ABNORMAL HIGH (ref 70–99)
Glucose-Capillary: 133 mg/dL — ABNORMAL HIGH (ref 70–99)

## 2020-06-21 LAB — CBC
HCT: 23 % — ABNORMAL LOW (ref 39.0–52.0)
Hemoglobin: 7.4 g/dL — ABNORMAL LOW (ref 13.0–17.0)
MCH: 26.1 pg (ref 26.0–34.0)
MCHC: 32.2 g/dL (ref 30.0–36.0)
MCV: 81 fL (ref 80.0–100.0)
Platelets: 225 10*3/uL (ref 150–400)
RBC: 2.84 MIL/uL — ABNORMAL LOW (ref 4.22–5.81)
RDW: 14.6 % (ref 11.5–15.5)
WBC: 12.1 10*3/uL — ABNORMAL HIGH (ref 4.0–10.5)
nRBC: 0 % (ref 0.0–0.2)

## 2020-06-21 NOTE — Plan of Care (Signed)
  Problem: Clinical Measurements: Goal: Ability to maintain clinical measurements within normal limits will improve Outcome: Progressing Goal: Will remain free from infection Outcome: Progressing Goal: Diagnostic test results will improve Outcome: Progressing Goal: Respiratory complications will improve Outcome: Progressing Goal: Cardiovascular complication will be avoided Outcome: Progressing   Problem: Activity: Goal: Risk for activity intolerance will decrease Outcome: Progressing   Problem: Nutrition: Goal: Adequate nutrition will be maintained Outcome: Progressing   Problem: Coping: Goal: Level of anxiety will decrease Outcome: Progressing   Problem: Elimination: Goal: Will not experience complications related to bowel motility Outcome: Progressing Goal: Will not experience complications related to urinary retention Outcome: Progressing   Problem: Pain Managment: Goal: General experience of comfort will improve Outcome: Progressing   Problem: Safety: Goal: Ability to remain free from injury will improve Outcome: Progressing   Problem: Skin Integrity: Goal: Risk for impaired skin integrity will decrease Outcome: Progressing   Problem: Coping: Goal: Ability to adjust to condition or change in health will improve Outcome: Progressing   Problem: Fluid Volume: Goal: Ability to maintain a balanced intake and output will improve Outcome: Progressing   Problem: Health Behavior/Discharge Planning: Goal: Ability to identify and utilize available resources and services will improve Outcome: Progressing Goal: Ability to manage health-related needs will improve Outcome: Progressing   Problem: Metabolic: Goal: Ability to maintain appropriate glucose levels will improve Outcome: Progressing   Problem: Nutritional: Goal: Maintenance of adequate nutrition will improve Outcome: Progressing Goal: Progress toward achieving an optimal weight will improve Outcome:  Progressing   Problem: Skin Integrity: Goal: Risk for impaired skin integrity will decrease Outcome: Progressing   Problem: Tissue Perfusion: Goal: Adequacy of tissue perfusion will improve Outcome: Progressing

## 2020-06-21 NOTE — Progress Notes (Signed)
PROGRESS NOTE    Timothy [name]  [id number] DOB: [birth year] DOA: 06/16/2020 PCP: [name], PA-C    Brief Narrative:  80 year old gentleman with history of dementia, stage III chronic kidney disease, traumatic brain injury, stroke, diabetes, hypertension hyperlipidemia, legally blind and chronic nursing home resident brought from nursing home with somnolence.  Patient speaks very limited [place].  He does speak [place]. In the emergency room, blood pressure stable.  Sodium 157.  BUN 136 and creatinine 7.8.  Baseline creatinine 2.3.  Patient admitted to the hospital with severe dehydration.   Assessment & Plan:   Principal Problem:   Acute metabolic encephalopathy Active Problems:   Anemia of chronic disease   DM (diabetes mellitus), type 2 with renal complications (HCC)   Acute kidney injury superimposed on chronic kidney disease (HCC)   Legally blind   Hypertension   History of CVA (cerebrovascular accident)   Dementia ([place])   Dyslipidemia   Uremia   Hypernatremia  Severe dehydration/hyper natremia/AKI on CKD stage IV: Significant free water deficit.  Patient probably has no access to free water at nursing home. Sodium 157-159-treated with 5% dextrose and now 133.  Still has no adequate intake. Renal functions gradually improving and this is probably his new baseline. Renal ultrasound with no evidence of obstruction or hydronephrosis. Continue to monitor intake and output. Supervised feeding, supervised water administration. Discontinue dextrose and monitor sodium levels. Severe water deficit and acute renal failure in the context of underlying dementia and unable to keep up with the intake is probably a terminal condition. Consult palliative care, he may be hospice appropriate.  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 No focal deficit. Continue total care.  Long-term  nursing home resident.  Anemia of chronic disease: Stable.  On iron and folic acid.  Type 2 diabetes: On sliding scale insulin.    DVT prophylaxis: heparin injection 5,000 Units Start: 06/16/20 2200   Code Status: Full code Family Communication: None at bedside.  Will consult palliative care team to coordinate, discuss palliation and hospice. Disposition Plan: Status is: Inpatient  Remains inpatient appropriate because:Inpatient level of care appropriate due to severity of illness   Dispo: The patient is from: SNF              Anticipated d/c is to: SNF              Anticipated d/c date is: 2 days              Patient currently is not medically stable to d/c.   Difficult to place patient No         Consultants:   None  Procedures:   None  Antimicrobials:   None   Subjective: Patient seen and examined.  He just ordered some words and were difficult to understand.  Denies any pain or discomfort. Nursing reported minimal eating and needing assist with eating all the time.  Objective: Vitals:   06/20/20 1946 06/21/20 0451 06/21/20 0509 06/21/20 0700  BP: (!) 112/38  (!) 105/35 (!) 138/58  Pulse: 90  79 64  Resp: '20  18 16  '$ Temp: 97.9 F (36.6 C)  97.7 F (36.5 C) 98 F (36.7 C)  TempSrc: Axillary  Axillary Axillary  SpO2: 97%  96% 96%  Weight:  38.4 kg    Height:        Intake/Output Summary (Last 24 hours) at 06/21/2020 1059 Last data filed at  06/21/2020 0336 Gross per 24 hour  Intake -  Output 1150 ml  Net -1150 ml   Filed Weights   06/19/20 0500 06/20/20 0500 06/21/20 0451  Weight: 37.7 kg 38.7 kg 38.4 kg    Examination:  General exam: Chronically sick looking.  Frail and cachectic gentleman lying in bed. Patient is alert but not oriented. Respiratory system: Clear to auscultation. Respiratory effort normal.  No added sounds. Cardiovascular system: S1 & S2 heard, RRR. No JVD, murmurs, rubs, gallops or clicks. No pedal edema. Gastrointestinal  system: Abdomen is nondistended, soft and nontender. No organomegaly or masses felt. Normal bowel sounds heard. Central nervous system: Alert and oriented. No focal neurological deficits. Extremities: Symmetric 5 x 5 power but generalized weakness.     Data Reviewed: I have personally reviewed following labs and imaging studies  CBC: Recent Labs  Lab 06/16/20 1344 06/16/20 1450 06/17/20 0603 06/21/20 0706  WBC 12.8*  --  11.2* 12.1*  HGB 8.4* 8.5* 8.8* 7.4*  HCT 27.8* 25.0* 30.8* 23.0*  MCV 83.7  --  86.5 81.0  PLT 244  --  234 [id number]   Basic Metabolic Panel: Recent Labs  Lab 06/16/20 1652 06/17/20 0030 06/17/20 0603 06/17/20 1158 06/18/20 0107 06/18/20 0441 06/19/20 0041 06/19/20 0327 06/20/20 0102 06/20/20 0413 06/20/20 0812 06/20/20 1202 06/21/20 0706  NA  --    < > 163*   < > 155*   < > 149*   < > 141 141 140 138 133*  K  --    < > 4.7  --  4.2  --  4.2  --   --  4.3  --   --  4.3  CL  --    < > 125*  --  121*  --  116*  --   --  110  --   --  105  CO2  --    < > 24  --  23  --  22  --   --  22  --   --  21*  GLUCOSE  --    < > 161*  --  229*  --  277*  --   --  245*  --   --  246*  BUN  --    < > 121*  --  102*  --  88*  --   --  75*  --   --  66*  CREATININE  --    < > 6.88*  --  6.22*  --  5.60*  --   --  4.75*  --   --  4.28*  CALCIUM  --    < > 8.3*  --  7.8*  --  7.7*  --   --  7.4*  --   --  7.1*  MG 2.7*  --   --   --   --   --   --   --   --   --   --   --   --   PHOS  --    < > 6.8*  --  5.6*  --  4.8*  --   --  3.9  --   --  3.5   < > = values in this interval not displayed.   GFR: Estimated Creatinine Clearance: 7.4 mL/min (A) (by C-G formula based on SCr of 4.28 mg/dL (H)). Liver Function Tests: Recent Labs  Lab 06/16/20 1344 06/17/20 0603 06/18/20 0107 06/19/20 0041 06/20/20 0413 06/21/20 [id number]  AST 12*  --   --   --   --   --   ALT 7  --   --   --   --   --   ALKPHOS 81  --   --   --   --   --   BILITOT 0.4  --   --   --   --   --   PROT  6.5  --   --   --   --   --   ALBUMIN 2.8* 2.7* 2.2* 2.2* 2.1* 2.0*   No results for input(s): LIPASE, AMYLASE in the last 168 hours. No results for input(s): AMMONIA in the last 168 hours. Coagulation Profile: No results for input(s): INR, PROTIME in the last 168 hours. Cardiac Enzymes: No results for input(s): CKTOTAL, CKMB, CKMBINDEX, TROPONINI in the last 168 hours. BNP (last 3 results) No results for input(s): PROBNP in the last 8760 hours. HbA1C: No results for input(s): HGBA1C in the last 72 hours. CBG: Recent Labs  Lab 06/20/20 0752 06/20/20 1112 06/20/20 1643 06/20/20 1950 06/21/20 0735  GLUCAP 226* 237* 190* 153* 232*   Lipid Profile: No results for input(s): CHOL, HDL, LDLCALC, TRIG, CHOLHDL, LDLDIRECT in the last 72 hours. Thyroid Function Tests: No results for input(s): TSH, T4TOTAL, FREET4, T3FREE, THYROIDAB in the last 72 hours. Anemia Panel: No results for input(s): VITAMINB12, FOLATE, FERRITIN, TIBC, IRON, RETICCTPCT in the last 72 hours. Sepsis Labs: Recent Labs  Lab 06/16/20 1400  LATICACIDVEN 1.4    Recent Results (from the past 240 hour(s))  Culture, blood (routine x 2)     Status: None   Collection Time: 06/16/20  2:00 PM   Specimen: BLOOD RIGHT ARM  Result Value Ref Range Status   Specimen Description BLOOD RIGHT ARM  Final   Special Requests   Final    BOTTLES DRAWN AEROBIC AND ANAEROBIC Blood Culture adequate volume   Culture   Final    NO GROWTH 5 DAYS Performed at [facility], [place] [address] [place], [place] [address]    Report Status 06/21/2020 FINAL  Final  SARS Coronavirus 2 by RT PCR (hospital order, performed in [facility] hospital lab) Nasopharyngeal Nasopharyngeal Swab     Status: None   Collection Time: 06/16/20  2:36 PM   Specimen: Nasopharyngeal Swab  Result Value Ref Range Status   SARS Coronavirus 2 NEGATIVE NEGATIVE Final    Comment: (NOTE) SARS-CoV-2 target nucleic acids are NOT DETECTED.  The SARS-CoV-2 RNA is  generally detectable in upper and lower respiratory specimens during the acute phase of infection. The lowest concentration of SARS-CoV-2 viral copies this assay can detect is 250 copies / mL. A negative result does not preclude SARS-CoV-2 infection and should not be used as the sole basis for treatment or other patient management decisions.  A negative result may occur with improper specimen collection / handling, submission of specimen other than nasopharyngeal swab, presence of viral mutation(s) within the areas targeted by this assay, and inadequate number of viral copies (<250 copies / mL). A negative result must be combined with clinical observations, patient history, and epidemiological information.  Fact Sheet for Patients:   StrictlyIdeas.no  Fact Sheet for Healthcare Providers: BankingDealers.co.za  This test is not yet approved or  cleared by the [place] FDA and has been authorized for detection and/or diagnosis of SARS-CoV-2 by FDA under an Emergency Use Authorization (EUA).  This EUA will remain in effect (meaning this test  can be used) for the duration of the COVID-19 declaration under Section 564(b)(1) of the Act, 21 U.S.C. section 360bbb-3(b)(1), unless the authorization is terminated or revoked sooner.  Performed at [facility], [place] [address], [place], [place] [address]   Culture, blood (routine x 2)     Status: None   Collection Time: 06/16/20  4:10 PM   Specimen: BLOOD  Result Value Ref Range Status   Specimen Description BLOOD SITE NOT SPECIFIED  Final   Special Requests   Final    BOTTLES DRAWN AEROBIC AND ANAEROBIC Blood Culture results may not be optimal due to an excessive volume of blood received in culture bottles   Culture   Final    NO GROWTH 5 DAYS Performed at [facility], [place] [address] [place], [place] [address]    Report Status 06/21/2020 FINAL  Final         Radiology  Studies: No results found.      Scheduled Meds: . amLODipine  10 mg Oral Daily  . aspirin  81 mg Oral Daily  . carvedilol  6.25 mg Oral BID WC  . dicyclomine  20 mg Oral Daily  . feeding supplement   Oral TID  . ferrous sulfate  325 mg Oral Daily  . folic acid  1 mg Oral Daily  . heparin  5,000 Units Subcutaneous Q8H  . hydroxypropyl methylcellulose / hypromellose  2 drop Both Eyes TID  . insulin aspart  0-6 Units Subcutaneous TID WC  . isosorbide mononitrate  30 mg Oral QHS  . rosuvastatin  5 mg Oral QPM  . sodium chloride flush  3 mL Intravenous Q12H  . terazosin  5 mg Oral QHS   Continuous Infusions:   LOS: 4 days    Time spent: 30 minutes    Barb Merino, MD [facility] Pager [phone]

## 2020-06-21 NOTE — Progress Notes (Signed)
  Speech Language Pathology Treatment: Dysphagia  Patient Details Name: Timothy [name] MRN: [id number] DOB: 11-15-1940 Today's Date: 06/21/2020 Time: 1499-6924 SLP Time Calculation (min) (ACUTE ONLY): 14 min  Assessment / Plan / Recommendation Clinical Impression  Pt was seen for dysphagia treatment and was cooperative throughout the session. Pt, nursing, and NT reported that the pt has been tolerating the current diet without overt s/sx of aspiration. Pt's NT stated that the pt has not wanted to eat much, but denied any difficulty. Pt tolerated puree solids, dysphagia 2 solids, and thin liquids via straw using consecutive swallows without symptoms of oropharyngeal dysphagia. It is recommended that the current diet be continued. Further skilled SLP services are not clinically indicated at this time.    HPI HPI: Pt is an 80 y.o. male with medical history significant of CKD 3, alcohol use, anemia of chronic disease, TBI, CVA, dementia, diabetes, hyperlipidemia, hypertension, legally blind in both eyes who presented with reports of increased somnolence at his nursing facility. Pt's native tongue is [place] and he speaks some English per reports. CXR 1/29: Chronic but progressed left basilar opacity, corresponding to  bronchiectasis and mucous plugging on prior CT. BSE 02/21/2018 recommended a dyspahgia 2 diet with thin liquids.      SLP Plan  All goals met;Discharge SLP treatment due to (comment)       Recommendations  Diet recommendations: Dysphagia 2 (fine chop);Thin liquid Liquids provided via: Cup;Straw Medication Administration: Crushed with puree Supervision: Patient able to self feed;Staff to assist with self feeding Compensations: Slow rate;Small sips/bites                Oral Care Recommendations: Oral care BID Follow up Recommendations: None SLP Visit Diagnosis: Dysphagia, unspecified (R13.10) Plan: All goals met;Discharge SLP treatment due to (comment)       Zenita Kister I.  Timothy [name], [place], [place] Office number [phone] Pager [place] 06/21/2020, 10:35 AM

## 2020-06-21 NOTE — Care Management Important Message (Signed)
Important Message  Patient Details  Name: Homero Stanbro MRN: [id number] Date of Birth: 1940/09/04   Medicare Important Message Given:  Yes     Nick Armel 06/21/2020, 2:27 PM

## 2020-06-22 DIAGNOSIS — Z66 Do not resuscitate: Secondary | ICD-10-CM | POA: Diagnosis not present

## 2020-06-22 DIAGNOSIS — G9341 Metabolic encephalopathy: Secondary | ICD-10-CM | POA: Diagnosis not present

## 2020-06-22 DIAGNOSIS — Z7189 Other specified counseling: Secondary | ICD-10-CM | POA: Diagnosis not present

## 2020-06-22 DIAGNOSIS — Z515 Encounter for palliative care: Secondary | ICD-10-CM | POA: Diagnosis not present

## 2020-06-22 LAB — BASIC METABOLIC PANEL
Anion gap: 10 (ref 5–15)
BUN: 70 mg/dL — ABNORMAL HIGH (ref 8–23)
CO2: 21 mmol/L — ABNORMAL LOW (ref 22–32)
Calcium: 7.4 mg/dL — ABNORMAL LOW (ref 8.9–10.3)
Chloride: 104 mmol/L (ref 98–111)
Creatinine, Ser: 4.55 mg/dL — ABNORMAL HIGH (ref 0.61–1.24)
GFR, Estimated: 12 mL/min — ABNORMAL LOW (ref >60)
Glucose, Bld: 171 mg/dL — ABNORMAL HIGH (ref 70–99)
Potassium: 4.1 mmol/L (ref 3.5–5.1)
Sodium: 135 mmol/L (ref 135–145)

## 2020-06-22 LAB — TYPE AND SCREEN
ABO/RH(D): B POS
Antibody Screen: NEGATIVE

## 2020-06-22 LAB — CBC WITH DIFFERENTIAL/PLATELET
Abs Immature Granulocytes: 0.16 10*3/uL — ABNORMAL HIGH (ref 0.00–0.07)
Basophils Absolute: 0 10*3/uL (ref 0.0–0.1)
Basophils Relative: 0 %
Eosinophils Absolute: 0.3 10*3/uL (ref 0.0–0.5)
Eosinophils Relative: 2 %
HCT: 19.6 % — ABNORMAL LOW (ref 39.0–52.0)
Hemoglobin: 6.4 g/dL — CL (ref 13.0–17.0)
Immature Granulocytes: 1 %
Lymphocytes Relative: 9 %
Lymphs Abs: 1.7 10*3/uL (ref 0.7–4.0)
MCH: 26.1 pg (ref 26.0–34.0)
MCHC: 32.7 g/dL (ref 30.0–36.0)
MCV: 80 fL (ref 80.0–100.0)
Monocytes Absolute: 1.3 10*3/uL — ABNORMAL HIGH (ref 0.1–1.0)
Monocytes Relative: 7 %
Neutro Abs: 14.8 10*3/uL — ABNORMAL HIGH (ref 1.7–7.7)
Neutrophils Relative %: 81 %
Platelets: 224 10*3/uL (ref 150–400)
RBC: 2.45 MIL/uL — ABNORMAL LOW (ref 4.22–5.81)
RDW: 14.4 % (ref 11.5–15.5)
WBC: 18.1 10*3/uL — ABNORMAL HIGH (ref 4.0–10.5)
nRBC: 0 % (ref 0.0–0.2)

## 2020-06-22 LAB — MAGNESIUM: Magnesium: 2.1 mg/dL (ref 1.7–2.4)

## 2020-06-22 LAB — GLUCOSE, CAPILLARY
Glucose-Capillary: 140 mg/dL — ABNORMAL HIGH (ref 70–99)
Glucose-Capillary: 118 mg/dL — ABNORMAL HIGH (ref 70–99)
Glucose-Capillary: 154 mg/dL — ABNORMAL HIGH (ref 70–99)
Glucose-Capillary: 139 mg/dL — ABNORMAL HIGH (ref 70–99)

## 2020-06-22 LAB — PHOSPHORUS: Phosphorus: 4.1 mg/dL (ref 2.5–4.6)

## 2020-06-22 LAB — ABO/RH: ABO/RH(D): B POS

## 2020-06-22 LAB — OCCULT BLOOD X 1 CARD TO LAB, STOOL: Fecal Occult Bld: POSITIVE — AB

## 2020-06-22 NOTE — Progress Notes (Signed)
PROGRESS NOTE    Timothy [name]  [id number] DOB: [birth year] DOA: 06/16/2020 PCP: [name], PA-C    Brief Narrative:  80 year old gentleman with history of dementia, stage III chronic kidney disease, traumatic brain injury, stroke, diabetes, hypertension hyperlipidemia, legally blind and chronic nursing home resident brought from nursing home with somnolence.  Patient speaks very limited [place].  He does speak [place]. In the emergency room, blood pressure stable.  Sodium 157.  BUN 136 and creatinine 7.8.  Baseline creatinine 2.3.  Patient admitted to the hospital with severe dehydration.   Assessment & Plan:   Principal Problem:   Acute metabolic encephalopathy Active Problems:   Anemia of chronic disease   DM (diabetes mellitus), type 2 with renal complications (HCC)   Acute kidney injury superimposed on chronic kidney disease (HCC)   Legally blind   Hypertension   History of CVA (cerebrovascular accident)   Dementia ([place])   Dyslipidemia   Uremia   Hypernatremia  Severe dehydration/hypernatremia/AKI on CKD stage IV: Significant free water deficit.  Patient probably has no access to free water at nursing home. Sodium 157-159-treated with 5% dextrose and now 133-135.  Still has no adequate oral intake. Renal functions gradually improving and this is probably his new baseline. Renal ultrasound with no evidence of obstruction or hydronephrosis. Continue to monitor intake and output. Supervised feeding, supervised water administration. Discontinuing IV fluid to monitor if he can keep up on oral intake. Severe water deficit and acute renal failure in the context of underlying dementia and unable to keep up with the intake is probably a terminal condition. Consult palliative care, he may be hospice appropriate.  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 No focal  deficit. Continue total care.  Long-term nursing home resident.  Acute on chronic anemia due to CKD: Hemoglobin is 6.4.  Anticipated with chronic anemia.  No active blood loss.  On iron and folic acid. Start palliative discussion.  We will hold off on transfusion today.  He does not have any next of kin or guardian to give consent for blood transfusion.  No emergent transfusion need.  Type 2 diabetes: On sliding scale insulin.    DVT prophylaxis: Place and maintain sequential compression device Start: 06/22/20 0442   Code Status: Full code Family Communication: Called patient's niece on the number provided, unable to talk. Called patient's friend who is a Education officer, museum on the number provided, we discussed about patient's poor outcome and appropriateness for hospice and palliation. She agrees that patient is appropriate for palliation and hospice, once we make decision will need to let his church friends know about it. Disposition Plan: Status is: Inpatient  Remains inpatient appropriate because:Inpatient level of care appropriate due to severity of illness   Dispo: The patient is from: SNF              Anticipated d/c is to: SNF vs hospice home.              Anticipated d/c date is: 2 days              Patient currently is not medically stable to d/c.   Difficult to place patient No   Consultants:   Palliative care team.  Procedures:   None  Antimicrobials:   None   Subjective: Patient seen and examined.  He was curled up in the bed, refused to talk.  On further questioning he just tells me  he is blind.  He is not able to express any other concerns.  Remains afebrile.  Looks comfortable.  Objective: Vitals:   06/21/20 2145 06/21/20 2254 06/22/20 0500 06/22/20 0513  BP: (!) 128/58 126/62  96/85  Pulse: 80   81  Resp: 18   18  Temp: 97.9 F (36.6 C)   98.2 F (36.8 C)  TempSrc:    Oral  SpO2: 100%   100%  Weight:   38 kg   Height:        Intake/Output Summary  (Last 24 hours) at 06/22/2020 1129 Last data filed at 06/22/2020 0503 Gross per 24 hour  Intake 240 ml  Output 1300 ml  Net -1060 ml   Filed Weights   06/20/20 0500 06/21/20 0451 06/22/20 0500  Weight: 38.7 kg 38.4 kg 38 kg    Examination:  General exam: Chronically sick looking.  Frail and cachectic gentleman lying in bed. Patient is alert on stimulation but not oriented. Respiratory system: Clear to auscultation. Respiratory effort normal.  No added sounds. Cardiovascular system: S1 & S2 heard, RRR. No JVD, murmurs, rubs, gallops or clicks. No pedal edema. Gastrointestinal system: Abdomen is nondistended, soft and nontender. No organomegaly or masses felt. Normal bowel sounds heard. Central nervous system: Moves all extremities. Extremities: Symmetric 5 x 5 power but generalized weakness.     Data Reviewed: I have personally reviewed following labs and imaging studies  CBC: Recent Labs  Lab 06/16/20 1344 06/16/20 1450 06/17/20 0603 06/21/20 0706 06/22/20 0334  WBC 12.8*  --  11.2* 12.1* 18.1*  NEUTROABS  --   --   --   --  14.8*  HGB 8.4* 8.5* 8.8* 7.4* 6.4*  HCT 27.8* 25.0* 30.8* 23.0* 19.6*  MCV 83.7  --  86.5 81.0 80.0  PLT 244  --  234 225 [id number]   Basic Metabolic Panel: Recent Labs  Lab 06/16/20 1652 06/17/20 0030 06/18/20 0107 06/18/20 0441 06/19/20 0041 06/19/20 0327 06/20/20 0413 06/20/20 0812 06/20/20 1202 06/21/20 0706 06/22/20 0334  NA  --    < > 155*   < > 149*   < > 141 140 138 133* 135  K  --    < > 4.2  --  4.2  --  4.3  --   --  4.3 4.1  CL  --    < > 121*  --  116*  --  110  --   --  105 104  CO2  --    < > 23  --  22  --  22  --   --  21* 21*  GLUCOSE  --    < > 229*  --  277*  --  245*  --   --  246* 171*  BUN  --    < > 102*  --  88*  --  75*  --   --  66* 70*  CREATININE  --    < > 6.22*  --  5.60*  --  4.75*  --   --  4.28* 4.55*  CALCIUM  --    < > 7.8*  --  7.7*  --  7.4*  --   --  7.1* 7.4*  MG 2.7*  --   --   --   --   --   --   --    --   --  2.1  PHOS  --    < > 5.6*  --  4.8*  --  3.9  --   --  3.5 4.1   < > = values in this interval not displayed.   GFR: Estimated Creatinine Clearance: 6.8 mL/min (A) (by C-G formula based on SCr of 4.55 mg/dL (H)). Liver Function Tests: Recent Labs  Lab 06/16/20 1344 06/17/20 0603 06/18/20 0107 06/19/20 0041 06/20/20 0413 06/21/20 0706  AST 12*  --   --   --   --   --   ALT 7  --   --   --   --   --   ALKPHOS 81  --   --   --   --   --   BILITOT 0.4  --   --   --   --   --   PROT 6.5  --   --   --   --   --   ALBUMIN 2.8* 2.7* 2.2* 2.2* 2.1* 2.0*   No results for input(s): LIPASE, AMYLASE in the last 168 hours. No results for input(s): AMMONIA in the last 168 hours. Coagulation Profile: No results for input(s): INR, PROTIME in the last 168 hours. Cardiac Enzymes: No results for input(s): CKTOTAL, CKMB, CKMBINDEX, TROPONINI in the last 168 hours. BNP (last 3 results) No results for input(s): PROBNP in the last 8760 hours. HbA1C: No results for input(s): HGBA1C in the last 72 hours. CBG: Recent Labs  Lab 06/21/20 0735 06/21/20 1132 06/21/20 1627 06/21/20 2147 06/22/20 0753  GLUCAP 232* 128* 165* 133* 140*   Lipid Profile: No results for input(s): CHOL, HDL, LDLCALC, TRIG, CHOLHDL, LDLDIRECT in the last 72 hours. Thyroid Function Tests: No results for input(s): TSH, T4TOTAL, FREET4, T3FREE, THYROIDAB in the last 72 hours. Anemia Panel: No results for input(s): VITAMINB12, FOLATE, FERRITIN, TIBC, IRON, RETICCTPCT in the last 72 hours. Sepsis Labs: Recent Labs  Lab 06/16/20 1400  LATICACIDVEN 1.4    Recent Results (from the past 240 hour(s))  Culture, blood (routine x 2)     Status: None   Collection Time: 06/16/20  2:00 PM   Specimen: BLOOD RIGHT ARM  Result Value Ref Range Status   Specimen Description BLOOD RIGHT ARM  Final   Special Requests   Final    BOTTLES DRAWN AEROBIC AND ANAEROBIC Blood Culture adequate volume   Culture   Final    NO  GROWTH 5 DAYS Performed at [facility], [place] [address], [place], [place] [address]    Report Status 06/21/2020 FINAL  Final  SARS Coronavirus 2 by RT PCR (hospital order, performed in [facility] hospital lab) Nasopharyngeal Nasopharyngeal Swab     Status: None   Collection Time: 06/16/20  2:36 PM   Specimen: Nasopharyngeal Swab  Result Value Ref Range Status   SARS Coronavirus 2 NEGATIVE NEGATIVE Final    Comment: (NOTE) SARS-CoV-2 target nucleic acids are NOT DETECTED.  The SARS-CoV-2 RNA is generally detectable in upper and lower respiratory specimens during the acute phase of infection. The lowest concentration of SARS-CoV-2 viral copies this assay can detect is 250 copies / mL. A negative result does not preclude SARS-CoV-2 infection and should not be used as the sole basis for treatment or other patient management decisions.  A negative result may occur with improper specimen collection / handling, submission of specimen other than nasopharyngeal swab, presence of viral mutation(s) within the areas targeted by this assay, and inadequate number of viral copies (<250 copies / mL). A negative result must be combined with clinical observations, patient history, and epidemiological information.  Fact Sheet for Patients:   StrictlyIdeas.no  Fact Sheet for Healthcare Providers: BankingDealers.co.za  This test is not yet approved or  cleared by the [place] FDA and has been authorized for detection and/or diagnosis of SARS-CoV-2 by FDA under an Emergency Use Authorization (EUA).  This EUA will remain in effect (meaning this test can be used) for the duration of the COVID-19 declaration under Section 564(b)(1) of the Act, 21 U.S.C. section 360bbb-3(b)(1), unless the authorization is terminated or revoked sooner.  Performed at [facility], [place] [address] [place], [place] [address]   Culture, blood (routine x 2)      Status: None   Collection Time: 06/16/20  4:10 PM   Specimen: BLOOD  Result Value Ref Range Status   Specimen Description BLOOD SITE NOT SPECIFIED  Final   Special Requests   Final    BOTTLES DRAWN AEROBIC AND ANAEROBIC Blood Culture results may not be optimal due to an excessive volume of blood received in culture bottles   Culture   Final    NO GROWTH 5 DAYS Performed at [facility], [place] [address], [place], [place] [address]    Report Status 06/21/2020 FINAL  Final         Radiology Studies: No results found.      Scheduled Meds: . amLODipine  10 mg Oral Daily  . aspirin  81 mg Oral Daily  . carvedilol  6.25 mg Oral BID WC  . dicyclomine  20 mg Oral Daily  . feeding supplement   Oral TID  . ferrous sulfate  325 mg Oral Daily  . folic acid  1 mg Oral Daily  . hydroxypropyl methylcellulose / hypromellose  2 drop Both Eyes TID  . insulin aspart  0-6 Units Subcutaneous TID WC  . isosorbide mononitrate  30 mg Oral QHS  . rosuvastatin  5 mg Oral QPM  . sodium chloride flush  3 mL Intravenous Q12H  . terazosin  5 mg Oral QHS   Continuous Infusions:   LOS: 5 days    Time spent: 30 minutes    Barb Merino, MD [facility] Pager [phone]

## 2020-06-22 NOTE — Consult Note (Signed)
Palliative Medicine Inpatient Consult Note  Reason for consult: Goals of Care  "goal of care , hospice appropriate"  HPI:  Per intake H&P --> 80 year old gentleman with history of dementia, stage III chronic kidney disease, traumatic brain injury, stroke, diabetes, hypertension hyperlipidemia, legally blind and chronic nursing home resident brought from nursing home with somnolence.  Patient speaks very limited [place].  He does speak [place]. In the emergency room, blood pressure stable.  Sodium 157.  BUN 136 and creatinine 7.8.  Baseline creatinine 2.3.  Patient admitted to the hospital with severe dehydration.  Palliative care has been consulted to discuss goals of care.  Clinical Assessment/Goals of Care:  *Please note that this is a verbal dictation therefore any spelling or grammatical errors are due to the "[place] [facility]" system interpretation.  I have reviewed medical records including EPIC notes, labs and imaging, received report from bedside RN, assessed the patient who is very frail and moving around in the bed uncomfortably.    I met with patients Timothy, Shella [name] to further discuss diagnosis prognosis, GOC, EOL wishes, disposition and options.   I introduced Palliative Medicine as specialized medical care for people living with serious illness. It focuses on providing relief from the symptoms and stress of a serious illness. The goal is to improve quality of life for both the patient and the family.  Timothy [name] shares that [place] is from [place], [place]. This patient has [place] 1992. He use to work in [employer]. [name] is married and has four children though they are all in [place] and they have not seen one another in many years. He is a man of faith and practices within Christianity.  Prior to hospitalization patient had been living at [facility] facility. He has required help with bADLs.   Catcher's Timothy shares that he looks very poorly. She  expresses that she has not seen him in almost three years. Prior to the pandemic she would often pick him up and take him out. He was quite mobile and far more nourished then per [place]. We discussed his poor nutritional state. We reviewed that his body is failing as indicated by his Cr, albumin, and Hgb levels. She states understanding just through seeing him.   A detailed discussion was had today regarding advanced directives - patient has none on file. While in house his Timothy, Timothy [name] has been helping with decision making. Timothy [name] has three daughters, one son, and a wife in [place]. He also has one brother and two sisters in addition to multiple nephews.    Concepts specific to code status, artifical feeding and hydration, continued IV antibiotics and rehospitalization were had with Dr. Sloan Leiter earlier in the day when DNAR/DNI were elected to be pursued given the little to no benefit these interventions would have.    The difference between a aggressive medical intervention path  and a palliative comfort care path for this patient at this time was had. We discussed that [place] as he is presently would be appropriate for hospice care.  I described hospice as a service for patients for have a life expectancy of < 6 months. It preserves dignity and quality at the end phases of life. The focus changes from curative to symptom relief.   We talked about having a family meeting tomorrow for further discussion of Nengs poor health state.   Discussed the importance of continued conversation with family and their  medical providers regarding overall plan of care and treatment  options, ensuring decisions are within the context of the patients values and GOCs.  Decision Maker: Shella [name] (Timothy) [phone]  SUMMARY OF RECOMMENDATIONS   DNAR/DNI  Plan for family meeting tomorrow at 10:30AM  Ongoing conversations regarding consideration of hospice  Code Status/Advance Care Planning: DNAR/DNI   Palliative  Prophylaxis:   Oral care, Turn Q2H, Delirium Precuations  Additional Recommendations (Limitations, Scope, Preferences):  Continue current scope of care to treat the treatable   Psycho-social/Spiritual:   Desire for further Chaplaincy support: No - patient is christian  Additional Recommendations: Education on end of life care and hospice   Prognosis: Poor in the setting of multi-system failure  Discharge Planning: Discharge plan is uncertain at this time  Vitals:   06/22/20 0513 06/22/20 1230  BP: 96/85 (!) 118/52  Pulse: 81 79  Resp: 18 20  Temp: 98.2 F (36.8 C) 98.9 F (37.2 C)  SpO2: 100% 100%    Intake/Output Summary (Last 24 hours) at 06/22/2020 1536 Last data filed at 06/22/2020 0503 Gross per 24 hour  Intake -  Output 1300 ml  Net -1300 ml   Last Weight  Most recent update: 06/22/2020  5:04 AM   Weight  38 kg (83 lb 12.4 oz)           Gen:  Frail, cachectic vietnamese M in NAD HEENT: Poor dentition, dry mucous membranes CV: Regular rate and rhythm  PULM: clear to auscultation bilaterally  ABD: soft/nontender  EXT: No edema  Neuro: Somnolent  PPS: 20%   This conversation/these recommendations were discussed with patient primary care team, Dr. Sloan Leiter  Time In: 1500 Time Out: 1610 Total Time: 70 Greater than 50%  of this time was spent counseling and coordinating care related to the above assessment and plan.  [place] Team Team Cell Phone: [phone] Please utilize secure chat with additional questions, if there is no response within 30 minutes please call the above phone number  Palliative Medicine Team providers are available by phone from 7am to 7pm daily and can be reached through the team cell phone.  Should this patient require assistance outside of these hours, please call the patient's attending physician.

## 2020-06-22 NOTE — Progress Notes (Signed)
Critical Result By Messan Houegnifio  Hgb 6.4  @ 0334 on 06-22-2020  Handoff to [place], [place]

## 2020-06-22 NOTE — Progress Notes (Signed)
Patient's niece called back.  Apparently she works in the hospital and comes to visit him when she is here for second shift.  She has been seeing him every day and he has been apathetic, not wanting to do anything as well not wanting to eat or drink. We discussed that patient is probably terminal and may best served with hospice and palliation. Patient social worker has agreed for such plan. Ms Ned Clines will talk to her brother discussed with him. We will continue to communicate with family. We will change patient to DNR, CPR and intubation will be futile to this patient.

## 2020-06-22 NOTE — Progress Notes (Signed)
CRITICAL VALUE ALERT  Critical Value: Hemoglobin 6.4  Date & Time Notied:  06/22/20 at [place]  Provider Notified: Marlowe Sax, MD at [phone]  Orders Received/Actions taken: Type and screen ordered. Per MD instruction, will wait for day shift team to obtain consent and make decision about blood transfusion.

## 2020-06-23 DIAGNOSIS — Z66 Do not resuscitate: Secondary | ICD-10-CM | POA: Diagnosis not present

## 2020-06-23 DIAGNOSIS — Z7189 Other specified counseling: Secondary | ICD-10-CM | POA: Diagnosis not present

## 2020-06-23 DIAGNOSIS — G9341 Metabolic encephalopathy: Secondary | ICD-10-CM | POA: Diagnosis not present

## 2020-06-23 DIAGNOSIS — Z515 Encounter for palliative care: Secondary | ICD-10-CM | POA: Diagnosis not present

## 2020-06-23 LAB — BASIC METABOLIC PANEL
Anion gap: 11 (ref 5–15)
BUN: 71 mg/dL — ABNORMAL HIGH (ref 8–23)
CO2: 19 mmol/L — ABNORMAL LOW (ref 22–32)
Calcium: 7.6 mg/dL — ABNORMAL LOW (ref 8.9–10.3)
Chloride: 107 mmol/L (ref 98–111)
Creatinine, Ser: 5.07 mg/dL — ABNORMAL HIGH (ref 0.61–1.24)
GFR, Estimated: 11 mL/min — ABNORMAL LOW (ref >60)
Glucose, Bld: 144 mg/dL — ABNORMAL HIGH (ref 70–99)
Potassium: 4.2 mmol/L (ref 3.5–5.1)
Sodium: 137 mmol/L (ref 135–145)

## 2020-06-23 LAB — GLUCOSE, CAPILLARY: Glucose-Capillary: 122 mg/dL — ABNORMAL HIGH (ref 70–99)

## 2020-06-23 MED ORDER — BISACODYL 10 MG RE SUPP: 10.0000 mg | Freq: Every day | RECTAL | Status: DC | PRN

## 2020-06-23 MED ORDER — ONDANSETRON HCL 4 MG/2ML IJ SOLN: 4.0000 mg | Freq: Four times a day (QID) | INTRAMUSCULAR | Status: DC | PRN

## 2020-06-23 MED ORDER — LORAZEPAM 2 MG/ML IJ SOLN: 0.5000 mg | INTRAMUSCULAR | Status: DC | PRN

## 2020-06-23 MED ORDER — ATROPINE SULFATE 1 % OP SOLN
2.0000 [drp] | Freq: Four times a day (QID) | OPHTHALMIC | Status: DC | PRN
  Filled 2020-06-23: qty 2

## 2020-06-23 MED ORDER — FENTANYL CITRATE (PF) 100 MCG/2ML IJ SOLN: 25.0000 ug | INTRAMUSCULAR | Status: DC | PRN

## 2020-06-23 NOTE — Progress Notes (Signed)
Palliative Medicine Inpatient Follow Up Note  Reason for consult: Goals of Care  "goal of care , hospice appropriate"  HPI:  Per intake H&P --> 80 year old gentleman with history of dementia, stage III chronic kidney disease, traumatic brain injury, stroke, diabetes, hypertension hyperlipidemia, legally blind and chronic nursing home resident brought from nursing home with somnolence. Patient speaks very limited [place]. He does speak [place]. In the emergency room, blood pressure stable. Sodium 157. BUN 136 and creatinine 7.8. Baseline creatinine 2.3. Patient admitted to the hospital with severe dehydration.  Palliative care has been consulted to discuss goals of care.  Today's Discussion (06/23/2020):  *Please note that this is a verbal dictation therefore any spelling or grammatical errors are due to the "[place] One" system interpretation.  Family meeting held with patients niece and nephew at bedside. Interpretive services's and patients MSW were present to aid in [place].    We reviewed patient's past medical history inclusive of his dementia, strokes, blindness, and stage III kidney disease.    We reviewed again that he has declined in the past 2 to 3 years since being in a custodial setting.   We discussed that upon admission he had exceptionally abnormal laboratory results in the setting of severe dehydration.  These were all likely in the setting of failure to thrive from his dementia.  Roch's family expressed that they had not seen him in many years and felt that he looks very different.  We reviewed how he is very thin and frail in the setting of his advancing dementia.  We talked about his poor functional state and how that may look in the long run. I shared that at this point in time if would be worthwhile to consider comfort measures and transition to inpatient hospice.  We talked about transition to comfort measures in house and what that  would entail inclusive of medications to control pain, dyspnea, agitation, nausea, itching, and hiccups.   We discussed stopping all uneccessary measures such as blood draws, needle sticks, and frequent vital signs.   Patients niece and nephew were in agreement with this plan. They requested [facility] for the hospice home of choice.  Questions and concerns addressed   Patients MD, RN, MSW were updated on the plan for comfort transition.  Objective Assessment: Vital Signs Vitals:   06/23/20 0927 06/23/20 0927  BP: (!) 129/51 (!) 129/51  Pulse:  69  Resp:  16  Temp:  97.8 F (36.6 C)  SpO2:  100%    Intake/Output Summary (Last 24 hours) at 06/23/2020 1134 Last data filed at 06/22/2020 2000 Gross per 24 hour  Intake --  Output 300 ml  Net -300 ml   Last Weight  Most recent update: 06/22/2020  5:04 AM   Weight  38 kg (83 lb 12.4 oz)           Gen:  Frail, cachectic vietnamese M in NAD HEENT: Poor dentition, dry mucous membranes CV: Regular rate and rhythm  PULM: clear to auscultation bilaterally  ABD: soft/nontender  EXT: No edema  Neuro: Somnolent  SUMMARY OF RECOMMENDATIONS   DNAR/DNI  Comfort focused care - Medications per [facility]  Liberalize visitation policy  TOC -->[facility]  Time Spent: 45 Greater than 50% of the time was spent in counseling and coordination of care ______________________________________________________________________________________ [place] Team Team Cell Phone: [phone] Please utilize secure chat with additional questions, if there is no response within 30  minutes please call the above phone number  Palliative Medicine Team providers are available by phone from 7am to 7pm daily and can be reached through the team cell phone.  Should this patient require assistance outside of these hours, please call the patient's attending physician.

## 2020-06-23 NOTE — Progress Notes (Signed)
Received request from [place] for family interest in [facility]. Chart and pt information under review by [facility] physician.  Hospice eligibility pending at this time.  [facility] is unable to offer a room today. Hospital Liaison will follow up tomorrow or sooner if a room becomes available. Please do not hesitate to call with questions.    Thank you for the opportunity to participate in this patient's care.  Domenic Moras, BSN, RN [facility] Liaison (listed on [place] under Hospice[facility])    [phone]  (24h on call)

## 2020-06-23 NOTE — Progress Notes (Signed)
Pt ate 75 % of breakfast with this nurse feeding patient and 60% of lunch in addition to a magic cup.

## 2020-06-23 NOTE — Progress Notes (Signed)
PROGRESS NOTE    Timothy [name]  [id number] DOB: [birth year] DOA: 06/16/2020 PCP: [name], PA-C    Brief Narrative:  80 year old gentleman with history of dementia, stage III chronic kidney disease, traumatic brain injury, stroke, diabetes, hypertension hyperlipidemia, legally blind and chronic nursing home resident brought from nursing home with somnolence.  Patient speaks very limited [place].  He does speak [place]. In the emergency room, blood pressure stable.  Sodium 157.  BUN 136 and creatinine 7.8.  Baseline creatinine 2.3.  Patient admitted to the hospital with severe dehydration. Patient's recovery remain poor.  He is not eating and drinking.  Palliative and hospice discussion initiated and family and friends agreed for comfort care and hospice on 2/5. Refer to inpatient hospice admission.   Assessment & Plan:   Principal Problem:   Acute metabolic encephalopathy Active Problems:   Anemia of chronic disease   DM (diabetes mellitus), type 2 with renal complications (HCC)   Acute kidney injury superimposed on chronic kidney disease (HCC)   Legally blind   Hypertension   History of CVA (cerebrovascular accident)   Dementia ([place])   Dyslipidemia   Uremia   Hypernatremia  Severe dehydration/hypernatremia/AKI on CKD stage IV: Significant free water deficit.   Acute metabolic encephalopathy with history of dementia and traumatic brain injury, extreme debilitated. Severe protein calorie malnutrition Acute on chronic anemia of chronic kidney disease Type 2 diabetes End-of-life care  Plan: Patient with extreme debility, hypernatremia, progressive renal failure nearing end-of-life. Palliative care meeting with family's, friends from church and available community on 2/5. Comfort care and hospice. Refer to inpatient hospice. All symptom control medications available.  Do not escalate care.  No lab draws. RN to pronounce death if happens. Stable to transfer to  inpatient hospice if bed available.   DVT prophylaxis: Comfort care   Code Status: DNR/DNI Family Communication: Multiple with palliative disposition Plan: Status is: Inpatient  Remains inpatient appropriate because:Inpatient level of care appropriate due to severity of illness   Dispo: The patient is from: SNF              Anticipated d/c is to: Inpatient hospice              Anticipated d/c date is: When bed available              Patient currently is not medically stable to d/c.  He is stable to transfer to hospice level of care.   Difficult to place patient No   Consultants:   Palliative care   Procedures:   None  Antimicrobials:   None   Subjective: Patient seen in early morning rounding.  He was just curled up and sleepy.  Did not respond.  He did utter some words but unable to understand. Palliative meeting concluded later today and started on comfort care.  Objective: Vitals:   06/22/20 2202 06/23/20 0527 06/23/20 0927 06/23/20 0927  BP: (!) 114/54 (!) 119/53 (!) 129/51 (!) 129/51  Pulse: 73 76  69  Resp: '18 17  16  '$ Temp: 99.2 F (37.3 C) 98.5 F (36.9 C)  97.8 F (36.6 C)  TempSrc: Oral Oral    SpO2: 100% 100%  100%  Weight:      Height:        Intake/Output Summary (Last 24 hours) at 06/23/2020 1144 Last data filed at 06/22/2020 2000 Gross per 24 hour  Intake -  Output 300 ml  Net -300 ml   Filed Weights   06/20/20 0500  06/21/20 0451 06/22/20 0500  Weight: 38.7 kg 38.4 kg 38 kg    Examination:  General exam: Chronically sick looking.  Frail and cachectic gentleman lying in bed. Patient is alert on stimulation but not oriented. Respiratory system: Clear to auscultation. Respiratory effort normal.  No added sounds. Cardiovascular system: S1 & S2 heard, RRR. No JVD, murmurs, rubs, gallops or clicks. No pedal edema. Gastrointestinal system: Abdomen is nondistended, soft and nontender. No organomegaly or masses felt. Normal bowel sounds  heard. Central nervous system: Moves all extremities. Extremities: Symmetric 5 x 5 power but generalized weakness.     Data Reviewed: I have personally reviewed following labs and imaging studies  CBC: Recent Labs  Lab 06/16/20 1344 06/16/20 1450 06/17/20 0603 06/21/20 0706 06/22/20 0334  WBC 12.8*  --  11.2* 12.1* 18.1*  NEUTROABS  --   --   --   --  14.8*  HGB 8.4* 8.5* 8.8* 7.4* 6.4*  HCT 27.8* 25.0* 30.8* 23.0* 19.6*  MCV 83.7  --  86.5 81.0 80.0  PLT 244  --  234 225 [id number]   Basic Metabolic Panel: Recent Labs  Lab 06/16/20 1652 06/17/20 0030 06/18/20 0107 06/18/20 0441 06/19/20 0041 06/19/20 0327 06/20/20 0413 06/20/20 [id number] 06/20/20 1202 06/21/20 0706 06/22/20 0334 06/23/20 0156  NA  --    < > 155*   < > 149*   < > 141 140 138 133* 135 137  K  --    < > 4.2  --  4.2  --  4.3  --   --  4.3 4.1 4.2  CL  --    < > 121*  --  116*  --  110  --   --  105 104 107  CO2  --    < > 23  --  22  --  22  --   --  21* 21* 19*  GLUCOSE  --    < > 229*  --  277*  --  245*  --   --  246* 171* 144*  BUN  --    < > 102*  --  88*  --  75*  --   --  66* 70* 71*  CREATININE  --    < > 6.22*  --  5.60*  --  4.75*  --   --  4.28* 4.55* 5.07*  CALCIUM  --    < > 7.8*  --  7.7*  --  7.4*  --   --  7.1* 7.4* 7.6*  MG 2.7*  --   --   --   --   --   --   --   --   --  2.1  --   PHOS  --    < > 5.6*  --  4.8*  --  3.9  --   --  3.5 4.1  --    < > = values in this interval not displayed.   GFR: Estimated Creatinine Clearance: 6.1 mL/min (A) (by C-G formula based on SCr of 5.07 mg/dL (H)). Liver Function Tests: Recent Labs  Lab 06/16/20 1344 06/17/20 0603 06/18/20 0107 06/19/20 0041 06/20/20 0413 06/21/20 0706  AST 12*  --   --   --   --   --   ALT 7  --   --   --   --   --   ALKPHOS 81  --   --   --   --   --  BILITOT 0.4  --   --   --   --   --   PROT 6.5  --   --   --   --   --   ALBUMIN 2.8* 2.7* 2.2* 2.2* 2.1* 2.0*   No results for input(s): LIPASE, AMYLASE in the last  168 hours. No results for input(s): AMMONIA in the last 168 hours. Coagulation Profile: No results for input(s): INR, PROTIME in the last 168 hours. Cardiac Enzymes: No results for input(s): CKTOTAL, CKMB, CKMBINDEX, TROPONINI in the last 168 hours. BNP (last 3 results) No results for input(s): PROBNP in the last 8760 hours. HbA1C: No results for input(s): HGBA1C in the last 72 hours. CBG: Recent Labs  Lab 06/22/20 0753 06/22/20 1229 06/22/20 1637 06/22/20 2204 06/23/20 0750  GLUCAP 140* 118* 154* 139* 122*   Lipid Profile: No results for input(s): CHOL, HDL, LDLCALC, TRIG, CHOLHDL, LDLDIRECT in the last 72 hours. Thyroid Function Tests: No results for input(s): TSH, T4TOTAL, FREET4, T3FREE, THYROIDAB in the last 72 hours. Anemia Panel: No results for input(s): VITAMINB12, FOLATE, FERRITIN, TIBC, IRON, RETICCTPCT in the last 72 hours. Sepsis Labs: Recent Labs  Lab 06/16/20 1400  LATICACIDVEN 1.4    Recent Results (from the past 240 hour(s))  Culture, blood (routine x 2)     Status: None   Collection Time: 06/16/20  2:00 PM   Specimen: BLOOD RIGHT ARM  Result Value Ref Range Status   Specimen Description BLOOD RIGHT ARM  Final   Special Requests   Final    BOTTLES DRAWN AEROBIC AND ANAEROBIC Blood Culture adequate volume   Culture   Final    NO GROWTH 5 DAYS Performed at [facility], [place] [address], [place], [place] [address]    Report Status 06/21/2020 FINAL  Final  SARS Coronavirus 2 by RT PCR (hospital order, performed in [facility] hospital lab) Nasopharyngeal Nasopharyngeal Swab     Status: None   Collection Time: 06/16/20  2:36 PM   Specimen: Nasopharyngeal Swab  Result Value Ref Range Status   SARS Coronavirus 2 NEGATIVE NEGATIVE Final    Comment: (NOTE) SARS-CoV-2 target nucleic acids are NOT DETECTED.  The SARS-CoV-2 RNA is generally detectable in upper and lower respiratory specimens during the acute phase of infection. The lowest concentration  of SARS-CoV-2 viral copies this assay can detect is 250 copies / mL. A negative result does not preclude SARS-CoV-2 infection and should not be used as the sole basis for treatment or other patient management decisions.  A negative result may occur with improper specimen collection / handling, submission of specimen other than nasopharyngeal swab, presence of viral mutation(s) within the areas targeted by this assay, and inadequate number of viral copies (<250 copies / mL). A negative result must be combined with clinical observations, patient history, and epidemiological information.  Fact Sheet for Patients:   StrictlyIdeas.no  Fact Sheet for Healthcare Providers: BankingDealers.co.za  This test is not yet approved or  cleared by the [place] FDA and has been authorized for detection and/or diagnosis of SARS-CoV-2 by FDA under an Emergency Use Authorization (EUA).  This EUA will remain in effect (meaning this test can be used) for the duration of the COVID-19 declaration under Section 564(b)(1) of the Act, 21 U.S.C. section 360bbb-3(b)(1), unless the authorization is terminated or revoked sooner.  Performed at [facility], [place] [address], [place], [place] [address]   Culture, blood (routine x 2)     Status:  None   Collection Time: 06/16/20  4:10 PM   Specimen: BLOOD  Result Value Ref Range Status   Specimen Description BLOOD SITE NOT SPECIFIED  Final   Special Requests   Final    BOTTLES DRAWN AEROBIC AND ANAEROBIC Blood Culture results may not be optimal due to an excessive volume of blood received in culture bottles   Culture   Final    NO GROWTH 5 DAYS Performed at [facility], [place] [address], [place], [place] [address]    Report Status 06/21/2020 FINAL  Final         Radiology Studies: No results found.      Scheduled Meds: . dicyclomine  20 mg Oral Daily  . feeding supplement   Oral TID    Continuous Infusions:   LOS: 6 days    Time spent: 30 minutes    Barb Merino, MD [facility] Pager [phone]

## 2020-06-24 DIAGNOSIS — Z66 Do not resuscitate: Secondary | ICD-10-CM | POA: Diagnosis not present

## 2020-06-24 DIAGNOSIS — G9341 Metabolic encephalopathy: Secondary | ICD-10-CM | POA: Diagnosis not present

## 2020-06-24 DIAGNOSIS — Z515 Encounter for palliative care: Secondary | ICD-10-CM | POA: Diagnosis not present

## 2020-06-24 DIAGNOSIS — Z7189 Other specified counseling: Secondary | ICD-10-CM | POA: Diagnosis not present

## 2020-06-24 NOTE — Progress Notes (Signed)
   Palliative Medicine Inpatient Follow Up Note  Reason for consult: Goals of Care  "goal of care , hospice appropriate"  HPI:  Per intake H&P --> 80 year old gentleman with history of dementia, stage III chronic kidney disease, traumatic brain injury, stroke, diabetes, hypertension hyperlipidemia, legally blind and chronic nursing home resident brought from nursing home with somnolence. Patient speaks very limited [place]. He does speak [place]. In the emergency room, blood pressure stable. Sodium 157. BUN 136 and creatinine 7.8. Baseline creatinine 2.3. Patient admitted to the hospital with severe dehydration.  Palliative care has been consulted to discuss goals of care.  Today's Discussion (06/24/2020):  *Please note that this is a verbal dictation therefore any spelling or grammatical errors are due to the "[place] One" system interpretation.  I met with Timothy [name] this morning at bedside. He had the sheet pulled over his head. He did not appear to be in any distress.   I spoke to patient RN, Chailendra who endorsed no concerns about Timothy [name].  At this time we are awaiting a bed at [facility]  Questions and concerns addressed   Patients MD, RN, MSW were updated on the plan for comfort transition.  Objective Assessment: Vital Signs Vitals:   06/23/20 2118 06/24/20 0847  BP: (!) 113/58 (!) 139/55  Pulse: 68 63  Resp: 16   Temp: 98.1 F (36.7 C) 97.7 F (36.5 C)  SpO2: 100% 100%    Intake/Output Summary (Last 24 hours) at 06/24/2020 1159 Last data filed at 06/24/2020 5809 Gross per 24 hour  Intake --  Output 300 ml  Net -300 ml   Last Weight  Most recent update: 06/22/2020  5:04 AM   Weight  38 kg (83 lb 12.4 oz)           Gen:  Frail, cachectic vietnamese M in NAD HEENT: Poor dentition, dry mucous membranes CV: Regular rate and rhythm  PULM: clear to auscultation bilaterally  ABD: soft/nontender  EXT: No edema  Neuro: Somnolent  SUMMARY OF  RECOMMENDATIONS   DNAR/DNI  Comfort focused care - Medications per [facility]  Liberalize visitation policy  TOC -->[facility]  Time Spent: 15 Greater than 50% of the time was spent in counseling and coordination of care ______________________________________________________________________________________ [place] Team Team Cell Phone: [phone] Please utilize secure chat with additional questions, if there is no response within 30 minutes please call the above phone number  Palliative Medicine Team providers are available by phone from 7am to 7pm daily and can be reached through the team cell phone.  Should this patient require assistance outside of these hours, please call the patient's attending physician.

## 2020-06-24 NOTE — Progress Notes (Signed)
[facility] ([facility])  Pt Timothy [name] was referred to [facility] for [facility]. At this time [facility] MD approved him appropriate for Hospice but not eligible for [facility] place.   [facility] will continue to follow up with any discharge plans.   Thank you, Clementeen Hoof, BSN, RN [phone]

## 2020-06-24 NOTE — Progress Notes (Signed)
PROGRESS NOTE    Timothy [name]  [id number] DOB: 12/11/1940 DOA: 06/16/2020 PCP: [name], PA-C    Brief Narrative:  80 year old gentleman with history of dementia, stage III chronic kidney disease, traumatic brain injury, stroke, diabetes, hypertension hyperlipidemia, legally blind and chronic nursing home resident brought from nursing home with somnolence.  Patient speaks very limited [place].  He does speak [place]. In the emergency room, blood pressure stable.  Sodium 157.  BUN 136 and creatinine 7.8.  Baseline creatinine 2.3.  Patient admitted to the hospital with severe dehydration. Patient's recovery remain poor.  He is not eating and drinking.  Palliative and hospice discussion initiated and family and friends agreed for comfort care and hospice on 2/5. Refer to inpatient hospice admission.   Assessment & Plan:   Principal Problem:   Acute metabolic encephalopathy Active Problems:   Anemia of chronic disease   DM (diabetes mellitus), type 2 with renal complications (HCC)   Acute kidney injury superimposed on chronic kidney disease (HCC)   Legally blind   Hypertension   History of CVA (cerebrovascular accident)   Dementia ([place])   Dyslipidemia   Uremia   Hypernatremia  Severe dehydration/hypernatremia/AKI on CKD stage IV: Significant free water deficit.   Acute metabolic encephalopathy with history of dementia and traumatic brain injury, extreme debilitated. Severe protein calorie malnutrition Acute on chronic anemia of chronic kidney disease Type 2 diabetes End-of-life care  Plan: Patient with extreme debility, hypernatremia, progressive renal failure nearing end-of-life. Palliative care meeting with family's, friends from church and available community on 2/5. Comfort care and hospice. Refer to inpatient hospice. All symptom control medications available.  Do not escalate care.  No lab draws. RN to pronounce death if happens. Stable to transfer to  inpatient hospice if bed available.   DVT prophylaxis: Comfort care   Code Status: DNR/DNI Family Communication: Multiple with palliative disposition Plan: Status is: Inpatient  Remains inpatient appropriate because:Inpatient level of care appropriate due to severity of illness   Dispo: The patient is from: SNF              Anticipated d/c is to: Inpatient hospice              Anticipated d/c date is: When bed available              Patient currently is not medically stable to d/c.  He is stable to transfer to hospice level of care.   Difficult to place patient No   Consultants:   Palliative care   Procedures:   None  Antimicrobials:   None   Subjective: Patient seen and examined.  He will respond, greet back.  He will just closes eyes and curled up in the bed.  Looks comfortable.  When helped, intermittently eat some food.  Nursing reported no other concerns.  Patient denies any pain or discomfort.  Objective: Vitals:   06/23/20 0927 06/23/20 0927 06/23/20 2118 06/24/20 0847  BP: (!) 129/51 (!) 129/51 (!) 113/58 (!) 139/55  Pulse:  69 68 63  Resp:  16 16   Temp:  97.8 F (36.6 C) 98.1 F (36.7 C) 97.7 F (36.5 C)  TempSrc:   Oral Oral  SpO2:  100% 100% 100%  Weight:      Height:        Intake/Output Summary (Last 24 hours) at 06/24/2020 1100 Last data filed at 06/24/2020 [id number] Gross per 24 hour  Intake -  Output 300 ml  Net -300 ml  Filed Weights   06/20/20 0500 06/21/20 0451 06/22/20 0500  Weight: 38.7 kg 38.4 kg 38 kg    Examination:  General exam: Chronically sick looking.  Frail and cachectic gentleman lying in bed. Patient is alert when stimulated.  Otherwise he just lays in the bed, eyes closed and blankets drawn. Respiratory system: Clear to auscultation. Respiratory effort normal.  No added sounds. Cardiovascular system: S1 & S2 heard, RRR. No JVD, murmurs, rubs, gallops or clicks. No pedal edema. Gastrointestinal system: Abdomen is  nondistended, soft and nontender. No organomegaly or masses felt. Normal bowel sounds heard. Central nervous system: Moves all extremities. Extremities: Symmetric 5 x 5 power but generalized weakness.     Data Reviewed: I have personally reviewed following labs and imaging studies  CBC: Recent Labs  Lab 06/21/20 0706 06/22/20 0334  WBC 12.1* 18.1*  NEUTROABS  --  14.8*  HGB 7.4* 6.4*  HCT 23.0* 19.6*  MCV 81.0 80.0  PLT 225 [id number]   Basic Metabolic Panel: Recent Labs  Lab 06/18/20 0107 06/18/20 0441 06/19/20 0041 06/19/20 0327 06/20/20 0413 06/20/20 0812 06/20/20 1202 06/21/20 0706 06/22/20 0334 06/23/20 0156  NA 155*   < > 149*   < > 141 140 138 133* 135 137  K 4.2  --  4.2  --  4.3  --   --  4.3 4.1 4.2  CL 121*  --  116*  --  110  --   --  105 104 107  CO2 23  --  22  --  22  --   --  21* 21* 19*  GLUCOSE 229*  --  277*  --  245*  --   --  246* 171* 144*  BUN 102*  --  88*  --  75*  --   --  66* 70* 71*  CREATININE 6.22*  --  5.60*  --  4.75*  --   --  4.28* 4.55* 5.07*  CALCIUM 7.8*  --  7.7*  --  7.4*  --   --  7.1* 7.4* 7.6*  MG  --   --   --   --   --   --   --   --  2.1  --   PHOS 5.6*  --  4.8*  --  3.9  --   --  3.5 4.1  --    < > = values in this interval not displayed.   GFR: Estimated Creatinine Clearance: 6.1 mL/min (A) (by C-G formula based on SCr of 5.07 mg/dL (H)). Liver Function Tests: Recent Labs  Lab 06/18/20 0107 06/19/20 0041 06/20/20 0413 06/21/20 0706  ALBUMIN 2.2* 2.2* 2.1* 2.0*   No results for input(s): LIPASE, AMYLASE in the last 168 hours. No results for input(s): AMMONIA in the last 168 hours. Coagulation Profile: No results for input(s): INR, PROTIME in the last 168 hours. Cardiac Enzymes: No results for input(s): CKTOTAL, CKMB, CKMBINDEX, TROPONINI in the last 168 hours. BNP (last 3 results) No results for input(s): PROBNP in the last 8760 hours. HbA1C: No results for input(s): HGBA1C in the last 72 hours. CBG: Recent  Labs  Lab 06/22/20 0753 06/22/20 1229 06/22/20 1637 06/22/20 2204 06/23/20 0750  GLUCAP 140* 118* 154* 139* 122*   Lipid Profile: No results for input(s): CHOL, HDL, LDLCALC, TRIG, CHOLHDL, LDLDIRECT in the last 72 hours. Thyroid Function Tests: No results for input(s): TSH, T4TOTAL, FREET4, T3FREE, THYROIDAB in the last 72 hours. Anemia Panel: No results for input(s): VITAMINB12, FOLATE,  FERRITIN, TIBC, IRON, RETICCTPCT in the last 72 hours. Sepsis Labs: No results for input(s): PROCALCITON, LATICACIDVEN in the last 168 hours.  Recent Results (from the past 240 hour(s))  Culture, blood (routine x 2)     Status: None   Collection Time: 06/16/20  2:00 PM   Specimen: BLOOD RIGHT ARM  Result Value Ref Range Status   Specimen Description BLOOD RIGHT ARM  Final   Special Requests   Final    BOTTLES DRAWN AEROBIC AND ANAEROBIC Blood Culture adequate volume   Culture   Final    NO GROWTH 5 DAYS Performed at [facility], [address] [place], [place] [address]    Report Status 06/21/2020 FINAL  Final  SARS Coronavirus 2 by RT PCR (hospital order, performed in [facility] hospital lab) Nasopharyngeal Nasopharyngeal Swab     Status: None   Collection Time: 06/16/20  2:36 PM   Specimen: Nasopharyngeal Swab  Result Value Ref Range Status   SARS Coronavirus 2 NEGATIVE NEGATIVE Final    Comment: (NOTE) SARS-CoV-2 target nucleic acids are NOT DETECTED.  The SARS-CoV-2 RNA is generally detectable in upper and lower respiratory specimens during the acute phase of infection. The lowest concentration of SARS-CoV-2 viral copies this assay can detect is 250 copies / mL. A negative result does not preclude SARS-CoV-2 infection and should not be used as the sole basis for treatment or other patient management decisions.  A negative result may occur with improper specimen collection / handling, submission of specimen other than nasopharyngeal swab, presence of viral mutation(s) within  the areas targeted by this assay, and inadequate number of viral copies (<250 copies / mL). A negative result must be combined with clinical observations, patient history, and epidemiological information.  Fact Sheet for Patients:   StrictlyIdeas.no  Fact Sheet for Healthcare Providers: BankingDealers.co.za  This test is not yet approved or  cleared by the [place] FDA and has been authorized for detection and/or diagnosis of SARS-CoV-2 by FDA under an Emergency Use Authorization (EUA).  This EUA will remain in effect (meaning this test can be used) for the duration of the COVID-19 declaration under Section 564(b)(1) of the Act, 21 U.S.C. section 360bbb-3(b)(1), unless the authorization is terminated or revoked sooner.  Performed at [facility], [place] [address] [place], [place] [address]   Culture, blood (routine x 2)     Status: None   Collection Time: 06/16/20  4:10 PM   Specimen: BLOOD  Result Value Ref Range Status   Specimen Description BLOOD SITE NOT SPECIFIED  Final   Special Requests   Final    BOTTLES DRAWN AEROBIC AND ANAEROBIC Blood Culture results may not be optimal due to an excessive volume of blood received in culture bottles   Culture   Final    NO GROWTH 5 DAYS Performed at [facility], [place] [address] [place], [place] [address]    Report Status 06/21/2020 FINAL  Final         Radiology Studies: No results found.      Scheduled Meds: . dicyclomine  20 mg Oral Daily  . feeding supplement   Oral TID   Continuous Infusions:   LOS: 7 days    Time spent: 30 minutes    Barb Merino, MD [facility] Pager [phone]

## 2020-06-25 DIAGNOSIS — G9341 Metabolic encephalopathy: Secondary | ICD-10-CM | POA: Diagnosis not present

## 2020-06-25 MED ORDER — HYPROMELLOSE (GONIOSCOPIC) 2.5 % OP SOLN
1.0000 [drp] | OPHTHALMIC | Status: DC | PRN
  Filled 2020-06-25: qty 15

## 2020-06-25 NOTE — Progress Notes (Signed)
PROGRESS NOTE    Timothy [name]  [id number] DOB: 28-Sep-1940 DOA: 06/16/2020 PCP: [name], PA-C    Brief Narrative:  80 year old gentleman with history of dementia, stage III chronic kidney disease, traumatic brain injury, stroke, diabetes, hypertension hyperlipidemia, legally blind from ALF brought with somnolence.  Patient speaks limited [place].  He does speak [place] a [place]et[place] dialect.  In the emergency room, blood pressure stable.  Sodium 157.  BUN 136 and creatinine 7.8.  Baseline creatinine 2.3.  Patient admitted to the hospital with severe dehydration. Patient's recovery remained poor.  He is not eating and drinking.  Palliative and hospice discussion initiated and family and friends agreed for comfort care and hospice on 2/5.  Assessment & Plan:   Principal Problem:   Acute metabolic encephalopathy Active Problems:   Anemia of chronic disease   DM (diabetes mellitus), type 2 with renal complications (HCC)   Acute kidney injury superimposed on chronic kidney disease (HCC)   Legally blind   Hypertension   History of CVA (cerebrovascular accident)   Dementia ([place])   Dyslipidemia   Uremia   Hypernatremia  Severe dehydration/hypernatremia/AKI on CKD stage IV: Significant free water deficit.   Acute metabolic encephalopathy with history of dementia and traumatic brain injury, extreme debilitated. Severe protein calorie malnutrition Acute on chronic anemia of chronic kidney disease Type 2 diabetes End-of-life care  Plan: Patient with extreme debility, hypernatremia, progressive renal failure nearing end-of-life. Comfort care and hospice. Refer to inpatient hospice, however patient is deemed not needing acute inpatient hospice. All symptom control medications available.  Do not escalate care.  No lab draws. RN to pronounce death if happens. His assisted living facility should be able to offer hospice facility. He can be transferred back to ALF with hospice  in place.   DVT prophylaxis: Comfort care   Code Status: DNR/DNI Family Communication: Multiple with palliative disposition Plan: Status is: Inpatient  Remains inpatient appropriate because:Inpatient level of care appropriate due to severity of illness   Dispo: The patient is from: ALF              Anticipated d/c is to: Inpatient hospice vs hospice at ALF               Anticipated d/c date is: When bed available              Patient currently is not medically stable to d/c.  He is stable to transfer to hospice level of care.   Difficult to place patient No   Consultants:   Palliative care   Procedures:   None  Antimicrobials:   None   Subjective: Patient was seen and examined.  No overnight events.  Today he was more responsive.  He greeted back.  Nursing reported that he was able to eat some food with help. Patient himself is not able to offer any complaints.  He says he is doing fine.  Objective: Vitals:   06/23/20 0927 06/23/20 0927 06/23/20 2118 06/24/20 0847  BP: (!) 129/51 (!) 129/51 (!) 113/58 (!) 139/55  Pulse:  69 68 63  Resp:  16 16   Temp:  97.8 F (36.6 C) 98.1 F (36.7 C) 97.7 F (36.5 C)  TempSrc:   Oral Oral  SpO2:  100% 100% 100%  Weight:      Height:        Intake/Output Summary (Last 24 hours) at 06/25/2020 1149 Last data filed at 06/25/2020 0305 Gross per 24 hour  Intake --  Output 1250 ml  Net -1250 ml   Filed Weights   06/20/20 0500 06/21/20 0451 06/22/20 0500  Weight: 38.7 kg 38.4 kg 38 kg    Examination:  General exam: Chronically sick looking.  Frail and cachectic gentleman lying in bed. Patient is alert on talking . Otherwise he just lays in the bed, eyes closed and blankets drawn. Respiratory system: Clear to auscultation. Respiratory effort normal.  No added sounds. Cardiovascular system: S1 & S2 heard, RRR. No JVD, murmurs, rubs, gallops or clicks. No pedal edema. Gastrointestinal system: Abdomen is nondistended, soft and  nontender. No organomegaly or masses felt. Normal bowel sounds heard. Central nervous system: Moves all extremities. Extremities: Symmetric 5 x 5 power but generalized weakness.     Data Reviewed: I have personally reviewed following labs and imaging studies  CBC: Recent Labs  Lab 06/21/20 0706 06/22/20 0334  WBC 12.1* 18.1*  NEUTROABS  --  14.8*  HGB 7.4* 6.4*  HCT 23.0* 19.6*  MCV 81.0 80.0  PLT 225 [id number]   Basic Metabolic Panel: Recent Labs  Lab 06/19/20 0041 06/19/20 0327 06/20/20 0413 06/20/20 0812 06/20/20 1202 06/21/20 0706 06/22/20 0334 06/23/20 0156  NA 149*   < > 141 140 138 133* 135 137  K 4.2  --  4.3  --   --  4.3 4.1 4.2  CL 116*  --  110  --   --  105 104 107  CO2 22  --  22  --   --  21* 21* 19*  GLUCOSE 277*  --  245*  --   --  246* 171* 144*  BUN 88*  --  75*  --   --  66* 70* 71*  CREATININE 5.60*  --  4.75*  --   --  4.28* 4.55* 5.07*  CALCIUM 7.7*  --  7.4*  --   --  7.1* 7.4* 7.6*  MG  --   --   --   --   --   --  2.1  --   PHOS 4.8*  --  3.9  --   --  3.5 4.1  --    < > = values in this interval not displayed.   GFR: Estimated Creatinine Clearance: 6.1 mL/min (A) (by C-G formula based on SCr of 5.07 mg/dL (H)). Liver Function Tests: Recent Labs  Lab 06/19/20 0041 06/20/20 0413 06/21/20 0706  ALBUMIN 2.2* 2.1* 2.0*   No results for input(s): LIPASE, AMYLASE in the last 168 hours. No results for input(s): AMMONIA in the last 168 hours. Coagulation Profile: No results for input(s): INR, PROTIME in the last 168 hours. Cardiac Enzymes: No results for input(s): CKTOTAL, CKMB, CKMBINDEX, TROPONINI in the last 168 hours. BNP (last 3 results) No results for input(s): PROBNP in the last 8760 hours. HbA1C: No results for input(s): HGBA1C in the last 72 hours. CBG: Recent Labs  Lab 06/22/20 0753 06/22/20 1229 06/22/20 1637 06/22/20 2204 06/23/20 0750  GLUCAP 140* 118* 154* 139* 122*   Lipid Profile: No results for input(s): CHOL,  HDL, LDLCALC, TRIG, CHOLHDL, LDLDIRECT in the last 72 hours. Thyroid Function Tests: No results for input(s): TSH, T4TOTAL, FREET4, T3FREE, THYROIDAB in the last 72 hours. Anemia Panel: No results for input(s): VITAMINB12, FOLATE, FERRITIN, TIBC, IRON, RETICCTPCT in the last 72 hours. Sepsis Labs: No results for input(s): PROCALCITON, LATICACIDVEN in the last 168 hours.  Recent Results (from the past 240 hour(s))  Culture, blood (routine x 2)     Status:  None   Collection Time: 06/16/20  2:00 PM   Specimen: BLOOD RIGHT ARM  Result Value Ref Range Status   Specimen Description BLOOD RIGHT ARM  Final   Special Requests   Final    BOTTLES DRAWN AEROBIC AND ANAEROBIC Blood Culture adequate volume   Culture   Final    NO GROWTH 5 DAYS Performed at [facility], [address] [place], [place] [address]    Report Status 06/21/2020 FINAL  Final  SARS Coronavirus 2 by RT PCR (hospital order, performed in [facility] hospital lab) Nasopharyngeal Nasopharyngeal Swab     Status: None   Collection Time: 06/16/20  2:36 PM   Specimen: Nasopharyngeal Swab  Result Value Ref Range Status   SARS Coronavirus 2 NEGATIVE NEGATIVE Final    Comment: (NOTE) SARS-CoV-2 target nucleic acids are NOT DETECTED.  The SARS-CoV-2 RNA is generally detectable in upper and lower respiratory specimens during the acute phase of infection. The lowest concentration of SARS-CoV-2 viral copies this assay can detect is 250 copies / mL. A negative result does not preclude SARS-CoV-2 infection and should not be used as the sole basis for treatment or other patient management decisions.  A negative result may occur with improper specimen collection / handling, submission of specimen other than nasopharyngeal swab, presence of viral mutation(s) within the areas targeted by this assay, and inadequate number of viral copies (<250 copies / mL). A negative result must be combined with clinical observations, patient  history, and epidemiological information.  Fact Sheet for Patients:   StrictlyIdeas.no  Fact Sheet for Healthcare Providers: BankingDealers.co.za  This test is not yet approved or  cleared by the [place] FDA and has been authorized for detection and/or diagnosis of SARS-CoV-2 by FDA under an Emergency Use Authorization (EUA).  This EUA will remain in effect (meaning this test can be used) for the duration of the COVID-19 declaration under Section 564(b)(1) of the Act, 21 U.S.C. section 360bbb-3(b)(1), unless the authorization is terminated or revoked sooner.  Performed at [facility], [place] [address] [place], [place] [address]   Culture, blood (routine x 2)     Status: None   Collection Time: 06/16/20  4:10 PM   Specimen: BLOOD  Result Value Ref Range Status   Specimen Description BLOOD SITE NOT SPECIFIED  Final   Special Requests   Final    BOTTLES DRAWN AEROBIC AND ANAEROBIC Blood Culture results may not be optimal due to an excessive volume of blood received in culture bottles   Culture   Final    NO GROWTH 5 DAYS Performed at [facility], [place] [address] [place], [place] [address]    Report Status 06/21/2020 FINAL  Final         Radiology Studies: No results found.      Scheduled Meds: . dicyclomine  20 mg Oral Daily  . feeding supplement   Oral TID   Continuous Infusions:   LOS: 8 days    Time spent: 30 minutes    Barb Merino, MD [facility] Pager [phone]

## 2020-06-25 NOTE — Progress Notes (Signed)
Palliative Medicine RN Note: Symptom check.  Niece is at bedside. She is feeding the pt. While he did drink a whole cup of tea, he spat out the food over the side of the bed after he "rinsed" it in his mouth. Family is attempting to bring food from home because he likes it more.   His family helped me ask if he was hurting or having trouble breathing, both of which he denied. He did complain that his eyes are hurting. He had thick eye ointment at bedside; obtained order for artificial tears.   I will follow up for placement and comfort tomorrow.  Marjie Skiff Ella Golomb, RN, BSN, [facility] Palliative Medicine Team 06/25/2020 4:10 PM Office [phone]

## 2020-06-25 NOTE — TOC Progression Note (Addendum)
Transition of Care [facility]) - Progression Note    Patient Details  Name: Timothy [name] MRN: [id number] Date of Birth: [birth year]  Transition of Care [facility]) CM/SW [place], RN Phone Number: [phone]  06/25/2020, 10:58 AM  Clinical Narrative:    CM received message from Md to inquire if patient is able to go back to NH with Hospice services. Patient is from [facility] which is assisted living. CM has called and left message. Will await return call. Bed search previously initiated for long term SNF bed. Currently the only bed offer is [facility]. CM called Otila Kluver at [facility] to determine if bed is still available. Otila Kluver explained to CM that [place] can not extend bed offer due to language barrier  and that patient would require an interpreter and that family is not allowed to be the interpreter. CM has explained to Otila Kluver that the hospital does not send interpreters to the facility. [facility] declined bed offer at this time.   1130 CM spoke with Janie Morning SW at [facility]. [name] states that the association will be able to help with some interpretation but will need advance notice and it will not be on a daily basis. [name] states that the niece  is fluent in [place] and does not need an interpreter for communication. TOC will continue to follow.    Expected Discharge Plan: [place] Barriers to Discharge: Continued Medical Work up,Insurance Authorization  Expected Discharge Plan and Services Expected Discharge Plan: [place] arrangements for the past 2 months: [place] ([place])                                       Social Determinants of Health (SDOH) Interventions    Readmission Risk Interventions No flowsheet data found.

## 2020-06-26 DIAGNOSIS — Z515 Encounter for palliative care: Secondary | ICD-10-CM | POA: Diagnosis not present

## 2020-06-26 DIAGNOSIS — Z66 Do not resuscitate: Secondary | ICD-10-CM | POA: Diagnosis not present

## 2020-06-26 DIAGNOSIS — Z7189 Other specified counseling: Secondary | ICD-10-CM

## 2020-06-26 NOTE — Progress Notes (Signed)
[facility])        This patient has been referred for hospice services in the community.  Report exchanged today with bedside RN who reports pt will drink one ensure/day as well as a few bite of fruit. Based on this continued poor but not insubstantial intake pt is believed to have a life expectancy of >2 weeks and not to be a candidate for residential hospice at this time.  This pt is a better fit for home hospice services.  [facility] will continue to follow for any discharge planning needs and to coordinate admission onto hospice care.   Thank you for the opportunity to participate in this patient's care.     Domenic Moras, BSN, RN [facility] Liaison   [phone] (24h on call)

## 2020-06-26 NOTE — Plan of Care (Signed)
  Problem: Clinical Measurements: Goal: Cardiovascular complication will be avoided Outcome: Progressing   Problem: Coping: Goal: Level of anxiety will decrease Outcome: Progressing   Problem: Pain Managment: Goal: General experience of comfort will improve Outcome: Progressing   Problem: Safety: Goal: Ability to remain free from injury will improve Outcome: Progressing

## 2020-06-26 NOTE — Progress Notes (Signed)
PROGRESS NOTE    Timothy [name]  [id number] DOB: 1940/12/28 DOA: 06/16/2020 PCP: [name], PA-C    Brief Narrative:  80 year old gentleman with history of dementia, stage III chronic kidney disease, traumatic brain injury, stroke, diabetes, hypertension hyperlipidemia, legally blind from ALF brought with somnolence.  Patient speaks limited [place].  He does speak [place] a vietnamese dialect. In the emergency room, blood pressure stable.  Sodium 157.  BUN 136 and creatinine 7.8.  Baseline creatinine 2.3.  Patient admitted to the hospital with severe dehydration.  Patient's recovery remained poor.  He is not eating and drinking.  Palliative and hospice discussion initiated and family and friends agreed for comfort care and hospice on 2/5. Waiting for placement as he did not qualify for inpatient hospice.   Assessment & Plan:   Principal Problem:   Acute metabolic encephalopathy Active Problems:   Anemia of chronic disease   DM (diabetes mellitus), type 2 with renal complications (HCC)   Acute kidney injury superimposed on chronic kidney disease (HCC)   Legally blind   Hypertension   History of CVA (cerebrovascular accident)   Dementia ([place])   Dyslipidemia   Uremia   Hypernatremia  Severe dehydration/hypernatremia/AKI on CKD stage IV: Significant free water deficit.   Acute metabolic encephalopathy with history of dementia and traumatic brain injury, extreme debilitated. Severe protein calorie malnutrition Acute on chronic anemia of chronic kidney disease Type 2 diabetes End-of-life care  Plan: Patient with extreme debility, hypernatremia, progressive renal failure nearing end-of-life. Comfort care and hospice. Refer to inpatient hospice, however patient is deemed not needing acute inpatient hospice. All symptom control medications available.  Do not escalate care.  No lab draws. RN to pronounce death if happens. His assisted living facility should be able to  offer hospice facility. He can be transferred back to ALF with hospice in place or may need to find a SNF with hospice services.   DVT prophylaxis: Comfort care   Code Status: DNR/DNI Family Communication: Multiple with palliative disposition Plan: Status is: Inpatient  Remains inpatient appropriate because:Inpatient level of care appropriate due to severity of illness   Dispo: The patient is from: ALF              Anticipated d/c is to: Inpatient hospice vs hospice at ALF               Anticipated d/c date is: When bed available              Patient currently is not medically stable to d/c.  He is stable to transfer to hospice level of care.   Difficult to place patient No   Consultants:   Palliative care   Procedures:   None  Antimicrobials:   None   Subjective: Patient seen and examined. No overnight events. Remains very comfortable. He is actually able to greet back good morning to me. He says he is doing fine. Does not have further communication. He speaks limited [place].  Objective: Vitals:   06/23/20 0927 06/23/20 2118 06/24/20 0847 06/25/20 1957  BP: (!) 129/51 (!) 113/58 (!) 139/55 (!) 97/24  Pulse: 69 68 63 67  Resp: '16 16  18  '$ Temp: 97.8 F (36.6 C) 98.1 F (36.7 C) 97.7 F (36.5 C) 99.2 F (37.3 C)  TempSrc:  Oral Oral Axillary  SpO2: 100% 100% 100% 96%  Weight:      Height:        Intake/Output Summary (Last 24 hours) at 06/26/2020 0916 Last  data filed at 06/26/2020 0345 Gross per 24 hour  Intake 240 ml  Output 1350 ml  Net -1110 ml   Filed Weights   06/20/20 0500 06/21/20 0451 06/22/20 0500  Weight: 38.7 kg 38.4 kg 38 kg    Examination:  General exam: Chronically sick looking.  Frail and cachectic gentleman lying in bed. Patient is alert on talking . Otherwise he just lays in the bed, eyes closed and blankets drawn. Respiratory system: Clear to auscultation. Respiratory effort normal.  No added sounds. Cardiovascular system: S1 & S2  heard, RRR. No JVD, murmurs, rubs, gallops or clicks. No pedal edema. Gastrointestinal system: Abdomen is nondistended, soft and nontender. No organomegaly or masses felt. Normal bowel sounds heard. Central nervous system: Moves all extremities. Extremities: Symmetric 5 x 5 power but generalized weakness.     Data Reviewed: I have personally reviewed following labs and imaging studies  CBC: Recent Labs  Lab 06/21/20 0706 06/22/20 0334  WBC 12.1* 18.1*  NEUTROABS  --  14.8*  HGB 7.4* 6.4*  HCT 23.0* 19.6*  MCV 81.0 80.0  PLT 225 [id number]   Basic Metabolic Panel: Recent Labs  Lab 06/20/20 0413 06/20/20 0812 06/20/20 1202 06/21/20 0706 06/22/20 0334 06/23/20 0156  NA 141 140 138 133* 135 137  K 4.3  --   --  4.3 4.1 4.2  CL 110  --   --  105 104 107  CO2 22  --   --  21* 21* 19*  GLUCOSE 245*  --   --  246* 171* 144*  BUN 75*  --   --  66* 70* 71*  CREATININE 4.75*  --   --  4.28* 4.55* 5.07*  CALCIUM 7.4*  --   --  7.1* 7.4* 7.6*  MG  --   --   --   --  2.1  --   PHOS 3.9  --   --  3.5 4.1  --    GFR: Estimated Creatinine Clearance: 6.1 mL/min (A) (by C-G formula based on SCr of 5.07 mg/dL (H)). Liver Function Tests: Recent Labs  Lab 06/20/20 0413 06/21/20 0706  ALBUMIN 2.1* 2.0*   No results for input(s): LIPASE, AMYLASE in the last 168 hours. No results for input(s): AMMONIA in the last 168 hours. Coagulation Profile: No results for input(s): INR, PROTIME in the last 168 hours. Cardiac Enzymes: No results for input(s): CKTOTAL, CKMB, CKMBINDEX, TROPONINI in the last 168 hours. BNP (last 3 results) No results for input(s): PROBNP in the last 8760 hours. HbA1C: No results for input(s): HGBA1C in the last 72 hours. CBG: Recent Labs  Lab 06/22/20 0753 06/22/20 1229 06/22/20 1637 06/22/20 2204 06/23/20 0750  GLUCAP 140* 118* 154* 139* 122*   Lipid Profile: No results for input(s): CHOL, HDL, LDLCALC, TRIG, CHOLHDL, LDLDIRECT in the last 72 hours. Thyroid  Function Tests: No results for input(s): TSH, T4TOTAL, FREET4, T3FREE, THYROIDAB in the last 72 hours. Anemia Panel: No results for input(s): VITAMINB12, FOLATE, FERRITIN, TIBC, IRON, RETICCTPCT in the last 72 hours. Sepsis Labs: No results for input(s): PROCALCITON, LATICACIDVEN in the last 168 hours.  Recent Results (from the past 240 hour(s))  Culture, blood (routine x 2)     Status: None   Collection Time: 06/16/20  2:00 PM   Specimen: BLOOD RIGHT ARM  Result Value Ref Range Status   Specimen Description BLOOD RIGHT ARM  Final   Special Requests   Final    BOTTLES DRAWN AEROBIC AND ANAEROBIC Blood Culture  adequate volume   Culture   Final    NO GROWTH 5 DAYS Performed at [facility], [place] [address], [place], [place] [address]    Report Status 06/21/2020 FINAL  Final  SARS Coronavirus 2 by RT PCR (hospital order, performed in [facility] hospital lab) Nasopharyngeal Nasopharyngeal Swab     Status: None   Collection Time: 06/16/20  2:36 PM   Specimen: Nasopharyngeal Swab  Result Value Ref Range Status   SARS Coronavirus 2 NEGATIVE NEGATIVE Final    Comment: (NOTE) SARS-CoV-2 target nucleic acids are NOT DETECTED.  The SARS-CoV-2 RNA is generally detectable in upper and lower respiratory specimens during the acute phase of infection. The lowest concentration of SARS-CoV-2 viral copies this assay can detect is 250 copies / mL. A negative result does not preclude SARS-CoV-2 infection and should not be used as the sole basis for treatment or other patient management decisions.  A negative result may occur with improper specimen collection / handling, submission of specimen other than nasopharyngeal swab, presence of viral mutation(s) within the areas targeted by this assay, and inadequate number of viral copies (<250 copies / mL). A negative result must be combined with clinical observations, patient history, and epidemiological information.  Fact Sheet for Patients:    StrictlyIdeas.no  Fact Sheet for Healthcare Providers: BankingDealers.co.za  This test is not yet approved or  cleared by the [place] FDA and has been authorized for detection and/or diagnosis of SARS-CoV-2 by FDA under an Emergency Use Authorization (EUA).  This EUA will remain in effect (meaning this test can be used) for the duration of the COVID-19 declaration under Section 564(b)(1) of the Act, 21 U.S.C. section 360bbb-3(b)(1), unless the authorization is terminated or revoked sooner.  Performed at [facility], [place] [address], [place], [place] [address]   Culture, blood (routine x 2)     Status: None   Collection Time: 06/16/20  4:10 PM   Specimen: BLOOD  Result Value Ref Range Status   Specimen Description BLOOD SITE NOT SPECIFIED  Final   Special Requests   Final    BOTTLES DRAWN AEROBIC AND ANAEROBIC Blood Culture results may not be optimal due to an excessive volume of blood received in culture bottles   Culture   Final    NO GROWTH 5 DAYS Performed at [facility], [place] [address] [place], [place] [address]    Report Status 06/21/2020 FINAL  Final         Radiology Studies: No results found.      Scheduled Meds: . dicyclomine  20 mg Oral Daily  . feeding supplement   Oral TID   Continuous Infusions:   LOS: 9 days    Time spent: 20 minutes    Barb Merino, MD [facility] Pager [phone]

## 2020-06-27 DIAGNOSIS — I10 Essential (primary) hypertension: Secondary | ICD-10-CM

## 2020-06-27 DIAGNOSIS — Z515 Encounter for palliative care: Secondary | ICD-10-CM

## 2020-06-27 DIAGNOSIS — H548 Legal blindness, as defined in USA: Secondary | ICD-10-CM

## 2020-06-27 DIAGNOSIS — F0391 Unspecified dementia with behavioral disturbance: Secondary | ICD-10-CM

## 2020-06-27 DIAGNOSIS — N19 Unspecified kidney failure: Secondary | ICD-10-CM

## 2020-06-27 DIAGNOSIS — E1121 Type 2 diabetes mellitus with diabetic nephropathy: Secondary | ICD-10-CM

## 2020-06-27 DIAGNOSIS — E870 Hyperosmolality and hypernatremia: Principal | ICD-10-CM

## 2020-06-27 DIAGNOSIS — E785 Hyperlipidemia, unspecified: Secondary | ICD-10-CM

## 2020-06-27 NOTE — Plan of Care (Signed)
Patient remains free from falls. Safey precautions maintained.

## 2020-06-27 NOTE — TOC Progression Note (Addendum)
Transition of Care [facility]) - Progression Note    Patient Details  Name: Timothy [name] MRN: [id number] Date of Birth: 1941-04-04  Transition of Care [facility]) CM/SW [place], RN Phone Number: [phone]  06/27/2020, 9:52 AM  Clinical Narrative:    CM called [facility] to determine if patient is able to return to AL with hospice. Facility  states that patient may be able to return to the facility. CM to fax new FL2 and progress notes to determine if patient is eligible to return. Info has been faxed. CM will follow up.  [place] FL2 has been completed and on chart for MD signature. This FL2 is what the facility is requesting. It will need to be faxed to Cruzita Lederer once MD signs. New FL2 has also been updated and in progress notes. Copy of medication list will be needed in addition to the paper Adult Care FL2.  Contact person at [employer] is Hydia [phone]. Message has been left for Hydia making her aware that the paperwork is to be completed and faxed [phone].    Expected Discharge Plan: [place] Barriers to Discharge: Continued Medical Work up,Insurance Authorization  Expected Discharge Plan and Services Expected Discharge Plan: [place] arrangements for the past 2 months: [place] ([facility])                                       Social Determinants of Health (SDOH) Interventions    Readmission Risk Interventions No flowsheet data found.

## 2020-06-27 NOTE — [place] FL2 (Addendum)
MEDICAID FL2 LEVEL OF CARE SCREENING TOOL     IDENTIFICATION  Patient Name: Timothy [name] Birthdate: 11/17/1940 Sex: male Admission Date (Current Location): 06/16/2020  [facility] and [place] Number:  Herbalist and Address:  [facility] [place], [place] [address], [place], [place] [address]      Provider Number: [id number]  Attending Physician Name and Address:  Kerney Elbe, DO  Relative Name and Phone Number:  Janie Morning, MSW [phone]    Current Level of Care: Hospital Recommended Level of Care: [place] Prior Approval Number:    Date Approved/Denied:   PASRR Number: [id number]  Discharge Plan: SNF    Current Diagnoses: Patient Active Problem List   Diagnosis Date Noted  . Terminal care 06/27/2020  . End of life care 06/27/2020  . Acute metabolic encephalopathy [id number]  . Uremia 06/16/2020  . Hypernatremia 06/16/2020  . Dyslipidemia 04/12/2018  . Dementia ([place])   . Anemia   . TIA (transient ischemic attack) 02/20/2018  . CKD (chronic kidney disease), stage III ([place]) 01/20/2018  . History of CVA (cerebrovascular accident) 01/18/2018  . Stroke ([place]) 01/17/2018  . Hypertension 06/26/2017  . Acute kidney injury superimposed on chronic kidney disease ([place]) 06/25/2017  . 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 Left retinal detachment   . 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 Bilateral cataracts 04/08/2017  . DM (diabetes mellitus), type 2 with renal complications ([place])   . Alcohol abuse   . Poor vision   . Anemia of chronic disease 02/13/2017  . Diffuse brain atrophy ([place]) 02/13/2017  . Epigastric pain 02/11/2017  . Fall at home 11/16/2013  . Closed TBI (traumatic brain injury) ([place]) 11/16/2013  . SDH (subdural hematoma) (HCC) 11/15/2013    Orientation RESPIRATION BLADDER Height & Weight     Self (Dementia)  Normal Incontinent Weight: 38 kg Height:  '5\' 2"'$  (157.5 cm)   BEHAVIORAL SYMPTOMS/MOOD NEUROLOGICAL BOWEL NUTRITION STATUS      Incontinent Diet  AMBULATORY STATUS COMMUNICATION OF NEEDS Skin   Extensive Assist Verbally Normal                       Personal Care Assistance Level of Assistance  Bathing,Feeding,Dressing Bathing Assistance: Maximum assistance Feeding assistance: Maximum assistance Dressing Assistance: Maximum assistance     Functional Limitations Info  Sight,Hearing,Speech Sight Info: Impaired Hearing Info: Impaired Speech Info: Impaired    SPECIAL CARE FACTORS FREQUENCY  PT (By licensed PT),OT (By licensed OT)     PT Frequency: 3X OT Frequency: 3X            Contractures Contractures Info: Not present    Additional Factors Info  Code Status,Allergies,Psychotropic,Insulin Sliding Scale,Isolation Precautions,Suctioning Needs Code Status Info: DNR Allergies Info: No know drug allergies Psychotropic Info: none Insulin Sliding Scale Info: See d/c summary for sliding scale info Isolation Precautions Info: None Suctioning Needs: n/a   Current Medications (06/27/2020):  This is the current hospital active medication list Current Facility-Administered Medications  Medication Dose Route Frequency Provider Last Rate Last Admin  . acetaminophen (TYLENOL) tablet 650 mg  650 mg Oral Q6H PRN Marcelyn Bruins, MD   650 mg at 06/26/20 [id number]   Or  . acetaminophen (TYLENOL) suppository 650 mg  650 mg Rectal Q6H PRN Marcelyn Bruins, MD      . atropine 1 % ophthalmic solution 2 drop  2 drop Sublingual QID PRN Rosezella Rumpf, NP      .  bisacodyl (DULCOLAX) suppository 10 mg  10 mg Rectal Daily PRN Rosezella Rumpf, NP      . dicyclomine (BENTYL) tablet 20 mg  20 mg Oral Daily Marcelyn Bruins, MD   20 mg at 06/27/20 [id number]  . feeding supplement (ENSURE ENLIVE / ENSURE PLUS) liquid   Oral TID Marcelyn Bruins, MD   Given at 06/27/20 [phone]  . fentaNYL (SUBLIMAZE) injection 25-50 mcg  25-50 mcg Intravenous Q1H  PRN Rosezella Rumpf, NP      . hydroxypropyl methylcellulose / hypromellose (ISOPTO TEARS / GONIOVISC) 2.5 % ophthalmic solution 1 drop  1 drop Both Eyes PRN Lane Hacker L, DO      . loperamide (IMODIUM) capsule 2 mg  2 mg Oral PRN Marcelyn Bruins, MD      . LORazepam (ATIVAN) injection 0.5-1 mg  0.5-1 mg Intravenous Q1H PRN Rosezella Rumpf, NP      . ondansetron (ZOFRAN) injection 4 mg  4 mg Intravenous Q6H PRN Rosezella Rumpf, NP      . polyethylene glycol (MIRALAX / GLYCOLAX) packet 17 g  17 g Oral Daily PRN Marcelyn Bruins, MD         Discharge Medications: Please see discharge summary for a list of discharge medications.  Relevant Imaging Results:  Relevant Lab Results:   Additional Information SS# [id number]   needs interpreter [place] Camila Li, RN

## 2020-06-27 NOTE — Progress Notes (Signed)
Palliative Medicine RN Note: Checked in with bedside RN. Pt has same presentation as yesterday.   PMT will continue to shadow and chart check for decline. He has been declined for residential hospice and is pending placement.   Marjie Skiff Dalyn Becker, RN, BSN, [facility] Palliative Medicine Team 06/27/2020 8:16 AM Office [phone]

## 2020-06-27 NOTE — Progress Notes (Addendum)
PROGRESS NOTE    Timothy [name]  [id number] DOB: Aug 08, 1940 DOA: 06/16/2020 PCP: [name], PA-C   Brief Narrative:  The patient is a 80 year old male past medical history significant for but not limited to dementia, stage IIIb chronic kidney disease, history of TBI, history of CVA, diabetes mellitus type 2, history of hypertension, hyperlipidemia, as well as being legally blind who was brought from his assisted living facility due to somnolence.  He speaks very little [place] but does speak [place].  He was found to be hypernatremic in the ED along with having an AKI on CKD stage IIIb as his BUN and creatinine was 136/7.8.  He was admitted to the hospital with severe dehydration and his recovery has remained very poor.  He has been eating and drinking very little and palliative care was consulted for goals of care discussion and patient's family and friends have agreed for hospice care on 06/23/2020.  A referral for residential hospice was made however since the patient will drink 1 Ensure a day as well as eat a few bites of food they believe that he has left foot infected for greater than 2 weeks and would not be a candidate for residential hospice at this time and recommending better fit for home hospice services.  TOC involved and assistance with placement to determine if the patient was able to return to assisted living facility with hospice services.  Assessment & Plan:   Principal Problem:   Acute metabolic encephalopathy Active Problems:   Anemia of chronic disease   DM (diabetes mellitus), type 2 with renal complications (HCC)   Acute kidney injury superimposed on chronic kidney disease (HCC)   Legally blind   Hypertension   History of CVA (cerebrovascular accident)   Dementia ([place])   Dyslipidemia   Uremia   Hypernatremia  Severe dehydration with Associated Hypernatremia AKI on CKD stage IIIb Metabolic Acidosis Hyperphosphatemia  Significant free water deficit.    Acute metabolic encephalopathy with history of dementia and traumatic brain injury Extreme Debilitation and Generalized Weakness Severe protein calorie malnutrition Acute on Chronic Anemia of chronic kidney disease Type 2 Diabetes Mellitus Leukocytosis  Hypoalbuminemia End-of-life care  Plan: -Patient with extreme debility, hypernatremia, progressive renal failure nearing end-of-life. -He is now Comfort care and recommendation is for hospice. -Referred to Residential hospice, however patient is deemed not needing acute inpatient hospice. -All symptom control medications available with Acetaminophen, Atropine 2 drop SL, Bisacodyl 10 mg RC, Fentanyl 25-50 mcg, Loperamide, Lorazepam, Ondansetron and Miralax.  Do not escalate care.  No lab draws. -RN to pronounce death if anticipated death happens while hospitalized. -His assisted living facility should be able to offer hospice facility but [facility] checking  -He can be transferred back to ALF with hospice in place or may need to find a SNF with hospice services. TOC to assist with placement   DVT prophylaxis: None  Now that he is comfort Care Code Status: DO NOT RESUSCITATE  Family Communication: No family present at bedside  Disposition Plan: Anticipating discharging back to ALF with Hospice if available given that he is medically stable to D/C   Status is: Inpatient  Remains inpatient appropriate because:Unsafe d/c plan, IV treatments appropriate due to intensity of illness or inability to take PO and Inpatient level of care appropriate due to severity of illness   Dispo: The patient is from: ALF              Anticipated d/c is to: ALF  Anticipated d/c date is: 1 day              Patient currently is medically stable to d/c.   Difficult to place patient No  Consultants:   Palliative Care Medicine   Procedures: None  Antimicrobials:  Anti-infectives (From admission, onward)   None        Subjective: Seen and  examined and he was withdrawn and the covers over his head.  Denies any complaints.  No family at bedside.  No other concerns or complaints at this time noted.  Objective: Vitals:   06/25/20 1957 06/26/20 1332 06/26/20 1935 06/27/20 0751  BP: (!) 97/24 (!) 153/60 (!) 124/27 (!) 178/58  Pulse: 67 72 68 67  Resp: '18  18 19  '$ Temp: 99.2 F (37.3 C) 97.6 F (36.4 C) 97.8 F (36.6 C) 97.8 F (36.6 C)  TempSrc: Axillary  Axillary Axillary  SpO2: 96% 100% 98% 98%  Weight:      Height:        Intake/Output Summary (Last 24 hours) at 06/27/2020 1150 Last data filed at 06/27/2020 [id number] Gross per 24 hour  Intake --  Output 1000 ml  Net -1000 ml   Filed Weights   06/20/20 0500 06/21/20 0451 06/22/20 0500  Weight: 38.7 kg 38.4 kg 38 kg   Examination: Physical Exam:  Constitutional: Thin male in NAD and appears calm and comfortable Eyes: Lids and conjunctivae normal, sclerae anicteric  ENMT: External Ears, Nose appear normal. Grossly normal hearing. Neck: Appears normal, supple, no cervical masses, normal ROM, no appreciable thyromegaly; no JVD Respiratory: Diminished to auscultation bilaterally, no wheezing, rales, rhonchi or crackles. Normal respiratory effort and patient is not tachypenic. No accessory muscle use.  Unlabored breathing Cardiovascular: RRR, no murmurs / rubs / gallops. S1 and S2 auscultated. No carotid bruits.  Abdomen: Soft, non-tender, non-distended. No masses palpated. No appreciable hepatosplenomegaly. Bowel sounds positive.  GU: Deferred. Musculoskeletal: No clubbing / cyanosis of digits/nails. No joint deformity upper and lower extremities.  Skin: No rashes, lesions, ulcers on limited skin evaluation. No induration; Warm and dry.  Neurologic: CN 2-12 grossly intact with no focal deficits. Romberg sign and cerebellar reflexes not assessed.  Psychiatric: Impaired judgment and insight.  He is awake but not fully alert and oriented x 3. Normal mood and appropriate affect.    Data Reviewed: I have personally reviewed following labs and imaging studies  CBC: Recent Labs  Lab 06/21/20 0706 06/22/20 0334  WBC 12.1* 18.1*  NEUTROABS  --  14.8*  HGB 7.4* 6.4*  HCT 23.0* 19.6*  MCV 81.0 80.0  PLT 225 [id number]   Basic Metabolic Panel: Recent Labs  Lab 06/20/20 1202 06/21/20 0706 06/22/20 0334 06/23/20 0156  NA 138 133* 135 137  K  --  4.3 4.1 4.2  CL  --  105 104 107  CO2  --  21* 21* 19*  GLUCOSE  --  246* 171* 144*  BUN  --  66* 70* 71*  CREATININE  --  4.28* 4.55* 5.07*  CALCIUM  --  7.1* 7.4* 7.6*  MG  --   --  2.1  --   PHOS  --  3.5 4.1  --    GFR: Estimated Creatinine Clearance: 6.1 mL/min (A) (by C-G formula based on SCr of 5.07 mg/dL (H)). Liver Function Tests: Recent Labs  Lab 06/21/20 0706  ALBUMIN 2.0*   No results for input(s): LIPASE, AMYLASE in the last 168 hours. No results for input(s): AMMONIA in the  last 168 hours. Coagulation Profile: No results for input(s): INR, PROTIME in the last 168 hours. Cardiac Enzymes: No results for input(s): CKTOTAL, CKMB, CKMBINDEX, TROPONINI in the last 168 hours. BNP (last 3 results) No results for input(s): PROBNP in the last 8760 hours. HbA1C: No results for input(s): HGBA1C in the last 72 hours. CBG: Recent Labs  Lab 06/22/20 0753 06/22/20 1229 06/22/20 1637 06/22/20 2204 06/23/20 0750  GLUCAP 140* 118* 154* 139* 122*   Lipid Profile: No results for input(s): CHOL, HDL, LDLCALC, TRIG, CHOLHDL, LDLDIRECT in the last 72 hours. Thyroid Function Tests: No results for input(s): TSH, T4TOTAL, FREET4, T3FREE, THYROIDAB in the last 72 hours. Anemia Panel: No results for input(s): VITAMINB12, FOLATE, FERRITIN, TIBC, IRON, RETICCTPCT in the last 72 hours. Sepsis Labs: No results for input(s): PROCALCITON, LATICACIDVEN in the last 168 hours.  No results found for this or any previous visit (from the past 240 hour(s)).   RN Pressure Injury Documentation:     Estimated body mass  index is 15.32 kg/m as calculated from the following:   Height as of this encounter: '5\' 2"'$  (1.575 m).   Weight as of this encounter: 38 kg.  Malnutrition Type:   Malnutrition Characteristics:   Nutrition Interventions:     Radiology Studies: No results found.  Scheduled Meds: . dicyclomine  20 mg Oral Daily  . feeding supplement   Oral TID   Continuous Infusions:   LOS: 10 days   Kerney Elbe, DO [facility] PAGER is on AMION  If 7PM-7AM, please contact night-coverage [email or web address]

## 2020-06-28 DIAGNOSIS — Z8673 Personal history of transient ischemic attack (TIA), and cerebral infarction without residual deficits: Secondary | ICD-10-CM

## 2020-06-28 MED ORDER — BISACODYL 10 MG RE SUPP: 10.0000 mg | Freq: Every day | RECTAL | 0 refills | Status: AC | PRN

## 2020-06-28 MED ORDER — POLYETHYLENE GLYCOL 3350 17 G PO PACK: 17.0000 g | PACK | Freq: Every day | ORAL | 0 refills | Status: AC | PRN

## 2020-06-28 MED ORDER — ACETAMINOPHEN 325 MG PO TABS: 650.0000 mg | ORAL_TABLET | Freq: Four times a day (QID) | ORAL | 0 refills | Status: AC | PRN

## 2020-06-28 MED ORDER — HYPROMELLOSE (GONIOSCOPIC) 2.5 % OP SOLN: 1.0000 [drp] | OPHTHALMIC | 12 refills | Status: AC | PRN

## 2020-06-28 MED ORDER — ATROPINE SULFATE 1 % OP SOLN: 2.0000 [drp] | Freq: Four times a day (QID) | OPHTHALMIC | 12 refills | Status: AC | PRN

## 2020-06-28 NOTE — TOC Progression Note (Signed)
Transition of Care [facility]) - Progression Note    Patient Details  Name: Timothy [name] MRN: [id number] Date of Birth: 22-Jun-1940  Transition of Care [facility]) CM/SW Contact  Pollie Friar, RN Phone Number: 06/28/2020, 3:44 PM  Clinical Narrative:    CM faxed d/c information and needed forms to [facility] this am. CM followed up the fax with a call to [facility] at 12:00 pm. She asked that her staff review the information and they would call back.  1330: CM attempted to reach [facility] 1343: CM called the facility and uable to speak to anyone 1400: CM called facility and left voicemail for directory 1437: CM called facility and left message with staff that pt d/ced.  1439: received call from [facility] that they needed 30 minutes 1453: facility called and said they can accept back but need DME through hospice  CM has updated Chrisyln with [facility] that DME needed. She is going to see about having it delivered tonight so pt can d/c in am. CM has updated pts 1st contact and left message for pts niece. MD and bedside RN also updated that d/c wont occur until am.   Expected Discharge Plan: [place] Barriers to Discharge: Continued Medical Work up,Insurance Authorization  Expected Discharge Plan and Services Expected Discharge Plan: [place]       Living arrangements for the past 2 months: [place] ([place]) Expected Discharge Date: 06/28/20                                     Social Determinants of Health (SDOH) Interventions    Readmission Risk Interventions No flowsheet data found.

## 2020-06-28 NOTE — Plan of Care (Signed)
Problem: Clinical Measurements: Goal: Will remain free from infection 06/28/2020 0315 by Burnice Logan, LPN Outcome: Progressing 06/28/2020 0252 by Burnice Logan, LPN Outcome: Progressing Goal: Diagnostic test results will improve 06/28/2020 0315 by Burnice Logan, LPN Outcome: Progressing 06/28/2020 0252 by Burnice Logan, LPN Outcome: Progressing Goal: Respiratory complications will improve 06/28/2020 0315 by Burnice Logan, LPN Outcome: Progressing 06/28/2020 0252 by Burnice Logan, LPN Outcome: Progressing Goal: Cardiovascular complication will be avoided 06/28/2020 0315 by Burnice Logan, LPN Outcome: Progressing 06/28/2020 0252 by Burnice Logan, LPN Outcome: Progressing   Problem: Activity: Goal: Risk for activity intolerance will decrease 06/28/2020 0315 by Burnice Logan, LPN Outcome: Progressing 06/28/2020 0252 by Burnice Logan, LPN Outcome: Progressing   Problem: Nutrition: Goal: Adequate nutrition will be maintained 06/28/2020 0315 by Burnice Logan, LPN Outcome: Progressing 06/28/2020 0252 by Burnice Logan, LPN Outcome: Progressing   Problem: Elimination: Goal: Will not experience complications related to bowel motility 06/28/2020 0315 by Burnice Logan, LPN Outcome: Progressing 06/28/2020 0252 by Burnice Logan, LPN Outcome: Progressing Goal: Will not experience complications related to urinary retention 06/28/2020 0315 by Burnice Logan, LPN Outcome: Progressing 06/28/2020 0252 by Burnice Logan, LPN Outcome: Progressing   Problem: Pain Managment: Goal: General experience of comfort will improve 06/28/2020 0315 by Burnice Logan, LPN Outcome: Progressing 06/28/2020 0252 by Burnice Logan, LPN Outcome: Progressing   Problem: Safety: Goal: Ability to remain free from injury will improve 06/28/2020 0315 by Burnice Logan, LPN Outcome: Progressing 06/28/2020 0252 by  Burnice Logan, LPN Outcome: Progressing   Problem: Skin Integrity: Goal: Risk for impaired skin integrity will decrease 06/28/2020 0315 by Burnice Logan, LPN Outcome: Progressing 06/28/2020 0252 by Burnice Logan, LPN Outcome: Progressing   Problem: Education: Goal: Ability to describe self-care measures that may prevent or decrease complications (Diabetes Survival Skills Education) will improve 06/28/2020 0315 by Burnice Logan, LPN Outcome: Progressing 06/28/2020 0252 by Burnice Logan, LPN Outcome: Progressing Goal: Individualized Educational Video(s) 06/28/2020 0315 by Burnice Logan, LPN Outcome: Progressing 06/28/2020 0252 by Burnice Logan, LPN Outcome: Progressing   Problem: Coping: Goal: Ability to adjust to condition or change in health will improve 06/28/2020 0315 by Burnice Logan, LPN Outcome: Progressing 06/28/2020 0252 by Burnice Logan, LPN Outcome: Progressing   Problem: Fluid Volume: Goal: Ability to maintain a balanced intake and output will improve 06/28/2020 0315 by Burnice Logan, LPN Outcome: Progressing 06/28/2020 0252 by Burnice Logan, LPN Outcome: Progressing   Problem: Health Behavior/Discharge Planning: Goal: Ability to identify and utilize available resources and services will improve 06/28/2020 0315 by Burnice Logan, LPN Outcome: Progressing 06/28/2020 0252 by Burnice Logan, LPN Outcome: Progressing Goal: Ability to manage health-related needs will improve 06/28/2020 0315 by Burnice Logan, LPN Outcome: Progressing 06/28/2020 0252 by Burnice Logan, LPN Outcome: Progressing   Problem: Metabolic: Goal: Ability to maintain appropriate glucose levels will improve 06/28/2020 0315 by Burnice Logan, LPN Outcome: Progressing 06/28/2020 0252 by Burnice Logan, LPN Outcome: Progressing   Problem: Nutritional: Goal: Maintenance of adequate nutrition will  improve 06/28/2020 0315 by Burnice Logan, LPN Outcome: Progressing 06/28/2020 0252 by Burnice Logan, LPN Outcome: Progressing Goal: Progress toward achieving an optimal weight will improve 06/28/2020 0315 by Burnice Logan, LPN Outcome: Progressing 06/28/2020 0252 by Burnice Logan, LPN Outcome: Progressing   Problem: Skin Integrity: Goal: Risk for impaired skin integrity will decrease 06/28/2020  [id number] by Burnice Logan, LPN Outcome: Progressing 06/28/2020 0252 by Burnice Logan, LPN Outcome: Progressing   Problem: Tissue Perfusion: Goal: Adequacy of tissue perfusion will improve 06/28/2020 0315 by Burnice Logan, LPN Outcome: Progressing 06/28/2020 0252 by Burnice Logan, LPN Outcome: Progressing

## 2020-06-28 NOTE — NC FL2 (Signed)
[place] MEDICAID FL2 LEVEL OF CARE SCREENING TOOL     IDENTIFICATION  Patient Name: Timothy [name] Birthdate: 06/30/1940 Sex: male Admission Date (Current Location): 06/16/2020  [facility] and [place] Number:  Herbalist and Address:  [facility] [place] [address], [place], [place] [address]      Provider Number: [id number]  Attending Physician Name and Address:  Kerney Elbe, DO  Relative Name and Phone Number:  Janie Morning, MSW [phone]    Current Level of Care: Hospital Recommended Level of Care: [place] Prior Approval Number:    Date Approved/Denied:   PASRR Number: [id number]  Discharge Plan: SNF    Current Diagnoses: Patient Active Problem List   Diagnosis Date Noted  . Terminal care 06/27/2020  . End of life care 06/27/2020  . Acute metabolic encephalopathy [id number]  . Uremia 06/16/2020  . Hypernatremia 06/16/2020  . Dyslipidemia 04/12/2018  . Dementia ([place])   . Anemia   . TIA (transient ischemic attack) 02/20/2018  . CKD (chronic kidney disease), stage III ([place]) 01/20/2018  . History of CVA (cerebrovascular accident) 01/18/2018  . Stroke ([place]) 01/17/2018  . Hypertension 06/26/2017  . Acute kidney injury superimposed on chronic kidney disease ([place]) 06/25/2017  . 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 Left retinal detachment   . 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 Bilateral cataracts 04/08/2017  . DM (diabetes mellitus), type 2 with renal complications ([place])   . Alcohol abuse   . Poor vision   . Anemia of chronic disease 02/13/2017  . Diffuse brain atrophy ([place]) 02/13/2017  . Epigastric pain 02/11/2017  . Fall at home 11/16/2013  . Closed TBI (traumatic brain injury) ([place]) 11/16/2013  . SDH (subdural hematoma) (H[facility]) 11/15/2013    Orientation RESPIRATION BLADDER Height & Weight     Self (Dementia)  Normal Incontinent Weight: 38 kg Height:  '5\' 2"'$  (157.5 cm)   BEHAVIORAL SYMPTOMS/MOOD NEUROLOGICAL BOWEL NUTRITION STATUS      Incontinent Diet  AMBULATORY STATUS COMMUNICATION OF NEEDS Skin   Extensive Assist Verbally Normal                       Personal Care Assistance Level of Assistance  Bathing,Feeding,Dressing Bathing Assistance: Maximum assistance Feeding assistance: Maximum assistance Dressing Assistance: Maximum assistance     Functional Limitations Info  Sight,Hearing,Speech Sight Info: Impaired Hearing Info: Impaired Speech Info: Impaired    SPECIAL CARE FACTORS FREQUENCY  PT (By licensed PT),OT (By licensed OT)     PT Frequency: 3X OT Frequency: 3X            Contractures Contractures Info: Not present    Additional Factors Info  Code Status,Allergies,Psychotropic,Insulin Sliding Scale,Isolation Precautions,Suctioning Needs Code Status Info: DNR Allergies Info: No know drug allergies Psychotropic Info: none Insulin Sliding Scale Info: See d/c summary for sliding scale info Isolation Precautions Info: None Suctioning Needs: n/a   Current Medications (06/28/2020):  This is the current hospital active medication list Current Facility-Administered Medications  Medication Dose Route Frequency Provider Last Rate Last Admin  . acetaminophen (TYLENOL) tablet 650 mg  650 mg Oral Q6H PRN Marcelyn Bruins, MD   650 mg at 06/26/20 [id number]   Or  . acetaminophen (TYLENOL) suppository 650 mg  650 mg Rectal Q6H PRN Marcelyn Bruins, MD      . atropine 1 % ophthalmic solution 2 drop  2 drop Sublingual QID PRN Rosezella Rumpf, NP      .  bisacodyl (DULCOLAX) suppository 10 mg  10 mg Rectal Daily PRN Rosezella Rumpf, NP      . dicyclomine (BENTYL) tablet 20 mg  20 mg Oral Daily Marcelyn Bruins, MD   20 mg at 06/28/20 0835  . feeding supplement (ENSURE ENLIVE / ENSURE PLUS) liquid   Oral TID Marcelyn Bruins, MD   237 mL at 06/28/20 0834  . fentaNYL (SUBLIMAZE) injection 25-50 mcg  25-50 mcg Intravenous Q1H  PRN Rosezella Rumpf, NP      . hydroxypropyl methylcellulose / hypromellose (ISOPTO TEARS / GONIOVISC) 2.5 % ophthalmic solution 1 drop  1 drop Both Eyes PRN Lane Hacker L, DO      . loperamide (IMODIUM) capsule 2 mg  2 mg Oral PRN Marcelyn Bruins, MD      . LORazepam (ATIVAN) injection 0.5-1 mg  0.5-1 mg Intravenous Q1H PRN Rosezella Rumpf, NP      . ondansetron (ZOFRAN) injection 4 mg  4 mg Intravenous Q6H PRN Ferolito, Freada Bergeron, NP      . polyethylene glycol (MIRALAX / GLYCOLAX) packet 17 g  17 g Oral Daily PRN Marcelyn Bruins, MD         Discharge Medications: STOP taking these medications   amLODipine 10 MG tablet Commonly known as: NORVASC   aspirin 81 MG chewable tablet   carvedilol 6.25 MG tablet Commonly known as: COREG   ferrous sulfate 325 (65 FE) MG tablet   folic acid 1 MG tablet Commonly known as: FOLVITE   hydrALAZINE 50 MG tablet Commonly known as: APRESOLINE   isosorbide mononitrate 30 MG 24 hr tablet Commonly known as: IMDUR   REFRESH TEARS OP   rosuvastatin 5 MG tablet Commonly known as: CRESTOR   sodium bicarbonate 650 MG tablet   terazosin 5 MG capsule Commonly known as: HYTRIN     TAKE these medications   acetaminophen 325 MG tablet Commonly known as: TYLENOL Take 2 tablets (650 mg total) by mouth every 6 (six) hours as needed for mild pain (or Fever >/= 101).   atropine 1 % ophthalmic solution Place 2 drops under the tongue 4 (four) times daily as needed (Secretions).   bisacodyl 10 MG suppository Commonly known as: DULCOLAX Place 1 suppository (10 mg total) rectally daily as needed (constipation).   dicyclomine 20 MG tablet Commonly known as: BENTYL Take 20 mg by mouth daily.   hydroxypropyl methylcellulose / hypromellose 2.5 % ophthalmic solution Commonly known as: ISOPTO TEARS / GONIOVISC Place 1 drop into both eyes as needed for dry eyes.   loperamide 2 MG capsule Commonly known as:  IMODIUM Take 2-4 mg by mouth See admin instructions. Take 2 capsules (4 mg) by mouth as needed after loose bowel movement, then take 1 capsule (2 mg) after each additional loose bowel movement (max 8 capsules (16 mg) in 24 hours   NUTRITIONAL SUPPLEMENT PO Take 1 each by mouth 3 (three) times daily. Mighty Shake   polyethylene glycol 17 g packet Commonly known as: MIRALAX / GLYCOLAX Take 17 g by mouth daily as needed for mild constipation.        Relevant Imaging Results:  Relevant Lab Results:   Additional Information SS# [id number]   needs interpreter [place] Bradley Ferris, RN

## 2020-06-28 NOTE — Progress Notes (Signed)
Manufacturing engineer [facility])  [place] send signed and completed DNR home with pt/family.  Please provide prescriptions at discharge as needed to ensure ongoing symptom management until patient can be admitted onto hospice services.  A hospital bed and overbed table have been ordered for delivery today.  Please call with any questions or concerns.  Thank you for the opportunity to participate in this pt's care.  Domenic Moras, BSN, RN [employer] [phone] (24h on call)

## 2020-06-28 NOTE — Plan of Care (Signed)

## 2020-06-28 NOTE — Discharge Summary (Signed)
Physician Discharge Summary  Timothy [name] [id number] DOB: 07/17/1940 DOA: 06/16/2020  PCP: [name], PA-C  Admit date: 06/16/2020 Discharge date: 06/28/2020  Admitted From: ALF Disposition: ALF with Hospice Services  Recommendations for Outpatient Follow-up:  1. Further care per Hospice Protocol   Home Health: No Equipment/Devices: None  Discharge Condition: Guarded CODE STATUS: DO NOT RESUSCITATE  Diet recommendation: Dysphagia 2 Diet   Brief/Interim Summary: The patient is a 80 year old male past medical history significant for but not limited to dementia, stage IIIb chronic kidney disease, history of TBI, history of CVA, diabetes mellitus type 2, history of hypertension, hyperlipidemia, as well as being legally blind who was brought from his assisted living facility due to somnolence.  He speaks very little [place] but does speak [place].  He was found to be hypernatremic in the ED along with having an AKI on CKD stage IIIb as his BUN and creatinine was 136/7.8.  He was admitted to the hospital with severe dehydration and his recovery has remained very poor.  He has been eating and drinking very little and palliative care was consulted for goals of care discussion and patient's family and friends have agreed for hospice care on 06/23/2020.  A referral for residential hospice was made however since the patient will drink 1 Ensure a day as well as eat a few bites of food they believe that he has left foot infected for greater than 2 weeks and would not be a candidate for residential hospice at this time and recommending better fit for home hospice services.  TOC involved and assistance with placement to determine if the patient was able to return to assisted living facility with hospice services and he is able to so will be D/C'd back to Facility today with Hospice Services.  Discharge Diagnoses:  Principal Problem:   End of life care Active Problems:   Anemia of chronic  disease   DM (diabetes mellitus), type 2 with renal complications ([place])   Acute kidney injury superimposed on chronic kidney disease ([facility])   Legally blind   Hypertension   History of CVA (cerebrovascular accident)   Dementia ([place])   Dyslipidemia   Acute metabolic encephalopathy   Uremia   Hypernatremia   Terminal care  Severe dehydration with Associated Hypernatremia AKI on CKD stage IIIb Metabolic Acidosis Hyperphosphatemia  Significant free water deficit.  Acute metabolic encephalopathy with history of dementia and traumatic brain injury Extreme Debilitation and Generalized Weakness Severe protein calorie malnutrition Acute on Chronic Anemia of chronic kidney disease Type 2 Diabetes Mellitus Leukocytosis  Hypoalbuminemia End-of-life care  Plan: -Patient with extreme debility, hypernatremia, progressive renal failure nearing end-of-life. -He is now Comfort care and recommendation is for hospice. -Referred to Residential hospice, however patient is deemed not needing acute inpatient hospice. -All symptom control medications available with Acetaminophen, Atropine 2 drop SL, Bisacodyl 10 mg RC, Fentanyl 25-50 mcg, Loperamide, Lorazepam, Ondansetron and Miralax. Do not escalate care. No lab draws. -RN to pronounce death if anticipated death happens while hospitalized. -His assisted living facility should be able to offer hospice facility but [facility] checking  -He can be transferred back to ALF with hospice in placeor may need to find a SNF with hospice services. TOC to assist with placement and ok to return to ALF with Hospice Services  Discharge Instructions  Discharge Instructions    Call MD for:  difficulty breathing, headache or visual disturbances   Complete by: As directed    Call MD for:  extreme  fatigue   Complete by: As directed    Call MD for:  hives   Complete by: As directed    Call MD for:  persistant dizziness or light-headedness   Complete by: As directed     Call MD for:  persistant nausea and vomiting   Complete by: As directed    Call MD for:  redness, tenderness, or signs of infection (pain, swelling, redness, odor or green/yellow discharge around incision site)   Complete by: As directed    Call MD for:  severe uncontrolled pain   Complete by: As directed    Call MD for:  temperature >100.4   Complete by: As directed    Diet - low sodium heart healthy   Complete by: As directed    Dysphagia 2 Diet   Increase activity slowly   Complete by: As directed      Allergies as of 06/28/2020   No Known Allergies     Medication List    STOP taking these medications   amLODipine 10 MG tablet Commonly known as: NORVASC   aspirin 81 MG chewable tablet   carvedilol 6.25 MG tablet Commonly known as: COREG   ferrous sulfate 325 (65 FE) MG tablet   folic acid 1 MG tablet Commonly known as: FOLVITE   hydrALAZINE 50 MG tablet Commonly known as: APRESOLINE   isosorbide mononitrate 30 MG 24 hr tablet Commonly known as: IMDUR   REFRESH TEARS OP   rosuvastatin 5 MG tablet Commonly known as: CRESTOR   sodium bicarbonate 650 MG tablet   terazosin 5 MG capsule Commonly known as: HYTRIN     TAKE these medications   acetaminophen 325 MG tablet Commonly known as: TYLENOL Take 2 tablets (650 mg total) by mouth every 6 (six) hours as needed for mild pain (or Fever >/= 101).   atropine 1 % ophthalmic solution Place 2 drops under the tongue 4 (four) times daily as needed (Secretions).   bisacodyl 10 MG suppository Commonly known as: DULCOLAX Place 1 suppository (10 mg total) rectally daily as needed (constipation).   dicyclomine 20 MG tablet Commonly known as: BENTYL Take 20 mg by mouth daily.   hydroxypropyl methylcellulose / hypromellose 2.5 % ophthalmic solution Commonly known as: ISOPTO TEARS / GONIOVISC Place 1 drop into both eyes as needed for dry eyes.   loperamide 2 MG capsule Commonly known as: IMODIUM Take 2-4 mg  by mouth See admin instructions. Take 2 capsules (4 mg) by mouth as needed after loose bowel movement, then take 1 capsule (2 mg) after each additional loose bowel movement (max 8 capsules (16 mg) in 24 hours   NUTRITIONAL SUPPLEMENT PO Take 1 each by mouth 3 (three) times daily. Mighty Shake   polyethylene glycol 17 g packet Commonly known as: MIRALAX / GLYCOLAX Take 17 g by mouth daily as needed for mild constipation.       No Known Allergies  Consultations:  Palliative Care Medicine   Procedures/Studies: US RENAL  Result Date: 06/16/2020 CLINICAL DATA:  Acute renal failure EXAM: RENAL / URINARY TRACT ULTRASOUND COMPLETE COMPARISON:  None. FINDINGS: Right Kidney: Renal measurements: 10.6 x 4.7 x 4.2 cm. = volume: 110 mL. Diffuse increased echogenicity is noted. No mass or hydronephrosis is noted. Left Kidney: Renal measurements: 8.6 x 5.0 x 4.0 cm = volume: 90 mL. Increased echogenicity is noted. No mass lesion or hydronephrosis is noted. Bladder: Appears normal for degree of bladder distention. Other: None. IMPRESSION: Changes consistent with medical renal disease.  Electronically Signed   By: Inez Catalina M.D.   On: 06/16/2020 21:50   DG Chest Port 1 View  Result Date: 06/16/2020 CLINICAL DATA:  Altered mental status. EXAM: PORTABLE CHEST 1 VIEW COMPARISON:  Radiograph 02/20/2018, CT 07/09/2017 FINDINGS: Chronic ill-defined opacity at the left lung base, but progressed from prior exam. Bronchiectasis noted on prior CT. No other focal airspace disease. The heart is normal in size normal mediastinal contours. No significant pleural effusion. No pulmonary edema. No pneumothorax. No acute osseous abnormalities are seen. IMPRESSION: Chronic but progressed left basilar opacity, corresponding to bronchiectasis and mucous plugging on prior CT. Electronically Signed   By: Keith Rake M.D.   On: 06/16/2020 16:47    Subjective: Seen and examined at bedside and patient was withdrawn and  resting but appeared comfortable.  No nausea or vomiting.  Denies any lightheadedness or dizziness.  No other concerns at this time.  Discharge Exam: Vitals:   06/27/20 0751 06/27/20 1943  BP: (!) 178/58 (!) 137/28  Pulse: 67 74  Resp: 19 18  Temp: 97.8 F (36.6 C) 97.7 F (36.5 C)  SpO2: 98% 96%   Vitals:   06/26/20 1332 06/26/20 1935 06/27/20 0751 06/27/20 1943  BP: (!) 153/60 (!) 124/27 (!) 178/58 (!) 137/28  Pulse: 72 68 67 74  Resp:  '18 19 18  '$ Temp: 97.6 F (36.4 C) 97.8 F (36.6 C) 97.8 F (36.6 C) 97.7 F (36.5 C)  TempSrc:  Axillary Axillary Axillary  SpO2: 100% 98% 98% 96%  Weight:      Height:       General: Pt is withdrawn but he is awake, not in acute distress Cardiovascular: RRR, S1/S2 +, no rubs, no gallops Respiratory: Diminished bilaterally, no wheezing, no rhonchi; unlabored breathing Abdominal: Soft, NT, ND, bowel sounds + Extremities: no edema, no cyanosis  The results of significant diagnostics from this hospitalization (including imaging, microbiology, ancillary and laboratory) are listed below for reference.    Microbiology: No results found for this or any previous visit (from the past 240 hour(s)).   Labs: BNP (last 3 results) No results for input(s): BNP in the last 8760 hours. Basic Metabolic Panel: Recent Labs  Lab 06/22/20 0334 06/23/20 0156  NA 135 137  K 4.1 4.2  CL 104 107  CO2 21* 19*  GLUCOSE 171* 144*  BUN 70* 71*  CREATININE 4.55* 5.07*  CALCIUM 7.4* 7.6*  MG 2.1  --   PHOS 4.1  --    Liver Function Tests: No results for input(s): AST, ALT, ALKPHOS, BILITOT, PROT, ALBUMIN in the last 168 hours. No results for input(s): LIPASE, AMYLASE in the last 168 hours. No results for input(s): AMMONIA in the last 168 hours. CBC: Recent Labs  Lab 06/22/20 0334  WBC 18.1*  NEUTROABS 14.8*  HGB 6.4*  HCT 19.6*  MCV 80.0  PLT 224   Cardiac Enzymes: No results for input(s): CKTOTAL, CKMB, CKMBINDEX, TROPONINI in the last 168  hours. BNP: Invalid input(s): POCBNP CBG: Recent Labs  Lab 06/22/20 0753 06/22/20 1229 06/22/20 1637 06/22/20 2204 06/23/20 0750  GLUCAP 140* 118* 154* 139* 122*   D-Dimer No results for input(s): DDIMER in the last 72 hours. Hgb A1c No results for input(s): HGBA1C in the last 72 hours. Lipid Profile No results for input(s): CHOL, HDL, LDLCALC, TRIG, CHOLHDL, LDLDIRECT in the last 72 hours. Thyroid function studies No results for input(s): TSH, T4TOTAL, T3FREE, THYROIDAB in the last 72 hours.  Invalid input(s): FREET3 Anemia work up  No results for input(s): VITAMINB12, FOLATE, FERRITIN, TIBC, IRON, RETICCTPCT in the last 72 hours. Urinalysis    Component Value Date/Time   COLORURINE YELLOW 06/16/2020 1743   APPEARANCEUR HAZY (A) 06/16/2020 1743   LABSPEC 1.013 06/16/2020 1743   PHURINE 6.0 06/16/2020 1743   GLUCOSEU 150 (A) 06/16/2020 1743   HGBUR NEGATIVE 06/16/2020 1743   BILIRUBINUR NEGATIVE 06/16/2020 1743   KETONESUR NEGATIVE 06/16/2020 1743   PROTEINUR >=300 (A) 06/16/2020 1743   UROBILINOGEN 0.2 11/15/2013 0231   NITRITE NEGATIVE 06/16/2020 1743   LEUKOCYTESUR NEGATIVE 06/16/2020 1743   Sepsis Labs Invalid input(s): PROCALCITONIN,  WBC,  LACTICIDVEN Microbiology No results found for this or any previous visit (from the past 240 hour(s)).  Time coordinating discharge: 25 minutes  SIGNED:  Kerney Elbe, DO [facility] 06/28/2020, 11:20 AM Pager is on [place]  If 7PM-7AM, please contact night-coverage [email or web address]

## 2020-06-28 NOTE — Plan of Care (Signed)

## 2020-06-29 NOTE — Discharge Summary (Signed)
Physician Discharge Summary  Timothy [name] [id number] DOB: 1940/07/24 DOA: 06/16/2020  PCP: [name], PA-C  Admit date: 06/16/2020 Discharge date: 06/29/2020  Admitted From: ALF Disposition: ALF with Hospice Services  Recommendations for Outpatient Follow-up:  1. Further care per Hospice Protocol   Home Health: No Equipment/Devices: None  Discharge Condition: Guarded CODE STATUS: DO NOT RESUSCITATE  Diet recommendation: Dysphagia 2 Diet   Brief/Interim Summary: The patient is a 80 year old male past medical history significant for but not limited to dementia, stage IIIb chronic kidney disease, history of TBI, history of CVA, diabetes mellitus type 2, history of hypertension, hyperlipidemia, as well as being legally blind who was brought from his assisted living facility due to somnolence.  He speaks very little [place] but does speak [place].  He was found to be hypernatremic in the ED along with having an AKI on CKD stage IIIb as his BUN and creatinine was 136/7.8.  He was admitted to the hospital with severe dehydration and his recovery has remained very poor.  He has been eating and drinking very little and palliative care was consulted for goals of care discussion and patient's family and friends have agreed for hospice care on 06/23/2020.  A referral for residential hospice was made however since the patient will drink 1 Ensure a day as well as eat a few bites of food they believe that he has left foot infected for greater than 2 weeks and would not be a candidate for residential hospice at this time and recommending better fit for home hospice services.  TOC involved and assistance with placement to determine if the patient was able to return to assisted living facility with hospice services and he is able to so will be D/C'd back to Facility today with Hospice Services.  ADDENDUM 06/29/20: Unfortunately patient was unable to leave yesterday as the facility stated that they  cannot accept the patient back but they needed DME.  His DME was to be delivered last night and he is supposed to be discharged this morning as DME had not arrived yesterday afternoon.  He remains stable to be discharged ALF with hospice services.  Discharge Diagnoses:  Principal Problem:   End of life care Active Problems:   Anemia of chronic disease   DM (diabetes mellitus), type 2 with renal complications ([place])   Acute kidney injury superimposed on chronic kidney disease (H[facility])   Legally blind   Hypertension   History of CVA (cerebrovascular accident)   Dementia ([place])   Dyslipidemia   Acute metabolic encephalopathy   Uremia   Hypernatremia   Terminal care  Severe dehydration with Associated Hypernatremia AKI on CKD stage IIIb Metabolic Acidosis Hyperphosphatemia  Significant free water deficit.  Acute metabolic encephalopathy with history of dementia and traumatic brain injury Extreme Debilitation and Generalized Weakness Severe protein calorie malnutrition Acute on Chronic Anemia of chronic kidney disease Type 2 Diabetes Mellitus Leukocytosis  Hypoalbuminemia End-of-life care  Plan: -Patient with extreme debility, hypernatremia, progressive renal failure nearing end-of-life. -He is now Comfort care and recommendation is for hospice. -Referred to Residential hospice, however patient is deemed not needing acute inpatient hospice. -All symptom control medications available with Acetaminophen, Atropine 2 drop SL, Bisacodyl 10 mg RC, Fentanyl 25-50 mcg, Loperamide, Lorazepam, Ondansetron and Miralax. Do not escalate care. No lab draws. -RN to pronounce death if anticipated death happens while hospitalized. -His assisted living facility should be able to offer hospice facility but [facility] checking  -He can be transferred back to  ALF with hospice in placeor may need to find a SNF with hospice services. TOC to assist with placement and ok to return to ALF with Hospice  Services  Discharge Instructions  Discharge Instructions    Call MD for:  difficulty breathing, headache or visual disturbances   Complete by: As directed    Call MD for:  extreme fatigue   Complete by: As directed    Call MD for:  hives   Complete by: As directed    Call MD for:  persistant dizziness or light-headedness   Complete by: As directed    Call MD for:  persistant nausea and vomiting   Complete by: As directed    Call MD for:  redness, tenderness, or signs of infection (pain, swelling, redness, odor or green/yellow discharge around incision site)   Complete by: As directed    Call MD for:  severe uncontrolled pain   Complete by: As directed    Call MD for:  temperature >100.4   Complete by: As directed    Diet - low sodium heart healthy   Complete by: As directed    Dysphagia 2 Diet   Increase activity slowly   Complete by: As directed      Allergies as of 06/29/2020   No Known Allergies     Medication List    STOP taking these medications   amLODipine 10 MG tablet Commonly known as: NORVASC   aspirin 81 MG chewable tablet   carvedilol 6.25 MG tablet Commonly known as: COREG   ferrous sulfate 325 (65 FE) MG tablet   folic acid 1 MG tablet Commonly known as: FOLVITE   hydrALAZINE 50 MG tablet Commonly known as: APRESOLINE   isosorbide mononitrate 30 MG 24 hr tablet Commonly known as: IMDUR   REFRESH TEARS OP   rosuvastatin 5 MG tablet Commonly known as: CRESTOR   sodium bicarbonate 650 MG tablet   terazosin 5 MG capsule Commonly known as: HYTRIN     TAKE these medications   acetaminophen 325 MG tablet Commonly known as: TYLENOL Take 2 tablets (650 mg total) by mouth every 6 (six) hours as needed for mild pain (or Fever >/= 101).   atropine 1 % ophthalmic solution Place 2 drops under the tongue 4 (four) times daily as needed (Secretions).   bisacodyl 10 MG suppository Commonly known as: DULCOLAX Place 1 suppository (10 mg total)  rectally daily as needed (constipation).   dicyclomine 20 MG tablet Commonly known as: BENTYL Take 20 mg by mouth daily.   hydroxypropyl methylcellulose / hypromellose 2.5 % ophthalmic solution Commonly known as: ISOPTO TEARS / GONIOVISC Place 1 drop into both eyes as needed for dry eyes.   loperamide 2 MG capsule Commonly known as: IMODIUM Take 2-4 mg by mouth See admin instructions. Take 2 capsules (4 mg) by mouth as needed after loose bowel movement, then take 1 capsule (2 mg) after each additional loose bowel movement (max 8 capsules (16 mg) in 24 hours   NUTRITIONAL SUPPLEMENT PO Take 1 each by mouth 3 (three) times daily. Mighty Shake   polyethylene glycol 17 g packet Commonly known as: MIRALAX / GLYCOLAX Take 17 g by mouth daily as needed for mild constipation.       No Known Allergies  Consultations:  Palliative Care Medicine   Procedures/Studies: US RENAL  Result Date: 06/16/2020 CLINICAL DATA:  Acute renal failure EXAM: RENAL / URINARY TRACT ULTRASOUND COMPLETE COMPARISON:  None. FINDINGS: Right Kidney: Renal measurements: 10.6 x  4.7 x 4.2 cm. = volume: 110 mL. Diffuse increased echogenicity is noted. No mass or hydronephrosis is noted. Left Kidney: Renal measurements: 8.6 x 5.0 x 4.0 cm = volume: 90 mL. Increased echogenicity is noted. No mass lesion or hydronephrosis is noted. Bladder: Appears normal for degree of bladder distention. Other: None. IMPRESSION: Changes consistent with medical renal disease. Electronically Signed   By: Inez Catalina M.D.   On: 06/16/2020 21:50   DG Chest Port 1 View  Result Date: 06/16/2020 CLINICAL DATA:  Altered mental status. EXAM: PORTABLE CHEST 1 VIEW COMPARISON:  Radiograph 02/20/2018, CT 07/09/2017 FINDINGS: Chronic ill-defined opacity at the left lung base, but progressed from prior exam. Bronchiectasis noted on prior CT. No other focal airspace disease. The heart is normal in size normal mediastinal contours. No significant  pleural effusion. No pulmonary edema. No pneumothorax. No acute osseous abnormalities are seen. IMPRESSION: Chronic but progressed left basilar opacity, corresponding to bronchiectasis and mucous plugging on prior CT. Electronically Signed   By: Keith Rake M.D.   On: 06/16/2020 16:47    Subjective: Seen and examined at bedside and patient was withdrawn and resting but appeared comfortable.  No nausea or vomiting.  Denies any lightheadedness or dizziness.  No other concerns at this time.  Discharge Exam: Vitals:   06/27/20 1943 06/28/20 1331  BP: (!) 137/28 (!) 181/158  Pulse: 74 74  Resp: 18 17  Temp: 97.7 F (36.5 C) 98 F (36.7 C)  SpO2: 96% 100%   Vitals:   06/26/20 1935 06/27/20 0751 06/27/20 1943 06/28/20 1331  BP: (!) 124/27 (!) 178/58 (!) 137/28 (!) 181/158  Pulse: 68 67 74 74  Resp: '18 19 18 17  '$ Temp: 97.8 F (36.6 C) 97.8 F (36.6 C) 97.7 F (36.5 C) 98 F (36.7 C)  TempSrc: Axillary Axillary Axillary   SpO2: 98% 98% 96% 100%  Weight:      Height:       General: Pt is withdrawn but he is awake, not in acute distress Cardiovascular: RRR, S1/S2 +, no rubs, no gallops Respiratory: Diminished bilaterally, no wheezing, no rhonchi; unlabored breathing Abdominal: Soft, NT, ND, bowel sounds + Extremities: no edema, no cyanosis  The results of significant diagnostics from this hospitalization (including imaging, microbiology, ancillary and laboratory) are listed below for reference.    Microbiology: No results found for this or any previous visit (from the past 240 hour(s)).   Labs: BNP (last 3 results) No results for input(s): BNP in the last 8760 hours. Basic Metabolic Panel: Recent Labs  Lab 06/23/20 0156  NA 137  K 4.2  CL 107  CO2 19*  GLUCOSE 144*  BUN 71*  CREATININE 5.07*  CALCIUM 7.6*   Liver Function Tests: No results for input(s): AST, ALT, ALKPHOS, BILITOT, PROT, ALBUMIN in the last 168 hours. No results for input(s): LIPASE, AMYLASE in  the last 168 hours. No results for input(s): AMMONIA in the last 168 hours. CBC: No results for input(s): WBC, NEUTROABS, HGB, HCT, MCV, PLT in the last 168 hours. Cardiac Enzymes: No results for input(s): CKTOTAL, CKMB, CKMBINDEX, TROPONINI in the last 168 hours. BNP: Invalid input(s): POCBNP CBG: Recent Labs  Lab 06/22/20 1229 06/22/20 1637 06/22/20 2204 06/23/20 0750  GLUCAP 118* 154* 139* 122*   D-Dimer No results for input(s): DDIMER in the last 72 hours. Hgb A1c No results for input(s): HGBA1C in the last 72 hours. Lipid Profile No results for input(s): CHOL, HDL, LDLCALC, TRIG, CHOLHDL, LDLDIRECT in the last  72 hours. Thyroid function studies No results for input(s): TSH, T4TOTAL, T3FREE, THYROIDAB in the last 72 hours.  Invalid input(s): FREET3 Anemia work up No results for input(s): VITAMINB12, FOLATE, FERRITIN, TIBC, IRON, RETICCTPCT in the last 72 hours. Urinalysis    Component Value Date/Time   COLORURINE YELLOW 06/16/2020 1743   APPEARANCEUR HAZY (A) 06/16/2020 1743   LABSPEC 1.013 06/16/2020 1743   PHURINE 6.0 06/16/2020 1743   GLUCOSEU 150 (A) 06/16/2020 1743   HGBUR NEGATIVE 06/16/2020 1743   BILIRUBINUR NEGATIVE 06/16/2020 1743   KETONESUR NEGATIVE 06/16/2020 1743   PROTEINUR >=300 (A) 06/16/2020 1743   UROBILINOGEN 0.2 11/15/2013 0231   NITRITE NEGATIVE 06/16/2020 1743   LEUKOCYTESUR NEGATIVE 06/16/2020 1743   Sepsis Labs Invalid input(s): PROCALCITONIN,  WBC,  LACTICIDVEN Microbiology No results found for this or any previous visit (from the past 240 hour(s)).  Time coordinating discharge: 25 minutes  SIGNED:  Kerney Elbe, DO [facility] 06/29/2020, 11:02 AM Pager is on [place]  If 7PM-7AM, please contact night-coverage [email or web address]

## 2020-06-29 NOTE — Care Management Important Message (Signed)
Important Message  Patient Details  Name: Timothy [name] MRN: [id number] Date of Birth: [birth year]   Medicare Important Message Given:  Yes     [name] Montine Circle 06/29/2020, 3:46 PM

## 2020-06-29 NOTE — Progress Notes (Signed)
Called report to [place] at [facility].

## 2020-06-29 NOTE — TOC Transition Note (Signed)
Transition of Care [facility]) - CM/SW Discharge Note   Patient Details  Name: Timothy [name] MRN: [id number] Date of Birth: 11/13/1940  Transition of Care [facility] CM/SW Contact:  Pollie Friar, RN Phone Number: 06/29/2020, 11:56 AM   Clinical Narrative:    Patient discharging back to [facility] today. CM spoke with facility and hospital bed has been delivered. CM will update hospice of d/c. Pt transporting via [facility]. Bedside RN updated.  Number for report: [phone]   Final next level of care: Assisted Living Barriers to Discharge: No Barriers Identified   Patient Goals and CMS Choice   CMS Medicare.gov Compare Post Acute Care list provided to:: Patient Represenative (must comment) Choice offered to / list presented to :  (niece)  Discharge Placement              Patient chooses bed at: Other - please specify in the comment section below: Patient to be transferred to facility by: Cruzita Lederer Name of family member notified: [name] Patient and family notified of of transfer: 06/29/20  Discharge Plan and Services                                     Social Determinants of Health (SDOH) Interventions     Readmission Risk Interventions No flowsheet data found.

## 2020-06-29 NOTE — Plan of Care (Signed)
  Problem: Health Behavior/Discharge Planning: Goal: Ability to manage health-related needs will improve Outcome: Adequate for Discharge   Problem: Clinical Measurements: Goal: Ability to maintain clinical measurements within normal limits will improve Outcome: Adequate for Discharge Goal: Will remain free from infection Outcome: Adequate for Discharge Goal: Diagnostic test results will improve Outcome: Adequate for Discharge Goal: Respiratory complications will improve Outcome: Adequate for Discharge Goal: Cardiovascular complication will be avoided Outcome: Adequate for Discharge   Problem: Activity: Goal: Risk for activity intolerance will decrease Outcome: Adequate for Discharge   Problem: Nutrition: Goal: Adequate nutrition will be maintained Outcome: Adequate for Discharge   Problem: Coping: Goal: Level of anxiety will decrease Outcome: Adequate for Discharge   Problem: Elimination: Goal: Will not experience complications related to bowel motility Outcome: Adequate for Discharge Goal: Will not experience complications related to urinary retention Outcome: Adequate for Discharge   Problem: Pain Managment: Goal: General experience of comfort will improve Outcome: Adequate for Discharge   Problem: Safety: Goal: Ability to remain free from injury will improve Outcome: Adequate for Discharge   Problem: Skin Integrity: Goal: Risk for impaired skin integrity will decrease Outcome: Adequate for Discharge   Problem: Education: Goal: Ability to describe self-care measures that may prevent or decrease complications (Diabetes Survival Skills Education) will improve Outcome: Adequate for Discharge Goal: Individualized Educational Video(s) Outcome: Adequate for Discharge   Problem: Coping: Goal: Ability to adjust to condition or change in health will improve Outcome: Adequate for Discharge   Problem: Fluid Volume: Goal: Ability to maintain a balanced intake and output  will improve Outcome: Adequate for Discharge   Problem: Health Behavior/Discharge Planning: Goal: Ability to identify and utilize available resources and services will improve Outcome: Adequate for Discharge Goal: Ability to manage health-related needs will improve Outcome: Adequate for Discharge   Problem: Metabolic: Goal: Ability to maintain appropriate glucose levels will improve Outcome: Adequate for Discharge   Problem: Nutritional: Goal: Maintenance of adequate nutrition will improve Outcome: Adequate for Discharge Goal: Progress toward achieving an optimal weight will improve Outcome: Adequate for Discharge   Problem: Skin Integrity: Goal: Risk for impaired skin integrity will decrease Outcome: Adequate for Discharge   Problem: Tissue Perfusion: Goal: Adequacy of tissue perfusion will improve Outcome: Adequate for Discharge
